# Patient Record
Sex: Female | Born: 1950 | Race: White | Hispanic: No | Smoking: Never smoker
Health system: Southern US, Community
[De-identification: ages and names within clinical notes are randomized; demographics above are authoritative.]

## PROBLEM LIST (undated history)

## (undated) DIAGNOSIS — R011 Cardiac murmur, unspecified: Secondary | ICD-10-CM

## (undated) DIAGNOSIS — E083593 Diabetes mellitus due to underlying condition with proliferative diabetic retinopathy without macular edema, bilateral: Secondary | ICD-10-CM

## (undated) DIAGNOSIS — I340 Nonrheumatic mitral (valve) insufficiency: Secondary | ICD-10-CM

## (undated) DIAGNOSIS — I214 Non-ST elevation (NSTEMI) myocardial infarction: Secondary | ICD-10-CM

## (undated) DIAGNOSIS — F419 Anxiety disorder, unspecified: Secondary | ICD-10-CM

## (undated) DIAGNOSIS — R7989 Other specified abnormal findings of blood chemistry: Secondary | ICD-10-CM

## (undated) DIAGNOSIS — I2510 Atherosclerotic heart disease of native coronary artery without angina pectoris: Secondary | ICD-10-CM

## (undated) DIAGNOSIS — E11319 Type 2 diabetes mellitus with unspecified diabetic retinopathy without macular edema: Secondary | ICD-10-CM

## (undated) DIAGNOSIS — Z9289 Personal history of other medical treatment: Secondary | ICD-10-CM

## (undated) DIAGNOSIS — Z87442 Personal history of urinary calculi: Secondary | ICD-10-CM

## (undated) DIAGNOSIS — E109 Type 1 diabetes mellitus without complications: Secondary | ICD-10-CM

## (undated) DIAGNOSIS — I82451 Acute embolism and thrombosis of right peroneal vein: Secondary | ICD-10-CM

## (undated) DIAGNOSIS — M1990 Unspecified osteoarthritis, unspecified site: Secondary | ICD-10-CM

## (undated) DIAGNOSIS — I447 Left bundle-branch block, unspecified: Secondary | ICD-10-CM

## (undated) DIAGNOSIS — H409 Unspecified glaucoma: Secondary | ICD-10-CM

## (undated) DIAGNOSIS — Z951 Presence of aortocoronary bypass graft: Secondary | ICD-10-CM

## (undated) DIAGNOSIS — M810 Age-related osteoporosis without current pathological fracture: Secondary | ICD-10-CM

## (undated) HISTORY — DX: Type 1 diabetes mellitus without complications: E10.9

## (undated) HISTORY — DX: Atherosclerotic heart disease of native coronary artery without angina pectoris: I25.10

## (undated) HISTORY — DX: Non-ST elevation (NSTEMI) myocardial infarction: I21.4

## (undated) HISTORY — DX: Unspecified glaucoma: H40.9

## (undated) HISTORY — DX: Acute embolism and thrombosis of right peroneal vein: I82.451

## (undated) HISTORY — DX: Type 2 diabetes mellitus with unspecified diabetic retinopathy without macular edema: E11.319

## (undated) HISTORY — DX: Age-related osteoporosis without current pathological fracture: M81.0

## (undated) HISTORY — PX: CORONARY ARTERY BYPASS GRAFT: SHX141

## (undated) HISTORY — DX: Other specified abnormal findings of blood chemistry: R79.89

## (undated) HISTORY — DX: Diabetes mellitus due to underlying condition with proliferative diabetic retinopathy without macular edema, bilateral: E08.3593

## (undated) HISTORY — DX: Presence of aortocoronary bypass graft: Z95.1

## (undated) HISTORY — DX: Nonrheumatic mitral (valve) insufficiency: I34.0

---

## 1974-09-22 DIAGNOSIS — G629 Polyneuropathy, unspecified: Secondary | ICD-10-CM

## 1974-09-22 HISTORY — DX: Polyneuropathy, unspecified: G62.9

## 2010-09-22 DIAGNOSIS — Z951 Presence of aortocoronary bypass graft: Secondary | ICD-10-CM

## 2010-09-22 HISTORY — DX: Presence of aortocoronary bypass graft: Z95.1

## 2012-06-14 DIAGNOSIS — H35359 Cystoid macular degeneration, unspecified eye: Secondary | ICD-10-CM | POA: Insufficient documentation

## 2015-10-18 DIAGNOSIS — Z961 Presence of intraocular lens: Secondary | ICD-10-CM | POA: Insufficient documentation

## 2018-03-01 DIAGNOSIS — H04123 Dry eye syndrome of bilateral lacrimal glands: Secondary | ICD-10-CM | POA: Insufficient documentation

## 2019-09-23 DIAGNOSIS — E1110 Type 2 diabetes mellitus with ketoacidosis without coma: Secondary | ICD-10-CM

## 2019-09-23 HISTORY — DX: Type 2 diabetes mellitus with ketoacidosis without coma: E11.10

## 2019-10-31 DIAGNOSIS — H4010X0 Unspecified open-angle glaucoma, stage unspecified: Secondary | ICD-10-CM | POA: Insufficient documentation

## 2019-10-31 DIAGNOSIS — R0989 Other specified symptoms and signs involving the circulatory and respiratory systems: Secondary | ICD-10-CM | POA: Insufficient documentation

## 2019-10-31 DIAGNOSIS — I2510 Atherosclerotic heart disease of native coronary artery without angina pectoris: Secondary | ICD-10-CM | POA: Insufficient documentation

## 2020-07-18 DIAGNOSIS — Z794 Long term (current) use of insulin: Secondary | ICD-10-CM | POA: Insufficient documentation

## 2020-09-21 DIAGNOSIS — I214 Non-ST elevation (NSTEMI) myocardial infarction: Secondary | ICD-10-CM

## 2020-09-21 HISTORY — DX: Non-ST elevation (NSTEMI) myocardial infarction: I21.4

## 2020-10-05 DIAGNOSIS — I82451 Acute embolism and thrombosis of right peroneal vein: Secondary | ICD-10-CM

## 2020-10-05 HISTORY — DX: Acute embolism and thrombosis of right peroneal vein: I82.451

## 2020-10-17 DIAGNOSIS — I82451 Acute embolism and thrombosis of right peroneal vein: Secondary | ICD-10-CM | POA: Insufficient documentation

## 2021-01-17 DIAGNOSIS — I4729 Other ventricular tachycardia: Secondary | ICD-10-CM | POA: Insufficient documentation

## 2021-03-18 ENCOUNTER — Encounter: Payer: Self-pay | Admitting: Internal Medicine

## 2021-03-18 ENCOUNTER — Non-Acute Institutional Stay: Payer: Medicare Other | Admitting: Internal Medicine

## 2021-03-18 DIAGNOSIS — I214 Non-ST elevation (NSTEMI) myocardial infarction: Secondary | ICD-10-CM | POA: Diagnosis not present

## 2021-03-18 DIAGNOSIS — I255 Ischemic cardiomyopathy: Secondary | ICD-10-CM | POA: Diagnosis not present

## 2021-03-18 DIAGNOSIS — E1021 Type 1 diabetes mellitus with diabetic nephropathy: Secondary | ICD-10-CM

## 2021-03-18 DIAGNOSIS — E1059 Type 1 diabetes mellitus with other circulatory complications: Secondary | ICD-10-CM

## 2021-03-18 DIAGNOSIS — H401132 Primary open-angle glaucoma, bilateral, moderate stage: Secondary | ICD-10-CM

## 2021-03-18 DIAGNOSIS — E785 Hyperlipidemia, unspecified: Secondary | ICD-10-CM

## 2021-03-18 DIAGNOSIS — M810 Age-related osteoporosis without current pathological fracture: Secondary | ICD-10-CM

## 2021-03-18 NOTE — Progress Notes (Signed)
Location:   [facility] of Service:  ALF [phone]) Provider:  Einar Crow MD  No primary care provider on file.  Caitlin Sharp Care Team: [name], MD as Referring Physician (Ophthalmology) Beverely Pace, [name], MD as Referring Physician (Cardiology) Bertram Gala, MD as Referring Physician (Cardiology)  Extended Emergency Contact Information Primary Emergency Contact: [name] Address: [address]          [place], [place] [address] [name] of [place] Home Phone: [phone] Mobile Phone: [phone] Relation: Son Preferred language: English Interpreter needed? No  Code Status:  Full Code Goals of care: Advanced Directive information No flowsheet data found.   Chief Complaint  Caitlin Sharp presents with   Acute Visit    Establish care    HPI:  Caitlin Sharp is a 70 y.o. female seen today for an acute visit for Establish care and Dizziness  Was admitted in the hospital [place] in Jan of this year with DKA, NSTEMI, Enterococcus Bacremia, Acute DVT Cath no Stenosis Medical Management EF 40%  H/o Type 1 Diabetes For past 50 years. Has been on Pump but now Long Acting Insulin and Humalog now CAD S/o CABG in 2012 osteoporosis 2021 -2.7 Glaucoma with Poor Vision  Active issues today Dizziness Says she feels Dizzy when stands up. Feels like she is going to pass out Is Off Metoprolol right now which was new med for her since 01/22 Also her Lisinopril was increased from 2.5 mg to 10 mg High CBG CBG running high specially in the evenings sometimes more then 400 She says it is due to Food and how she cannot anticipate  She keeps her injections and manges her Shots with meals herself depending on what she is going to eat   Was living in home before this move One son in [place] and other is in [place] to avoid Hypoglycemic episodes Does not drive due to her Vision Otherwise independent in her ADLS No Falls. Walks with  noassists    Past Medical History:  Diagnosis Date   Abnormal thyroid blood test    Per Records recevied from [facility], Dr.Pearson   Acute deep vein thrombosis of right peroneal vein (HCC)    Per Records recevied from [facility], Dr.Pearson   Coronary artery disease    Per [facility] New Caitlin Sharp Packet   Diabetes type I [facility])    Per [facility] New Caitlin Sharp Packet   Diabetic retinopathy (HCC)    Per [facility] New Caitlin Sharp Packet   DKA (diabetic ketoacidosis) (HCC) 2021   Dr.William Zackowski   Glaucoma    Per [facility] New Caitlin Sharp Packet   H/O heart bypass surgery 2012   Dr. 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 Aquilla Hacker, Per [facility] New Caitlin Sharp Packet   CESAREAN SECTION  [phone]   Dr.  Aquilla Hacker, Per [facility] New Caitlin Sharp Packet    Allergies  Allergen Reactions   Penicillins     Per [facility] New Caitlin Sharp Packet    Allergies as of 03/18/2021       Reactions   Penicillins    Per [facility] New Caitlin Sharp Packet        Medication List        Accurate as of March 18, 2021 10:47 AM. If you have any questions, ask your nurse or doctor.          Accu-Chek Softclix Lancets lancets USE 1 TO CHECK GLUCOSE THREE TIMES DAILY   aspirin EC 81 MG tablet Take 81 mg by mouth daily. Swallow whole.   atorvastatin 40 MG tablet Commonly known as: LIPITOR Take 40 mg by mouth daily.   brimonidine 0.2 % ophthalmic solution Commonly  known as: ALPHAGAN Place 1 drop into both eyes 3 (three) times daily.   CALCIUM-CARB 600 PO Take 1 tablet by mouth daily.   clopidogrel 75 MG tablet Commonly known as: PLAVIX Take 75 mg by mouth daily.   furosemide 20 MG tablet Commonly known as: LASIX Take 20 mg by mouth daily.   glucosamine-chondroitin 500-400 MG tablet Take 1 tablet by mouth every morning.   glucose blood test strip 1 each by Other route as needed for other. Accu-chek   Gvoke HypoPen 1-Pack 1 MG/0.2ML Soaj Generic drug: Glucagon Inject 0.2 mLs into the skin as needed.   Hair/Skin/Nails Tabs Take 1 tablet by mouth daily at 12 noon.   insulin lispro 100 UNIT/ML KwikPen Commonly known as: HUMALOG Inject into the skin 3 (three) times daily. Up to 15 units   Lantus SoloStar 100 UNIT/ML Solostar Pen Generic drug: insulin glargine Inject 12 Units into the skin at bedtime.   latanoprost 0.005 % ophthalmic solution Commonly known as: XALATAN Place 1 drop into both eyes at bedtime.   lisinopril 10 MG tablet Commonly known as: ZESTRIL Take 10 mg by mouth daily.   metoprolol succinate 50 MG 24 hr tablet Commonly known as: TOPROL-XL Take 50 mg by mouth daily. Take with or immediately following a meal.   MULTIVITAMIN ADULT PO Take 1 tablet by mouth daily.   nitroGLYCERIN 0.4 MG SL tablet Commonly known as: NITROSTAT Place 0.4 mg under the tongue every 5 (five) minutes as needed for chest pain.   Pen Needles 3/16" 31G X 5 MM Misc 1 Device by Does not apply route 5 (five) times daily. With insulin administration   sennosides-docusate sodium 8.6-50 MG tablet Commonly known as: SENOKOT-S Take 1 tablet by mouth every other day.   timolol 0.5 % ophthalmic solution Commonly known as: BETIMOL Place 1 drop into the left eye 2 (two) times daily.   Vitamin D3 50 MCG (2000 UT) Tabs Take 1 tablet by mouth daily.        Review of Systems  Constitutional: Negative.   HENT: Negative.    Respiratory:  Negative.    Cardiovascular: Negative.   Gastrointestinal:  Positive for constipation.  Genitourinary: Negative.   Musculoskeletal: Negative.   Skin: Negative.   Neurological:  Positive for dizziness.  Psychiatric/Behavioral: Negative.     Immunization History  Administered Date(s) Administered   Influenza, High Dose Seasonal PF 09/01/2005   PFIZER(Purple Top)SARS-COV-2 Vaccination 12/02/2019, 12/26/2019   Tdap 07/11/2014   Pertinent  Health Maintenance Due  Topic Date Due   COLONOSCOPY (Pts 45-43yrs Insurance coverage will need to be confirmed)  Never done   MAMMOGRAM  Never  done   DEXA SCAN  Never done   PNA vac Low Risk Adult (1 of 2 - PCV13) Never done   INFLUENZA VACCINE  04/22/2021   No flowsheet data found. Functional Status Survey:    Vitals:   03/18/21 1038  BP: 117/62  Pulse: 69  Resp: 18  Temp: 97.7 F (36.5 C)  SpO2: 98%  Weight: 126 lb 3.2 oz (57.2 kg)   There is no height or weight on file to calculate BMI. Physical Exam Constitutional: Oriented to person, place, and time. Well-developed and well-nourished.  HENT:  Head: Normocephalic.  Mouth/Throat: Oropharynx is clear and moist.  Eyes: Pupils are equal, round, and reactive to light.  Neck: Neck supple.  Cardiovascular: Normal rate and normal heart sounds.  No murmur heard. Pulmonary/Chest: Effort normal and breath sounds normal. No respiratory distress. No wheezes. She has no rales.  Abdominal: Soft. Bowel sounds are normal. No distension. There is no tenderness. There is no rebound.  Musculoskeletal: No edema.  Lymphadenopathy: none Neurological: Alert and oriented to person, place, and time.  No Focal Deficits Gait was stable Skin: Skin is warm and dry.  Psychiatric: Normal mood and affect. Behavior is normal. Thought content normal.   Labs reviewed: No results for input(s): NA, K, CL, CO2, GLUCOSE, BUN, CREATININE, CALCIUM, MG, PHOS in the last 8760 hours. No results for input(s): AST,  ALT, ALKPHOS, BILITOT, PROT, ALBUMIN in the last 8760 hours. No results for input(s): WBC, NEUTROABS, HGB, HCT, MCV, PLT in the last 8760 hours. No results found for: TSH No results found for: HGBA1C No results found for: CHOL, HDL, LDLCALC, LDLDIRECT, TRIG, CHOLHDL  Significant Diagnostic Results in last 30 days:  No results found.  Assessment/Plan  Type 1 diabetes mellitus with other circulatory complication (HCC) On Lantus and Humalog with Meals Fasting sugars 95-135 Some sugars in the evening more then 350 Having issues to control due to unpredictable Diet in facility Managing by herself her Insulin Has Free Liber Has Appointment with her Endocrinologist in [facility]  NSTEMI (non-ST elevated myocardial infarction) (HCC)in Jan in setting of DKA Per Notes from her PCP Is on Dual Therapy Cardiac Cath was negative Needs follow up with her Cardiology  Dizziness With SBP running in 90 Reduce Lisinopril to 2.5 mg Qd Her baseline dose She is on Lisinopril for 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

## 2021-03-19 LAB — CBC AND DIFFERENTIAL
HCT: 32 — AB (ref 36–46)
Hemoglobin: 11.4 — AB (ref 12.0–16.0)
Platelets: 280 (ref 150–399)
WBC: 5.4

## 2021-03-19 LAB — CBC: RBC: 3.58 — AB (ref 3.87–5.11)

## 2021-03-19 LAB — COMPREHENSIVE METABOLIC PANEL
Albumin: 4.4 (ref 3.5–5.0)
Calcium: 9.8 (ref 8.7–10.7)
Globulin: 2.7

## 2021-03-19 LAB — BASIC METABOLIC PANEL
BUN: 35 — AB (ref 4–21)
CO2: 27 — AB (ref 13–22)
Chloride: 101 (ref 99–108)
Creatinine: 1 (ref 0.5–1.1)
Glucose: 164
Potassium: 4.1 (ref 3.4–5.3)
Sodium: 142 (ref 137–147)

## 2021-03-19 LAB — LIPID PANEL
Cholesterol: 179 (ref 0–200)
HDL: 83 — AB (ref 35–70)
LDL Cholesterol: 86
LDl/HDL Ratio: 2.2
Triglycerides: 52 (ref 40–160)

## 2021-03-19 LAB — HEPATIC FUNCTION PANEL
ALT: 24 (ref 7–35)
AST: 29 (ref 13–35)
Alkaline Phosphatase: 99 (ref 25–125)
Bilirubin, Total: 0.2

## 2021-03-19 LAB — HEMOGLOBIN A1C: Hemoglobin A1C: 9.4

## 2021-03-26 ENCOUNTER — Encounter: Payer: Self-pay | Admitting: Orthopedic Surgery

## 2021-03-26 ENCOUNTER — Non-Acute Institutional Stay: Payer: Medicare Other | Admitting: Orthopedic Surgery

## 2021-03-26 DIAGNOSIS — R42 Dizziness and giddiness: Secondary | ICD-10-CM

## 2021-03-26 NOTE — Addendum Note (Signed)
Addended byHazle Nordmann E on: 03/26/2021 08:53 PM   Modules accepted: Level of Service

## 2021-03-26 NOTE — Progress Notes (Addendum)
Location:   [facility] Room Number: 620 Place of Service:  ALF [phone]) Provider:  Hazle Nordmann, NP  No primary care provider on file.  Patient Care Team: [name], MD as Referring Physician (Ophthalmology) Beverely Pace, [name], MD as Referring Physician (Cardiology) Bertram Gala, MD as Referring Physician (Cardiology)  Extended Emergency Contact Information Primary Emergency Contact: [name] Address: [address]          [place], [place] [address] [name] of [place] Home Phone: [phone] Mobile Phone: [phone] Relation: Son Preferred language: English Interpreter needed? No  Code Status:  Full Code Goals of care: Advanced Directive information No flowsheet data found.   Chief Complaint  Patient presents with   Acute Visit    Dizziness    HPI:  Pt is a 70 y.o. female seen today for an acute visit due to dizziness.   She currently resides on the assisted living unit at [facility]. Past medical history includes: type 1 diabetes, proteinuria, NSTEMI, cardiomyopathy, glaucoma, hyperlipidemia, DVT, and osteoporosis.   Since seeing Dr. Chales Abrahams 06/27, she complains of ongoing dizziness. She fell yesterday at the nurses station while waiting to receive her eye drops. Reports feeling dizziness before she fell. She has been advised to stand for a few seconds and then move. No recent injuries have occurred. Metoprolol recently discontinued. Lisinopril was reduced from 10 mg to 2.5 mg due to proteinuria. She continues to take lasix 20 mg daily.   She is followed by cardiology Dr. Bertram Gala,  unknown when her next follow up is.   Recent blood pressures are as follows:  07/04- 84/57 (standing), 105/64 (sitting)  07/03- 109/49  06/26- 117/62, 90/67  Nurse does not report any concerns, vitals stable.   Plan of care discussed with son, Harrold Donath. He shares his mother took a long walk with him yesterday prior to falling.  Wondering if she may be dehydrated. Reports she does not drink enough water daily.      Past Medical History:  Diagnosis Date   Abnormal thyroid blood test    Per Records recevied from [facility], Dr.Pearson   Acute deep vein thrombosis of right peroneal vein (HCC)    Per Records recevied from [facility], Dr.Pearson   Coronary artery disease    Per [facility] New Patient Packet   Diabetes type I [facility])    Per [facility] New Patient Packet   Diabetic retinopathy (HCC)    [facility] New Patient Packet   DKA (diabetic ketoacidosis) (HCC) 2021   Dr.William Zackowski   Glaucoma    Per [facility] New Patient Packet   H/O heart bypass surgery 2012   Dr. 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 Aquilla Hacker, Per [facility] New Patient Packet   CESAREAN SECTION  1973   Dr. Renae Fickle  [place], Per [facility] New Patient Packet    Allergies  Allergen Reactions   Penicillins     Per [facility] New Patient Packet    Allergies as of 03/26/2021       Reactions   Penicillins    Per [facility] New Patient Packet        Medication List        Accurate as of March 26, 2021 11:19 AM. If you have any questions, ask your nurse or doctor.          Accu-Chek Softclix Lancets lancets USE 1 TO CHECK GLUCOSE THREE TIMES DAILY   aspirin EC 81 MG tablet Take 81 mg by mouth daily. Swallow whole.   atorvastatin 40 MG tablet Commonly known as: LIPITOR Take 40  mg by mouth daily.   brimonidine 0.2 % ophthalmic solution Commonly known as: ALPHAGAN Place 1 drop into both eyes 3 (three) times daily.   CALCIUM-CARB 600 PO Take 1 tablet by mouth daily.   clopidogrel 75 MG tablet Commonly known as: PLAVIX Take 75 mg by mouth daily.   furosemide 20 MG tablet Commonly known as: LASIX Take 20 mg by mouth daily.   glucosamine-chondroitin 500-400 MG tablet Take 1 tablet by mouth every morning.   glucose blood test strip 1 each by Other route as needed for other. Accu-chek   Gvoke HypoPen 1-Pack 1 MG/0.2ML Soaj Generic drug: Glucagon Inject 0.2 mLs into the skin as needed.   Hair/Skin/Nails Tabs Take 1 tablet by mouth daily at 12 noon.   insulin lispro 100 UNIT/ML KwikPen Commonly known as: HUMALOG Inject 2-10 Units into the skin 3 (three) times daily.   Lantus SoloStar 100 UNIT/ML Solostar Pen Generic drug: insulin glargine Inject 12 Units into the skin at bedtime.   latanoprost 0.005 % ophthalmic solution Commonly known as: XALATAN Place 1 drop into both eyes at bedtime.   lisinopril 2.5 MG tablet Commonly known as: ZESTRIL Take 2.5 mg by mouth daily.   MULTIVITAMIN ADULT PO Take 1 tablet by mouth daily.   nitroGLYCERIN 0.4 MG SL tablet Commonly known as: NITROSTAT Place 0.4 mg under the tongue every 5 (five) minutes as needed for chest pain.   Pen Needles 3/16" 31G X 5 MM Misc 1 Device by Does not apply route 5 (five) times daily. With insulin administration   sennosides-docusate sodium 8.6-50 MG tablet Commonly known as: SENOKOT-S Take 1 tablet by mouth every other day.   timolol 0.5 % ophthalmic solution Commonly known as: BETIMOL Place 1 drop into the left eye 2 (two) times daily.   Vitamin D3 125 MCG (5000 UT) Tabs Take 1 tablet by mouth daily.        Review of Systems  Constitutional:  Negative for activity change, fatigue and fever.  Respiratory:  Negative for cough, shortness of breath and wheezing.    Cardiovascular:  Negative for chest pain and leg swelling.  Gastrointestinal:  Negative for blood in stool.  Genitourinary:  Negative for hematuria.  Musculoskeletal:  Negative for gait problem.  Neurological:  Positive for dizziness, weakness and light-headedness.  Psychiatric/Behavioral:  Negative for dysphoric mood. The patient is not nervous/anxious.    Immunization History  Administered Date(s) Administered   Influenza, High Dose Seasonal PF 09/01/2005   PFIZER(Purple Top)SARS-COV-2 Vaccination 12/02/2019, 12/26/2019   Tdap 07/11/2014   Pertinent  Health Maintenance Due  Topic Date Due   COLONOSCOPY (Pts 45-65yrs Insurance coverage will need to be confirmed)  Never done   MAMMOGRAM  Never done  DEXA SCAN  Never done   PNA vac Low Risk Adult (1 of 2 - PCV13) Never done   INFLUENZA VACCINE  04/22/2021   No flowsheet data found. Functional Status Survey:    Vitals:   03/26/21 1113  BP: (!) 84/57  Pulse: 76  Temp: (!) 97.5 F (36.4 C)  SpO2: 98%  Weight: 125 lb 12.8 oz (57.1 kg)   There is no height or weight on file to calculate BMI. Physical Exam Vitals reviewed.  Constitutional:      General: She is not in acute distress. Neck:     Vascular: No carotid bruit.  Cardiovascular:     Rate and Rhythm: Normal rate and regular rhythm.     Pulses: Normal pulses.     Heart sounds: Normal heart sounds. No murmur heard. Pulmonary:     Effort: Pulmonary effort is normal. No respiratory distress.     Breath sounds: Normal breath sounds. No wheezing.  Musculoskeletal:     Right lower leg: No edema.     Left lower leg: No edema.  Skin:    General: Skin is warm and dry.     Capillary Refill: Capillary refill takes less than 2 seconds.  Neurological:     General: No focal deficit present.     Mental Status: She is alert and oriented to person, place, and time.  Psychiatric:        Mood and Affect: Mood normal.        Behavior: Behavior normal.    Labs  reviewed: Recent Labs    03/19/21 0000  NA 142  K 4.1  CL 101  CO2 27*  BUN 35*  CREATININE 1.0  CALCIUM 9.8   Recent Labs    03/19/21 0000  AST 29  ALT 24  ALKPHOS 99  ALBUMIN 4.4   Recent Labs    03/19/21 0000  WBC 5.4  HGB 11.4*  HCT 32*  PLT 280   No results found for: TSH Lab Results  Component Value Date   HGBA1C 9.4 03/19/2021   Lab Results  Component Value Date   CHOL 179 03/19/2021   HDL 83 (A) 03/19/2021   LDLCALC 86 03/19/2021   TRIG 52 03/19/2021    Significant Diagnostic Results in last 30 days:  No results found.  Assessment/Plan 1. Dizziness - hgb 11.4 03/19/2021, no signs of bleeding - metoprolol discontinued recently - lisinopril reduced to 2.5 mg daily - suspect she may be dehydrated with diuretic - recommend hydration throughout the day - recommend f/u with Dr. Karren Burly   - cont to take bp bid- please take AM bp 2 hours after receiving medications    Family/ staff Communication: plan discussed with patient, son and nurse  Labs/tests ordered:  none

## 2021-04-13 ENCOUNTER — Emergency Department (HOSPITAL_BASED_OUTPATIENT_CLINIC_OR_DEPARTMENT_OTHER): Payer: Medicare Other

## 2021-04-13 ENCOUNTER — Other Ambulatory Visit: Payer: Self-pay

## 2021-04-13 ENCOUNTER — Encounter (HOSPITAL_BASED_OUTPATIENT_CLINIC_OR_DEPARTMENT_OTHER): Payer: Self-pay | Admitting: Urology

## 2021-04-13 ENCOUNTER — Emergency Department (HOSPITAL_BASED_OUTPATIENT_CLINIC_OR_DEPARTMENT_OTHER)
Admission: EM | Admit: 2021-04-13 | Discharge: 2021-04-13 | Disposition: A | Discharge status: Home / Self Care | Payer: Medicare Other | Attending: Emergency Medicine | Admitting: Emergency Medicine

## 2021-04-13 ENCOUNTER — Telehealth: Payer: Self-pay | Admitting: Adult Health

## 2021-04-13 DIAGNOSIS — Z951 Presence of aortocoronary bypass graft: Secondary | ICD-10-CM | POA: Diagnosis not present

## 2021-04-13 DIAGNOSIS — L03031 Cellulitis of right toe: Secondary | ICD-10-CM | POA: Insufficient documentation

## 2021-04-13 DIAGNOSIS — Z794 Long term (current) use of insulin: Secondary | ICD-10-CM | POA: Insufficient documentation

## 2021-04-13 DIAGNOSIS — Z7982 Long term (current) use of aspirin: Secondary | ICD-10-CM | POA: Insufficient documentation

## 2021-04-13 DIAGNOSIS — E10319 Type 1 diabetes mellitus with unspecified diabetic retinopathy without macular edema: Secondary | ICD-10-CM | POA: Diagnosis not present

## 2021-04-13 DIAGNOSIS — I2510 Atherosclerotic heart disease of native coronary artery without angina pectoris: Secondary | ICD-10-CM | POA: Insufficient documentation

## 2021-04-13 DIAGNOSIS — M86171 Other acute osteomyelitis, right ankle and foot: Secondary | ICD-10-CM | POA: Diagnosis not present

## 2021-04-13 DIAGNOSIS — L97514 Non-pressure chronic ulcer of other part of right foot with necrosis of bone: Secondary | ICD-10-CM | POA: Diagnosis not present

## 2021-04-13 LAB — CBC WITH DIFFERENTIAL/PLATELET
Abs Immature Granulocytes: 0.01 10*3/uL (ref 0.00–0.07)
Basophils Absolute: 0 10*3/uL (ref 0.0–0.1)
Basophils Relative: 1 %
Eosinophils Absolute: 0.3 10*3/uL (ref 0.0–0.5)
Eosinophils Relative: 3 %
HCT: 32 % — ABNORMAL LOW (ref 36.0–46.0)
Hemoglobin: 10.7 g/dL — ABNORMAL LOW (ref 12.0–15.0)
Immature Granulocytes: 0 %
Lymphocytes Relative: 28 %
Lymphs Abs: 2.1 10*3/uL (ref 0.7–4.0)
MCH: 30.4 pg (ref 26.0–34.0)
MCHC: 33.4 g/dL (ref 30.0–36.0)
MCV: 90.9 fL (ref 80.0–100.0)
Monocytes Absolute: 0.8 10*3/uL (ref 0.1–1.0)
Monocytes Relative: 10 %
Neutro Abs: 4.4 10*3/uL (ref 1.7–7.7)
Neutrophils Relative %: 58 %
Platelets: 331 10*3/uL (ref 150–400)
RBC: 3.52 MIL/uL — ABNORMAL LOW (ref 3.87–5.11)
RDW: 12.6 % (ref 11.5–15.5)
WBC: 7.5 10*3/uL (ref 4.0–10.5)
nRBC: 0 % (ref 0.0–0.2)

## 2021-04-13 LAB — COMPREHENSIVE METABOLIC PANEL
ALT: 9 U/L (ref 0–44)
AST: 19 U/L (ref 15–41)
Albumin: 3.8 g/dL (ref 3.5–5.0)
Alkaline Phosphatase: 79 U/L (ref 38–126)
Anion gap: 11 (ref 5–15)
BUN: 31 mg/dL — ABNORMAL HIGH (ref 8–23)
CO2: 26 mmol/L (ref 22–32)
Calcium: 9.5 mg/dL (ref 8.9–10.3)
Chloride: 100 mmol/L (ref 98–111)
Creatinine, Ser: 1.03 mg/dL — ABNORMAL HIGH (ref 0.44–1.00)
GFR, Estimated: 58 mL/min — ABNORMAL LOW (ref >60)
Glucose, Bld: 155 mg/dL — ABNORMAL HIGH (ref 70–99)
Potassium: 4.1 mmol/L (ref 3.5–5.1)
Sodium: 137 mmol/L (ref 135–145)
Total Bilirubin: 0.4 mg/dL (ref 0.3–1.2)
Total Protein: 7.3 g/dL (ref 6.5–8.1)

## 2021-04-13 LAB — LACTIC ACID, PLASMA: Lactic Acid, Venous: 1.1 mmol/L (ref 0.5–1.9)

## 2021-04-13 MED ORDER — DOXYCYCLINE HYCLATE 100 MG PO CAPS: 100.0000 mg | ORAL_CAPSULE | Freq: Two times a day (BID) | ORAL | 0 refills | Status: DC

## 2021-04-13 MED ORDER — SODIUM CHLORIDE 0.9 % IV SOLN
2.0000 g | Freq: Once | INTRAVENOUS | Status: AC
  Administered 2021-04-13: 2 g via INTRAVENOUS
  Filled 2021-04-13: qty 2

## 2021-04-13 MED ORDER — VANCOMYCIN HCL 750 MG/150ML IV SOLN
750.0000 mg | INTRAVENOUS | Status: DC
  Filled 2021-04-13: qty 150

## 2021-04-13 MED ORDER — VANCOMYCIN HCL IN DEXTROSE 1-5 GM/200ML-% IV SOLN
1000.0000 mg | Freq: Once | INTRAVENOUS | Status: AC
  Administered 2021-04-13: 1000 mg via INTRAVENOUS
  Filled 2021-04-13: qty 200

## 2021-04-13 MED ORDER — SODIUM CHLORIDE 0.9 % IV SOLN: 2.0000 g | Freq: Two times a day (BID) | INTRAVENOUS | Status: DC

## 2021-04-13 MED ORDER — DOXYCYCLINE HYCLATE 100 MG PO CAPS
100.0000 mg | ORAL_CAPSULE | Freq: Two times a day (BID) | ORAL | 0 refills | Status: DC
Start: 2021-04-13 — End: 2021-04-23

## 2021-04-13 MED ORDER — SODIUM CHLORIDE 0.9 % IV SOLN
INTRAVENOUS | Status: DC | PRN
  Administered 2021-04-13: 500 mL via INTRAVENOUS

## 2021-04-13 MED ORDER — METRONIDAZOLE 500 MG/100ML IV SOLN
500.0000 mg | Freq: Once | INTRAVENOUS | Status: AC
  Administered 2021-04-13: 500 mg via INTRAVENOUS
  Filled 2021-04-13: qty 100

## 2021-04-13 NOTE — ED Notes (Signed)
Pt denies n/v/d. Pt denies trauma. Pt states wound "popped" up about 2 weeks ago.

## 2021-04-13 NOTE — Telephone Encounter (Signed)
Nurse [name] called from [facility] to report that Ms. B has a wound on her toe that is black with spreading redness and swelling up the foot. She does not have a fever and the area is not purulent. She is a Type 1 diabetic.  After a discussion with Dr. Chales Abrahams we feel it would be best if she goes to the ED to receive an evaluation and possible IV antibiotics along with possible debridement and referral to wound care if necessary depending upon their assessment.

## 2021-04-13 NOTE — ED Triage Notes (Signed)
Right great toe wound, purulent drainage and necrotic tissue noted, h/o DM.

## 2021-04-13 NOTE — Progress Notes (Signed)
Pharmacy Antibiotic Note  Caitlin Sharp is a 70 y.o. female admitted on 04/13/2021 presenting with necrotic toe wound with purulent drainage.  Pharmacy has been consulted for Vancomycin and cefepime dosing.  Plan: Vancomycin 1000 mg IV x 1, then 750 mg IV q 24h (eAUC 437, Goal AUC 400-550, SCr 1.03) Cefepime 2g IV q 12h Monitor renal function, Cx and clinical progression to narrow Vancomycin levels as needed  Height: 5\' 7"  (170.2 cm) Weight: 57.1 kg (125 lb 12.8 oz) IBW/kg (Calculated) : 61.6  Temp (24hrs), Avg:98.1 F (36.7 C), Min:98.1 F (36.7 C), Max:98.1 F (36.7 C)  Recent Labs  Lab 04/13/21 1825  WBC 7.5  CREATININE 1.03*  LATICACIDVEN 1.1    Estimated Creatinine Clearance: 45.2 mL/min (A) (by C-G formula based on SCr of 1.03 mg/dL (H)).    Allergies  Allergen Reactions   Penicillins     Per [facility] New Patient Packet    [facility], PharmD Clinical Pharmacist ED Pharmacist Phone # [phone] 04/13/2021 7:52 PM

## 2021-04-14 NOTE — ED Provider Notes (Signed)
[facility] EMERGENCY DEPT Provider Note   CSN: [id number] Arrival date & time: 04/13/21  1533     History Chief Complaint  Patient presents with   Wound Infection    Caitlin Sharp is a 70 y.o. female.  HPI     70yo female with history of DVT, CAD, DM type 1, diabetic retinopathy, present with concern for right great toe wound.   Reports she developed a blister 2 weeks ago she believes from ill-fitting shoes and walking  Has had some mild pain over the area. Hs not noticed and drainage of purulence. Today, it suddenly appeared worse, black in appearance, more painful.  Denis fevers, nausea, vomiting, fatigue or systemic symptoms. No hx of prior foot infections.   Past Medical History:  Diagnosis Date   Abnormal thyroid blood test    Per Records recevied from [facility], Dr.Pearson   Acute deep vein thrombosis of right peroneal vein (HCC)    Per Records recevied from [facility], Dr.Pearson   Coronary artery disease    Per [facility] New Patient Packet   Diabetes type I [facility])    Per [facility] New Patient Packet   Diabetic retinopathy (HCC)    Per [facility] New Patient Packet   DKA (diabetic ketoacidosis) (HCC) 2021   Dr.William Zackowski   Glaucoma    Per [facility] New Patient Packet   H/O heart bypass surgery 2012   Dr. 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   Past Surgical History:  Procedure Laterality Date    CESAREAN SECTION  1970   Dr. Aquilla Hacker, Per [facility] New Patient Packet   CESAREAN SECTION  1973   Dr. Aquilla Hacker, Per [facility] New Patient Packet     OB History   No obstetric history on file.     Family History  Problem Relation Age of Onset   Congestive Heart Failure Mother        Per [facility] New Patient Packet   Arthritis Mother        Per [facility] New Patient Packet   Osteoporosis Mother        Per [facility] New Patient Packet   Heart attack Father        Per [facility] New Patient Packet   Diabetes Sister        Per [facility] New Patient Packet   Emphysema Brother        Per [facility] New Patient Packet   Heart disease Brother        Per [facility] New Patient Packet    Social History   Tobacco Use   Smoking status: Never   Smokeless tobacco: Never  Vaping Use   Vaping Use: Never used  Substance Use Topics   Alcohol use: Never   Drug use: Never    Home Medications Prior to Admission medications   Medication Sig  Start Date End Date Taking? Authorizing Provider  aspirin EC 81 MG tablet Take 81 mg by mouth daily. Swallow whole.   Yes [provider]  atorvastatin (LIPITOR) 40 MG tablet Take 40 mg by mouth daily.   Yes [provider]  brimonidine (ALPHAGAN) 0.2 % ophthalmic solution Place 1 drop into both eyes 3 (three) times daily.   Yes [provider]  Cholecalciferol (VITAMIN D3) 125 MCG (5000 UT) TABS Take 1 tablet by mouth daily.   Yes [provider]  clopidogrel (PLAVIX) 75 MG tablet Take 75 mg by mouth daily.   Yes [provider]  furosemide (LASIX) 20 MG tablet Take 20 mg by mouth daily.   Yes [provider]  Glucagon (GVOKE HYPOPEN 1-PACK) 1 MG/0.2ML SOAJ Inject 0.2 mLs into the skin as needed.   Yes [provider]  glucosamine-chondroitin 500-400 MG tablet Take 1 tablet by mouth every morning.   Yes [provider]  glucose blood test strip 1 each by Other route as needed for other. Accu-chek   Yes [provider]   insulin glargine (LANTUS SOLOSTAR) 100 UNIT/ML Solostar Pen Inject 12 Units into the skin at bedtime.   Yes [provider]  insulin lispro (HUMALOG) 100 UNIT/ML KwikPen Inject 2-10 Units into the skin 3 (three) times daily.   Yes [provider]  Insulin Pen Needle (PEN NEEDLES 3/16") 31G X 5 MM MISC 1 Device by Does not apply route 5 (five) times daily. With insulin administration   Yes [provider]  latanoprost (XALATAN) 0.005 % ophthalmic solution Place 1 drop into both eyes at bedtime.   Yes [provider]  lisinopril (ZESTRIL) 2.5 MG tablet Take 2.5 mg by mouth daily.   Yes [provider]  Multiple Vitamin (MULTIVITAMIN ADULT PO) Take 1 tablet by mouth daily.   Yes [provider]  Multiple Vitamins-Minerals (HAIR/SKIN/NAILS) TABS Take 1 tablet by mouth daily at 12 noon.   Yes [provider]  sennosides-docusate sodium (SENOKOT-S) 8.6-50 MG tablet Take 1 tablet by mouth every other day.   Yes [provider]  timolol (BETIMOL) 0.5 % ophthalmic solution Place 1 drop into the left eye 2 (two) times daily.   Yes [provider]  Accu-Chek Softclix Lancets lancets USE 1 TO CHECK GLUCOSE THREE TIMES DAILY 10/12/20   [provider]  Calcium Carbonate (CALCIUM-CARB 600 PO) Take 1 tablet by mouth daily.    [provider]  doxycycline (VIBRAMYCIN) 100 MG capsule Take 1 capsule (100 mg total) by mouth 2 (two) times daily. 04/13/21   Alvira Monday, MD  nitroGLYCERIN (NITROSTAT) 0.4 MG SL tablet Place 0.4 mg under the tongue every 5 (five) minutes as needed for chest pain.    [provider]    Allergies    Penicillins  Review of Systems   Review of Systems  Constitutional:  Negative for appetite change, fatigue and fever.  Eyes:  Negative for visual disturbance.  Respiratory:  Negative for cough.   Gastrointestinal:  Positive for diarrhea. Negative for abdominal pain, nausea and  vomiting.  Musculoskeletal:  Positive for arthralgias.  Skin:  Positive for color change, rash and wound.  Neurological:  Negative for light-headedness and headaches.   Physical Exam Updated Vital Signs BP (!) 159/62 (BP Location: Right Arm)   Pulse 76   Temp 98.1 F (36.7 C)   Resp 18   Ht 5\' 7"  (1.702 m)   Wt 57.1 kg   SpO2 100%  BMI 19.70 kg/m   Physical Exam Vitals and nursing note reviewed.  Constitutional:      General: She is not in acute distress.    Appearance: She is well-developed. She is not diaphoretic.  HENT:     Head: Normocephalic and atraumatic.  Eyes:     Conjunctiva/sclera: Conjunctivae normal.  Cardiovascular:     Rate and Rhythm: Normal rate and regular rhythm.     Heart sounds: Normal heart sounds. No murmur heard.   No friction rub. No gallop.  Pulmonary:     Effort: Pulmonary effort is normal. No respiratory distress.     Breath sounds: Normal breath sounds. No wheezing or rales.  Abdominal:     General: There is no distension.     Palpations: Abdomen is soft.     Tenderness: There is no abdominal tenderness. There is no guarding.  Musculoskeletal:        General: No tenderness.     Cervical back: Normal range of motion.     Comments: Doppler signal easily found ,normal cap refill  Skin:    General: Skin is warm and dry.     Findings: Erythema (see photo,balck necrotic distal toe, unable to express purulence, erythema of great toe, mild extension onto dorsum of foot) present. No rash.  Neurological:     Mental Status: She is alert and oriented to person, place, and time.       ED Results / Procedures / Treatments   Labs (all labs ordered are listed, but only abnormal results are displayed) Labs Reviewed  CBC WITH DIFFERENTIAL/PLATELET - Abnormal; Notable for the following components:      Result Value   RBC 3.52 (*)    Hemoglobin 10.7 (*)    HCT 32.0 (*)    All other components within normal limits  COMPREHENSIVE METABOLIC PANEL -  Abnormal; Notable for the following components:   Glucose, Bld 155 (*)    BUN 31 (*)    Creatinine, Ser 1.03 (*)    GFR, Estimated 58 (*)    All other components within normal limits  CULTURE, BLOOD (ROUTINE X 2)  CULTURE, BLOOD (ROUTINE X 2)  LACTIC ACID, PLASMA    EKG None  Radiology DG Foot Complete Right  Result Date: 04/13/2021 CLINICAL DATA:  Right great toe wound with. Early drainage. Diabetic patient. EXAM: RIGHT FOOT COMPLETE - 3+ VIEW COMPARISON:  None. FINDINGS: There is resorption of portions of the distal tuft of the right great toe consistent with osteomyelitis. No fracture.  No bone lesion.  Joints are normally aligned. Soft tissue swelling is noted of the distal great toe. Tiny bubbles of air are seen along the distal aspect of the great toe adjacent to the partly resorbed distal tuft. IMPRESSION: 1. Osteomyelitis of the distal tuft of the right great toe with surrounding soft tissue swelling and a few tiny bubbles of soft tissue air. Electronically Signed   By: Amie Portland M.D.   On: 04/13/2021 18:40    Procedures Procedures   Medications Ordered in ED Medications  metroNIDAZOLE (FLAGYL) IVPB 500 mg (0 mg Intravenous Stopped 04/13/21 2129)  ceFEPIme (MAXIPIME) 2 g in sodium chloride 0.9 % 100 mL IVPB (0 g Intravenous Stopped 04/13/21 1925)  vancomycin (VANCOCIN) IVPB 1000 mg/200 mL premix (0 mg Intravenous Stopped 04/13/21 2129)    ED Course  I have reviewed the triage vital signs and the nursing notes.  Pertinent labs & imaging results that were available during my care of the  patient were reviewed by me and considered in my medical decision making (see chart for details).    MDM Rules/Calculators/A&P                            70yo female with history of DVT, CAD, DM type 1, diabetic retinopathy, present with concern for right great toe wound.   Hemodynamically stable, no systemic symptoms, no leukocytosis, normal lactic acid, no signs of sepsis.    Has  necrotic foot wound with osteomyelitis of distal tuft of right great toe.  Mild cellulitis of toe and erythema of part of dorsum off oot which she reports has been there chronically.  Discussed with Orthopedics on call, Dr. August Saucer.   Gave vancomycin, cefepime and flagyl for diabetic wound infection/osteomyelitis.  Discussed possible admission vs close outpatient follow up the day after tomorrow with Dr. Lajoyce Corners and strict return precautions with patient and Dr. August Saucer. She has not yet been on antibiotics prior to this evaluation. Given her stability, no sepsis, mild redness, feel it is not unreasonable for her to receive IV antibiotics tonight with oral abx tomorrow and seeing Dr. Lajoyce Corners on Monday.  Outlined area of erythema and gave strict return precautions with very low threshold for return and inpatient abx and admission.  Patient discharged in stable condition with understanding of reasons to return.   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

## 2021-04-15 ENCOUNTER — Ambulatory Visit: Payer: Medicare Other | Admitting: Orthopedic Surgery

## 2021-04-15 DIAGNOSIS — M869 Osteomyelitis, unspecified: Secondary | ICD-10-CM

## 2021-04-16 ENCOUNTER — Other Ambulatory Visit: Payer: Self-pay

## 2021-04-16 ENCOUNTER — Encounter (HOSPITAL_COMMUNITY): Payer: Self-pay | Admitting: Orthopedic Surgery

## 2021-04-16 ENCOUNTER — Encounter: Payer: Self-pay | Admitting: Orthopedic Surgery

## 2021-04-16 NOTE — Anesthesia Preprocedure Evaluation (Addendum)
Anesthesia Evaluation  Patient identified by MRN, date of birth, ID band Patient awake    Reviewed: Allergy & Precautions, NPO status , Patient's Chart, lab work & pertinent test results  Airway Mallampati: II  TM Distance: >3 FB Neck ROM: Full    Dental  (+) Teeth Intact   Pulmonary neg pulmonary ROS,    Pulmonary exam normal        Cardiovascular hypertension, Pt. on medications + CAD, + Past MI, + CABG (2012) and + DVT  + Valvular Problems/Murmurs MR  Rhythm:Regular Rate:Normal     Neuro/Psych Anxiety negative neurological ROS     GI/Hepatic negative GI ROS, Neg liver ROS,   Endo/Other  diabetes, Type 1, Insulin Dependent  Renal/GU negative Renal ROS  negative genitourinary   Musculoskeletal  (+) Arthritis , Osteoarthritis,    Abdominal (+)  Abdomen: soft. Bowel sounds: normal.  Peds  Hematology negative hematology ROS (+)   Anesthesia Other Findings   Reproductive/Obstetrics                            Anesthesia Physical Anesthesia Plan  ASA: 3  Anesthesia Plan: MAC   Post-op Pain Management:    Induction: Intravenous  PONV Risk Score and Plan: 2 and Midazolam, Propofol infusion, Ondansetron and Treatment may vary due to age or medical condition  Airway Management Planned: Simple Face Mask, Natural Airway and Nasal Cannula  Additional Equipment: None  Intra-op Plan:   Post-operative Plan:   Informed Consent: I have reviewed the patients History and Physical, chart, labs and discussed the procedure including the risks, benefits and alternatives for the proposed anesthesia with the patient or authorized representative who has indicated his/her understanding and acceptance.     Dental advisory given  Plan Discussed with: CRNA  Anesthesia Plan Comments: (See PAT note written 04/16/2021 by Myra Gianotti, PA-C. Lab Results      Component                Value                Date                      WBC                      7.5                 04/13/2021                HGB                      10.7 (L)            04/13/2021                HCT                      32.0 (L)            04/13/2021                MCV                      90.9                04/13/2021                PLT  331                 04/13/2021           Lab Results      Component                Value               Date                      NA                       137                 04/13/2021                K                        4.1                 04/13/2021                CO2                      26                  04/13/2021                GLUCOSE                  155 (H)             04/13/2021                BUN                      31 (H)              04/13/2021                CREATININE               1.03 (H)            04/13/2021                CALCIUM                  9.5                 04/13/2021                GFRNONAA                 58 (L)              04/13/2021           Cardiac cath 09/24/20 ([facility]): Coronary Findings Diagnostic Dominance: Right  Left Anterior Descending: Mid LAD lesion is 100% stenosed. TIMI flow is 0. The lesion is chronically occluded. First Diagonal Branch: 1st Diag lesion is 40% stenosed. Not the culprit lesion. TIMI flow is 3.  Right Coronary Artery: Prox RCA lesion is 100% stenosed. TIMI flow is 0. The lesion is chronically occluded. Right Posterior Descending Artery: RPDA lesion is 60% stenosed. Not the culprit lesion. TIMI flow is 3. The lesion was not previously treated.  LIMA Graft To Dist LAD: The graft is moderate in size. The graft is angiographicallynormal.  Sequential Vein Graft To 2nd Mrg, 3rd Mrg: The graft was visualized by angiography and is moderate in size. The graft is angiographically normal. The flow is not reversed. Side to side om2, end to side om1 sequential graft  Vein Graft To RPDA: The graft was  visualized by angiography and is moderate in size. The graft is angiographically normal.   IMPRESSION:  Native chronic 3 vessel CAD  Patent sequential SVG to circ branches; Patent SVG to PDA; Patent LIMA  to LAD  Mild LV dysfunction  Note LAD occlusion probed with guidewire until further in lab eview of  images indicated no jeopardized branches between antegrade filling and  LIMA filling.   Plan: medical therapy including DAPT   Limited (unofficial) bedside echo on 12/07/20 per Dr. Minna Merritts, "In clinic we did a limb limited 2D echocardiogram showed EF has improved to normal."   Echo 09/22/20 ([facility]): SUMMARY  Left ventricular systolic function is mild to moderately reduced.  LV ejection fraction = 40%.  There is hypokinesis in the LAD distribution.  The right ventricular systolic function is normal.  The left atrium is moderately dilated.  The right atrium is mildly dilated. Moderate mitral annular calcification. There is no mitral regurgitation noted. No significant mitral valve stenosis.  There is mild to moderate tricuspid regurgitation.  IVC size was moderately dilated.  Compared to the last study dated 11/23/2019, LV function has declined and there are regional wall motion abnormalities consistent with ischemia.  )       Anesthesia Quick Evaluation

## 2021-04-16 NOTE — Pre-Procedure Instructions (Signed)
    Sanii Kukla  04/16/2021    Your procedure is scheduled on Wednesday, July 27..  Report to [facility] Admitting at 11:55  A.M.              Your surgery is scheduled to begin at 2:25 PM  Call this number if you have problems the morning of surgery:  [phone] - this is the pre- operative desk.  >>>>>Please send patient's Medication Record with medications administrated documentation. ( this information is required prior to OR. This includes medications that may have been on hold for surgery)<<<<<    Remember:  Do not eat after midnight.  You may drink clear liquids until 11:20 AM .  Clear liquids allowed are:                    Water, Juice (non-citric and without pulp - diabetics please choose diet or no sugar options), Carbonated beverages - (diabetics please choose diet or no sugar options), Clear Tea, Black Coffee only (no creamer, milk or cream including half and half), Plain Jell-O only (diabetics please choose diet or no sugar options), Gatorade (diabetics please choose diet or no sugar options), and Plain Popsicles only    Take these medicines the morning of surgery with A SIP OF WATER : Eye Drops.  STOP taking Aspirin Products (Goody Powder, Excedrin Migraine), Ibuprofen (Advil), Naproxen (Aleve), Vitamins and Herbal Products (ie Fish Oil).   Shower with antibiotic soap . Pat yourself dry with a CLEAN TOWEL. Do not wear lotions, powders, or perfumes, or deodorant. Please wear clean clothes to the hospital/surgery center.   Remember to brush your teeth WITH YOUR REGULAR TOOTHPASTE.   Do not wear jewelry, make-up or nail polish.  Do not shave 48 hours prior to surgery.   Do not bring valuables to the hospital.  [facility] is not responsible for any belongings or valuables.  Contacts, dentures or bridgework may not be worn into surgery.  Leave your suitcase in the car.  After surgery it may be brought to your

## 2021-04-16 NOTE — Progress Notes (Signed)
Anesthesia Chart Review: SAME DAY WORK-UP  Case: [id number] Date/Time: 04/17/21 1411   Procedure: RIGHT GREAT TOE AMPUTATION (Right)   Anesthesia type: Choice   Pre-op diagnosis: Osteomyelitis Right Great Toe   Location: [facility] OR ROOM 03 / [facility] OR   Surgeons: [name], MD       DISCUSSION: Patient is a 70 year old female scheduled for the above procedure.  She resides at [facility].  She was seen in the ED on 04/13/2021 for right great toe wound infection.  Wound described as necrotic with osteomyelitis of the distal tuft of the right great toe with mild cellulitis and erythema.  She received IV antibiotics in the ED and discharged on oral antibiotics with close follow-up with orthopedics.  She was evaluated by Dr. Lajoyce Corners on 04/15/2021.  Right great toe amputation recommended.    History includes never smoker, DM 1 (with retinopathy, neuropathy), CAD (CABG x4 2012; NSTEMI/demand ischemia 09/2020 in setting of DKA; continue medical therapy following 09/24/20 LHC), NSVT (54 beats during DKA admission 09/2020, only 9 beats on f/u Ziopatch), murmur/mitral regurgitation, DVT (RLE), glaucoma, RA.  Was admitted to [facility] 09/21/20-09/29/20 with DKA (pump out of insulin), Enterococcus faecium bacteremia (ID consulted as unclear source-acute cholecystis ruled out, urine culture negative, no vegetation noted on echo, possible left basilar PNA on CXR), NSTEMI/demand ischemia, new LV dysfunction (EF 40%), NSVT (54 beats), s/p LHC showing patent grafts with continued medical therapy recommended. EKG showed LBBB. Cardiology, endocrinology, PCP follow-up after discharge.  Last cardiology visit with Dr. Karren Burly on 01/17/21. Per notes, EP did an "unofficial TTE" (in March 2022) that showed "improving EF". She remained "completely asymptomatic from a cardiac standpoint.Marland KitchenMarland KitchenShe continues to walk 2-1/2 to 3 miles in 30 to 45 minutes regularly without any symptoms."  Next visit is  scheduled for 07/18/2021.  She uses a Freestyle Libre glucose monitor. A1c 8.6% on 02/05/21 ([facility]).  She is a same-day work-up.  Anesthesia team to evaluate on the day of surgery.  She is on Plavix for CAD, but has not had a recent PCI.  3 month Eliquis following 09/2020 provoked RLE DVT.   VS:  BP Readings from Last 3 Encounters:  04/13/21 (!) 159/62  03/26/21 (!) 84/57  03/18/21 117/62   Pulse Readings from Last 3 Encounters:  04/13/21 76  03/26/21 76  03/18/21 69     PROVIDERS: Carey Bullocks, MD is PCP [facility] [employer]) Bertram Gala, MD is cardiologist ([facility]) Sandy Salaam, MD is EP ([facility]). Last visit 12/07/20.  Doristine Bosworth, MD is endocrinologist ([facility])   LABS: She had a CBC and CMET on 04/13/21.  A1c 8.6% 02/05/21 ([facility]) Lab Results  Component Value Date   WBC 7.5 04/13/2021   HGB 10.7 (L) 04/13/2021   HCT 32.0 (L) 04/13/2021   PLT 331 04/13/2021   GLUCOSE 155 (H) 04/13/2021   CHOL 179 03/19/2021   TRIG 52 03/19/2021   HDL 83 (A) 03/19/2021   LDLCALC 86 03/19/2021   ALT 9 04/13/2021   AST 19 04/13/2021   NA 137 04/13/2021   K 4.1 04/13/2021   CL 100 04/13/2021   CREATININE 1.03 (H) 04/13/2021   BUN 31 (H) 04/13/2021   CO2 26 04/13/2021   HGBA1C 9.4 03/19/2021     IMAGES: CXR 10/30/20 ([facility]): FINDINGS:  Cardiac shadow is within normal limits. Postsurgical changes are  noted. Previously seen basilar effusions and airspace disease have  resolved in the interval. No acute bony abnormality is noted.  IMPRESSION:  No acute abnormality noted. Resolution of previously seen bibasilar  airspace disease.    EKG: EKG 10/30/20: Per Result Narrative [facility] Ventricular Rate                   73        BPM                  Atrial Rate                        73        BPM                  P-R Interval                       172       ms                    QRS Duration                       116       ms                     Q-T Interval                       446       ms                    QTC                                491       ms                    P Axis                             70        degrees              R Axis                             27        degrees              T Axis                             56        degrees               Sinus rhythm  Possible Left atrial enlargement  Left bundle branch block  Confirmed by Ginger Carne [phone]) on 10/30/2020 3:32:03 PM  - LBBB lateral leads noted on 09/24/20 EKG. Had LHC that same day, medical therapy recommended.    CV: Ziopatch Heart Monitor 10/22/20 ([facility]): As outlined in 12/07/20 office note by Dr. Rudolpho Sevin, "We reviewed her Zio patch shows that she has some minimum heart rate of 59 bpm average heart rate of 75 bpm and maximum heart rate 190 bpm. Also she has bundle branch block/IVCD. She had 1 run of  ventricular tachycardia that consistent with 9 beats. And also. She has had some SVT that longest 1 was 4 beats.   RLE Venous US 10/05/20 ([facility]): Per 10/05/20 telephone encounter: "I informed Ms. Greenlaw of R DVT on duplex. Recommend starting Eliquis 10mg  BID x 7 days and then 5mg  BID for at least 3 months for likely provoked DVT (decreased mobility in the setting of recent weeklong hospital stay for DKA, NSTEMI). She confirms no stents were placed during recent cath so I recommend stopping Plavix while on Eliquis and continuing ASA 81mg  to avoid bleeding risk on DAPT + A/C. I will include her cardiologist, Dr. Karren Burly, in this message in case she has different recommendations."   Cardiac cath 09/24/20 ([facility]): Coronary Findings Diagnostic Dominance: Right  Left Anterior Descending: Mid LAD lesion is 100% stenosed. TIMI flow is 0. The lesion is chronically occluded. First Diagonal Branch: 1st Diag lesion is 40% stenosed. Not the culprit lesion. TIMI flow is 3.  Right Coronary Artery: Prox RCA lesion is 100% stenosed. TIMI  flow is 0. The lesion is chronically occluded. Right Posterior Descending Artery: RPDA lesion is 60% stenosed. Not the culprit lesion. TIMI flow is 3. The lesion was not previously treated.  LIMA Graft To Dist LAD: The graft is moderate in size. The graft is angiographically normal.  Sequential Vein Graft To 2nd Mrg, 3rd Mrg: The graft was visualized by angiography and is moderate in size. The graft is angiographically normal. The flow is not reversed. Side to side om2, end to side om1 sequential graft  Vein Graft To RPDA: The graft was visualized by angiography and is moderate in size. The graft is angiographically normal.    IMPRESSION:  Native chronic 3 vessel CAD  Patent sequential SVG to circ branches;  Patent SVG to PDA;  Patent LIMA  to LAD  Mild LV dysfunction  Note LAD occlusion probed with guidewire until further in lab eview of  images indicated no jeopardized branches between antegrade filling and  LIMA filling.   Plan: medical therapy including DAPT    Limited (unofficial) bedside echo on 12/07/20 per Dr. Rudolpho Sevin, "In clinic we did a limb limited 2D echocardiogram showed EF has improved to normal."   Echo 09/22/20 ([facility]): SUMMARY  Left ventricular systolic function is mild to moderately reduced.  LV ejection fraction = 40%.  There is hypokinesis in the LAD distribution.  The right ventricular systolic function is normal.  The left atrium is moderately dilated.  The right atrium is mildly dilated.  Moderate mitral annular calcification. There is no mitral regurgitation noted. No significant mitral valve stenosis.  There is mild to moderate tricuspid regurgitation.  IVC size was moderately dilated.  Compared to the last study dated 11/23/2019, LV function has declined and there are regional wall motion abnormalities consistent with ischemia.    Carotid US 11/23/19 ([facility]): As outlined by Arnoldo Lenis, MD on 11/24/19: "Right: 21-39% diameter reduction of the right  bulb internal carotid artery. No evidence of hemodynamically significant internal carotid artery stenosis. The right vertebral artery flow is antegrade. Impression: Left: 21-39% diameter reduction of the left bulb and internal carotid artery. No evidence of hemodynamically significant internal carotid artery stenosis. The left vertebral artery flow is antegrade."   As outlined in 01/26/20 initial consult note by Dr. Karren Burly, "Dec 2012 CABGx 4 : LIMA to mid LAD than distal LAD, SVG to right PDA and to the left PLV, Intra-Op TEE showed EF 35 to 40% preop  and 50 to 55% postop with moderate concentric LVH and a mildly dilated left atrium"   Past Medical History:  Diagnosis Date   Abnormal thyroid blood test    Per Records recevied [facility], Dr.Pearson   Acute deep vein thrombosis of right peroneal vein (HCC) 08/2020   Per [facility] rece[facility]ied [facility], Dr.Pearson   Anxiety    Arthritis    RA   Coronary artery disease    Per [facility] New Patient Packet   Diabetes type I [facility])    Per [facility] New Patient Packet   Diabetic retinopathy (HCC)    Per [facility] New Patient Packet   DKA (diabetic ketoacidosis) (HCC) 2021   Dr.William Zackowski.  Insulin Pump was out of Insulin   Glaucoma    Per [facility] New Patient Packet   H/O heart bypass surgery 2012   Dr. 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 Aquilla Hacker, Per [facility] New Patient Packet   CESAREAN SECTION  1973   Dr. Aquilla Hacker, Per [facility] New Patient Packet   CORONARY ARTERY BYPASS GRAFT      MEDICATIONS: No current facility-administered medications for this encounter.    aspirin EC 81 MG tablet   atorvastatin (LIPITOR) 40 MG tablet   Biotin w/ Vitamins C & E (HAIR/SKIN/NAILS PO)   brimonidine (ALPHAGAN) 0.2 % ophthalmic solution   Calcium Carbonate (CALCIUM-CARB 600 PO)   clopidogrel (PLAVIX) 75 MG tablet   doxycycline (VIBRAMYCIN) 100 MG capsule   furosemide (LASIX) 20 MG tablet   glucosamine-chondroitin 500-400 MG tablet   insulin glargine (LANTUS SOLOSTAR) 100 UNIT/ML Solostar Pen   insulin lispro (HUMALOG) 100 UNIT/ML KwikPen   latanoprost (XALATAN) 0.005 % ophthalmic solution   lisinopril (ZESTRIL) 2.5 MG tablet   Multiple Vitamins-Minerals (I-VITE PO)   nitroGLYCERIN (NITROSTAT) 0.4 MG SL tablet   Omega-3 Fatty Acids (FISH OIL) 1000 MG CAPS   sennosides-docusate sodium (SENOKOT-S) 8.6-50 MG tablet   timolol (TIMOPTIC) 0.5 % ophthalmic solution   Accu-Chek Softclix Lancets lancets   glucose blood test strip   Insulin Pen Needle (PEN NEEDLES 3/16") 31G X 5 MM MISC    Shonna Chock, PA-C Surgical Short Stay/Anesthesiology [facility] Phone [phone] [facility] Phone [phone] 04/16/2021 4:33 PM

## 2021-04-16 NOTE — Progress Notes (Addendum)
I spoke to Darl Pikes, Charity fundraiser at [employer] living.  [place] reports that Mrs. Odell manages blood sugar. Patient has a Lobbyist,, she calls the nurse and tells her how much Insulin she is taking. The nurses at [facility] administer all pills and eye gtts.   Mrs Pitner has type I diabetes. Patient takes 12 units of Lantus at hs, if CBG is greater than 220, patient will adjust the amount to keep her CBG from dropping less than 90.  I instructed patient to take 9 units of Lantus tonight. Mrs Windsor takes Humulog SS with meals, patient has an amount that she takes based on the amount of carbohydrates she consumes and a correction  dose. Ms Tschantz states that evening CBG have been in the 250's and AM CBG run around 159. Mrs. Kelli Churn will check CBG in am numerous times and will treat if CBG is less than 90, is it is she will drink 4 ounces of cranberry or apple juice and recheck it in 15 minutes.  I received Sliding Scale Insulin orders from [facility], I faxed a copy to Pharmacy, Home Medication Call Center.

## 2021-04-16 NOTE — Progress Notes (Signed)
Office Visit Note   Patient: Caitlin Sharp           Date of Birth: [birth year]           MRN: [id number] Visit Date: 04/15/2021              Requested by: No referring provider defined for this encounter. PCP: Pcp, No  Chief Complaint  Patient presents with   Right Foot - Follow-up    ER 04/13/21      HPI: Patient is a 70 year old woman who was seen for initial evaluation referral from the emergency room for ulceration right great toe.  Patient has been on doxycycline since July 23.  Patient is on Plavix she has diabetes with a hemoglobin A1c of 9.4 coronary artery disease and a history of DVT in the right lower extremity.  Assessment & Plan: Visit Diagnoses:  1. Osteomyelitis of great toe of right foot (HCC)     Plan: We will plan for amputation of the right great toe on Friday.  Risk and benefits were discussed including risk of the wound not healing.  Patient states she understands wished to proceed at this time plan for outpatient surgery.  Follow-Up Instructions: Return in about 2 weeks (around 04/29/2021).   Ortho Exam  Patient is alert, oriented, no adenopathy, well-dressed, normal affect, normal respiratory effort. Examination patient has a strong dorsalis pedis and posterior tibial pulse.  The cellulitis of the great toe is resolving.  There is a necrotic ulcer.  Review of the radiographs shows osteomyelitis of the tuft of the great toe.  Imaging: No results found. No images are attached to the encounter.  Labs: Lab Results  Component Value Date   HGBA1C 9.4 03/19/2021   REPTSTATUS PENDING 04/13/2021   REPTSTATUS PENDING 04/13/2021   CULT  04/13/2021    NO GROWTH 3 DAYS Performed at [facility], [address] [place], [place] [address]    CULT  04/13/2021    NO GROWTH 3 DAYS Performed at [facility], [address] [place], [place] [address]      Lab Results  Component Value Date   ALBUMIN 3.8 04/13/2021   ALBUMIN 4.4 03/19/2021     No results found for: MG No results found for: VD25OH  No results found for: PREALBUMIN CBC EXTENDED Latest Ref Rng & Units 04/13/2021 03/19/2021  WBC 4.0 - 10.5 K/uL 7.5 5.4  RBC 3.87 - 5.11 MIL/uL 3.52(L) 3.58(A)  HGB 12.0 - 15.0 g/dL 10.7(L) 11.4(A)  HCT 36.0 - 46.0 % 32.0(L) 32(A)  PLT 150 - 400 K/uL 331 280  NEUTROABS 1.7 - 7.7 K/uL 4.4 -  LYMPHSABS 0.7 - 4.0 K/uL 2.1 -     There is no height or weight on file to calculate BMI.  Orders:  No orders of the defined types were placed in this encounter.  No orders of the defined types were placed in this encounter.    Procedures: No procedures performed  Clinical Data: No additional findings.  ROS:  All other systems negative, except as noted in the HPI. Review of Systems  Objective: Vital Signs: There were no vitals taken for this visit.  Specialty Comments:  No specialty comments available.  PMFS History: There are no problems to display for this patient.  Past Medical History:  Diagnosis Date   Abnormal thyroid blood test    Per Records recevied from [facility], Dr.Pearson   Acute deep vein thrombosis of right peroneal vein (HCC)  Per Records recevied from [facility], Dr.Pearson   Coronary artery disease    Per [facility] New Patient Packet   Diabetes type I [facility])    Per [facility] New Patient Packet   Diabetic retinopathy (HCC)    Per [facility] New Patient Packet   DKA (diabetic ketoacidosis) (HCC) 2021   Dr.William Zackowski   Glaucoma    Per [facility] New Patient Packet   H/O heart bypass surgery 2012   Dr. Excell Seltzer, Per [facility] New Patient Packet   Hx of CABG    Per records received form [facility], Dr.Pearson    Mild mitral regurgitation    Per records from [facility], Dr.Pearson    Neuropathy 1976   Per [facility] New Patient Packet   NSTEMI (non-ST elevated myocardial infarction) (HCC)    Dr.Cheek, Per [facility] New Patient Packet   Osteoporosis    T Score -2.7 , Per [facility] New Patient Packet   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 due to underlying condition [facility])    Per Records recevied from [facility], Dr.Pearson    Family History  Problem Relation Age of Onset   Congestive Heart Failure Mother        Per [facility] New Patient Packet   Arthritis Mother        Per [facility] New Patient Packet   Osteoporosis Mother        Per [facility] New Patient Packet   Heart attack Father        Per [facility] New Patient Packet   Diabetes Sister        Per [facility] New Patient Packet   Emphysema Brother        Per [facility] New Patient Packet   Heart disease Brother        Per [facility] New Patient Packet    Past Surgical History:  Procedure Laterality Date   CESAREAN SECTION  1970   Dr. Aquilla Hacker, Per [facility] New Patient Packet   CESAREAN SECTION  1973   Dr. Aquilla Hacker, Per [facility] New Patient Packet   Social History   Occupational History   Not on file  Tobacco Use   Smoking status: Never   Smokeless tobacco: Never  Vaping Use   Vaping Use: Never used  Substance and Sexual Activity   Alcohol use: Never   Drug use: Never   Sexual activity: Not on file

## 2021-04-17 ENCOUNTER — Encounter (HOSPITAL_COMMUNITY)
Admission: RE | Disposition: A | Discharge status: Skilled Nursing Facility | Payer: Self-pay | Source: Home / Self Care | Attending: Orthopedic Surgery

## 2021-04-17 ENCOUNTER — Ambulatory Visit (HOSPITAL_COMMUNITY): Payer: Medicare Other | Admitting: Vascular Surgery

## 2021-04-17 ENCOUNTER — Other Ambulatory Visit: Payer: Self-pay

## 2021-04-17 ENCOUNTER — Ambulatory Visit (HOSPITAL_COMMUNITY)
Admission: RE | Admit: 2021-04-17 | Discharge: 2021-04-17 | Disposition: A | Discharge status: Skilled Nursing Facility | Payer: Medicare Other | Attending: Orthopedic Surgery | Admitting: Orthopedic Surgery

## 2021-04-17 ENCOUNTER — Encounter (HOSPITAL_COMMUNITY): Payer: Self-pay | Admitting: Orthopedic Surgery

## 2021-04-17 DIAGNOSIS — Z79899 Other long term (current) drug therapy: Secondary | ICD-10-CM | POA: Insufficient documentation

## 2021-04-17 DIAGNOSIS — Z86718 Personal history of other venous thrombosis and embolism: Secondary | ICD-10-CM | POA: Insufficient documentation

## 2021-04-17 DIAGNOSIS — E1069 Type 1 diabetes mellitus with other specified complication: Secondary | ICD-10-CM | POA: Insufficient documentation

## 2021-04-17 DIAGNOSIS — H409 Unspecified glaucoma: Secondary | ICD-10-CM | POA: Diagnosis not present

## 2021-04-17 DIAGNOSIS — E10621 Type 1 diabetes mellitus with foot ulcer: Secondary | ICD-10-CM | POA: Diagnosis not present

## 2021-04-17 DIAGNOSIS — E1040 Type 1 diabetes mellitus with diabetic neuropathy, unspecified: Secondary | ICD-10-CM | POA: Diagnosis not present

## 2021-04-17 DIAGNOSIS — I2510 Atherosclerotic heart disease of native coronary artery without angina pectoris: Secondary | ICD-10-CM | POA: Diagnosis not present

## 2021-04-17 DIAGNOSIS — L97519 Non-pressure chronic ulcer of other part of right foot with unspecified severity: Secondary | ICD-10-CM | POA: Diagnosis not present

## 2021-04-17 DIAGNOSIS — Z7902 Long term (current) use of antithrombotics/antiplatelets: Secondary | ICD-10-CM | POA: Insufficient documentation

## 2021-04-17 DIAGNOSIS — E103593 Type 1 diabetes mellitus with proliferative diabetic retinopathy without macular edema, bilateral: Secondary | ICD-10-CM | POA: Insufficient documentation

## 2021-04-17 DIAGNOSIS — Z794 Long term (current) use of insulin: Secondary | ICD-10-CM | POA: Insufficient documentation

## 2021-04-17 DIAGNOSIS — Z7982 Long term (current) use of aspirin: Secondary | ICD-10-CM | POA: Insufficient documentation

## 2021-04-17 DIAGNOSIS — M869 Osteomyelitis, unspecified: Secondary | ICD-10-CM | POA: Diagnosis not present

## 2021-04-17 DIAGNOSIS — Z880 Allergy status to penicillin: Secondary | ICD-10-CM | POA: Diagnosis not present

## 2021-04-17 DIAGNOSIS — I252 Old myocardial infarction: Secondary | ICD-10-CM | POA: Diagnosis not present

## 2021-04-17 DIAGNOSIS — Z951 Presence of aortocoronary bypass graft: Secondary | ICD-10-CM | POA: Diagnosis not present

## 2021-04-17 HISTORY — PX: AMPUTATION: SHX166

## 2021-04-17 HISTORY — DX: Unspecified osteoarthritis, unspecified site: M19.90

## 2021-04-17 HISTORY — DX: Left bundle-branch block, unspecified: I44.7

## 2021-04-17 HISTORY — DX: Personal history of urinary calculi: Z87.442

## 2021-04-17 HISTORY — DX: Cardiac murmur, unspecified: R01.1

## 2021-04-17 HISTORY — DX: Anxiety disorder, unspecified: F41.9

## 2021-04-17 LAB — PROTIME-INR
INR: 1.1 (ref 0.8–1.2)
Prothrombin Time: 13.8 seconds (ref 11.4–15.2)

## 2021-04-17 LAB — GLUCOSE, CAPILLARY
Glucose-Capillary: 132 mg/dL — ABNORMAL HIGH (ref 70–99)
Glucose-Capillary: 122 mg/dL — ABNORMAL HIGH (ref 70–99)
Glucose-Capillary: 149 mg/dL — ABNORMAL HIGH (ref 70–99)

## 2021-04-17 LAB — APTT: aPTT: 26 seconds (ref 24–36)

## 2021-04-17 SURGERY — AMPUTATION DIGIT
Anesthesia: Monitor Anesthesia Care | Laterality: Right

## 2021-04-17 MED ORDER — FENTANYL CITRATE (PF) 250 MCG/5ML IJ SOLN
INTRAMUSCULAR | Status: DC | PRN
  Administered 2021-04-17 (×2): 50 ug via INTRAVENOUS

## 2021-04-17 MED ORDER — CHLORHEXIDINE GLUCONATE 0.12 % MT SOLN
OROMUCOSAL | Status: AC
  Administered 2021-04-17: 15 mL via OROMUCOSAL
  Filled 2021-04-17: qty 15

## 2021-04-17 MED ORDER — PROMETHAZINE HCL 25 MG/ML IJ SOLN: 6.2500 mg | INTRAMUSCULAR | Status: DC | PRN

## 2021-04-17 MED ORDER — FENTANYL CITRATE (PF) 250 MCG/5ML IJ SOLN
INTRAMUSCULAR | Status: AC
  Filled 2021-04-17: qty 5

## 2021-04-17 MED ORDER — LACTATED RINGERS IV SOLN: INTRAVENOUS | Status: DC

## 2021-04-17 MED ORDER — ACETAMINOPHEN 10 MG/ML IV SOLN: 1000.0000 mg | Freq: Once | INTRAVENOUS | Status: DC | PRN

## 2021-04-17 MED ORDER — PROPOFOL 10 MG/ML IV BOLUS
INTRAVENOUS | Status: AC
  Filled 2021-04-17: qty 20

## 2021-04-17 MED ORDER — MIDAZOLAM HCL 2 MG/2ML IJ SOLN
INTRAMUSCULAR | Status: DC | PRN
  Administered 2021-04-17: 2 mg via INTRAVENOUS

## 2021-04-17 MED ORDER — MIDAZOLAM HCL 2 MG/2ML IJ SOLN
INTRAMUSCULAR | Status: AC
  Filled 2021-04-17: qty 2

## 2021-04-17 MED ORDER — LIDOCAINE HCL (PF) 1 % IJ SOLN
INTRAMUSCULAR | Status: DC | PRN
  Administered 2021-04-17: 10 mL

## 2021-04-17 MED ORDER — 0.9 % SODIUM CHLORIDE (POUR BTL) OPTIME
TOPICAL | Status: DC | PRN
  Administered 2021-04-17: 1000 mL

## 2021-04-17 MED ORDER — CHLORHEXIDINE GLUCONATE 0.12 % MT SOLN: 15.0000 mL | Freq: Once | OROMUCOSAL | Status: AC

## 2021-04-17 MED ORDER — CLINDAMYCIN PHOSPHATE 900 MG/50ML IV SOLN
900.0000 mg | INTRAVENOUS | Status: AC
  Administered 2021-04-17: 900 mg via INTRAVENOUS

## 2021-04-17 MED ORDER — ORAL CARE MOUTH RINSE: 15.0000 mL | Freq: Once | OROMUCOSAL | Status: AC

## 2021-04-17 MED ORDER — LIDOCAINE HCL (PF) 1 % IJ SOLN
INTRAMUSCULAR | Status: AC
  Filled 2021-04-17: qty 30

## 2021-04-17 MED ORDER — PROPOFOL 10 MG/ML IV BOLUS
INTRAVENOUS | Status: DC | PRN
  Administered 2021-04-17: 20 mg via INTRAVENOUS

## 2021-04-17 MED ORDER — FENTANYL CITRATE (PF) 100 MCG/2ML IJ SOLN: 25.0000 ug | INTRAMUSCULAR | Status: DC | PRN

## 2021-04-17 MED ORDER — LIDOCAINE 2% (20 MG/ML) 5 ML SYRINGE
INTRAMUSCULAR | Status: DC | PRN
  Administered 2021-04-17: 1000 mg via INTRAVENOUS

## 2021-04-17 MED ORDER — CLINDAMYCIN PHOSPHATE 900 MG/50ML IV SOLN
INTRAVENOUS | Status: AC
  Filled 2021-04-17: qty 50

## 2021-04-17 SURGICAL SUPPLY — 30 items
BAG COUNTER SPONGE SURGICOUNT (BAG) ×2 IMPLANT
BAG SPNG CNTER NS LX DISP (BAG) ×1
BLADE SURG 21 STRL SS (BLADE) ×2 IMPLANT
BNDG CMPR 9X4 STRL LF SNTH (GAUZE/BANDAGES/DRESSINGS)
BNDG COHESIVE 4X5 TAN STRL (GAUZE/BANDAGES/DRESSINGS) ×2 IMPLANT
BNDG ESMARK 4X9 LF (GAUZE/BANDAGES/DRESSINGS) IMPLANT
BNDG GAUZE ELAST 4 BULKY (GAUZE/BANDAGES/DRESSINGS) ×2 IMPLANT
COVER SURGICAL LIGHT HANDLE (MISCELLANEOUS) ×4 IMPLANT
DRAPE U-SHAPE 47X51 STRL (DRAPES) ×2 IMPLANT
DRSG ADAPTIC 3X8 NADH LF (GAUZE/BANDAGES/DRESSINGS) ×2 IMPLANT
DRSG PAD ABDOMINAL 8X10 ST (GAUZE/BANDAGES/DRESSINGS) ×2 IMPLANT
DURAPREP 26ML APPLICATOR (WOUND CARE) ×2 IMPLANT
ELECT REM PT RETURN 9FT ADLT (ELECTROSURGICAL) ×2
ELECTRODE REM PT RTRN 9FT ADLT (ELECTROSURGICAL) ×1 IMPLANT
GAUZE SPONGE 4X4 12PLY STRL (GAUZE/BANDAGES/DRESSINGS) ×2 IMPLANT
GLOVE SURG ORTHO LTX SZ9 (GLOVE) ×2 IMPLANT
GLOVE SURG UNDER POLY LF SZ9 (GLOVE) ×2 IMPLANT
GOWN STRL REUS W/ TWL XL LVL3 (GOWN DISPOSABLE) ×2 IMPLANT
GOWN STRL REUS W/TWL XL LVL3 (GOWN DISPOSABLE) ×4
KIT BASIN OR (CUSTOM PROCEDURE TRAY) ×2 IMPLANT
KIT TURNOVER KIT B (KITS) ×2 IMPLANT
MANIFOLD NEPTUNE II (INSTRUMENTS) ×2 IMPLANT
NEEDLE 22X1 1/2 (OR ONLY) (NEEDLE) ×2 IMPLANT
NS IRRIG 1000ML POUR BTL (IV SOLUTION) ×2 IMPLANT
PACK ORTHO EXTREMITY (CUSTOM PROCEDURE TRAY) ×2 IMPLANT
PAD ABD 8X10 STRL (GAUZE/BANDAGES/DRESSINGS) ×2 IMPLANT
PAD ARMBOARD 7.5X6 YLW CONV (MISCELLANEOUS) ×4 IMPLANT
SUT ETHILON 2 0 PSLX (SUTURE) ×2 IMPLANT
SYR CONTROL 10ML LL (SYRINGE) IMPLANT
TOWEL GREEN STERILE (TOWEL DISPOSABLE) ×2 IMPLANT

## 2021-04-17 NOTE — Transfer of Care (Signed)
Immediate Anesthesia Transfer of Care Note  Patient: Carron Mcmurry  Procedure(s) Performed: RIGHT GREAT TOE AMPUTATION (Right)  Patient Location: PACU  Anesthesia Type:MAC  Level of Consciousness: drowsy and patient cooperative  Airway & Oxygen Therapy: Patient Spontanous Breathing  Post-op Assessment: Report given to RN and Post -op Vital signs reviewed and stable  Post vital signs: Reviewed and stable  Last Vitals:  Vitals Value Taken Time  BP 159/50 04/17/21 1517  Temp    Pulse 77 04/17/21 1520  Resp 16 04/17/21 1520  SpO2 100 % 04/17/21 1520  Vitals shown include unvalidated device data.  Last Pain:  Vitals:   04/17/21 1226  TempSrc:   PainSc: 0-No pain         Complications: No notable events documented.

## 2021-04-17 NOTE — Progress Notes (Signed)
Notified Dr. Nance Pew that pt drank approximately four ounces of orange juice this am around 0830. Per Dr. Twana First is ok. No new orders.

## 2021-04-17 NOTE — Interval H&P Note (Signed)
History and Physical Interval Note:  04/17/2021 2:12 PM  Caitlin Sharp  has presented today for surgery, with the diagnosis of Osteomyelitis Right Great Toe.  The various methods of treatment have been discussed with the patient and family. After consideration of risks, benefits and other options for treatment, the patient has consented to  Procedure(s): RIGHT GREAT TOE AMPUTATION (Right) as a surgical intervention.  The patient's history has been reviewed, patient examined, no change in status, stable for surgery.  I have reviewed the patient's chart and labs.  Questions were answered to the patient's satisfaction.     [name]

## 2021-04-17 NOTE — Op Note (Signed)
04/17/2021  3:23 PM  PATIENT:  Caitlin Sharp    PRE-OPERATIVE DIAGNOSIS:  Osteomyelitis Right Great Toe  POST-OPERATIVE DIAGNOSIS:  Same  PROCEDURE:  RIGHT GREAT TOE AMPUTATION  SURGEON:  Newt Minion, MD  PHYSICIAN ASSISTANT:None ANESTHESIA:   General  PREOPERATIVE INDICATIONS:  Caitlin Sharp is a  70 y.o. female with a diagnosis of Osteomyelitis Right Great Toe who failed conservative measures and elected for surgical management.    The risks benefits and alternatives were discussed with the patient preoperatively including but not limited to the risks of infection, bleeding, nerve injury, cardiopulmonary complications, the need for revision surgery, among others, and the patient was willing to proceed.  OPERATIVE IMPLANTS: None  _0 @  OPERATIVE FINDINGS: Good petechial bleeding at the amputation site  OPERATIVE PROCEDURE: Patient was brought the operating room underwent a MAC anesthetic.  The right lower extremity was then prepped using DuraPrep draped into a sterile field a timeout was called.  Patient underwent a digital block with 10 cc of 1% lidocaine plain.  After adequate levels anesthesia obtained a fishmouth incision was made just distal to the MTP joint and the right great toe was amputated through the MTP joint.  Electrocardio was used hemostasis wound was irrigated with normal saline the incision was closed using 2-0 nylon.  Sterile dressing was applied patient was taken the PACU in stable condition   DISCHARGE PLANNING:  Antibiotic duration: Preoperative antibiotics  Weightbearing: Weightbearing as tolerated  Pain medication: Patient was given a prescription for [facility] to provide Vicodin.  Dressing care/ Wound VAC: Change dressing as needed  Ambulatory devices: Walker  Discharge to: [facility] skilled nursing.  Follow-up: In the office 1 week post operative.

## 2021-04-17 NOTE — Progress Notes (Signed)
Orthopedic Tech Progress Note Patient Details:  Caitlin Sharp 09-21-1951 [id number]  PACU RN called requesting a POST OP SHOE for patient   Ortho Devices Type of Ortho Device: Postop shoe/boot Ortho Device/Splint Location: RLE Ortho Device/Splint Interventions: Other (comment)   Post Interventions Patient Tolerated: Well Instructions Provided: Care of device  [name] 04/17/2021, 4:44 PM

## 2021-04-17 NOTE — H&P (Signed)
Peg Fifer is an 70 y.o. female.   Chief Complaint: Ulceration and osteomyelitis right great toe. HPI: Patient is a 70 year old woman who was seen for initial evaluation referral from the emergency room for ulceration right great toe.  Patient has been on doxycycline since July 23.  Patient is on Plavix she has diabetes with a hemoglobin A1c of 9.4 coronary artery disease and a history of DVT in the right lower extremity.  Past Medical History:  Diagnosis Date   Abnormal thyroid blood test    Per Records recevied from [facility], Dr.Pearson   Acute deep vein thrombosis of right peroneal vein (HCC) 10/05/2020   RLE DVT   Anxiety    Arthritis    RA   Coronary artery disease    Diabetes type I [facility])    Per [facility] New Patient Packet   Diabetic retinopathy (HCC)    Per [facility] New Patient Packet   DKA (diabetic ketoacidosis) (HCC) 2021   Dr.William Zackowski.  Insulin Pump was out of Insulin   Glaucoma    Per [facility] New Patient Packet   H/O heart bypass surgery 2012   Dr. Excell Seltzer, Per [facility] New Patient Packet   Heart murmur    when I was a teenager   History of kidney stones    passed   Hx of CABG    Per records received form [facility], Dr.Pearson    Left bundle branch block    Mild mitral regurgitation    Per records from [facility], Dr.Pearson    Neuropathy 1976   Per [facility] New Patient Packet   NSTEMI (non-ST elevated myocardial infarction) (HCC) 09/21/2020   NSTEMI/demand ischemic in setting of DKA, patent grafts by 09/24/20 LHC, continue medical therapy   Osteoporosis    T Score -2.7 , Per [facility] New Patient Packet   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 Aquilla Hacker, Per [facility] New Patient Packet   CESAREAN SECTION  1973   Dr. Aquilla Hacker, Per [facility] New Patient Packet    CORONARY ARTERY BYPASS GRAFT      Family History  Problem Relation Age of Onset   Congestive Heart Failure Mother        Per [facility] New Patient Packet   Arthritis Mother        Per [facility] New Patient Packet   Osteoporosis Mother        Per [facility] New Patient Packet   Heart attack Father        Per [facility] New Patient Packet   Diabetes Sister        Per [facility] New Patient Packet   Emphysema Brother        Per [facility] New Patient Packet   Heart disease Brother        Per [facility] New Patient Packet   Social History:  reports that she has never smoked. She has never used smokeless tobacco. She reports that she does not drink alcohol and does not use drugs.  Allergies:  Allergies  Allergen Reactions   Penicillins Anaphylaxis    No medications prior to admission.    No results found for this or any previous visit (from the past 48 hour(s)). No results found.  Review of Systems  All other systems reviewed and are negative.  There were no vitals taken for this visit. Physical Exam  Patient is alert, oriented, no adenopathy, well-dressed, normal affect, normal respiratory effort. Examination patient has a strong dorsalis pedis and posterior tibial pulse.  The cellulitis of the great toe is resolving.  There is a necrotic ulcer.  Review of the radiographs shows osteomyelitis of the tuft of the great toe. Assessment/Plan 1. Osteomyelitis of great toe of right foot (HCC)       Plan: We will plan for amputation of the right great toe on Friday.  Risk and benefits were discussed including risk of the wound not healing.  Patient states she understands wished to proceed at this time plan for outpatient surgery.  [name], MD 04/17/2021, 6:58 AM

## 2021-04-17 NOTE — Progress Notes (Signed)
Dr. Lajoyce Corners made aware of last plavix dose 04/16/21. Ok to proceed with surgery.

## 2021-04-18 ENCOUNTER — Other Ambulatory Visit: Payer: Self-pay | Admitting: Adult Health

## 2021-04-18 ENCOUNTER — Non-Acute Institutional Stay (SKILLED_NURSING_FACILITY): Payer: Medicare Other | Admitting: Adult Health

## 2021-04-18 ENCOUNTER — Telehealth: Payer: Self-pay

## 2021-04-18 ENCOUNTER — Encounter (HOSPITAL_COMMUNITY): Payer: Self-pay | Admitting: Orthopedic Surgery

## 2021-04-18 DIAGNOSIS — I10 Essential (primary) hypertension: Secondary | ICD-10-CM

## 2021-04-18 DIAGNOSIS — S98111A Complete traumatic amputation of right great toe, initial encounter: Secondary | ICD-10-CM

## 2021-04-18 DIAGNOSIS — E1059 Type 1 diabetes mellitus with other circulatory complications: Secondary | ICD-10-CM | POA: Diagnosis not present

## 2021-04-18 DIAGNOSIS — E785 Hyperlipidemia, unspecified: Secondary | ICD-10-CM

## 2021-04-18 DIAGNOSIS — L98494 Non-pressure chronic ulcer of skin of other sites with necrosis of bone: Secondary | ICD-10-CM

## 2021-04-18 LAB — CULTURE, BLOOD (ROUTINE X 2)
Culture: NO GROWTH
Culture: NO GROWTH
Special Requests: ADEQUATE
Special Requests: ADEQUATE

## 2021-04-18 MED ORDER — HYDROCODONE-ACETAMINOPHEN 5-325 MG PO TABS: 1.0000 | ORAL_TABLET | ORAL | 0 refills | Status: DC | PRN

## 2021-04-18 NOTE — Progress Notes (Signed)
Location:   [facility] Room Number: 155 Place of Service:  SNF (31) Provider:  Fletcher Anon, NP  Patient Care Team: Pcp, No as PCP - General Caitlin Skeens, MD as Referring Physician (Ophthalmology) Beverely Pace, [name], MD as Referring Physician (Cardiology) Bertram Gala, MD as Referring Physician (Cardiology)  Extended Emergency Contact Information Primary Emergency Contact: [name] Address: [address]          [place], [place] [address] [name] of [place] Home Phone: [phone] Mobile Phone: [phone] Relation: Son Preferred language: English Interpreter needed? No  Code Status:  Full Code Goals of care: Advanced Directive information Advanced Directives 04/17/2021  Does Patient Have a Medical Advance Directive? No  Would patient like information on creating a medical advance directive? No - Patient declined     Chief Complaint  Patient presents with   Acute Visit    s/p toe amputation     HPI:  Pt is a 70 y.o. female seen today for an acute visit for s/p toe amputation. [facility] after an amputation of the right great toe. She was seen in the ED on 7/23 for a skin ulcer which had become necrotic. Xrays revealed osteomyelitis. She was given vanc, cefepime, and flagyl and the dischared on doxycycline. She was referred to see Dr. Lajoyce Corners and on 7/27 he amputated the right great toe. She is still on doxycycline with a wrap on her right foot.  She has a hx of Type 1 DM and manages her own insulin. A1C 9.4 LDL 86  She reports she has pain to the foot and uses norco which helps. She has difficulty describing her pain. Denies any foot neuropathy and says she could feel crumbs on the floor when walking prior to the foot ulcer. She had tried to treat it herself with rubbing alcohol. She has declining vision and hx of hypoglycemia and DKA.  She is working with PT and would like to return to [place] next  week.   We ordered ABIs but they could only do the left at this time due to the dressing she has post op. They will come back and do the right once the dressing is removed.   Left ABI 7/28  Left PT 0.93 Left DP 0.85  Past Medical History:  Diagnosis Date   Abnormal thyroid blood test    Per Records recevied from [facility], Dr.Pearson   Acute deep vein thrombosis of right peroneal vein (HCC) 10/05/2020   RLE DVT   Anxiety    Arthritis    RA   Coronary artery disease    Diabetes type I [facility])    Per [facility] New Patient Packet   Diabetic retinopathy (HCC)    Per [facility] New Patient Packet   DKA (diabetic ketoacidosis) (HCC) 2021   Dr.William Zackowski.  Insulin Pump was out of Insulin   Glaucoma    Per [facility] New Patient Packet   H/O heart bypass surgery 2012   Dr. Excell Seltzer, Per [facility] New Patient Packet   Heart murmur    when I was a teenager   History of kidney stones    passed   Hx of CABG    Per records received form [facility], Dr.Pearson    Left bundle branch block    Mild mitral regurgitation    Per records from [facility], Dr.Pearson    Neuropathy 1976   Per [facility] New Patient Packet   NSTEMI (non-ST elevated myocardial  infarction) (HCC) 09/21/2020   NSTEMI/demand ischemic in setting of DKA, patent grafts by 09/24/20 LHC, continue medical therapy   Osteoporosis    T Score -2.7 , Per [facility] New Patient Packet   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 Aquilla Hacker, Per [facility] New Patient Packet   CESAREAN SECTION  1973   Dr. Aquilla Hacker, Per [facility] New Patient Packet   CORONARY ARTERY BYPASS GRAFT      Allergies   Allergen Reactions   Penicillins Anaphylaxis    Allergies as of 04/18/2021       Reactions   Penicillins Anaphylaxis        Medication List        Accurate as of April 18, 2021  2:57 PM. If you have any questions, ask your nurse or doctor.          Accu-Chek Softclix Lancets lancets USE 1 TO CHECK GLUCOSE THREE TIMES DAILY   aspirin EC 81 MG tablet Take 81 mg by mouth in the morning. Swallow whole.   atorvastatin 40 MG tablet Commonly known as: LIPITOR Take 40 mg by mouth at bedtime.   brimonidine 0.2 % ophthalmic solution Commonly known as: ALPHAGAN Place 1 drop into both eyes 3 (three) times daily.   CALCIUM-CARB 600 PO Take 600 mg by mouth in the morning.   clopidogrel 75 MG tablet Commonly known as: PLAVIX Take 75 mg by mouth in the morning.   doxycycline 100 MG capsule Commonly known as: VIBRAMYCIN Take 1 capsule (100 mg total) by mouth 2 (two) times daily.   Fish Oil 1000 MG Caps Take 1,000 mg by mouth in the morning.   furosemide 20 MG tablet Commonly known as: LASIX Take 20 mg by mouth in the morning.   glucosamine-chondroitin 500-400 MG tablet Take 1 tablet by mouth every morning.   glucose blood test strip 1 each by Other route as needed for other. Accu-chek   HAIR/SKIN/NAILS PO Take 1 tablet by mouth in the morning.   HYDROcodone-acetaminophen 5-325 MG tablet Commonly known as: NORCO/VICODIN Take 1 tablet by mouth every 4 (four) hours as needed for moderate pain. Started by: Fletcher Anon, NP   I-VITE PO Take 1 tablet by mouth in the morning.   insulin lispro 100 UNIT/ML KwikPen Commonly known as: HUMALOG Inject 2-10 Units into the skin 3 (three) times daily.   Lantus SoloStar 100 UNIT/ML Solostar Pen Generic drug: insulin glargine Inject 12 Units into the skin at bedtime.   latanoprost 0.005 % ophthalmic solution Commonly known as: XALATAN Place 1 drop into both eyes at bedtime. (2100)   lisinopril 2.5 MG tablet Commonly  known as: ZESTRIL Take 2.5 mg by mouth in the morning.   nitroGLYCERIN 0.4 MG SL tablet Commonly known as: NITROSTAT Place 0.4 mg under the tongue every 5 (five) minutes x 3 doses as needed for chest pain.   Pen Needles 3/16" 31G X 5 MM Misc 1 Device by Does not apply route 5 (five) times daily. With insulin administration   sennosides-docusate sodium 8.6-50 MG tablet Commonly known as: SENOKOT-S Take 1 tablet by mouth every other day. IN  THE MORNING   timolol 0.5 % ophthalmic solution Commonly known as: TIMOPTIC Place 1 drop into the left eye in the morning and at bedtime.        Review of Systems  Constitutional:  Negative for activity change, appetite change, chills, diaphoresis, fatigue, fever and unexpected weight change.  HENT:  Negative for congestion.   Respiratory:  Negative for cough, shortness of breath and wheezing.   Cardiovascular:  Negative for chest pain, palpitations and leg swelling.  Gastrointestinal:  Negative for abdominal distention, abdominal pain, constipation and diarrhea.  Genitourinary:  Negative for difficulty urinating and dysuria.  Musculoskeletal:  Positive for gait problem. Negative for arthralgias, back pain, joint swelling and myalgias.  Skin:  Positive for wound.  Neurological:  Negative for dizziness, tremors, seizures, syncope, facial asymmetry, speech difficulty, weakness, light-headedness, numbness and headaches.  Psychiatric/Behavioral:  Negative for agitation, behavioral problems and confusion.    Immunization History  Administered Date(s) Administered   Influenza, High Dose Seasonal PF 09/01/2005   PFIZER(Purple Top)SARS-COV-2 Vaccination 12/02/2019, 12/26/2019   Tdap 07/11/2014   Pertinent  Health Maintenance Due  Topic Date Due   COLONOSCOPY (Pts 45-34yrs Insurance coverage will need to be confirmed)  Never done   MAMMOGRAM  Never done   DEXA SCAN  Never done   PNA vac Low Risk Adult (1 of 2 - PCV13) Never done   INFLUENZA  VACCINE  04/22/2021   No flowsheet data found. Functional Status Survey:    Vitals:   04/18/21 1418  BP: (!) 145/70  Pulse: 80  Resp: 18  Temp: 97.7 F (36.5 C)  SpO2: 99%  Weight: 125 lb 12.8 oz (57.1 kg)  Height: 5\' 7"  (1.702 m)   Body mass index is 19.7 kg/m. Physical Exam Vitals and nursing note reviewed.  Constitutional:      General: She is not in acute distress.    Appearance: She is not diaphoretic.  HENT:     Head: Normocephalic and atraumatic.  Neck:     Vascular: No JVD.  Cardiovascular:     Rate and Rhythm: Normal rate and regular rhythm.     Heart sounds: No murmur heard. Pulmonary:     Effort: Pulmonary effort is normal. No respiratory distress.     Breath sounds: Normal breath sounds. No wheezing.  Musculoskeletal:     Right lower leg: No edema.     Left lower leg: No edema.  Skin:    General: Skin is warm and dry.     Comments: Right great toe and foot covered with a wrap to be removed by Dr. next week. Left foot examined with normal pedal pulse and no ulceration.   Neurological:     Mental Status: She is alert and oriented to person, place, and time.    Labs reviewed: Recent Labs    03/19/21 0000 04/13/21 1825  NA 142 137  K 4.1 4.1  CL 101 100  CO2 27* 26  GLUCOSE  --  155*  BUN 35* 31*  CREATININE 1.0 1.03*  CALCIUM 9.8 9.5   Recent Labs    03/19/21 0000 04/13/21 1825  AST 29 19  ALT 24 9  ALKPHOS 99 79  BILITOT  --  0.4  PROT  --  7.3  ALBUMIN 4.4 3.8   Recent Labs    03/19/21 0000 04/13/21 1825  WBC 5.4 7.5  NEUTROABS  --  4.4  HGB 11.4* 10.7*  HCT 32* 32.0*  MCV  --  90.9  PLT 280 331   No results found for: TSH Lab Results  Component Value Date   HGBA1C 9.4 03/19/2021   Lab Results  Component Value Date   CHOL 179 03/19/2021   HDL 83 (A) 03/19/2021   LDLCALC 86 03/19/2021   TRIG 52 03/19/2021    Significant Diagnostic Results in last 30 days:  DG Foot Complete Right  Result Date:  04/13/2021 CLINICAL DATA:  Right great toe wound with. Early drainage. Diabetic patient. EXAM: RIGHT FOOT COMPLETE - 3+ VIEW COMPARISON:  None. FINDINGS: There is resorption of portions of the distal tuft of the right great toe consistent with osteomyelitis. No fracture.  No bone lesion.  Joints are normally aligned. Soft tissue swelling is noted of the distal great toe. Tiny bubbles of air are seen along the distal aspect of the great toe adjacent to the partly resorbed distal tuft. IMPRESSION: 1. Osteomyelitis of the distal tuft of the right great toe with surrounding soft tissue swelling and a few tiny bubbles of soft tissue air. Electronically Signed   By: Amie Portland M.D.   On: 04/13/2021 18:40    Assessment/Plan  1. 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 Amputation of right great toe (HCC) WBAT  PT F/U with Dr Lajoyce Corners Monitor foot closely Continue Doxy to complete the course   3. Type 1 diabetes mellitus with other circulatory complication (HCC) Currently on Humalog for meals and lantus. Has issues with low CBG Does carb counting.  A1C above goal at 9.4 Followed by endocrinology   4. Hyperlipidemia, unspecified hyperlipidemia type Continue Lipitor   5. Primary hypertension Controlled  Has proteinuria, on ace inhibitor.  Family/ staff Communication: resident   Labs/tests ordered:   NA

## 2021-04-18 NOTE — Progress Notes (Signed)
Pt was given script for norco from the hospital and it was faxed to the pharmacy but they never received it. Now it can not be located. Will re order. [facility] is at [facility] and lives in [place]. Orders for opiates will be monitored closely.

## 2021-04-18 NOTE — Telephone Encounter (Signed)
Sherri with [facility] would like an order stating that dressing on right LE can be removed for an ABI?  Patient had Right GRT Toe surgery on 04/17/2021.  Fax# [phone].  CB# [phone].  Please advise. Thank you.

## 2021-04-19 ENCOUNTER — Other Ambulatory Visit: Payer: Self-pay

## 2021-04-19 ENCOUNTER — Encounter: Payer: Self-pay | Admitting: Adult Health

## 2021-04-19 NOTE — Telephone Encounter (Signed)
Pt is s/p a right GT amp on 04/17/21 faxed note to advise ok to remove surgical dressing and apply dry dressing daily for ABI study. To call with questions. Faxed note to facility as requested.

## 2021-04-19 NOTE — Anesthesia Postprocedure Evaluation (Signed)
Anesthesia Post Note  Patient: Caitlin Sharp  Procedure(s) Performed: RIGHT GREAT TOE AMPUTATION (Right)     Patient location during evaluation: PACU Anesthesia Type: MAC Level of consciousness: awake and alert Pain management: pain level controlled Vital Signs Assessment: post-procedure vital signs reviewed and stable Respiratory status: spontaneous breathing, nonlabored ventilation, respiratory function stable and patient connected to nasal cannula oxygen Cardiovascular status: stable and blood pressure returned to baseline Postop Assessment: no apparent nausea or vomiting Anesthetic complications: no   No notable events documented.  Last Vitals:  Vitals:   04/17/21 1532 04/17/21 1547  BP: (!) 142/71 (!) 166/68  Pulse: 74 77  Resp: 12 10  Temp:  (!) 36.3 C  SpO2: 99% 98%    Last Pain:  Vitals:   04/17/21 1547  TempSrc:   PainSc: 0-No pain                 Belenda Cruise P Jayleena Stille

## 2021-04-22 ENCOUNTER — Encounter: Payer: Self-pay | Admitting: Internal Medicine

## 2021-04-22 ENCOUNTER — Non-Acute Institutional Stay (SKILLED_NURSING_FACILITY): Payer: Medicare Other | Admitting: Internal Medicine

## 2021-04-22 DIAGNOSIS — S98111A Complete traumatic amputation of right great toe, initial encounter: Secondary | ICD-10-CM | POA: Diagnosis not present

## 2021-04-22 DIAGNOSIS — E785 Hyperlipidemia, unspecified: Secondary | ICD-10-CM | POA: Diagnosis not present

## 2021-04-22 DIAGNOSIS — E1059 Type 1 diabetes mellitus with other circulatory complications: Secondary | ICD-10-CM

## 2021-04-22 DIAGNOSIS — I255 Ischemic cardiomyopathy: Secondary | ICD-10-CM

## 2021-04-22 DIAGNOSIS — I214 Non-ST elevation (NSTEMI) myocardial infarction: Secondary | ICD-10-CM

## 2021-04-22 DIAGNOSIS — R42 Dizziness and giddiness: Secondary | ICD-10-CM

## 2021-04-22 DIAGNOSIS — M810 Age-related osteoporosis without current pathological fracture: Secondary | ICD-10-CM

## 2021-04-22 NOTE — Progress Notes (Signed)
Provider:  Einar Crow MD Location:   [facility] Room Number: 155 Place of Service:  SNF ([id number])  PCP: [name], MD Patient Care Team: [name], MD as PCP - General (Internal Medicine) [name], MD as Referring Physician (Ophthalmology) Beverely Pace, [name], MD as Referring Physician (Cardiology) Bertram Gala, MD as Referring Physician (Cardiology)  Extended Emergency Contact Information Primary Emergency Contact: [name] Address: [address]          [place], [place] [address] [name] of [place] Home Phone: [phone] Mobile Phone: [phone] Relation: Son Preferred language: English Interpreter needed? No  Code Status: Full Code Goals of Care: Advanced Directive information Advanced Directives 04/17/2021  Does Patient Have a Medical Advance Directive? No  Would patient like information on creating a medical advance directive? No - Patient declined      Chief Complaint  Patient presents with   New Admit To SNF    Admission to SNF    HPI: Patient is a 70 y.o. female seen today for admission to SNF for therapy   Patient underwent Right Great Toe Amputation due to Osteomyelitis on 7/27  Also Was admitted in the hospital [place] in Jan of this year with DKA, NSTEMI, Enterococcus Bacremia, Acute DVT Cath no Stenosis Medical Management EF 40%   H/o Type 1 Diabetes For past 50 years. Has been on Pump but now Long Acting Insulin and Humalog now CAD S/o CABG in 2012 osteoporosis 2021 -2.7 Glaucoma with Poor Vision  Patient lives in [place] and was noticed ot have necrosis and ulcer in her Right great Toe. Was send to ED where Xray showed Osteomyelitis Was send to Dr Lajoyce Corners who preformed amputation on 7 27 Patient is in rehab since then and working with therapy Has mild pain but mostly comfortable.  Able to do her transfers.  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  Is walking on her heels to keep the  pressure off the wound She was treated for Doxy for 7 days No fever or chills Past Medical History:  Diagnosis Date   Abnormal thyroid blood test    Per Records recevied from [facility], Dr.Pearson   Acute deep vein thrombosis of right peroneal vein (HCC) 10/05/2020   RLE DVT   Anxiety    Arthritis    RA   Coronary artery disease    Diabetes type I [facility]    Per [facility] New Patient Packet   Diabetic retinopathy (HCC)    Per [facility] New Patient Packet   DKA (diabetic ketoacidosis) (HCC) 2021   Dr.William Zackowski.  Insulin Pump was out of Insulin   Glaucoma    Per [facility] New Patient Packet   H/O heart bypass surgery 2012   Dr. Excell Seltzer, Per [facility] New Patient Packet   Heart murmur    when I was a teenager   History of kidney stones    passed   Hx of CABG    Per records received form [facility], Dr.Pearson    Left bundle branch block    Mild mitral regurgitation    Per records from [facility], Dr.Pearson    Neuropathy 1976   Per [facility] New Patient Packet   NSTEMI (non-ST elevated myocardial infarction) (HCC) 09/21/2020   NSTEMI/demand ischemic in setting of DKA, patent grafts by 09/24/20 [facility], continue medical therapy   Osteoporosis    T Score -2.7 , Per [facility] New Patient Packet   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 Aquilla Hacker, Per [facility] New Patient Packet   CESAREAN SECTION  1973   Dr. Aquilla Hacker, Per [facility] New Patient Packet   CORONARY ARTERY BYPASS GRAFT      reports that she has never smoked. She has never used smokeless tobacco. She reports that she does not drink alcohol and does not use drugs. Social History    Socioeconomic History   Marital status: Widowed    Spouse name: Not on file   Number of children: Not on file   Years of education: Not on file   Highest education level: Not on file  Occupational History   Not on file  Tobacco Use   Smoking status: Never   Smokeless tobacco: Never  Vaping Use   Vaping Use: Never used  Substance and Sexual Activity   Alcohol use: Never   Drug use: Never   Sexual activity: Not on file  Other Topics Concern   Not on file  Social History Narrative   Per [facility] New Patient Packet Abstracted on 02/14/2021      Diet: Left blank       Caffeine: Yes      Married, if yes what year: Widowed, married in 1969      Do you live in a house, apartment, assisted living, condo, trailer, ect: Assisted Living       Is it one or more stories: 2      How many persons live in your home?One      Pets:No      Highest level or education completed:       Current/Past profession: Accountant       Exercise:        Yes          Type and how often: Walking 3-4 time weekly          Living Will: Yes   DNR: No   POA/HPOA: Yes      Social Determinants of Health   Financial Resource Strain: Not on file  Food Insecurity: Not on file  Transportation Needs: Not on file  Physical Activity: Not on file  Stress: Not on file  Social Connections: Not on file  Intimate Partner Violence: Not on file    Functional Status Survey:    Family History  Problem Relation Age of Onset   Congestive Heart Failure Mother        Per [facility] New Patient Packet   Arthritis Mother        Per [facility] New Patient Packet   Osteoporosis Mother        Per [facility] New Patient Packet   Heart attack Father        Per [facility] New Patient Packet   Diabetes Sister        Per [facility] New Patient Packet   Emphysema Brother        Per [facility] New Patient Packet   Heart disease Brother        Per [facility] New Patient Packet    Health Maintenance  Topic Date Due   Hepatitis C Screening  Never done    COLONOSCOPY (  Pts 45-3yrs Insurance coverage will need to be confirmed)  Never done   MAMMOGRAM  Never done   Zoster Vaccines- Shingrix (1 of 2) Never done   DEXA SCAN  Never done   PNA vac Low Risk Adult (1 of 2 - PCV13) Never done   COVID-19 Vaccine (3 - Booster for Pfizer series) 05/27/2020   INFLUENZA VACCINE  04/22/2021   TETANUS/TDAP  07/11/2024   HPV VACCINES  Aged Out    Allergies  Allergen Reactions   Penicillins Anaphylaxis    Allergies as of 04/22/2021       Reactions   Penicillins Anaphylaxis        Medication List        Accurate as of April 22, 2021 11:30 AM. If you have any questions, ask your nurse or doctor.          Accu-Chek Softclix Lancets lancets USE 1 TO CHECK GLUCOSE THREE TIMES DAILY   aspirin EC 81 MG tablet Take 81 mg by mouth in the morning. Swallow whole.   atorvastatin 40 MG tablet Commonly known as: LIPITOR Take 40 mg by mouth at bedtime.   brimonidine 0.2 % ophthalmic solution Commonly known as: ALPHAGAN Place 1 drop into both eyes 3 (three) times daily.   CALCIUM-CARB 600 PO Take 600 mg by mouth in the morning.   clopidogrel 75 MG tablet Commonly known as: PLAVIX Take 75 mg by mouth in the morning.   doxycycline 100 MG capsule Commonly known as: VIBRAMYCIN Take 1 capsule (100 mg total) by mouth 2 (two) times daily.   Fish Oil 1000 MG Caps Take 1,000 mg by mouth in the morning.   furosemide 20 MG tablet Commonly known as: LASIX Take 20 mg by mouth in the morning.   glucosamine-chondroitin 500-400 MG tablet Take 1 tablet by mouth every morning.   glucose blood test strip 1 each by Other route as needed for other. Accu-chek   HAIR/SKIN/NAILS PO Take 1 tablet by mouth in the morning.   HYDROcodone-acetaminophen 5-325 MG tablet Commonly known as: NORCO/VICODIN Take 1 tablet by mouth every 4 (four) hours as needed for moderate pain.   I-VITE PO Take 1 tablet by mouth in the morning.   insulin lispro 100 UNIT/ML  KwikPen Commonly known as: HUMALOG Inject 2-10 Units into the skin 3 (three) times daily.   Lantus SoloStar 100 UNIT/ML Solostar Pen Generic drug: insulin glargine Inject 12 Units into the skin at bedtime.   latanoprost 0.005 % ophthalmic solution Commonly known as: XALATAN Place 1 drop into both eyes at bedtime. (2100)   lisinopril 2.5 MG tablet Commonly known as: ZESTRIL Take 2.5 mg by mouth in the morning.   nitroGLYCERIN 0.4 MG SL tablet Commonly known as: NITROSTAT Place 0.4 mg under the tongue every 5 (five) minutes x 3 doses as needed for chest pain.   Pen Needles 3/16" 31G X 5 MM Misc 1 Device by Does not apply route 5 (five) times daily. With insulin administration   sennosides-docusate sodium 8.6-50 MG tablet Commonly known as: SENOKOT-S Take 1 tablet by mouth every other day. IN THE MORNING   timolol 0.5 % ophthalmic solution Commonly known as: TIMOPTIC Place 1 drop into the left eye in the morning and at bedtime.        Review of Systems Review of Systems  Constitutional: Negative for activity change, appetite change, chills, diaphoresis, fatigue and fever.  HENT: Negative for mouth sores, postnasal drip, rhinorrhea, sinus pain and sore throat.  Respiratory: Negative for apnea, cough, chest tightness, shortness of breath and wheezing.   Cardiovascular: Negative for chest pain, palpitations and leg swelling.  Gastrointestinal: Negative for abdominal distention, abdominal pain, constipation, diarrhea, nausea and vomiting.  Genitourinary: Negative for dysuria and frequency.  Musculoskeletal: Negative for arthralgias, joint swelling and myalgias.  Skin: Negative for rash.  Neurological: Negative for dizziness, syncope, weakness, light-headedness and numbness.  Psychiatric/Behavioral: Negative for behavioral problems, confusion and sleep disturbance.    Vitals:   04/22/21 1112  BP: 132/77  Pulse: 82  Resp: 18  Temp: 98.7 F (37.1 C)  SpO2: 100%   Weight: 125 lb (56.7 kg)  Height:  (1.702 m)   Body mass index is 19.58 kg/m. Physical Exam Constitutional: Well-developed and well-nourished.  HENT:  Head: Normocephalic.  Mouth/Throat: Oropharynx is clear and moist.  Eyes: Pupils are equal, round, and reactive to light.  Neck: Neck supple.  Cardiovascular: Normal rate and normal heart sounds.  No murmur heard. Pulmonary/Chest: Effort normal and breath sounds normal. No respiratory distress. No wheezes. She has no rales.  Abdominal: Soft. Bowel sounds are normal. No distension. There is no tenderness. There is no rebound.  Musculoskeletal: No Edema Right leg in Dressing  Lymphadenopathy: none Neurological: Alert and oriented to person, place, and time.  Skin: Skin is warm and dry.  Psychiatric: Normal mood and affect. Behavior is normal. Thought content normal.   Labs reviewed: Basic Metabolic Panel: Recent Labs    03/19/21 0000 04/13/21 1825  NA 142 137  K 4.1 4.1  CL 101 100  CO2 27* 26  GLUCOSE  --  155*  BUN 35* 31*  CREATININE 1.0 1.03*  CALCIUM 9.8 9.5   Liver Function Tests: Recent Labs    03/19/21 0000 04/13/21 1825  AST 29 19  ALT 24 9  ALKPHOS 99 79  BILITOT  --  0.4  PROT  --  7.3  ALBUMIN 4.4 3.8   No results for input(s): LIPASE, AMYLASE in the last 8760 hours. No results for input(s): AMMONIA in the last 8760 hours. CBC: Recent Labs    03/19/21 0000 04/13/21 1825  WBC 5.4 7.5  NEUTROABS  --  4.4  HGB 11.4* 10.7*  HCT 32* 32.0*  MCV  --  90.9  PLT 280 331   Cardiac Enzymes: No results for input(s): CKTOTAL, CKMB, CKMBINDEX, TROPONINI in the last 8760 hours. BNP: Invalid input(s): POCBNP Lab Results  Component Value Date   HGBA1C 9.4 03/19/2021   No results found for: TSH No results found for: VITAMINB12 No results found for: FOLATE No results found for: IRON, TIBC, FERRITIN  Imaging and Procedures obtained prior to SNF admission: No results  found.  Assessment/Plan Amputation of right great toe [facility]) Working with therapy for her balance Pain Seems Controlled On Doxycyline for 7 days ABI ordered pending for her Right  Leg Left Leg it was normal ABI Follow up with Dr Lajoyce Corners Type 1 diabetes mellitus with other circulatory complication (HCC) On  Lantus and Humalog Manages herself Some CBG more then 400 Has Appointment with her Endocrinologist in [facility]  Hyperlipidemia, unspecified hyperlipidemia type On statin Dizziness Resolved and BP better since off Beta blocker and dose of lisinopril reduced   NSTEMI (non-ST elevated myocardial infarction) (HCC)in Jan in setting of DKA Per Notes from her PCP Is on Dual Therapy  Cardiac Cath was negative Needs follow up with her Cardiology   Ischemic cardiomyopathy On Lasix and Lisinopril Could not tolerate Higher doses of  Lisinopril or Beta blockers due to Low BP and Dizziness Age-related osteoporosis without current pathological fracture Will Need repeat DEXA in 2023.  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

## 2021-04-23 ENCOUNTER — Non-Acute Institutional Stay (SKILLED_NURSING_FACILITY): Payer: Medicare Other | Admitting: Orthopedic Surgery

## 2021-04-23 ENCOUNTER — Encounter: Payer: Self-pay | Admitting: Orthopedic Surgery

## 2021-04-23 DIAGNOSIS — E1059 Type 1 diabetes mellitus with other circulatory complications: Secondary | ICD-10-CM | POA: Diagnosis not present

## 2021-04-23 DIAGNOSIS — E785 Hyperlipidemia, unspecified: Secondary | ICD-10-CM | POA: Diagnosis not present

## 2021-04-23 DIAGNOSIS — M810 Age-related osteoporosis without current pathological fracture: Secondary | ICD-10-CM

## 2021-04-23 DIAGNOSIS — S98111A Complete traumatic amputation of right great toe, initial encounter: Secondary | ICD-10-CM | POA: Diagnosis not present

## 2021-04-23 DIAGNOSIS — I255 Ischemic cardiomyopathy: Secondary | ICD-10-CM

## 2021-04-23 DIAGNOSIS — I214 Non-ST elevation (NSTEMI) myocardial infarction: Secondary | ICD-10-CM

## 2021-04-23 NOTE — Progress Notes (Signed)
Location:  [facility] Room Number: 155-A Place of Service:  SNF ([phone])  Provider: Octavia Heir, NP   PCP: [name], MD Patient Care Team: [name], MD as PCP - General (Internal Medicine) [name], MD as Referring Physician (Ophthalmology) Beverely Pace, [name], MD as Referring Physician (Cardiology) Bertram Gala, MD as Referring Physician (Cardiology)  Extended Emergency Contact Information Primary Emergency Contact: [name] Address: [address]          [place], [place] [address] [name] of [place] Home Phone: [phone] Mobile Phone: [phone] Relation: Son Preferred language: English Interpreter needed? No  Code Status: Full Code  Goals of care:  Advanced Directive information Advanced Directives 04/23/2021  Does Patient Have a Medical Advance Directive? Yes  Type of Advance Directive Healthcare Power of Attorney  Does patient want to make changes to medical advance directive? No - Patient declined  Copy of Healthcare Power of Attorney in Chart? Yes - validated most recent copy scanned in chart (See row information)  Would patient like information on creating a medical advance directive? -     Allergies  Allergen Reactions   Penicillins Anaphylaxis    Chief Complaint  Patient presents with   Discharge Note    Discharge from [facility]     HPI:  70 y.o. Sharp seen today for discharge evaluation.   She currently resides on the rehabilitation unit at [facility]. PAst medical history includes: type 1 diabetes, osteomyelitis.   She reports self managing a ulcer to right toe for the last few weeks. She had been applying alcohol to ulcer and wearing close toe shoes in an effort to promote healing. 07/27 she underwent amputation of right great toe by Dr. Aldean Baker. She was discharged to [facility] rehabilitation for additional nursing services 07/28. During her stay her pain has been  minimal. She has not received hydrocodone within the last 24 hours. Ambulating with a cane or walker, she refused PT/OT evaluation. Eating three meals daily. Last bowel movement 04/22/2021. Blood sugars elevated > 300 from 07/28-07/31. She has been checking her own blood sugars and administering insulin. Aspiring and Plavix restarted while in rehab. Remains on doxycycline 100 mg bid x10 days, last day 08/06. She will follow up with Dr. Lajoyce Corners 08/04.   No recent falls, injuries or behavioral outbursts.   She plans to return to her apartment in [place] this afternoon. She is requesting front wheeled walker at discharge.    Past Medical History:  Diagnosis Date   Abnormal thyroid blood test    Per Records recevied from [facility], Dr.Pearson   Acute deep vein thrombosis of right peroneal vein (HCC) 10/05/2020   RLE DVT   Anxiety    Arthritis    RA   Coronary artery disease    Diabetes type I [facility])    Per [facility] New Patient Packet   Diabetic retinopathy (HCC)    Per [facility] New Patient Packet   DKA (diabetic ketoacidosis) (HCC) 2021   Dr.William Zackowski.  Insulin Pump was out of Insulin   Glaucoma    Per [facility] New Patient Packet   H/O heart bypass surgery 2012   Dr. Excell Seltzer, Per [facility] New Patient Packet   Heart murmur    when I was a teenager   History of kidney stones    passed   Hx of CABG    Per records received form [facility], Dr.Pearson    Left bundle branch block  Mild mitral regurgitation    Per [facility] from [facility], Dr.Pearson    Neuropathy 1976   Per [facility] New Patient Packet   NSTEMI (non-ST elevated myocardial infarction) (HCC) 09/21/2020   NSTEMI/demand ischemic in setting of DKA, patent grafts by 09/24/20 [facility], continue medical therapy   Osteoporosis    T Score -2.7 , Per [facility] New Patient Packet   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 Aquilla Hacker, Per [facility] New Patient Packet   CESAREAN SECTION  1973   Dr. Aquilla Hacker, Per [facility] New Patient Packet   CORONARY ARTERY BYPASS GRAFT        reports that she has never smoked. She has never used smokeless tobacco. She reports that she does not drink alcohol and does not use drugs. Social History   Socioeconomic History   Marital status: Widowed    Spouse name: Not on file   Number of children: Not on file   Years of education: Not on file   Highest education level: Not on file  Occupational History   Not on file  Tobacco Use   Smoking status: Never   Smokeless tobacco: Never  Vaping Use   Vaping Use: Never used  Substance and Sexual Activity   Alcohol use: Never   Drug use: Never   Sexual activity: Not on file  Other Topics Concern   Not on file  Social History Narrative   Per [facility] New Patient Packet Abstracted on 02/14/2021      Diet: Left blank       Caffeine: Yes      Married, if yes what year: Widowed, married in 1969      Do you live in a house, apartment, assisted living, condo, trailer, ect: Assisted Living       Is it one or more stories: 2      How many persons live in your home?One      Pets:No      Highest level or education completed:       Current/Past profession: Accountant       Exercise:        Yes          Type and how often: Walking 3-4 time weekly          Living Will: Yes   DNR: No   POA/HPOA: Yes      Social Determinants of Corporate investment banker Strain: Not on file  Food Insecurity: Not on file  Transportation Needs: Not on file  Physical Activity: Not on file  Stress: Not on file  Social Connections: Not on file  Intimate Partner Violence: Not on file   Functional Status Survey:     Allergies  Allergen Reactions   Penicillins Anaphylaxis    Pertinent  Health Maintenance Due  Topic Date Due   COLONOSCOPY (Pts 45-72yrs Insurance coverage will need to be confirmed)  Never done   MAMMOGRAM  Never done   DEXA SCAN  Never done   PNA vac Low Risk Adult (1 of 2 - PCV13) Never done   INFLUENZA VACCINE  04/22/2021  Medications: Outpatient Encounter Medications as of 04/23/2021  Medication Sig   aspirin EC 81 MG tablet Take 81 mg by mouth in the morning. Swallow whole.   atorvastatin (LIPITOR) 40 MG tablet Take 40 mg by mouth at bedtime.   Biotin w/ Vitamins C & E (HAIR/SKIN/NAILS PO) Take 1 tablet by mouth in the morning.   brimonidine (ALPHAGAN) 0.2 % ophthalmic solution Place 1 drop into both eyes 3 (three) times daily.   Calcium Carbonate (CALCIUM-CARB 600 PO) Take 600 mg by mouth in the morning.   clopidogrel (PLAVIX) 75 MG tablet Take 75 mg by mouth in the morning.   furosemide (LASIX) 20 MG tablet Take 20 mg by mouth in the morning.   glucosamine-chondroitin 500-400 MG tablet Take 1 tablet by mouth every morning.   HYDROcodone-acetaminophen (NORCO/VICODIN) 5-325 MG tablet Take 1 tablet by mouth every 4 (four) hours as needed for moderate pain.   insulin glargine (LANTUS SOLOSTAR) 100 UNIT/ML Solostar Pen Inject 12 Units into the skin at bedtime.   insulin lispro (HUMALOG) 100 UNIT/ML KwikPen Inject 2-10 Units into the skin 3 (three) times daily.   Insulin Pen Needle (PEN NEEDLES 3/16") 31G X 5 MM MISC 1 Device by Does not apply route 5 (five) times daily. With insulin administration   latanoprost (XALATAN) 0.005 % ophthalmic solution Place 1 drop into both eyes at bedtime. (2100)   lisinopril (ZESTRIL) 2.5 MG tablet Take 2.5 mg by mouth in the morning.   Multiple Vitamins-Minerals (I-VITE PO) Take 1 tablet by mouth in the morning.   nitroGLYCERIN (NITROSTAT) 0.4 MG SL tablet Place 0.4 mg under the tongue every 5 (five) minutes x 3 doses as needed for chest pain.    Omega-3 Fatty Acids (FISH OIL) 1000 MG CAPS Take 1,000 mg by mouth in the morning.   sennosides-docusate sodium (SENOKOT-S) 8.6-50 MG tablet Take 1 tablet by mouth every other day. IN THE MORNING   timolol (TIMOPTIC) 0.5 % ophthalmic solution Place 1 drop into the left eye in the morning and at bedtime.   Accu-Chek Softclix Lancets lancets USE 1 TO CHECK GLUCOSE THREE TIMES DAILY   glucose blood test strip 1 each by Other route as needed for other. Accu-chek   [DISCONTINUED] doxycycline (VIBRAMYCIN) 100 MG capsule Take 1 capsule (100 mg total) by mouth 2 (two) times daily.   No facility-administered encounter medications on file as of 04/23/2021.    Review of Systems  Constitutional:  Negative for activity change, appetite change, fatigue and fever.  Respiratory:  Negative for cough, shortness of breath and wheezing.   Cardiovascular:  Negative for chest pain and leg swelling.  Gastrointestinal:  Negative for abdominal distention, abdominal pain, constipation, diarrhea and nausea.  Endocrine: Negative for polydipsia, polyphagia and polyuria.  Genitourinary:  Negative for dysuria, frequency and hematuria.  Musculoskeletal:  Positive for gait problem.       Right toe discomfort  Skin:        Surgical incision  Neurological:  Negative for dizziness, weakness and headaches.  Hematological:  Bruises/bleeds easily.  Psychiatric/Behavioral:  Negative for dysphoric mood. The patient is not nervous/anxious.    Vitals:   04/23/21 1021  BP: (!) 153/83  Pulse: 76  Resp: 18  Temp: 99 F (37.2 C)  SpO2: 99%  Weight: 125 lb (56.7 kg)  Height: 5\' 7"  (1.702 m)   Body mass index is 19.58 kg/m. Physical Exam Vitals reviewed.  Constitutional:      General: She is not in acute distress. HENT:  Head: Normocephalic.  Cardiovascular:     Rate and Rhythm: Normal rate and regular rhythm.     Pulses: Normal pulses.     Heart sounds: Normal heart sounds. No murmur heard. Pulmonary:      Effort: Pulmonary effort is normal. No respiratory distress.     Breath sounds: Normal breath sounds. No wheezing.  Abdominal:     General: Abdomen is flat. Bowel sounds are normal. There is no distension.     Palpations: Abdomen is soft.     Tenderness: There is no abdominal tenderness.  Musculoskeletal:     Right lower leg: No edema.     Left lower leg: No edema.     Comments: Right foot dressing, CDI. Toes on right foot normal color, sensation and movement, cap refill < 3 sec. Post op recovery shoe in place.   Skin:    General: Skin is warm and dry.     Capillary Refill: Capillary refill takes less than 2 seconds.  Neurological:     General: No focal deficit present.     Mental Status: She is alert and oriented to person, place, and time.     Motor: Weakness present.     Gait: Gait abnormal.     Comments: cane  Psychiatric:        Mood and Affect: Mood normal.        Behavior: Behavior normal.    Labs reviewed: Basic Metabolic Panel: Recent Labs    03/19/21 0000 04/13/21 1825  NA 142 137  K 4.1 4.1  CL 101 100  CO2 27* 26  GLUCOSE  --  155*  BUN 35* 31*  CREATININE 1.0 1.03*  CALCIUM 9.8 9.5   Liver Function Tests: Recent Labs    03/19/21 0000 04/13/21 1825  AST 29 19  ALT 24 9  ALKPHOS 99 79  BILITOT  --  0.4  PROT  --  7.3  ALBUMIN 4.4 3.8   No results for input(s): LIPASE, AMYLASE in the last 8760 hours. No results for input(s): AMMONIA in the last 8760 hours. CBC: Recent Labs    03/19/21 0000 04/13/21 1825  WBC 5.4 7.5  NEUTROABS  --  4.4  HGB 11.4* 10.7*  HCT 32* 32.0*  MCV  --  90.9  PLT 280 331   Cardiac Enzymes: No results for input(s): CKTOTAL, CKMB, CKMBINDEX, TROPONINI in the last 8760 hours. BNP: Invalid input(s): POCBNP CBG: Recent Labs    04/17/21 1203 04/17/21 1407 04/17/21 1519  GLUCAP 132* 122* 149*    Procedures and Imaging Studies During Stay: DG Foot Complete Right  Result Date: 04/13/2021 CLINICAL DATA:  Right  great toe wound with. Early drainage. Diabetic patient. EXAM: RIGHT FOOT COMPLETE - 3+ VIEW COMPARISON:  None. FINDINGS: There is resorption of portions of the distal tuft of the right great toe consistent with osteomyelitis. No fracture.  No bone lesion.  Joints are normally aligned. Soft tissue swelling is noted of the distal great toe. Tiny bubbles of air are seen along the distal aspect of the great toe adjacent to the partly resorbed distal tuft. IMPRESSION: 1. Osteomyelitis of the distal tuft of the right great toe with surrounding soft tissue swelling and a few tiny bubbles of soft tissue air. Electronically Signed   By: Amie Portland M.D.   On: 04/13/2021 18:40    Assessment/Plan:   1. Amputation of right great toe [facility]) - followed by Dr. Lajoyce Corners- next f/u 08/04 - pain minimal, ambulating with cane -  surgical dressing CDI  - cont doxycycline 100 mg po bid - left leg ABI- normal  - right leg ABI pending  2. Type 1 diabetes mellitus with other circulatory complication (HCC) - A1c 9.4 03/19/2021 - blood sugars elevated 07/28-07-31 have improved - manages taking her sugars and administering insulin - remains on lantus and humalog - appointment scheduled with endocrinology/ [facility] soon  3. Hyperlipidemia, unspecified hyperlipidemia type - LDL 86 03/19/2021 - cont statin  4. Age-related osteoporosis without current pathological fracture - plan for DEXA in 2023 - remains on calcium and vitamin D  5. NSTEMI (non-ST elevated myocardial infarction) (HCC) - occurred 09/2020 with DKA - remains on asa and plavix - normal cardiac cath - schedule cardiac f/u   6. Ischemic cardiomyopathy - remains on lisinopril and lasix  Patient is being discharged with the following home health services:  Nursing  Patient is being discharged with the following durable medical equipment:  walker  Patient has been advised to f/u with their PCP in 1-2 weeks to for a transitions of care visit.   Social services at their facility was responsible for arranging this appointment.  Pt was provided with adequate prescriptions of noncontrolled medications to reach the scheduled appointment .  For controlled substances, a limited supply was provided as appropriate for the individual patient.  If the pt normally receives these medications from a pain clinic or has a contract with another physician, these medications should be received from that clinic or physician only).    Future labs/tests needed:  TSH scheduled 06/10/2021

## 2021-04-25 ENCOUNTER — Encounter: Payer: Self-pay | Admitting: Orthopedic Surgery

## 2021-04-25 ENCOUNTER — Other Ambulatory Visit: Payer: Self-pay

## 2021-04-25 ENCOUNTER — Telehealth: Payer: Self-pay | Admitting: Orthopedic Surgery

## 2021-04-25 ENCOUNTER — Ambulatory Visit (INDEPENDENT_AMBULATORY_CARE_PROVIDER_SITE_OTHER): Payer: Medicare Other | Admitting: Physician Assistant

## 2021-04-25 DIAGNOSIS — M869 Osteomyelitis, unspecified: Secondary | ICD-10-CM

## 2021-04-25 NOTE — Telephone Encounter (Signed)
Received call from Darl Pikes --Facilities manager with [facility] needing clarification on how much patient can be up and walking. Salimata said per the understanding from patient's son patient can be up and walking 1 Hr. Per day. Magdala said that is the understanding patient's son got. The number to contact Darl Pikes or Clydie Braun - both nurse managers is [phone]

## 2021-04-25 NOTE — Progress Notes (Signed)
Office Visit Note   Patient: Caitlin Sharp           Date of Birth: [birth year]           MRN: [id number] Visit Date: 04/25/2021              Requested by: No referring provider defined for this encounter. PCP: [name], MD  Chief Complaint  Patient presents with   Right Foot - Follow-up    Right  GTA      HPI: Patient presents today 1 week status post great toe amputation.  She is back at her assisted living apartment.  She is accompanied today by her son.  She does not have any complaints.  Assessment & Plan: Visit Diagnoses: No diagnosis found.  Plan: Patient may begin doing daily dressing changes and cleansing.  She does have a nurse available to her to help her with this.  Follow-up in 1 week.  Follow-Up Instructions: No follow-ups on file.   Ortho Exam  Patient is alert, oriented, no adenopathy, well-dressed, normal affect, normal respiratory effort. Examination demonstrates well apposed wound edges surgical sutures are in place.  She does have some bleeding from the wound but is not brisk.  Swelling is well controlled no ascending cellulitis she does have a palpable dorsalis pedis pulse  Imaging: No results found. No images are attached to the encounter.  Labs: Lab Results  Component Value Date   HGBA1C 9.4 03/19/2021   REPTSTATUS 04/18/2021 FINAL 04/13/2021   REPTSTATUS 04/18/2021 FINAL 04/13/2021   CULT  04/13/2021    NO GROWTH 5 DAYS Performed at [facility], [address], [place], [place] [address]    CULT  04/13/2021    NO GROWTH 5 DAYS Performed at [facility], [address], [place], [place] [address]      Lab Results  Component Value Date   ALBUMIN 3.8 04/13/2021   ALBUMIN 4.4 03/19/2021    No results found for: MG No results found for: VD25OH  No results found for: PREALBUMIN CBC EXTENDED Latest Ref Rng & Units 04/13/2021 03/19/2021  WBC 4.0 - 10.5 K/uL 7.5 5.4  RBC 3.87 - 5.11 MIL/uL 3.52(L) 3.58(A)  HGB  12.0 - 15.0 g/dL 10.7(L) 11.4(A)  HCT 36.0 - 46.0 % 32.0(L) 32(A)  PLT 150 - 400 K/uL 331 280  NEUTROABS 1.7 - 7.7 K/uL 4.4 -  LYMPHSABS 0.7 - 4.0 K/uL 2.1 -     There is no height or weight on file to calculate BMI.  Orders:  No orders of the defined types were placed in this encounter.  No orders of the defined types were placed in this encounter.    Procedures: No procedures performed  Clinical Data: No additional findings.  ROS:  All other systems negative, except as noted in the HPI. Review of Systems  Objective: Vital Signs: There were no vitals taken for this visit.  Specialty Comments:  No specialty comments available.  PMFS History: Patient Active Problem List   Diagnosis Date Noted   Osteomyelitis of great toe of right foot [facility])    Past Medical History:  Diagnosis Date   Abnormal thyroid blood test    Per Records recevied from [facility], Dr.Pearson   Acute deep vein thrombosis of right peroneal vein (HCC) 10/05/2020   RLE DVT   Anxiety    Arthritis    RA   Coronary artery disease    Diabetes type I (HCC)  Per [facility] New Patient Packet   Diabetic retinopathy [facility])    Per [facility] New Patient Packet   DKA (diabetic ketoacidosis) (HCC) 2021   Dr.William Zackowski.  Insulin Pump was out of Insulin   Glaucoma    Per [facility] New Patient Packet   H/O heart bypass surgery 2012   Dr. Excell Seltzer, Per [facility] New Patient Packet   Heart murmur    when I was a teenager   History of kidney stones    passed   Hx of CABG    Per records received form [facility], Dr.Pearson    Left bundle branch block    Mild mitral regurgitation    Per records from [facility], Dr.Pearson    Neuropathy 1976   Per [facility] New Patient Packet   NSTEMI (non-ST elevated myocardial infarction) (HCC) 09/21/2020   NSTEMI/demand ischemic in setting of DKA, patent grafts by 09/24/20 [facility], continue medical therapy   Osteoporosis    T Score -2.7 , Per [facility] New Patient Packet   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 due to underlying condition (HCC)    Per Records recevied from [facility], Dr.Pearson    Family History  Problem Relation Age of Onset   Congestive Heart Failure Mother        Per [facility] New Patient Packet   Arthritis Mother        Per [facility] New Patient Packet   Osteoporosis Mother        Per [facility] New Patient Packet   Heart attack Father        Per [facility] New Patient Packet   Diabetes Sister        Per [facility] New Patient Packet   Emphysema Brother        Per [facility] New Patient Packet   Heart disease Brother        Per [facility] New Patient Packet    Past Surgical History:  Procedure Laterality Date   AMPUTATION Right 04/17/2021   Procedure: RIGHT GREAT TOE AMPUTATION;  Surgeon: [name], MD;  Location: [facility] OR;  Service: Orthopedics;  Laterality: Right;   CESAREAN SECTION  1970   Dr. Aquilla Hacker, Per [facility] New Patient Packet   CESAREAN SECTION  1973   Dr. Aquilla Hacker, Per [facility] New Patient Packet   CORONARY ARTERY BYPASS GRAFT     Social History   Occupational History   Not on file  Tobacco Use   Smoking status: Never   Smokeless tobacco: Never  Vaping Use   Vaping Use: Never used  Substance and Sexual Activity   Alcohol use: Never   Drug use: Never   Sexual activity: Not on file

## 2021-04-26 NOTE — Telephone Encounter (Signed)
You saw pt in office yesterday is this ok?

## 2021-04-26 NOTE — Telephone Encounter (Signed)
Called and sw Caitlin Sharp at the facility and advised the instructions are correct. To call with any other questions.

## 2021-04-26 NOTE — Telephone Encounter (Signed)
Yes

## 2021-04-29 ENCOUNTER — Encounter: Payer: Self-pay | Admitting: Internal Medicine

## 2021-04-29 ENCOUNTER — Non-Acute Institutional Stay: Payer: Medicare Other | Admitting: Internal Medicine

## 2021-04-29 DIAGNOSIS — I214 Non-ST elevation (NSTEMI) myocardial infarction: Secondary | ICD-10-CM

## 2021-04-29 DIAGNOSIS — E1059 Type 1 diabetes mellitus with other circulatory complications: Secondary | ICD-10-CM

## 2021-04-29 DIAGNOSIS — T8141XA Infection following a procedure, superficial incisional surgical site, initial encounter: Secondary | ICD-10-CM

## 2021-04-29 DIAGNOSIS — S98111A Complete traumatic amputation of right great toe, initial encounter: Secondary | ICD-10-CM

## 2021-04-29 DIAGNOSIS — I255 Ischemic cardiomyopathy: Secondary | ICD-10-CM

## 2021-04-29 DIAGNOSIS — E785 Hyperlipidemia, unspecified: Secondary | ICD-10-CM | POA: Diagnosis not present

## 2021-04-29 DIAGNOSIS — M810 Age-related osteoporosis without current pathological fracture: Secondary | ICD-10-CM

## 2021-04-29 NOTE — Progress Notes (Signed)
Location: [facility] Room Number: 620 Place of Service:  ALF [phone])  Provider: Einar Crow MD  Code Status: DNR Goals of Care:  Advanced Directives 04/29/2021  Does Patient Have a Medical Advance Directive? Yes  Type of Advance Directive Healthcare Power of Attorney  Does patient want to make changes to medical advance directive? No - Patient declined  Copy of Healthcare Power of Attorney in Chart? Yes - validated most recent copy scanned in chart (See row information)  Would patient like information on creating a medical advance directive? -     Chief Complaint  Patient presents with   Acute Visit    Redness in foot     HPI: Patient is a 70 y.o. female seen today for an acute visit for Redness and Swelling in  her Amputation incision site  Patient underwent Right Great Toe Amputation due to Osteomyelitis on 7/27   Also Was admitted in the hospital [place] in Jan of this year with DKA, NSTEMI, Enterococcus Bacremia, Acute DVT Cath no Stenosis Medical Management EF 40%   H/o Type 1 Diabetes For past 50 years. Has been on Pump but now Long Acting Insulin and Humalog now CAD S/o CABG in 2012 osteoporosis 2021 -2.7 Glaucoma with Poor Vision    Seen to have Redness and tender in her incision site Patient still has sutures and Dr Lajoyce Corners office is planning to remove then in 2 days She has no fever or Chills CBG are averaging around 350 She manages them her self No worsening in swelling in her legs  Past Medical History:  Diagnosis Date   Abnormal thyroid blood test    Per Records recevied [facility], Dr.Pearson   Acute deep vein thrombosis of right peroneal vein (HCC) 10/05/2020   RLE DVT   Anxiety    Arthritis    RA   Coronary artery disease    Diabetes type I [facility])    Per [facility] New Patient Packet   Diabetic retinopathy (HCC)    Per [facility] New Patient Packet   DKA (diabetic ketoacidosis) (HCC) 2021   Dr.William Zackowski.   Insulin Pump was out of Insulin   Glaucoma    Per [facility] New Patient Packet   H/O heart bypass surgery 2012   Dr. Excell Seltzer, Per [facility] New Patient Packet   Heart murmur    when I was a teenager   History of kidney stones    passed   Hx of CABG    Per records received form [facility], Dr.Pearson    Left bundle branch block    Mild mitral regurgitation    Per records from [facility], Dr.Pearson    Neuropathy 1976   Per [facility] New Patient Packet   NSTEMI (non-ST elevated myocardial infarction) (HCC) 09/21/2020   NSTEMI/demand ischemic in setting of DKA, patent grafts by 09/24/20 LHC, continue medical therapy   Osteoporosis    T Score -2.7 , Per [facility] New Patient Packet   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 Aquilla Hacker, Per [facility] New Patient Packet   CESAREAN SECTION  [phone]  Dr. Aquilla Hacker, Per [facility] New Patient Packet   CORONARY ARTERY BYPASS GRAFT      Allergies  Allergen Reactions   Penicillins Anaphylaxis    Outpatient Encounter Medications as of 04/29/2021  Medication Sig   Accu-Chek Softclix Lancets lancets USE 1 TO CHECK GLUCOSE THREE TIMES DAILY   aspirin EC 81 MG tablet Take 81 mg by mouth in the morning. Swallow whole.   atorvastatin (LIPITOR) 40 MG tablet Take 40 mg by mouth at bedtime.   Biotin w/ Vitamins C & E (HAIR/SKIN/NAILS PO) Take 1 tablet by mouth in the morning.   brimonidine (ALPHAGAN) 0.2 % ophthalmic solution Place 1 drop into both eyes 3 (three) times daily.   Calcium Carbonate (CALCIUM-CARB 600 PO) Take 600 mg by mouth in the morning.   clopidogrel (PLAVIX) 75 MG tablet Take 75 mg by mouth in the  morning.   doxycycline (VIBRAMYCIN) 100 MG capsule Take 100 mg by mouth 2 (two) times daily.   furosemide (LASIX) 20 MG tablet Take 20 mg by mouth in the morning.   glucosamine-chondroitin 500-400 MG tablet Take 1 tablet by mouth every morning.   glucose blood test strip 1 each by Other route as needed for other. Accu-chek   HYDROcodone-acetaminophen (NORCO/VICODIN) 5-325 MG tablet Take 1 tablet by mouth every 4 (four) hours as needed for moderate pain.   insulin glargine (LANTUS SOLOSTAR) 100 UNIT/ML Solostar Pen Inject 12 Units into the skin at bedtime.   insulin lispro (HUMALOG) 100 UNIT/ML KwikPen Inject 2-10 Units into the skin 3 (three) times daily.   Insulin Pen Needle (PEN NEEDLES 3/16") 31G X 5 MM MISC 1 Device by Does not apply route 5 (five) times daily. With insulin administration   latanoprost (XALATAN) 0.005 % ophthalmic solution Place 1 drop into both eyes at bedtime. (2100)   lisinopril (ZESTRIL) 2.5 MG tablet Take 2.5 mg by mouth in the morning.   Multiple Vitamins-Minerals (I-VITE PO) Take 1 tablet by mouth in the morning.   nitroGLYCERIN (NITROSTAT) 0.4 MG SL tablet Place 0.4 mg under the tongue every 5 (five) minutes x 3 doses as needed for chest pain.   Omega-3 Fatty Acids (FISH OIL) 1000 MG CAPS Take 1,000 mg by mouth in the morning.   sennosides-docusate sodium (SENOKOT-S) 8.6-50 MG tablet Take 1 tablet by mouth every other day. IN THE MORNING   timolol (TIMOPTIC) 0.5 % ophthalmic solution Place 1 drop into the left eye in the morning and at bedtime.   No facility-administered encounter medications on file as of 04/29/2021.    Review of Systems:  Review of Systems  Constitutional:  Positive for activity change.  HENT: Negative.    Respiratory: Negative.    Cardiovascular: Negative.   Gastrointestinal: Negative.   Genitourinary: Negative.   Musculoskeletal:  Positive for gait problem.  Skin:  Positive for wound.  Neurological:  Negative for dizziness.   Psychiatric/Behavioral: Negative.     Health Maintenance  Topic Date Due   Hepatitis C Screening  Never done   COLONOSCOPY (Pts 45-29yrs Insurance coverage will need to be confirmed)  Never done   MAMMOGRAM  Never done   Zoster Vaccines- Shingrix (1 of 2) Never done   DEXA SCAN  Never done   PNA vac Low Risk Adult (1 of 2 - PCV13) Never done   COVID-19 Vaccine (3 - Booster for Pfizer series) 05/27/2020   INFLUENZA VACCINE  04/22/2021   TETANUS/TDAP  07/11/2024   HPV VACCINES  Aged Out  Physical Exam: Vitals:   04/29/21 1607  BP: 123/82  Pulse: 76  Resp: 18  Temp: 97.9 F (36.6 C)  SpO2: 100%  Weight: 125 lb (56.7 kg)  Height: 5\' 7"  (1.702 m)   Body mass index is 19.58 kg/m. Physical Exam Constitutional: Oriented to person, place, and time. Well-developed and well-nourished.  HENT:  Head: Normocephalic.  Mouth/Throat: Oropharynx is clear and moist.  Eyes: Pupils are equal, round, and reactive to light.  Neck: Neck supple.  Cardiovascular: Normal rate and normal heart sounds.  No murmur heard. Pulmonary/Chest: Effort normal and breath sounds normal. No respiratory distress. No wheezes. She has no rales.  Abdominal: Soft. Bowel sounds are normal. No distension. There is no tenderness. There is no rebound.  Musculoskeletal: Mild Edema Bilateral Lymphadenopathy: none Neurological: Alert and oriented to person, place, and time.  Skin: Skin is warm and dry. Incision site in her Right Toe in the left corner is Red swollen and tender  Psychiatric: Normal mood and affect. Behavior is normal. Thought content normal.   Labs reviewed: Basic Metabolic Panel: Recent Labs    03/19/21 0000 04/13/21 1825  NA 142 137  K 4.1 4.1  CL 101 100  CO2 27* 26  GLUCOSE  --  155*  BUN 35* 31*  CREATININE 1.0 1.03*  CALCIUM 9.8 9.5   Liver Function Tests: Recent Labs    03/19/21 0000 04/13/21 1825  AST 29 19  ALT 24 9  ALKPHOS 99 79  BILITOT  --  0.4  PROT  --  7.3   ALBUMIN 4.4 3.8   No results for input(s): LIPASE, AMYLASE in the last 8760 hours. No results for input(s): AMMONIA in the last 8760 hours. CBC: Recent Labs    03/19/21 0000 04/13/21 1825  WBC 5.4 7.5  NEUTROABS  --  4.4  HGB 11.4* 10.7*  HCT 32* 32.0*  MCV  --  90.9  PLT 280 331   Lipid Panel: Recent Labs    03/19/21 0000  CHOL 179  HDL 83*  LDLCALC 86  TRIG 52   Lab Results  Component Value Date   HGBA1C 9.4 03/19/2021    Procedures since last visit: DG Foot Complete Right  Result Date: 04/13/2021 CLINICAL DATA:  Right great toe wound with. Early drainage. Diabetic patient. EXAM: RIGHT FOOT COMPLETE - 3+ VIEW COMPARISON:  None. FINDINGS: There is resorption of portions of the distal tuft of the right great toe consistent with osteomyelitis. No fracture.  No bone lesion.  Joints are normally aligned. Soft tissue swelling is noted of the distal great toe. Tiny bubbles of air are seen along the distal aspect of the great toe adjacent to the partly resorbed distal tuft. IMPRESSION: 1. Osteomyelitis of the distal tuft of the right great toe with surrounding soft tissue swelling and a few tiny bubbles of soft tissue air. Electronically Signed   By: 04/15/2021 M.D.   On: 04/13/2021 18:40    Assessment/Plan Superficial incisional infection of surgical site Doxycyline 100 mg BID 10 days Urgent Appointment with Dr 04/15/2021 Amputation of right great toe (HCC)Due to Osteomyelitis Incisions seemed healed will need sutures removeal Type 1 diabetes mellitus with other circulatory complication [facility]) Manages her own BS with her endocrinologist They have been Averaging 350 per patient She is compensating by taking extra insulin  Hyperlipidemia, unspecified hyperlipidemia type On Statin LDL 83 in 6/22 NSTEMI (non-ST elevated myocardial infarction) (HCC) In setting of her DKA Cath was negative for any stenosis Just  saw her Cardiologist On Dual Therpay till Jan 2023 per  cardiology She saw her today Ischemic cardiomyopathy On Lasix and Lisinopril Could not tolerate beta blockers due to low BP and dizziness  Other issues Age-related osteoporosis without current pathological fracture Will Need repeat DEXA in 2023.  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

## 2021-04-30 ENCOUNTER — Ambulatory Visit (INDEPENDENT_AMBULATORY_CARE_PROVIDER_SITE_OTHER): Payer: Medicare Other | Admitting: Physician Assistant

## 2021-04-30 ENCOUNTER — Encounter: Payer: Self-pay | Admitting: Physician Assistant

## 2021-04-30 DIAGNOSIS — M869 Osteomyelitis, unspecified: Secondary | ICD-10-CM

## 2021-04-30 NOTE — Progress Notes (Signed)
Office Visit Note   Patient: Caitlin Sharp           Date of Birth: 01-Jul-1951           MRN: [id number] Visit Date: 04/30/2021              Requested by: [name], MD [address] [place],  [place] [address] PCP: [name], MD  Chief Complaint  Patient presents with   Right Foot - Routine Post Op    Right GTA      HPI: This is a pleasant 70 year old woman who is 13 days status post right great toe amputation.  She was to be seen tomorrow but her assisted living physician had concerns for increased swelling and redness around the wound.  She otherwise feels well denies any fever chills they empirically started her on a course of doxycycline  Assessment & Plan: Visit Diagnoses: No diagnosis found.  Plan: We will follow-up in 1 week for suture removal  Follow-Up Instructions: No follow-ups on file.   Ortho Exam  Patient is alert, oriented, no adenopathy, well-dressed, normal affect, normal respiratory effort. Examination mild soft tissue swelling at the amputation stump surgical sutures in place on the lateral side of the wound still some healing taking place.  Mild erythema around the wound but no ascending cellulitis.  No purulent drainage or foul odor  Imaging: No results found. No images are attached to the encounter.  Labs: Lab Results  Component Value Date   HGBA1C 9.4 03/19/2021   REPTSTATUS 04/18/2021 FINAL 04/13/2021   REPTSTATUS 04/18/2021 FINAL 04/13/2021   CULT  04/13/2021    NO GROWTH 5 DAYS Performed at [facility], [address] [place], [place] [address]    CULT  04/13/2021    NO GROWTH 5 DAYS Performed at [facility], [address] [place], [place] [address]      Lab Results  Component Value Date   ALBUMIN 3.8 04/13/2021   ALBUMIN 4.4 03/19/2021    No results found for: MG No results found for: VD25OH  No results found for: PREALBUMIN CBC EXTENDED Latest Ref Rng & Units 04/13/2021 03/19/2021  WBC 4.0 -  10.5 K/uL 7.5 5.4  RBC 3.87 - 5.11 MIL/uL 3.52(L) 3.58(A)  HGB 12.0 - 15.0 g/dL 10.7(L) 11.4(A)  HCT 36.0 - 46.0 % 32.0(L) 32(A)  PLT 150 - 400 K/uL 331 280  NEUTROABS 1.7 - 7.7 K/uL 4.4 -  LYMPHSABS 0.7 - 4.0 K/uL 2.1 -     There is no height or weight on file to calculate BMI.  Orders:  No orders of the defined types were placed in this encounter.  No orders of the defined types were placed in this encounter.    Procedures: No procedures performed  Clinical Data: No additional findings.  ROS:  All other systems negative, except as noted in the HPI. Review of Systems  Objective: Vital Signs: There were no vitals taken for this visit.  Specialty Comments:  No specialty comments available.  PMFS History: Patient Active Problem List   Diagnosis Date Noted   Osteomyelitis of great toe of right foot [facility])    Past Medical History:  Diagnosis Date   Abnormal thyroid blood test    Per Records recevied from [facility], Dr.Pearson   Acute deep vein thrombosis of right peroneal vein (HCC) 10/05/2020   RLE DVT   Anxiety    Arthritis    RA   Coronary artery  disease    Diabetes type I [facility])    Per [facility] New Patient Packet   Diabetic retinopathy [facility])    Per [facility] New Patient Packet   DKA (diabetic ketoacidosis) (HCC) 2021   Dr.William Zackowski.  Insulin Pump was out of Insulin   Glaucoma    Per [facility] New Patient Packet   H/O heart bypass surgery 2012   Dr. Excell Seltzer, Per [facility] New Patient Packet   Heart murmur    when I was a teenager   History of kidney stones    passed   Hx of CABG    Per records received form [facility], Dr.Pearson    Left bundle branch block    Mild mitral regurgitation    Per records from [facility], Dr.Pearson    Neuropathy 1976   Per [facility] New Patient Packet   NSTEMI (non-ST elevated myocardial infarction) (HCC) 09/21/2020   NSTEMI/demand ischemic in setting of DKA, patent grafts by 09/24/20 LHC, continue medical therapy   Osteoporosis    T  Score -2.7 , Per [facility] New Patient Packet   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 due to underlying condition (HCC)    Per Records recevied from [facility], Dr.Pearson    Family History  Problem Relation Age of Onset   Congestive Heart Failure Mother        Per [facility] New Patient Packet   Arthritis Mother        Per [facility] New Patient Packet   Osteoporosis Mother        Per [facility] New Patient Packet   Heart attack Father        Per [facility] New Patient Packet   Diabetes Sister        Per [facility] New Patient Packet   Emphysema Brother        Per [facility] New Patient Packet   Heart disease Brother        Per [facility] New Patient Packet    Past Surgical History:  Procedure Laterality Date   AMPUTATION Right 04/17/2021   Procedure: RIGHT GREAT TOE AMPUTATION;  Surgeon: [name], MD;  Location: [facility] OR;  Service: Orthopedics;  Laterality: Right;   CESAREAN SECTION  1970   Dr. Aquilla Hacker, Per [facility] New Patient Packet   CESAREAN SECTION  1973   Dr. Aquilla Hacker, Per [facility] New Patient Packet   CORONARY ARTERY BYPASS GRAFT     Social History   Occupational History   Not on file  Tobacco Use   Smoking status: Never   Smokeless tobacco: Never  Vaping Use   Vaping Use: Never used  Substance and Sexual Activity   Alcohol use: Never   Drug use: Never   Sexual activity: Not on file

## 2021-05-01 ENCOUNTER — Telehealth: Payer: Self-pay | Admitting: Orthopedic Surgery

## 2021-05-01 NOTE — Telephone Encounter (Signed)
Called and sw Chantea to advise per [facility] WTB instructions are the same as last week. Will eval at next appt.

## 2021-05-01 NOTE — Telephone Encounter (Signed)
Chantea with [facility] called wanting to know if the pts weight bearing status had changed? She would like a CB with this info please.   Chantea CB# [phone]

## 2021-05-02 ENCOUNTER — Encounter: Payer: Medicare Other | Admitting: Orthopedic Surgery

## 2021-05-07 ENCOUNTER — Encounter: Payer: Self-pay | Admitting: Physician Assistant

## 2021-05-07 ENCOUNTER — Ambulatory Visit: Payer: Medicare Other | Admitting: Physician Assistant

## 2021-05-07 ENCOUNTER — Other Ambulatory Visit: Payer: Self-pay

## 2021-05-07 DIAGNOSIS — M869 Osteomyelitis, unspecified: Secondary | ICD-10-CM

## 2021-05-07 NOTE — Progress Notes (Signed)
Office Visit Note   Patient: Caitlin Sharp           Date of Birth: 22-Jun-1951           MRN: [id number] Visit Date: 05/07/2021              Requested by: [name], MD [address] [place],  [place] [address] PCP: [name], MD  Chief Complaint  Patient presents with   Right Foot - Routine Post Op      HPI: Patient presents today 3 weeks status post right great toe amputation she is at assisted living overall doing well without complaints  Assessment & Plan: Visit Diagnoses: No diagnosis found.  Plan: Surgical sutures were removed today she will follow-up in 2 weeks at which time we will provide her for a prescription for an orthotic.  Continue covering wound and daily cleansing  Follow-Up Instructions: No follow-ups on file.   Ortho Exam  Patient is alert, oriented, no adenopathy, well-dressed, normal affect, normal respiratory effort. Great toe sutures removed today overall well apposed wound edges no erythema no signs of cellulitis swelling well controlled no wound dehiscence  Imaging: No results found. No images are attached to the encounter.  Labs: Lab Results  Component Value Date   HGBA1C 9.4 03/19/2021   REPTSTATUS 04/18/2021 FINAL 04/13/2021   REPTSTATUS 04/18/2021 FINAL 04/13/2021   CULT  04/13/2021    NO GROWTH 5 DAYS Performed at [facility], [address] [place], [place] [address]    CULT  04/13/2021    NO GROWTH 5 DAYS Performed at [facility], [address] [place], [place] [address]      Lab Results  Component Value Date   ALBUMIN 3.8 04/13/2021   ALBUMIN 4.4 03/19/2021    No results found for: MG No results found for: VD25OH  No results found for: PREALBUMIN CBC EXTENDED Latest Ref Rng & Units 04/13/2021 03/19/2021  WBC 4.0 - 10.5 K/uL 7.5 5.4  RBC 3.87 - 5.11 MIL/uL 3.52(L) 3.58(A)  HGB 12.0 - 15.0 g/dL 10.7(L) 11.4(A)  HCT 36.0 - 46.0 % 32.0(L) 32(A)  PLT 150 - 400 K/uL 331 280  NEUTROABS 1.7 -  7.7 K/uL 4.4 -  LYMPHSABS 0.7 - 4.0 K/uL 2.1 -     There is no height or weight on file to calculate BMI.  Orders:  No orders of the defined types were placed in this encounter.  No orders of the defined types were placed in this encounter.    Procedures: No procedures performed  Clinical Data: No additional findings.  ROS:  All other systems negative, except as noted in the HPI. Review of Systems  Objective: Vital Signs: There were no vitals taken for this visit.  Specialty Comments:  No specialty comments available.  PMFS History: Patient Active Problem List   Diagnosis Date Noted   Osteomyelitis of great toe of right foot (HCC)    Past Medical History:  Diagnosis Date   Abnormal thyroid blood test    Per Records recevied from [facility], Dr.Pearson   Acute deep vein thrombosis of right peroneal vein (HCC) 10/05/2020   RLE DVT   Anxiety    Arthritis    RA   Coronary artery disease    Diabetes type I [facility])    Per [facility] New Patient Packet   Diabetic retinopathy (HCC)    Per [facility] New Patient Packet   DKA (diabetic ketoacidosis) (HCC) 2021  Dr.William Zackowski.  Insulin Pump was out of Insulin   Glaucoma    Per [facility] New Patient Packet   H/O heart bypass surgery 2012   Dr. Excell Seltzer, Per [facility] New Patient Packet   Heart murmur    when I was a teenager   History of kidney stones    passed   Hx of CABG    Per records received [facility], Dr.Pearson    Left bundle branch block    Mild mitral regurgitation    Per records from [facility], Dr.Pearson    Neuropathy 1976   Per [facility] New Patient Packet   NSTEMI (non-ST elevated myocardial infarction) (HCC) 09/21/2020   NSTEMI/demand ischemic in setting of DKA, patent grafts by 09/24/20 LHC, continue medical therapy   Osteoporosis    T Score -2.7 , Per [facility] New Patient Packet   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 due to underlying condition (HCC)    Per Records  recevied from [facility], Dr.Pearson    Family History  Problem Relation Age of Onset   Congestive Heart Failure Mother        Per [facility] New Patient Packet   Arthritis Mother        Per [facility] New Patient Packet   Osteoporosis Mother        Per [facility] New Patient Packet   Heart attack Father        Per [facility] New Patient Packet   Diabetes Sister        Per [facility] New Patient Packet   Emphysema Brother        Per [facility] New Patient Packet   Heart disease Brother        Per [facility] New Patient Packet    Past Surgical History:  Procedure Laterality Date   AMPUTATION Right 04/17/2021   Procedure: RIGHT GREAT TOE AMPUTATION;  Surgeon: [name], MD;  Location: [facility] OR;  Service: Orthopedics;  Laterality: Right;   CESAREAN SECTION  1970   Dr. Aquilla Hacker, Per [facility] New Patient Packet   CESAREAN SECTION  1973   Dr. Aquilla Hacker, Per [facility] New Patient Packet   CORONARY ARTERY BYPASS GRAFT     Social History   Occupational History   Not on file  Tobacco Use   Smoking status: Never   Smokeless tobacco: Never  Vaping Use   Vaping Use: Never used  Substance and Sexual Activity   Alcohol use: Never   Drug use: Never   Sexual activity: Not on file

## 2021-05-14 ENCOUNTER — Other Ambulatory Visit: Payer: Self-pay

## 2021-05-14 ENCOUNTER — Ambulatory Visit (INDEPENDENT_AMBULATORY_CARE_PROVIDER_SITE_OTHER): Payer: Medicare Other | Admitting: Orthopedic Surgery

## 2021-05-14 ENCOUNTER — Encounter: Payer: Self-pay | Admitting: Orthopedic Surgery

## 2021-05-14 ENCOUNTER — Encounter: Payer: Medicare Other | Admitting: Orthopedic Surgery

## 2021-05-14 DIAGNOSIS — Z89411 Acquired absence of right great toe: Secondary | ICD-10-CM

## 2021-05-14 DIAGNOSIS — S98111A Complete traumatic amputation of right great toe, initial encounter: Secondary | ICD-10-CM

## 2021-05-14 NOTE — Progress Notes (Signed)
Office Visit Note   Patient: Caitlin Sharp           Date of Birth: Jan 02, 1951           MRN: [id number] Visit Date: 05/14/2021              Requested by: [name], MD [address] [place],  [place] [address] PCP: [name], MD  Chief Complaint  Patient presents with   Right Foot - Routine Post Op    04/17/21 right great toe amputation       HPI: Patient is a 70 year old woman who presents in follow-up 4 weeks status post amputation of right great toe.  The sutures were harvested last week.  Patient is currently ambulating in regular shoewear.  Assessment & Plan: Visit Diagnoses:  1. Amputation of right great toe (HCC)     Plan: Recommended minimizing weightbearing continue antibiotics dry dressing change daily.  Discussed that if there is further dehiscence we would have to amputate the metatarsal head.  Follow-Up Instructions: Return in about 2 weeks (around 05/28/2021).   Ortho Exam  Patient is alert, oriented, no adenopathy, well-dressed, normal affect, normal respiratory effort. Examination patient has good pulses there is no cellulitis there is minimal swelling patient has approximately 3 mm of wound dehiscence there is no exposed bone or tendon no cellulitis no drainage.  Imaging: No results found. No images are attached to the encounter.  Labs: Lab Results  Component Value Date   HGBA1C 9.4 03/19/2021   REPTSTATUS 04/18/2021 FINAL 04/13/2021   REPTSTATUS 04/18/2021 FINAL 04/13/2021   CULT  04/13/2021    NO GROWTH 5 DAYS Performed at [facility], [address], [place], [place] [address]    CULT  04/13/2021    NO GROWTH 5 DAYS Performed at [facility], [address], [place], [place] [address]      Lab Results  Component Value Date   ALBUMIN 3.8 04/13/2021   ALBUMIN 4.4 03/19/2021    No results found for: MG No results found for: VD25OH  No results found for: PREALBUMIN CBC EXTENDED Latest Ref Rng & Units  04/13/2021 03/19/2021  WBC 4.0 - 10.5 K/uL 7.5 5.4  RBC 3.87 - 5.11 MIL/uL 3.52(L) 3.58(A)  HGB 12.0 - 15.0 g/dL 10.7(L) 11.4(A)  HCT 36.0 - 46.0 % 32.0(L) 32(A)  PLT 150 - 400 K/uL 331 280  NEUTROABS 1.7 - 7.7 K/uL 4.4 -  LYMPHSABS 0.7 - 4.0 K/uL 2.1 -     There is no height or weight on file to calculate BMI.  Orders:  No orders of the defined types were placed in this encounter.  No orders of the defined types were placed in this encounter.    Procedures: No procedures performed  Clinical Data: No additional findings.  ROS:  All other systems negative, except as noted in the HPI. Review of Systems  Objective: Vital Signs: There were no vitals taken for this visit.  Specialty Comments:  No specialty comments available.  PMFS History: Patient Active Problem List   Diagnosis Date Noted   Osteomyelitis of great toe of right foot [facility])    Past Medical History:  Diagnosis Date   Abnormal thyroid blood test    Per Records recevied [facility], Dr.Pearson   Acute deep vein thrombosis of right peroneal vein (HCC) 10/05/2020   RLE DVT   Anxiety    Arthritis    RA   Coronary artery disease  Diabetes type I [facility])    Per [facility] New Patient Packet   Diabetic retinopathy [facility])    Per [facility] New Patient Packet   DKA (diabetic ketoacidosis) (HCC) 2021   Dr.William Zackowski.  Insulin Pump was out of Insulin   Glaucoma    Per [facility] New Patient Packet   H/O heart bypass surgery 2012   Dr. Excell Seltzer, Per [facility] New Patient Packet   Heart murmur    when I was a teenager   History of kidney stones    passed   Hx of CABG    Per records received [facility], Dr.Pearson    Left bundle branch block    Mild mitral regurgitation    Per records from [facility], Dr.Pearson    Neuropathy 1976   Per [facility] New Patient Packet   NSTEMI (non-ST elevated myocardial infarction) (HCC) 09/21/2020   NSTEMI/demand ischemic in setting of DKA, patent grafts by 09/24/20 [facility], continue medical  therapy   Osteoporosis    T Score -2.7 , Per [facility] New Patient Packet   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 New Patient Packet   Emphysema Brother        Per [facility] New Patient Packet   Heart disease Brother        Per [facility] New Patient Packet    Past Surgical History:  Procedure Laterality Date   AMPUTATION Right 04/17/2021   Procedure: RIGHT GREAT TOE AMPUTATION;  Surgeon: [name], MD;  Location: [facility] OR;  Service: Orthopedics;  Laterality: Right;   CESAREAN SECTION  1970   Dr. Aquilla Hacker, Per [facility] New Patient Packet   CESAREAN SECTION  1973   Dr. Aquilla Hacker, Per [facility] New Patient Packet   CORONARY ARTERY BYPASS GRAFT     Social History   Occupational History   Not on file  Tobacco Use   Smoking status: Never   Smokeless tobacco: Never  Vaping Use   Vaping Use: Never used  Substance and Sexual Activity   Alcohol use: Never   Drug use: Never   Sexual activity: Not on file

## 2021-05-16 ENCOUNTER — Encounter: Payer: Medicare Other | Admitting: Orthopedic Surgery

## 2021-05-21 ENCOUNTER — Ambulatory Visit: Payer: Medicare Other | Admitting: Physician Assistant

## 2021-05-28 ENCOUNTER — Other Ambulatory Visit: Payer: Self-pay | Admitting: Orthopedic Surgery

## 2021-05-28 ENCOUNTER — Encounter: Payer: Self-pay | Admitting: Family

## 2021-05-28 ENCOUNTER — Ambulatory Visit (INDEPENDENT_AMBULATORY_CARE_PROVIDER_SITE_OTHER): Payer: Medicare Other | Admitting: Family

## 2021-05-28 DIAGNOSIS — M869 Osteomyelitis, unspecified: Secondary | ICD-10-CM

## 2021-05-28 NOTE — Progress Notes (Signed)
Post-Op Visit Note   Patient: Caitlin Sharp           Date of Birth: 1951-05-05           MRN: [id number] Visit Date: 05/28/2021 PCP: [name], MD  Chief Complaint: No chief complaint on file.   HPI:  HPI The patient is a 70 year old woman seen status post great toe amputation on the right.  This has dehisced some.  The patient is pleased with her progress.  She lives at [facility] and has been having nursing assistance with her dressing changes Ortho Exam On examination of the right foot the great toe surgically absent the incision has dehisced about 3 mm wide this is 3 mm deep filled in with 100% fibrinous exudative tissue this does not probe.  There is very mild surrounding erythema no cellulitis no odor  Visit Diagnoses: No diagnosis found.  Plan: Silver cell dressing applied.  They will continue daily Dial soap cleansing.  Packing the wound open with silver cell dry dressing.  Minimize weightbearing  Follow-Up Instructions: Return in about 2 weeks (around 06/11/2021).   Imaging: No results found.  Orders:  No orders of the defined types were placed in this encounter.  No orders of the defined types were placed in this encounter.    PMFS History: Patient Active Problem List   Diagnosis Date Noted   Osteomyelitis of great toe of right foot (HCC)    Past Medical History:  Diagnosis Date   Abnormal thyroid blood test    Per Records recevied from [facility], Dr.Pearson   Acute deep vein thrombosis of right peroneal vein (HCC) 10/05/2020   RLE DVT   Anxiety    Arthritis    RA   Coronary artery disease    Diabetes type I [facility])    Per [facility] New Patient Packet   Diabetic retinopathy (HCC)    Per [facility] New Patient Packet   DKA (diabetic ketoacidosis) (HCC) 2021   Dr.William Zackowski.  Insulin Pump was out of Insulin   Glaucoma    Per [facility] New Patient Packet   H/O heart bypass surgery 2012   Dr. Excell Seltzer, Per [facility] New Patient Packet   Heart murmur    when I was  a teenager   History of kidney stones    passed   Hx of CABG    Per records received [facility], Dr.Pearson    Left bundle branch block    Mild mitral regurgitation    Per records [facility], Dr.Pearson    Neuropathy 1976   Per [facility] New Patient Packet   NSTEMI (non-ST elevated myocardial infarction) (HCC) 09/21/2020   NSTEMI/demand ischemic in setting of DKA, patent grafts by 09/24/20 [facility], continue medical therapy   Osteoporosis    T Score -2.7 , Per [facility] New Patient Packet   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 due to underlying condition [facility])    Per Records recevied from [facility], Dr.Pearson    Family History  Problem Relation Age of Onset   Congestive Heart Failure Mother        Per [facility] New Patient Packet   Arthritis Mother        Per [facility] New Patient Packet   Osteoporosis Mother        Per [facility] New Patient Packet   Heart attack Father        Per [facility] New Patient Packet   Diabetes Sister  Per [facility] New Patient Packet   Emphysema Brother        Per [facility] New Patient Packet   Heart disease Brother        Per [facility] New Patient Packet    Past Surgical History:  Procedure Laterality Date   AMPUTATION Right 04/17/2021   Procedure: RIGHT GREAT TOE AMPUTATION;  Surgeon: [name], MD;  [facility];  Service: Orthopedics;  Laterality: Right;   CESAREAN SECTION  1970   Dr. Aquilla Hacker, Per [facility] New Patient Packet   CESAREAN SECTION  1973   Dr. Aquilla Hacker, Per [facility] New Patient Packet   CORONARY ARTERY BYPASS GRAFT     Social History   Occupational History   Not on file  Tobacco Use   Smoking status: Never   Smokeless tobacco: Never  Vaping Use   Vaping Use: Never used  Substance and Sexual Activity   Alcohol use: Never   Drug use: Never   Sexual activity: Not on file

## 2021-06-10 LAB — TSH: TSH: 2.9 (ref 0.41–5.90)

## 2021-07-12 ENCOUNTER — Telehealth: Payer: Self-pay

## 2021-07-12 NOTE — Telephone Encounter (Signed)
Called appt sch for monday

## 2021-07-12 NOTE — Telephone Encounter (Signed)
[name] from [employer] called. She said that patient had a right great toe amputation on 04/17/2021. This area still areas infected. She is going to send an email with picture that I will forward to [facility]'s clinic.  Please advise [name] on what she should do. Call back # [phone]

## 2021-07-12 NOTE — Telephone Encounter (Signed)
Caitlin Sharp called back stating when she tried to send the email it denied; she would like a CB please.   [phone]

## 2021-07-15 ENCOUNTER — Encounter: Payer: Self-pay | Admitting: Orthopedic Surgery

## 2021-07-15 ENCOUNTER — Ambulatory Visit (INDEPENDENT_AMBULATORY_CARE_PROVIDER_SITE_OTHER): Payer: Medicare Other | Admitting: Orthopedic Surgery

## 2021-07-15 DIAGNOSIS — M869 Osteomyelitis, unspecified: Secondary | ICD-10-CM

## 2021-07-15 NOTE — Progress Notes (Signed)
Office Visit Note   Patient: Caitlin Sharp           Date of Birth: November 15, 1950           MRN: [id number] Visit Date: 07/15/2021              Requested by: [name], MD [address] [place],  [place] [address] PCP: [name], MD  Chief Complaint  Patient presents with   Right Foot - Routine Post Op    04/17/21 right GT amputation       HPI: Patient is a 70 year old woman who is 3 months status post amputation right great toe at the MTP joint.  Patient states she was being fit for inserts for her shoes when the skin broke down and she developed an ulcer with drainage.  Assessment & Plan: Visit Diagnoses:  1. Osteomyelitis of great toe of right foot (HCC)     Plan: Discussed patient she has a dehiscence of the wound with exposed bone and cellulitis.  Recommended proceeding with a partial first ray amputation right great toe we will proceed with surgery as outpatient Account on Friday.  Patient states she would like to proceed with surgery as soon as possible.  Follow-Up Instructions: Return in about 1 week (around 07/22/2021).   Ortho Exam  Patient is alert, oriented, no adenopathy, well-dressed, normal affect, normal respiratory effort. Examination the Doppler was used and patient has a strong biphasic dorsalis pedis and posterior tibial pulse no macro abnormalities with her circulation.  Patient has cellulitis around the first metatarsal head with an ulcer that probes down to bone.  There is no purulent drainage at this time no ascending cellulitis.  Imaging: No results found. No images are attached to the encounter.  Labs: Lab Results  Component Value Date   HGBA1C 9.4 03/19/2021   REPTSTATUS 04/18/2021 FINAL 04/13/2021   REPTSTATUS 04/18/2021 FINAL 04/13/2021   CULT  04/13/2021    NO GROWTH 5 DAYS Performed at [facility], [address], [place], [place] [address]    CULT  04/13/2021    NO GROWTH 5 DAYS Performed at [facility],  [address] [place], [place] [address]      Lab Results  Component Value Date   ALBUMIN 3.8 04/13/2021   ALBUMIN 4.4 03/19/2021    No results found for: MG No results found for: VD25OH  No results found for: PREALBUMIN CBC EXTENDED Latest Ref Rng & Units 04/13/2021 03/19/2021  WBC 4.0 - 10.5 K/uL 7.5 5.4  RBC 3.87 - 5.11 MIL/uL 3.52(L) 3.58(A)  HGB 12.0 - 15.0 g/dL 10.7(L) 11.4(A)  HCT 36.0 - 46.0 % 32.0(L) 32(A)  PLT 150 - 400 K/uL 331 280  NEUTROABS 1.7 - 7.7 K/uL 4.4 -  LYMPHSABS 0.7 - 4.0 K/uL 2.1 -     There is no height or weight on file to calculate BMI.  Orders:  No orders of the defined types were placed in this encounter.  No orders of the defined types were placed in this encounter.    Procedures: No procedures performed  Clinical Data: No additional findings.  ROS:  All other systems negative, except as noted in the HPI. Review of Systems  Objective: Vital Signs: There were no vitals taken for this visit.  Specialty Comments:  No specialty comments available.  PMFS History: Patient Active Problem List   Diagnosis Date Noted   Osteomyelitis of great toe of right foot (HCC)  Past Medical History:  Diagnosis Date   Abnormal thyroid blood test    Per Records recevied from [facility], Dr.Pearson   Acute deep vein thrombosis of right peroneal vein (HCC) 10/05/2020   RLE DVT   Anxiety    Arthritis    RA   Coronary artery disease    Diabetes type I [facility])    Per [facility] New Patient Packet   Diabetic retinopathy (HCC)    Per [facility] New Patient Packet   DKA (diabetic ketoacidosis) (HCC) 2021   Dr.William Zackowski.  Insulin Pump was out of Insulin   Glaucoma    Per [facility] New Patient Packet   H/O heart bypass surgery 2012   Dr. Excell Seltzer, Per [facility] New Patient Packet   Heart murmur    when I was a teenager   History of kidney stones    passed   Hx of CABG    Per records received [facility], Dr.Pearson    Left bundle branch block    Mild mitral  regurgitation    Per records from [facility], Dr.Pearson    Neuropathy 1976   Per [facility] New Patient Packet   NSTEMI (non-ST elevated myocardial infarction) (HCC) 09/21/2020   NSTEMI/demand ischemic in setting of DKA, patent grafts by 09/24/20 [facility], continue medical therapy   Osteoporosis    T Score -2.7 , Per [facility] New Patient Packet   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 due to underlying condition (HCC)    Per Records recevied [facility], Dr.Pearson    Family History  Problem Relation Age of Onset   Congestive Heart Failure Mother        Per [facility] New Patient Packet   Arthritis Mother        Per [facility] New Patient Packet   Osteoporosis Mother        Per [facility] New Patient Packet   Heart attack Father        Per [facility] New Patient Packet   Diabetes Sister        Per [facility] New Patient Packet   Emphysema Brother        Per [facility] New Patient Packet   Heart disease Brother        Per [facility] New Patient Packet    Past Surgical History:  Procedure Laterality Date   AMPUTATION Right 04/17/2021   Procedure: RIGHT GREAT TOE AMPUTATION;  Surgeon: [name], MD;  Location[facility] OR;  Service: Orthopedics;  Laterality: Right;   CESAREAN SECTION  1970   Dr. Aquilla Hacker, Per [facility] New Patient Packet   CESAREAN SECTION  1973   Dr. Aquilla Hacker, Per [facility] New Patient Packet   CORONARY ARTERY BYPASS GRAFT     Social History   Occupational History   Not on file  Tobacco Use   Smoking status: Never   Smokeless tobacco: Never  Vaping Use   Vaping Use: Never used  Substance and Sexual Activity   Alcohol use: Never   Drug use: Never   Sexual activity: Not on file

## 2021-07-18 ENCOUNTER — Other Ambulatory Visit: Payer: Self-pay

## 2021-07-18 ENCOUNTER — Encounter (HOSPITAL_COMMUNITY): Payer: Self-pay | Admitting: Orthopedic Surgery

## 2021-07-18 NOTE — Pre-Procedure Instructions (Signed)
    Caitlin Sharp  07/18/2021     Surgery is scheduled on Friday, October 28.  Report to [facility], Main Entrance or Entrance "A" at [facility] [employer] [employer] this number if you have problems the morning of surgery: [phone]  This is the number for the Pre- Surgical Desk.                For any other questions, please call [phone], Monday - Friday 8 AM - 4 PM.   >>>>>Please send patient's Medication Record with medications administrated documentation. ( this information is required prior to OR. This includes medications that may have been on hold for surgery)<<<<<    Remember:  Do not eat  after midnight, October 27.  You may drink clear liquids until 0900 .  Clear liquids allowed are:   Water, Juice (non-citric and without pulp - diabetics please choose diet or no sugar options), Carbonated beverages - (diabetics please choose diet or no sugar options), Clear Tea, Black Coffee only (no creamer, milk or cream including half and half), Plain Jell-O only (diabetics please choose diet or no sugar options), Gatorade (diabetics please choose diet or no sugar options), and Plain Popsicles only    Thursday, 10//22 bedtime - give 11 units of Lantus Insulin  Take these medicines the morning of surgery with A SIP OF WATER: Take if needed: NTG HYDROcodone-acetaminophen (NORCO/VICODIN)   Follow the surgeon's instructions regarding Aspirin and Plavix   STOP taking Aspirin Products (Goody Powder, Excedrin Migraine), Ibuprofen (Advil), Naproxen (Aleve), Vitamins and Herbal Products (ie Fish Oil).   WHAT DO I DO ABOUT MY DIABETES MEDICATION? Thursday at bedtime, patient should take 11 units of Lantus Insulin. (80%)  Friday AM if CBG is > 220, patient should take 1/2 of sliding scale insulin.  Check blood sugar the morning of your surgery when you wake up and every 2 hours until you get to the Short Stay unit. If blood sugar is less than 70 mg/dL, you will need to treat for  low blood sugar: Do not take insulin. Treat a low blood sugar (less than 70 mg/dL) with  cup of clear juice (cranberry or apple), 4 glucose tablets, OR glucose gel. Recheck blood sugar in 15 minutes after treatment (to make sure it is greater than 70 mg/dL). If your blood sugar is not greater than 70 mg/dL on recheck, call [phone]  for further instructions. Report your blood sugar to the short stay nurse when you get to Short Stay.  [name] should have a shower, with antibacteria soap, the morning of surgery . Dry off with a clean towel.  Patient should not have lotions, powders, colognes, deodorant, jewelry, or piercing's. Wear clean comfortable clothes.  Brush teeth. Contacts, dentures or bridgework may not be worn into surgery, if patient wears glasses, send a glasses case with the patient.  Patient should not shave within 48 hours prior to surgery. Do not bring valuables to the hospital.  [facility] is not responsible for any belongings or valuables.

## 2021-07-18 NOTE — Progress Notes (Addendum)
I spoke with to [facility] , Mrs Detlefsen and to patient also. Patient denies chest pain or shortness of breath. Collier Bullock reports that Mrs. Tse does not haves/s of COvid and no one in the facility has any s/s of Covid.  Mrs. Paschen has type I diabetes, blood sugars ar running above 200 - 400 ( I saw in Dr. Notes), patient would not give me the top numbers she has been . Last A1C was 9.2 on 05/23/21. The instructions I gave facility and patient recieved a copy  have patient taking 11 units of Lantus at hs, if CBG is greater than 220 in am, patient should take 1/2 of s/s Insulin.  Mrs Westergren has a Libre glucose reader on left arm.

## 2021-07-19 ENCOUNTER — Encounter (HOSPITAL_COMMUNITY)
Admission: RE | Disposition: A | Discharge status: Home / Self Care | Payer: Self-pay | Source: Ambulatory Visit | Attending: Orthopedic Surgery

## 2021-07-19 ENCOUNTER — Ambulatory Visit (HOSPITAL_COMMUNITY): Payer: Medicare Other | Admitting: Anesthesiology

## 2021-07-19 ENCOUNTER — Ambulatory Visit (HOSPITAL_COMMUNITY)
Admission: RE | Admit: 2021-07-19 | Discharge: 2021-07-19 | Disposition: A | Discharge status: Home / Self Care | Payer: Medicare Other | Source: Ambulatory Visit | Attending: Orthopedic Surgery | Admitting: Orthopedic Surgery

## 2021-07-19 ENCOUNTER — Encounter (HOSPITAL_COMMUNITY): Payer: Self-pay | Admitting: Orthopedic Surgery

## 2021-07-19 DIAGNOSIS — Z7902 Long term (current) use of antithrombotics/antiplatelets: Secondary | ICD-10-CM | POA: Diagnosis not present

## 2021-07-19 DIAGNOSIS — Z951 Presence of aortocoronary bypass graft: Secondary | ICD-10-CM | POA: Insufficient documentation

## 2021-07-19 DIAGNOSIS — Z880 Allergy status to penicillin: Secondary | ICD-10-CM | POA: Insufficient documentation

## 2021-07-19 DIAGNOSIS — L97519 Non-pressure chronic ulcer of other part of right foot with unspecified severity: Secondary | ICD-10-CM | POA: Insufficient documentation

## 2021-07-19 DIAGNOSIS — Z794 Long term (current) use of insulin: Secondary | ICD-10-CM | POA: Diagnosis not present

## 2021-07-19 DIAGNOSIS — E1069 Type 1 diabetes mellitus with other specified complication: Secondary | ICD-10-CM | POA: Insufficient documentation

## 2021-07-19 DIAGNOSIS — M86671 Other chronic osteomyelitis, right ankle and foot: Secondary | ICD-10-CM

## 2021-07-19 DIAGNOSIS — E10621 Type 1 diabetes mellitus with foot ulcer: Secondary | ICD-10-CM | POA: Diagnosis not present

## 2021-07-19 DIAGNOSIS — Z7982 Long term (current) use of aspirin: Secondary | ICD-10-CM | POA: Insufficient documentation

## 2021-07-19 DIAGNOSIS — M869 Osteomyelitis, unspecified: Secondary | ICD-10-CM | POA: Diagnosis not present

## 2021-07-19 DIAGNOSIS — Z79899 Other long term (current) drug therapy: Secondary | ICD-10-CM | POA: Diagnosis not present

## 2021-07-19 HISTORY — DX: Personal history of other medical treatment: Z92.89

## 2021-07-19 HISTORY — PX: AMPUTATION: SHX166

## 2021-07-19 LAB — CBC
HCT: 35 % — ABNORMAL LOW (ref 36.0–46.0)
Hemoglobin: 11.5 g/dL — ABNORMAL LOW (ref 12.0–15.0)
MCH: 30 pg (ref 26.0–34.0)
MCHC: 32.9 g/dL (ref 30.0–36.0)
MCV: 91.4 fL (ref 80.0–100.0)
Platelets: 276 10*3/uL (ref 150–400)
RBC: 3.83 MIL/uL — ABNORMAL LOW (ref 3.87–5.11)
RDW: 13 % (ref 11.5–15.5)
WBC: 8 10*3/uL (ref 4.0–10.5)
nRBC: 0 % (ref 0.0–0.2)

## 2021-07-19 LAB — BASIC METABOLIC PANEL
Anion gap: 9 (ref 5–15)
BUN: 28 mg/dL — ABNORMAL HIGH (ref 8–23)
CO2: 27 mmol/L (ref 22–32)
Calcium: 9.1 mg/dL (ref 8.9–10.3)
Chloride: 99 mmol/L (ref 98–111)
Creatinine, Ser: 1.01 mg/dL — ABNORMAL HIGH (ref 0.44–1.00)
GFR, Estimated: 60 mL/min — ABNORMAL LOW (ref >60)
Glucose, Bld: 235 mg/dL — ABNORMAL HIGH (ref 70–99)
Potassium: 4.2 mmol/L (ref 3.5–5.1)
Sodium: 135 mmol/L (ref 135–145)

## 2021-07-19 LAB — GLUCOSE, CAPILLARY
Glucose-Capillary: 233 mg/dL — ABNORMAL HIGH (ref 70–99)
Glucose-Capillary: 224 mg/dL — ABNORMAL HIGH (ref 70–99)
Glucose-Capillary: 188 mg/dL — ABNORMAL HIGH (ref 70–99)

## 2021-07-19 SURGERY — AMPUTATION, FOOT, RAY
Anesthesia: Monitor Anesthesia Care | Laterality: Right

## 2021-07-19 MED ORDER — HYDROCODONE-ACETAMINOPHEN 5-325 MG PO TABS: 1.0000 | ORAL_TABLET | ORAL | 0 refills | Status: DC | PRN

## 2021-07-19 MED ORDER — PHENYLEPHRINE 40 MCG/ML (10ML) SYRINGE FOR IV PUSH (FOR BLOOD PRESSURE SUPPORT)
PREFILLED_SYRINGE | INTRAVENOUS | Status: DC | PRN
  Administered 2021-07-19: 80 ug via INTRAVENOUS

## 2021-07-19 MED ORDER — ONDANSETRON HCL 4 MG/2ML IJ SOLN
INTRAMUSCULAR | Status: AC
  Filled 2021-07-19: qty 2

## 2021-07-19 MED ORDER — 0.9 % SODIUM CHLORIDE (POUR BTL) OPTIME
TOPICAL | Status: DC | PRN
  Administered 2021-07-19: 1000 mL

## 2021-07-19 MED ORDER — SODIUM CHLORIDE 0.45 % IV SOLN: INTRAVENOUS | Status: DC

## 2021-07-19 MED ORDER — FENTANYL CITRATE (PF) 100 MCG/2ML IJ SOLN: 100.0000 ug | Freq: Once | INTRAMUSCULAR | Status: AC

## 2021-07-19 MED ORDER — ONDANSETRON HCL 4 MG/2ML IJ SOLN
INTRAMUSCULAR | Status: DC | PRN
  Administered 2021-07-19: 4 mg via INTRAVENOUS

## 2021-07-19 MED ORDER — PROPOFOL 10 MG/ML IV BOLUS
INTRAVENOUS | Status: DC | PRN
  Administered 2021-07-19: 30 mg via INTRAVENOUS
  Administered 2021-07-19: 100 ug/kg/min via INTRAVENOUS

## 2021-07-19 MED ORDER — TRANEXAMIC ACID-NACL 1000-0.7 MG/100ML-% IV SOLN
1000.0000 mg | INTRAVENOUS | Status: AC
  Administered 2021-07-19: 1000 mg via INTRAVENOUS
  Filled 2021-07-19: qty 100

## 2021-07-19 MED ORDER — FENTANYL CITRATE (PF) 100 MCG/2ML IJ SOLN: 25.0000 ug | INTRAMUSCULAR | Status: DC | PRN

## 2021-07-19 MED ORDER — LACTATED RINGERS IV SOLN: INTRAVENOUS | Status: DC

## 2021-07-19 MED ORDER — CLINDAMYCIN PHOSPHATE 900 MG/50ML IV SOLN
900.0000 mg | INTRAVENOUS | Status: AC
  Administered 2021-07-19: 900 mg via INTRAVENOUS
  Filled 2021-07-19: qty 50

## 2021-07-19 MED ORDER — MIDAZOLAM HCL 2 MG/2ML IJ SOLN
INTRAMUSCULAR | Status: AC
  Filled 2021-07-19: qty 2

## 2021-07-19 MED ORDER — FENTANYL CITRATE (PF) 100 MCG/2ML IJ SOLN
INTRAMUSCULAR | Status: AC
  Administered 2021-07-19: 100 ug via INTRAVENOUS
  Filled 2021-07-19: qty 2

## 2021-07-19 MED ORDER — ORAL CARE MOUTH RINSE: 15.0000 mL | Freq: Once | OROMUCOSAL | Status: AC

## 2021-07-19 MED ORDER — ACETAMINOPHEN 500 MG PO TABS
1000.0000 mg | ORAL_TABLET | Freq: Once | ORAL | Status: AC
  Administered 2021-07-19: 1000 mg via ORAL
  Filled 2021-07-19: qty 2

## 2021-07-19 MED ORDER — CHLORHEXIDINE GLUCONATE 0.12 % MT SOLN
15.0000 mL | Freq: Once | OROMUCOSAL | Status: AC
  Administered 2021-07-19: 15 mL via OROMUCOSAL
  Filled 2021-07-19: qty 15

## 2021-07-19 SURGICAL SUPPLY — 35 items
BAG COUNTER SPONGE SURGICOUNT (BAG) IMPLANT
BAG SPNG CNTER NS LX DISP (BAG)
BAG SURGICOUNT SPONGE COUNTING (BAG)
BLADE SAW SGTL MED 73X18.5 STR (BLADE) IMPLANT
BLADE SURG 21 STRL SS (BLADE) ×3 IMPLANT
BNDG COHESIVE 4X5 TAN STRL (GAUZE/BANDAGES/DRESSINGS) ×3 IMPLANT
BNDG COHESIVE 6X5 TAN NS LF (GAUZE/BANDAGES/DRESSINGS) ×3 IMPLANT
BNDG GAUZE ELAST 4 BULKY (GAUZE/BANDAGES/DRESSINGS) ×3 IMPLANT
COVER SURGICAL LIGHT HANDLE (MISCELLANEOUS) ×6 IMPLANT
DRAPE DERMATAC (DRAPES) ×3 IMPLANT
DRAPE U-SHAPE 47X51 STRL (DRAPES) ×3 IMPLANT
DRESSING PEEL AND PLC PRVNA 13 (GAUZE/BANDAGES/DRESSINGS) ×1 IMPLANT
DRSG ADAPTIC 3X8 NADH LF (GAUZE/BANDAGES/DRESSINGS) ×3 IMPLANT
DRSG PAD ABDOMINAL 8X10 ST (GAUZE/BANDAGES/DRESSINGS) ×3 IMPLANT
DRSG PEEL AND PLACE PREVENA 13 (GAUZE/BANDAGES/DRESSINGS) ×3
DURAPREP 26ML APPLICATOR (WOUND CARE) ×3 IMPLANT
ELECT REM PT RETURN 9FT ADLT (ELECTROSURGICAL) ×3
ELECTRODE REM PT RTRN 9FT ADLT (ELECTROSURGICAL) ×1 IMPLANT
GAUZE SPONGE 4X4 12PLY STRL (GAUZE/BANDAGES/DRESSINGS) ×3 IMPLANT
GLOVE SURG ORTHO LTX SZ9 (GLOVE) ×3 IMPLANT
GLOVE SURG UNDER POLY LF SZ9 (GLOVE) ×3 IMPLANT
GOWN STRL REUS W/ TWL XL LVL3 (GOWN DISPOSABLE) ×2 IMPLANT
GOWN STRL REUS W/TWL XL LVL3 (GOWN DISPOSABLE) ×6
KIT BASIN OR (CUSTOM PROCEDURE TRAY) ×3 IMPLANT
KIT DRSG PREVENA PLUS 7DAY 125 (MISCELLANEOUS) ×3 IMPLANT
KIT TURNOVER KIT B (KITS) ×3 IMPLANT
NS IRRIG 1000ML POUR BTL (IV SOLUTION) ×3 IMPLANT
PACK ORTHO EXTREMITY (CUSTOM PROCEDURE TRAY) ×3 IMPLANT
PAD ARMBOARD 7.5X6 YLW CONV (MISCELLANEOUS) ×6 IMPLANT
STOCKINETTE IMPERVIOUS LG (DRAPES) IMPLANT
SUT ETHILON 2 0 PSLX (SUTURE) ×6 IMPLANT
TOWEL GREEN STERILE (TOWEL DISPOSABLE) ×3 IMPLANT
TUBE CONNECTING 12'X1/4 (SUCTIONS) ×1
TUBE CONNECTING 12X1/4 (SUCTIONS) ×2 IMPLANT
YANKAUER SUCT BULB TIP NO VENT (SUCTIONS) ×3 IMPLANT

## 2021-07-19 NOTE — Op Note (Addendum)
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 Application of Prevena wound VAC 13 cm.  SURGEON:  [name], MD  PHYSICIAN ASSISTANT:None ANESTHESIA:   General  PREOPERATIVE INDICATIONS:  [name] is a  70 y.o. 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    The risks benefits and alternatives were discussed with the patient preoperatively including but not limited to the risks of infection, bleeding, nerve injury, cardiopulmonary complications, the need for revision surgery, among others, and the patient was willing to proceed.  OPERATIVE IMPLANTS: Praveena 13 cm wound VAC  @ENCIMAGES @  OPERATIVE FINDINGS: Good petechial bleeding wound margins clear  OPERATIVE PROCEDURE: Patient was brought the operating room and underwent a regional anesthetic.  After adequate levels anesthesia obtained patient's right lower extremity was prepped using DuraPrep draped into a sterile field a timeout was called.  A racquet incision was made around the ulcerative tissue in the first ray.  This left a wound that was 11 x 4 cm.  Electrocardio was used hemostasis the first ray was resected through the base of the first metatarsal.  Wound was irrigated with normal saline.  Local tissue rearrangement was used to close the wound 11 x 4 cm.  A Prevena wound VAC was applied this had a good suction fit this was overwrapped with Coban patient was taken the PACU in stable condition   DISCHARGE PLANNING:  Antibiotic duration: Preoperative antibiotics  Weightbearing: Touchdown weightbearing on the right  Pain medication: Prescription for Vicodin  Dressing care/ Wound VAC: Wound VAC for 1 week  Ambulatory  devices: Walker  Discharge to: Home.  Follow-up: In the office 1 week post operative.

## 2021-07-19 NOTE — Progress Notes (Signed)
Pt took 2 units of personal humalog pen per Dr. Ernestina Penna for blood sugar 233.

## 2021-07-19 NOTE — Transfer of Care (Signed)
Immediate Anesthesia Transfer of Care Note  Patient: Caitlin Sharp  Procedure(s) Performed: RIGHT FOOT 1ST RAY AMPUTATION (Right)  Patient Location: PACU  Anesthesia Type:MAC and Regional  Level of Consciousness: awake, alert  and oriented  Airway & Oxygen Therapy: Patient Spontanous Breathing  Post-op Assessment: Report given to RN and Post -op Vital signs reviewed and stable  Post vital signs: Reviewed and stable  Last Vitals:  Vitals Value Taken Time  BP 118/59 07/19/21 1245  Temp    Pulse 66 07/19/21 1246  Resp 21 07/19/21 1246  SpO2 100 % 07/19/21 1246  Vitals shown include unvalidated device data.  Last Pain:  Vitals:   07/19/21 1048  TempSrc:   PainSc: 0-No pain      Patients Stated Pain Goal: 3 (20/35/59 7416)  Complications: No notable events documented.

## 2021-07-19 NOTE — H&P (Signed)
Caitlin Sharp is an 70 y.o. female.   Chief Complaint: Gangrene osteomyelitis right great toe HPI: Patient is a 70 year old woman who has had osteomyelitis ulceration right great toe she has failed conservative therapy and presents at this time for amputation of the right great toe.  Past Medical History:  Diagnosis Date   Abnormal thyroid blood test    Per Records recevied from [facility], Dr.Pearson   Acute deep vein thrombosis of right peroneal vein (HCC) 10/05/2020   RLE DVT   Anxiety    Arthritis    RA   Coronary artery disease    Diabetes type I [facility])    Per [facility] New Patient Packet   Diabetic retinopathy (HCC)    Per [facility] New Patient Packet   DKA (diabetic ketoacidosis) (HCC) 2021   Dr.William Zackowski.  Insulin Pump was out of Insulin   Glaucoma    Per [facility] New Patient Packet   H/O heart bypass surgery 2012   Dr. Excell Seltzer, Per [facility] New Patient Packet   Heart murmur    when I was a teenager   History of blood transfusion    History of kidney stones    passed   Hx of CABG    Per records received form [facility], Dr.Pearson    Left bundle branch block    Mild mitral regurgitation    Per records from [facility], Dr.Pearson    Neuropathy 1976   Per [facility] New Patient Packet   NSTEMI (non-ST elevated myocardial infarction) (HCC) 09/21/2020   NSTEMI/demand ischemic in setting of DKA, patent grafts by 09/24/20 LHC, continue medical therapy   Osteoporosis    T Score -2.7 , Per [facility] New Patient Packet   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 Aquilla Hacker, Per [facility] New Patient Packet   CESAREAN SECTION  1973    Dr. Aquilla Hacker, Per [facility] New Patient Packet   CORONARY ARTERY BYPASS GRAFT      Family History  Problem Relation Age of Onset   Congestive Heart Failure Mother        Per [facility] New Patient Packet   Arthritis Mother        Per [facility] New Patient Packet   Osteoporosis Mother        Per [facility] New Patient Packet   Heart attack Father        Per [facility] New Patient Packet   Diabetes Sister        Per [facility] New Patient Packet   Emphysema Brother        Per [facility] New Patient Packet   Heart disease Brother        Per [facility] New Patient Packet   Social History:  reports that she has never smoked. She has never used smokeless tobacco. She reports that she does not drink alcohol and does not use drugs.  Allergies:  Allergies  Allergen Reactions   Penicillins Anaphylaxis    Medications Prior to Admission  Medication Sig Dispense Refill   aspirin EC 81 MG tablet Take 81 mg by mouth in the morning. Swallow whole.  atorvastatin (LIPITOR) 40 MG tablet Take 40 mg by mouth at bedtime.     Biotin w/ Vitamins C & E (HAIR/SKIN/NAILS PO) Take 1 tablet by mouth in the morning.     brimonidine (ALPHAGAN) 0.2 % ophthalmic solution Place 1 drop into the left eye 3 (three) times daily.     Calcium Carbonate (CALCIUM-CARB 600 PO) Take 600 mg by mouth in the morning.     clopidogrel (PLAVIX) 75 MG tablet Take 75 mg by mouth in the morning.     furosemide (LASIX) 20 MG tablet Take 20 mg by mouth in the morning.     glucosamine-chondroitin 500-400 MG tablet Take 1 tablet by mouth every morning.     insulin glargine (LANTUS SOLOSTAR) 100 UNIT/ML Solostar Pen Inject 14 Units into the skin at bedtime. PATIENT SELF ADMINISTERS     insulin lispro (HUMALOG) 100 UNIT/ML KwikPen Inject 0-10 Units into the skin 3 (three) times daily. PATIENT SELF ADMINISTERS     latanoprost (XALATAN) 0.005 % ophthalmic solution Place 1 drop into both eyes every evening.     lisinopril (ZESTRIL) 2.5 MG tablet Take 2.5 mg by mouth in the morning.      Multiple Vitamins-Minerals (I-VITE PO) Take 1 tablet by mouth in the morning. True Vision Eye Health Capsule     nitroGLYCERIN (NITROSTAT) 0.4 MG SL tablet Place 0.4 mg under the tongue every 5 (five) minutes x 3 doses as needed for chest pain.     Omega-3 Fatty Acids (FISH OIL) 1000 MG CAPS Take 1,000 mg by mouth in the morning.     sennosides-docusate sodium (SENOKOT-S) 8.6-50 MG tablet Take 1 tablet by mouth every other day. IN THE MORNING     timolol (TIMOPTIC) 0.5 % ophthalmic solution Place 1 drop into the left eye in the morning and at bedtime.     Accu-Chek Softclix Lancets lancets USE 1 TO CHECK GLUCOSE THREE TIMES DAILY     glucose blood test strip 1 each by Other route as needed for other. Accu-chek     HYDROcodone-acetaminophen (NORCO/VICODIN) 5-325 MG tablet Take 1 tablet by mouth every 4 (four) hours as needed for moderate pain. 20 tablet 0   Insulin Pen Needle (PEN NEEDLES 3/16") 31G X 5 MM MISC 1 Device by Does not apply route 5 (five) times daily. With insulin administration      Results for orders placed or performed during the hospital encounter of 07/19/21 (from the past 48 hour(s))  Glucose, capillary     Status: Abnormal   Collection Time: 07/19/21  9:51 AM  Result Value Ref Range   Glucose-Capillary 233 (H) 70 - 99 mg/dL    Comment: Glucose reference range applies only to samples taken after fasting for at least 8 hours.   Comment 1 Notify RN   CBC per protocol     Status: Abnormal   Collection Time: 07/19/21 11:02 AM  Result Value Ref Range   WBC 8.0 4.0 - 10.5 K/uL   RBC 3.83 (L) 3.87 - 5.11 MIL/uL   Hemoglobin 11.5 (L) 12.0 - 15.0 g/dL   HCT 80.9 (L) 98.3 - 38.2 %   MCV 91.4 80.0 - 100.0 fL   MCH 30.0 26.0 - 34.0 pg   MCHC 32.9 30.0 - 36.0 g/dL   RDW 50.5 39.7 - 67.3 %   Platelets 276 150 - 400 K/uL   nRBC 0.0 0.0 - 0.2 %    Comment: Performed at [facility], [address] [place],  [place] [address]  Glucose, capillary     Status: Abnormal    Collection Time: 07/19/21 11:43 AM  Result Value Ref Range   Glucose-Capillary 224 (H) 70 - 99 mg/dL    Comment: Glucose reference range applies only to samples taken after fasting for at least 8 hours.   No results found.  Review of Systems  Blood pressure (!) 182/67, pulse 70, temperature 98.4 F (36.9 C), temperature source Oral, resp. rate 15, height 5\' 7"  (1.702 m), weight 58.1 kg, SpO2 100 %. Physical Exam  On examination patient is alert and oriented no adenopathy well-dressed normal affect normal respiratory effort she has palpable pulses.  She has a necrotic ulcer over the great toe which extends down to bone radiographs show destructive bony changes. Assessment/Plan Assessment: Osteomyelitis abscess ulceration right great toe.  Plan: We will plan for amputation of the first ray.  Risk and benefits were discussed including risk of the incision not healing need for additional surgery.  Patient states she understands wished to proceed at this time.  , MD 07/19/2021, 11:51 AM

## 2021-07-19 NOTE — Anesthesia Preprocedure Evaluation (Addendum)
Anesthesia Evaluation  Patient identified by MRN, date of birth, ID band Patient awake    Reviewed: Allergy & Precautions, NPO status , Patient's Chart, lab work & pertinent test results  Airway Mallampati: III  TM Distance: >3 FB Neck ROM: Full    Dental no notable dental hx. (+) Teeth Intact, Dental Advisory Given   Pulmonary neg pulmonary ROS,    Pulmonary exam normal breath sounds clear to auscultation       Cardiovascular hypertension, Pt. on medications + CAD, + Past MI, + CABG and + DVT  Normal cardiovascular exam+ Valvular Problems/Murmurs MR  Rhythm:Regular Rate:Normal  Cath 2022 Native chronic 3 vessel CAD  Patent sequential SVG to circ branches; Patent SVG to PDA; Patent LIMA  to LAD  Mild LV dysfunction  Note LAD occlusion probed with guidewire until further in lab eview of  images indicated no jeopardized branches between antegrade filling and  LIMA filling.   TTE 2022 Left ventricular systolic function is mild to moderately reduced.  LV ejection fraction = 40%.  There is hypokinesis in the LAD distribution.  The right ventricular systolic function is normal.  The left atrium is moderately dilated.  The right atrium is mildly dilated.  There is mild to moderate tricuspid regurgitation.  IVC size was moderately dilated.  Compared to the last study dated 11/23/2019, LV function has declined  and there are regional wall motion abnormalities consistent with  ischemia.     Neuro/Psych PSYCHIATRIC DISORDERS Anxiety negative neurological ROS     GI/Hepatic negative GI ROS, Neg liver ROS,   Endo/Other  negative endocrine ROSdiabetes, Insulin Dependent  Renal/GU negative Renal ROS  negative genitourinary   Musculoskeletal  (+) Arthritis ,   Abdominal   Peds  Hematology  (+) Blood dyscrasia (on plavix), ,   Anesthesia Other Findings   Reproductive/Obstetrics                            Anesthesia Physical Anesthesia Plan  ASA: 3  Anesthesia Plan: Regional and MAC   Post-op Pain Management:    Induction: Intravenous  PONV Risk Score and Plan: 2 and Ondansetron, Dexamethasone, Midazolam and Propofol infusion  Airway Management Planned: Natural Airway  Additional Equipment:   Intra-op Plan:   Post-operative Plan:   Informed Consent: I have reviewed the patients History and Physical, chart, labs and discussed the procedure including the risks, benefits and alternatives for the proposed anesthesia with the patient or authorized representative who has indicated his/her understanding and acceptance.     Dental advisory given  Plan Discussed with: CRNA  Anesthesia Plan Comments:        Anesthesia Quick Evaluation

## 2021-07-19 NOTE — Anesthesia Procedure Notes (Signed)
Procedure Name: MAC Date/Time: 07/19/2021 12:12 PM Performed by: Katherine Basset, CRNA Pre-anesthesia Checklist: Patient identified, Emergency Drugs available, Suction available, Patient being monitored and Timeout performed Patient Re-evaluated:Patient Re-evaluated prior to induction

## 2021-07-20 MED ORDER — ROPIVACAINE HCL 5 MG/ML IJ SOLN
INTRAMUSCULAR | Status: DC | PRN
  Administered 2021-07-19: 30 mL via PERINEURAL

## 2021-07-20 MED ORDER — DEXAMETHASONE SODIUM PHOSPHATE 10 MG/ML IJ SOLN
INTRAMUSCULAR | Status: DC | PRN
  Administered 2021-07-19: 5 mg

## 2021-07-20 NOTE — Anesthesia Procedure Notes (Signed)
Anesthesia Regional Block: Popliteal block   Pre-Anesthetic Checklist: , timeout performed,  Correct Patient, Correct Site, Correct Laterality,  Correct Procedure, Correct Position, site marked,  Risks and benefits discussed,  Surgical consent,  Pre-op evaluation,  At surgeon's request and post-op pain management  Laterality: Right  Prep: Maximum Sterile Barrier Precautions used, chloraprep       Needles:  Injection technique: Single-shot  Needle Type: Echogenic Stimulator Needle     Needle Length: 9cm  Needle Gauge: 22     Additional Needles:   Procedures:,,,, ultrasound used (permanent image in chart),,    Narrative:  Start time: 07/19/2021 11:10 AM End time: 07/19/2021 11:17 AM Injection made incrementally with aspirations every 5 mL.  Performed by: Personally  Anesthesiologist: Elmer Picker, MD  Additional Notes: Monitors applied. No increased pain on injection. No increased resistance to injection. Injection made in 5cc increments. Good needle visualization. Patient tolerated procedure well.

## 2021-07-20 NOTE — Anesthesia Postprocedure Evaluation (Signed)
Anesthesia Post Note  Patient: Caitlin Sharp  Procedure(s) Performed: RIGHT FOOT 1ST RAY AMPUTATION (Right)     Patient location during evaluation: PACU Anesthesia Type: Regional and MAC Level of consciousness: awake and alert Pain management: pain level controlled Vital Signs Assessment: post-procedure vital signs reviewed and stable Respiratory status: spontaneous breathing, nonlabored ventilation, respiratory function stable and patient connected to nasal cannula oxygen Cardiovascular status: stable and blood pressure returned to baseline Postop Assessment: no apparent nausea or vomiting Anesthetic complications: no   No notable events documented.  Last Vitals:  Vitals:   07/19/21 1300 07/19/21 1315  BP: 129/69 (!) 124/57  Pulse: 68 65  Resp: 19 13  Temp:  36.9 C  SpO2: 100% 97%    Last Pain:  Vitals:   07/19/21 1315  TempSrc:   PainSc: 0-No pain                 Arnika Larzelere L Rafiq Bucklin

## 2021-07-21 ENCOUNTER — Encounter (HOSPITAL_COMMUNITY): Payer: Self-pay | Admitting: Orthopedic Surgery

## 2021-07-22 ENCOUNTER — Non-Acute Institutional Stay (SKILLED_NURSING_FACILITY): Payer: Medicare Other | Admitting: Internal Medicine

## 2021-07-22 ENCOUNTER — Encounter: Payer: Self-pay | Admitting: Internal Medicine

## 2021-07-22 DIAGNOSIS — I255 Ischemic cardiomyopathy: Secondary | ICD-10-CM | POA: Diagnosis not present

## 2021-07-22 DIAGNOSIS — E1059 Type 1 diabetes mellitus with other circulatory complications: Secondary | ICD-10-CM | POA: Diagnosis not present

## 2021-07-22 DIAGNOSIS — E785 Hyperlipidemia, unspecified: Secondary | ICD-10-CM | POA: Diagnosis not present

## 2021-07-22 DIAGNOSIS — S98111A Complete traumatic amputation of right great toe, initial encounter: Secondary | ICD-10-CM | POA: Diagnosis not present

## 2021-07-22 LAB — T4, FREE: Free T4: 6

## 2021-07-22 NOTE — Progress Notes (Signed)
Location:   [facility] Room Number: 156 Place of Service:  SNF [phone]) Provider:  Veleta Miners MD  [name], MD  Patient Care Team: [name], MD as PCP - General (Internal Medicine) San Morelle, MD as Referring Physician (Ophthalmology) Atilano Median, Gayleen Orem, MD as Referring Physician (Cardiology) Alyssa Grove, MD as Referring Physician (Cardiology)  Extended Emergency Contact Information Primary Emergency Contact: Caitlin Sharp Address: [address]          [place], [place] [address] Caitlin Sharp of Caitlin Sharp Phone: [phone] Mobile Phone: [phone] Relation: Son Preferred language: English Interpreter needed? No Secondary Emergency Contact: [name],[name] Mobile Phone: [phone] Relation: Other  Code Status:  DNR Goals of care: Advanced Directive information Advanced Directives 07/22/2021  Does Patient Have a Medical Advance Directive? Yes  Type of Advance Directive [place]  Does patient want to make changes to medical advance directive? -  Copy of [place] in Chart? -  Would patient like information on creating a medical advance directive? -     Chief Complaint  Patient presents with   Acute Visit    HPI:  Pt is a 70 y.o. female seen today for an acute visit for Admission to SNF  Admitted for Elective Amputation of Right Foot 1St ray by Dr Sharol Given on 10/28  Patient was diagnosed with Osteomyelitis on 7/27 Underwent Right Great toe  amputation on 7/27  H/o Type 1 Diabetes For past 50 years. Has been on Pump but now Long Acting Insulin and Humalog now CAD S/o CABG in 2012 osteoporosis 2021 -2.7 Glaucoma with Poor Vision Also Was admitted in the hospital [place] in Jan of this year with DKA, NSTEMI, Enterococcus Bacteremia, Acute DVT Cath no Stenosis Medical Management EF 40%  Went to see Dr Sharol Given who noticed wound Dehiscence from previous surgery in  7/27 Underwent further Amputation She says after that Wound vac was placed which came off by itself Patient also had 2 episodes of bleeding from the site Also c/o Pain in that area.   Her CBGs are running between 100- 300 She follows very closely with [place] Endocrin[place] They get her CBG data from her reader Appetite is good. No Fever   Past Medical History:  Diagnosis Date   Abnormal thyroid blood test    Per Records recevied from [facility], Dr.Pearson   Acute deep vein thrombosis of right peroneal vein ([place]) 10/05/2020   RLE DVT   Anxiety    Arthritis    RA   Coronary artery disease    Diabetes type I [facility])    Per [facility] New Patient Packet   Diabetic retinopathy ([place])    Per [facility] New Patient Packet   DKA (diabetic ketoacidosis) ([place]) 2021   Dr.William Zackowski.  Insulin Pump was out of Insulin   Glaucoma    Per [place] Patient Packet   H/O heart bypass surgery 2012   Dr. Luana Shu, Per [facility] New Patient Packet   Heart murmur    when I was a teenager   History of blood transfusion    History of kidney stones    passed   Hx of CABG    Per records received form [facility], Dr.Pearson    Left bundle branch block    Mild mitral regurgitation    Per records from [facility] [place]    Neuropathy 1976   Per [place] Patient Packet   NSTEMI (non-ST elevated myocardial  infarction) (HCC) 09/21/2020   NSTEMI/demand ischemic in setting of DKA, patent grafts by 09/24/20 LHC, continue medical therapy   Osteoporosis    T Score -2.7 , Per [facility] New Patient Packet   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)    Per Records recevied from [facility], Dr.Pearson   Past Surgical History:  Procedure Laterality Date   AMPUTATION Right 04/17/2021   Procedure: RIGHT GREAT TOE AMPUTATION;  Surgeon: [name], MD;  Location: [facility] OR;  Service: Orthopedics;  Laterality: Right;   AMPUTATION Right 07/19/2021    Procedure: RIGHT FOOT 1ST RAY AMPUTATION;  Surgeon: [name], MD;  Location: [facility] OR;  Service: Orthopedics;  Laterality: Right;   CESAREAN SECTION  1970   Dr. Aquilla Hacker, Per [facility] New Patient Packet   CESAREAN SECTION  1973   Dr. Aquilla Hacker, Per [facility] New Patient Packet   CORONARY ARTERY BYPASS GRAFT      Allergies  Allergen Reactions   Penicillins Anaphylaxis    Allergies as of 07/22/2021       Reactions   Penicillins Anaphylaxis        Medication List        Accurate as of July 22, 2021  9:12 AM. If you have any questions, ask your nurse or doctor.          Accu-Chek Softclix Lancets lancets USE 1 TO CHECK GLUCOSE THREE TIMES DAILY   acetaminophen 325 MG tablet Commonly known as: TYLENOL Take 650 mg by mouth every 4 (four) hours as needed.   aspirin EC 81 MG tablet Take 81 mg by mouth in the morning. Swallow whole.   atorvastatin 40 MG tablet Commonly known as: LIPITOR Take 40 mg by mouth at bedtime.   brimonidine 0.2 % ophthalmic solution Commonly known as: ALPHAGAN Place 1 drop into the left eye 3 (three) times daily.   CALCIUM-CARB 600 PO Take 600 mg by mouth in the morning.   clopidogrel 75 MG tablet Commonly known as: PLAVIX Take 75 mg by mouth in the morning.   Fish Oil 1000 MG Caps Take 1,000 mg by mouth in the morning.   furosemide 20 MG tablet Commonly known as: LASIX Take 20 mg by mouth in the morning.   glucosamine-chondroitin 500-400 MG tablet Take 1 tablet by mouth every morning.   glucose blood test strip 1 each by Other route as needed for other. Accu-chek   HAIR/SKIN/NAILS PO Take 1 tablet by mouth in the morning.   HYDROcodone-acetaminophen 5-325 MG tablet Commonly known as: NORCO/VICODIN Take 1 tablet by mouth every 4 (four) hours as needed.   I-VITE PO Take 1 tablet by mouth in the morning. True Vision Eye Health Capsule   insulin lispro 100 UNIT/ML KwikPen Commonly known as: HUMALOG Inject 0-10 Units into the  skin 3 (three) times daily. PATIENT SELF ADMINISTERS   Lantus SoloStar 100 UNIT/ML Solostar Pen Generic drug: insulin glargine Inject 14 Units into the skin at bedtime. PATIENT SELF ADMINISTERS   latanoprost 0.005 % ophthalmic solution Commonly known as: XALATAN Place 1 drop into both eyes every evening.   lisinopril 2.5 MG tablet Commonly known as: ZESTRIL Take 2.5 mg by mouth in the morning.   nitroGLYCERIN 0.4 MG SL tablet Commonly known as: NITROSTAT Place 0.4 mg under the tongue every 5 (five) minutes x 3 doses as needed for chest pain.   Pen Needles 3/16" 31G X 5 MM Misc 1 Device by  Does not apply route 5 (five) times daily. With insulin administration   sennosides-docusate sodium 8.6-50 MG tablet Commonly known as: SENOKOT-S Take 1 tablet by mouth every other day. IN THE MORNING   timolol 0.5 % ophthalmic solution Commonly known as: TIMOPTIC Place 1 drop into the left eye in the morning and at bedtime.        Review of Systems  Constitutional:  Positive for activity change.  HENT: Negative.    Respiratory: Negative.    Cardiovascular: Negative.   Gastrointestinal:  Positive for constipation.  Genitourinary: Negative.   Musculoskeletal:  Positive for gait problem.  Skin:  Positive for wound.  Neurological:  Negative for dizziness.  Psychiatric/Behavioral: Negative.     Immunization History  Administered Date(s) Administered   Influenza, High Dose Seasonal PF 09/01/2005   PFIZER(Purple Top)SARS-COV-2 Vaccination 12/02/2019, 12/26/2019   Tdap 07/11/2014   Pertinent  Health Maintenance Due  Topic Date Due   COLONOSCOPY (Pts 45-4yrs Insurance coverage will need to be confirmed)  Never done   MAMMOGRAM  Never done   DEXA SCAN  Never done   INFLUENZA VACCINE  04/22/2021   Fall Risk 04/13/2021 04/17/2021 07/19/2021  Patient Fall Risk Level Low fall risk High fall risk Moderate fall risk   Functional Status Survey:    Vitals:   07/22/21 0846  BP: (!)  151/79  Pulse: 84  Resp: 18  Temp: 98.6 F (37 C)  SpO2: 100%  Weight: 125 lb 6.4 oz (56.9 kg)  Height: 5\' 7"  (1.702 m)   Body mass index is 19.64 kg/m. Physical Exam Constitutional: Oriented to person, place, and time. Well-developed and well-nourished.  HENT:  Head: Normocephalic.  Mouth/Throat: Oropharynx is clear and moist.  Eyes: Pupils are equal, round, and reactive to light.  Neck: Neck supple.  Cardiovascular: Normal rate and normal heart sounds.  No murmur heard. Pulmonary/Chest: Effort normal and breath sounds normal. No respiratory distress. No wheezes. She has no rales.  Abdominal: Soft. Bowel sounds are normal. No distension. There is no tenderness. There is no rebound.  Musculoskeletal: No edema. Right wound Exam Swelling around first few Sutures  No Bleeding Tender. Some redness around the Incision Lymphadenopathy: none Neurological: Alert and oriented to person, place, and time.  Skin: Skin is warm and dry.  Psychiatric: Normal mood and affect. Behavior is normal. Thought content normal.   Labs reviewed: Recent Labs    03/19/21 0000 04/13/21 1825 07/19/21 1102  NA 142 137 135  K 4.1 4.1 4.2  CL 101 100 99  CO2 27* 26 27  GLUCOSE  --  155* 235*  BUN 35* 31* 28*  CREATININE 1.0 1.03* 1.01*  CALCIUM 9.8 9.5 9.1   Recent Labs    03/19/21 0000 04/13/21 1825  AST 29 19  ALT 24 9  ALKPHOS 99 79  BILITOT  --  0.4  PROT  --  7.3  ALBUMIN 4.4 3.8   Recent Labs    03/19/21 0000 04/13/21 1825 07/19/21 1102  WBC 5.4 7.5 8.0  NEUTROABS  --  4.4  --   HGB 11.4* 10.7* 11.5*  HCT 32* 32.0* 35.0*  MCV  --  90.9 91.4  PLT 280 331 276   Lab Results  Component Value Date   TSH 2.90 06/10/2021   Lab Results  Component Value Date   HGBA1C 9.4 03/19/2021   Lab Results  Component Value Date   CHOL 179 03/19/2021   HDL 83 (A) 03/19/2021   LDLCALC 86 03/19/2021  TRIG 52 03/19/2021    Significant Diagnostic Results in last 30 days:  No  results found.  Assessment/Plan Type 1 diabetes mellitus with other circulatory complication (HCC) Lantus incrased to 14 units per Endocrinology Last A1C 9.2 in 9/22 Continues to follow closely with them Takes Short acting Insulin per her CBG readings herself  Amputation of right Foot First Ray Continues to have some bleeding and Swelling She will see Dr Sharol Given tomorrow On Norco for  pain Also Talked to therapy to see if she will agrees to work with them on her Balance as she is suppose to keep Pressure off the Incision  Hyperlipidemia, unspecified hyperlipidemia type On Statin LDL 86 in 6/22 Ischemic cardiomyopathy EF 50 % 9/22 On Lisinopril and Lasix Could not tolerate beta blockers due to low BP and dizziness NSTEMI (non-ST elevated myocardial infarction) ([place]) In setting of her DKA Cath was negative for any stenosis Just saw her Cardiologist On Dual Therpay till Jan 2023 per cardiology Age-related osteoporosis without current pathological fracture Will Need repeat DEXA in 2023.  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

## 2021-07-23 ENCOUNTER — Encounter: Payer: Self-pay | Admitting: Orthopedic Surgery

## 2021-07-23 ENCOUNTER — Ambulatory Visit (INDEPENDENT_AMBULATORY_CARE_PROVIDER_SITE_OTHER): Payer: Medicare Other | Admitting: Orthopedic Surgery

## 2021-07-23 DIAGNOSIS — S98111A Complete traumatic amputation of right great toe, initial encounter: Secondary | ICD-10-CM

## 2021-07-23 NOTE — Progress Notes (Signed)
Office Visit Note   Patient: Caitlin Sharp           Date of Birth: [birth year]           MRN: [id number] Visit Date: 07/23/2021              Requested by: [name], MD [address] [place],  [place] [address] PCP: [name], MD  Chief Complaint  Patient presents with   Right Foot - Routine Post Op    1st ray amputation 07/19/21      HPI: Patient is a 70 year old woman who presents for initial evaluation status post right foot first ray amputation.  Patient did have increased bleeding that required the removal of the wound VAC.  Assessment & Plan: Visit Diagnoses:  1. Amputation of right great toe [facility])     Plan: Patient is given a prescription for doxycycline 100 mg twice a day.  Recommend Dial soap cleansing daily wearing the compression sock around-the-clock and changing this daily and continue with nonweightbearing if possible on the right.  Follow-Up Instructions: Return in about 2 weeks (around 08/06/2021).   Ortho Exam  Patient is alert, oriented, no adenopathy, well-dressed, normal affect, normal respiratory effort. Examination there is some mild cellulitis dorsally.  There is good approximation of the wound edges there is no wound dehiscence no ischemic changes.  Imaging: No results found. No images are attached to the encounter.  Labs: Lab Results  Component Value Date   HGBA1C 9.4 03/19/2021   REPTSTATUS 04/18/2021 FINAL 04/13/2021   REPTSTATUS 04/18/2021 FINAL 04/13/2021   CULT  04/13/2021    NO GROWTH 5 DAYS Performed at [facility], [address], [place], [place] [address]    CULT  04/13/2021    NO GROWTH 5 DAYS Performed at [facility], [address], [place], [place] [address]      Lab Results  Component Value Date   ALBUMIN 3.8 04/13/2021   ALBUMIN 4.4 03/19/2021    No results found for: MG No results found for: VD25OH  No results found for: PREALBUMIN CBC EXTENDED Latest Ref Rng & Units 07/19/2021  04/13/2021 03/19/2021  WBC 4.0 - 10.5 K/uL 8.0 7.5 5.4  RBC 3.87 - 5.11 MIL/uL 3.83(L) 3.52(L) 3.58(A)  HGB 12.0 - 15.0 g/dL 11.5(L) 10.7(L) 11.4(A)  HCT 36.0 - 46.0 % 35.0(L) 32.0(L) 32(A)  PLT 150 - 400 K/uL 276 331 280  NEUTROABS 1.7 - 7.7 K/uL - 4.4 -  LYMPHSABS 0.7 - 4.0 K/uL - 2.1 -     There is no height or weight on file to calculate BMI.  Orders:  No orders of the defined types were placed in this encounter.  No orders of the defined types were placed in this encounter.    Procedures: No procedures performed  Clinical Data: No additional findings.  ROS:  All other systems negative, except as noted in the HPI. Review of Systems  Objective: Vital Signs: There were no vitals taken for this visit.  Specialty Comments:  No specialty comments available.  PMFS History: Patient Active Problem List   Diagnosis Date Noted   Osteomyelitis of great toe of right foot [facility])    Past Medical History:  Diagnosis Date   Abnormal thyroid blood test    Per [facility] recevied from [facility], Dr.Pearson   Acute deep vein thrombosis of right peroneal vein (HCC) 10/05/2020   RLE DVT   Anxiety    Arthritis  RA   Coronary artery disease    Diabetes type I [facility])    Per [facility] New Patient Packet   Diabetic retinopathy [facility])    Per [facility] New Patient Packet   DKA (diabetic ketoacidosis) (HCC) 2021   Dr.William Zackowski.  Insulin Pump was out of Insulin   Glaucoma    Per [facility] New Patient Packet   H/O heart bypass surgery 2012   Dr. Excell Seltzer, Per [facility] New Patient Packet   Heart murmur    when I was a teenager   History of blood transfusion    History of kidney stones    passed   Hx of CABG    Per [facility] received [facility], Dr.Pearson    Left bundle branch block    Mild mitral regurgitation    Per [facility], Dr.Pearson    Neuropathy 1976   Per [facility] New Patient Packet   NSTEMI (non-ST elevated myocardial infarction) (HCC) 09/21/2020   NSTEMI/demand  ischemic in setting of DKA, patent grafts by 09/24/20 LHC, continue medical therapy   Osteoporosis    T Score -2.7 , Per [facility] New Patient Packet   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 due to underlying condition (HCC)    Per [facility] rece[facility]ied from [facility], Dr.Pearson    Family History  Problem Relation Age of Onset   Congestive Heart Failure Mother        Per [facility] New Patient Packet   Arthritis Mother        Per [facility] New Patient Packet   Osteoporosis Mother        Per [facility] New Patient Packet   Heart attack Father        Per [facility] New Patient Packet   Diabetes Sister        Per [facility] New Patient Packet   Emphysema Brother        Per [facility] New Patient Packet   Heart disease Brother        Per [facility] New Patient Packet    Past Surgical History:  Procedure Laterality Date   AMPUTATION Right 04/17/2021   Procedure: RIGHT GREAT TOE AMPUTATION;  Surgeon: [name], MD;  Location: [facility] OR;  Service: Orthopedics;  Laterality: Right;   AMPUTATION Right 07/19/2021   Procedure: RIGHT FOOT 1ST RAY AMPUTATION;  Surgeon: [name], MD;  Location: [facility] OR;  Service: Orthopedics;  Laterality: Right;   CESAREAN SECTION  1970   Dr. Aquilla Hacker, Per [facility] New Patient Packet   CESAREAN SECTION  1973   Dr. Aquilla Hacker, Per [facility] New Patient Packet   CORONARY ARTERY BYPASS GRAFT     Social History   Occupational History   Not on file  Tobacco Use   Smoking status: Never   Smokeless tobacco: Never  Vaping Use   Vaping Use: Never used  Substance and Sexual Activity   Alcohol use: Never   Drug use: Never   Sexual activity: Not on file

## 2021-07-25 ENCOUNTER — Telehealth: Payer: Self-pay | Admitting: Orthopedic Surgery

## 2021-07-25 NOTE — Telephone Encounter (Signed)
I spoke with Caitlin Sharp and she says that the gauze is sticking to the sutures. Asking to use nonadherent  dressing and cont with dial soap cleaning along with compression sock. I gave her verbal okay.

## 2021-07-25 NOTE — Telephone Encounter (Signed)
Danielle with nursing called stating when she was taking the pts compression sock off the telpa snagged the sutures causing it to bleed in a few spots. Duwayne Heck would like to have an order stating they can use nonadherent dressings and change them daily.   Danielle CB# [phone]

## 2021-07-29 ENCOUNTER — Encounter: Payer: Medicare Other | Admitting: Orthopedic Surgery

## 2021-08-05 ENCOUNTER — Telehealth: Payer: Self-pay | Admitting: Radiology

## 2021-08-05 NOTE — Telephone Encounter (Signed)
Will hold this message and call if there are any changes to treatment following the pts appt tomorrow.

## 2021-08-05 NOTE — Telephone Encounter (Signed)
Received call from Chante with [facility]. Patient is status post amputation of right great toe on 07/19/2021.  She has an opening around one of her sutures and has a red line that is going up her foot. She is also having increased pain, tenderness, and redness up her leg. There is an area on top of her foot that is triangle shaped that is tender to touch. Chante is unsure if this is continued cellulitis and states that patient completed her antibiotics. She has not put her in the support stockings today as they cause the patient increased pain when putting them on or taking them off. She has put a foam dressing over the wound and wrapped it in Kerlix. Patient does not have a fever.   I offered work in appt for this afternoon, however, patient wants to keep her regularly scheduled appt for tomorrow afternoon. Please call Chante if there is anything that you would like for her to do differently prior to patient's appointment.  CB [phone]

## 2021-08-06 ENCOUNTER — Ambulatory Visit (INDEPENDENT_AMBULATORY_CARE_PROVIDER_SITE_OTHER): Payer: Medicare Other | Admitting: Orthopedic Surgery

## 2021-08-06 DIAGNOSIS — Z89412 Acquired absence of left great toe: Secondary | ICD-10-CM

## 2021-08-06 DIAGNOSIS — S98111A Complete traumatic amputation of right great toe, initial encounter: Secondary | ICD-10-CM

## 2021-08-08 NOTE — Telephone Encounter (Signed)
Nitro patch , trental tid and doxy bid rx given at appt. Call to advise.

## 2021-08-09 NOTE — Telephone Encounter (Signed)
Pt has follow up 08/12/21 order had been written

## 2021-08-12 ENCOUNTER — Other Ambulatory Visit: Payer: Self-pay

## 2021-08-12 ENCOUNTER — Ambulatory Visit (INDEPENDENT_AMBULATORY_CARE_PROVIDER_SITE_OTHER): Payer: Medicare Other | Admitting: Orthopedic Surgery

## 2021-08-12 DIAGNOSIS — S98111A Complete traumatic amputation of right great toe, initial encounter: Secondary | ICD-10-CM

## 2021-08-13 ENCOUNTER — Encounter: Payer: Self-pay | Admitting: Orthopedic Surgery

## 2021-08-13 NOTE — Progress Notes (Signed)
Office Visit Note   Patient: Caitlin Sharp           Date of Birth: Jun 02, 1951           MRN: [id number] Visit Date: 08/12/2021              Requested by: [name], MD [address] [place],  [place] [address] PCP: [name], MD  Chief Complaint  Patient presents with   Right Foot - Routine Post Op    10/258/22 right foot 1st ray amputation       HPI: Patient is a 70 year old woman who presents approximately 3 weeks status post right foot first ray amputation.  Patient feels like she is weightbearing through her heel.  She is using nitroglycerin and Trental.  Assessment & Plan: Visit Diagnoses:  1. Amputation of right great toe (HCC)     Plan: Continue with the nitroglycerin patch and Trental I will continue with Dial soap cleansing and dry dressing changes discussed the importance of minimizing weightbearing.  Follow-Up Instructions: Return in about 2 weeks (around 08/26/2021).   Ortho Exam  Patient is alert, oriented, no adenopathy, well-dressed, normal affect, normal respiratory effort. Examination the proximal aspect of the wound has shown some interval healing however the distal aspect has shown some dehiscence with superficial fibrinous exudative tissue there is no depth to the wound distally but it has slight dehiscence.  Imaging: No results found. No images are attached to the encounter.  Labs: Lab Results  Component Value Date   HGBA1C 9.4 03/19/2021   REPTSTATUS 04/18/2021 FINAL 04/13/2021   REPTSTATUS 04/18/2021 FINAL 04/13/2021   CULT  04/13/2021    NO GROWTH 5 DAYS Performed at [facility], [address], [place], [place] [address]    CULT  04/13/2021    NO GROWTH 5 DAYS Performed at [facility], [address], [place], [place] [address]      Lab Results  Component Value Date   ALBUMIN 3.8 04/13/2021   ALBUMIN 4.4 03/19/2021    No results found for: MG No results found for: VD25OH  No results found for:  PREALBUMIN CBC EXTENDED Latest Ref Rng & Units 07/19/2021 04/13/2021 03/19/2021  WBC 4.0 - 10.5 K/uL 8.0 7.5 5.4  RBC 3.87 - 5.11 MIL/uL 3.83(L) 3.52(L) 3.58(A)  HGB 12.0 - 15.0 g/dL 11.5(L) 10.7(L) 11.4(A)  HCT 36.0 - 46.0 % 35.0(L) 32.0(L) 32(A)  PLT 150 - 400 K/uL 276 331 280  NEUTROABS 1.7 - 7.7 K/uL - 4.4 -  LYMPHSABS 0.7 - 4.0 K/uL - 2.1 -     There is no height or weight on file to calculate BMI.  Orders:  No orders of the defined types were placed in this encounter.  No orders of the defined types were placed in this encounter.    Procedures: No procedures performed  Clinical Data: No additional findings.  ROS:  All other systems negative, except as noted in the HPI. Review of Systems  Objective: Vital Signs: There were no vitals taken for this visit.  Specialty Comments:  No specialty comments available.  PMFS History: Patient Active Problem List   Diagnosis Date Noted   Osteomyelitis of great toe of right foot [facility])    Past Medical History:  Diagnosis Date   Abnormal thyroid blood test    Per [facility] recevied from [facility], Dr.Pearson   Acute deep vein thrombosis of right peroneal vein (HCC) 10/05/2020   RLE DVT  Anxiety    Arthritis    RA   Coronary artery disease    Diabetes type I [facility])    Per [facility] New Patient Packet   Diabetic retinopathy [facility])    Per [facility] New Patient Packet   DKA (diabetic ketoacidosis) (HCC) 2021   Dr.William Zackowski.  Insulin Pump was out of Insulin   Glaucoma    Per [facility] New Patient Packet   H/O heart bypass surgery 2012   Dr. Excell Seltzer, Per [facility] New Patient Packet   Heart murmur    when I was a teenager   History of blood transfusion    History of kidney stones    passed   Hx of CABG    Per records received form [facility], Dr.Pearson    Left bundle branch block    Mild mitral regurgitation    Per records from [facility], Dr.Pearson    Neuropathy 1976   Per [facility] New Patient Packet   NSTEMI (non-ST elevated  myocardial infarction) (HCC) 09/21/2020   NSTEMI/demand ischemic in setting of DKA, patent grafts by 09/24/20 [facility], continue medical therapy   Osteoporosis    T Score -2.7 , Per [facility] New Patient Packet   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 New Patient Packet   Osteoporosis Mother        Per [facility] New Patient Packet   Heart attack Father        Per [facility] New Patient Packet   Diabetes Sister        Per [facility] New Patient Packet   Emphysema Brother        Per [facility] New Patient Packet   Heart disease Brother        Per [facility] New Patient Packet    Past Surgical History:  Procedure Laterality Date   AMPUTATION Right 04/17/2021   Procedure: RIGHT GREAT TOE AMPUTATION;  Surgeon: [name], MD;  Location: [facility] OR;  Service: Orthopedics;  Laterality: Right;   AMPUTATION Right 07/19/2021   Procedure: RIGHT FOOT 1ST RAY AMPUTATION;  Surgeon: [name], MD;  Location[facility] OR;  Service: Orthopedics;  Laterality: Right;   CESAREAN SECTION  1970   Dr. Aquilla Hacker, Per [facility] New Patient Packet   CESAREAN SECTION  1973   Dr. Aquilla Hacker, Per [facility] New Patient Packet   CORONARY ARTERY BYPASS GRAFT     Social History   Occupational History   Not on file  Tobacco Use   Smoking status: Never   Smokeless tobacco: Never  Vaping Use   Vaping Use: Never used  Substance and Sexual Activity   Alcohol use: Never   Drug use: Never   Sexual activity: Not on file

## 2021-08-19 LAB — HEPATIC FUNCTION PANEL
ALT: 11 (ref 7–35)
AST: 16 (ref 13–35)
Alkaline Phosphatase: 144 — AB (ref 25–125)
Bilirubin, Total: 0.2

## 2021-08-19 LAB — BASIC METABOLIC PANEL
BUN: 27 — AB (ref 4–21)
CO2: 28 — AB (ref 13–22)
Chloride: 97 — AB (ref 99–108)
Creatinine: 0.9 (ref 0.5–1.1)
Glucose: 262
Potassium: 4.7 (ref 3.4–5.3)
Sodium: 136 — AB (ref 137–147)

## 2021-08-19 LAB — C-REACTIVE PROTEIN: CRP: 5.3

## 2021-08-19 LAB — CBC AND DIFFERENTIAL
HCT: 28 — AB (ref 36–46)
Hemoglobin: 9 — AB (ref 12.0–16.0)
Platelets: 447 — AB (ref 150–399)
WBC: 7.6

## 2021-08-19 LAB — COMPREHENSIVE METABOLIC PANEL
Albumin: 3.6 (ref 3.5–5.0)
Calcium: 9.4 (ref 8.7–10.7)
Globulin: 3.1

## 2021-08-19 LAB — POCT ERYTHROCYTE SEDIMENTATION RATE, NON-AUTOMATED: Sed Rate: 122

## 2021-08-19 LAB — CBC: RBC: 3.19 — AB (ref 3.87–5.11)

## 2021-08-20 ENCOUNTER — Other Ambulatory Visit: Payer: Self-pay

## 2021-08-20 ENCOUNTER — Ambulatory Visit (INDEPENDENT_AMBULATORY_CARE_PROVIDER_SITE_OTHER): Payer: Medicare Other | Admitting: Internal Medicine

## 2021-08-20 ENCOUNTER — Encounter: Payer: Self-pay | Admitting: Internal Medicine

## 2021-08-20 VITALS — BP 132/67 | HR 88 | Temp 98.7°F

## 2021-08-20 DIAGNOSIS — T148XXA Other injury of unspecified body region, initial encounter: Secondary | ICD-10-CM | POA: Diagnosis not present

## 2021-08-20 DIAGNOSIS — I739 Peripheral vascular disease, unspecified: Secondary | ICD-10-CM | POA: Diagnosis not present

## 2021-08-20 DIAGNOSIS — E109 Type 1 diabetes mellitus without complications: Secondary | ICD-10-CM | POA: Diagnosis not present

## 2021-08-20 DIAGNOSIS — E11628 Type 2 diabetes mellitus with other skin complications: Secondary | ICD-10-CM

## 2021-08-20 DIAGNOSIS — L089 Local infection of the skin and subcutaneous tissue, unspecified: Secondary | ICD-10-CM

## 2021-08-20 NOTE — Patient Instructions (Signed)
Will refer you to vascular surgery for further evaluation for vascular supply contributing to poor wound healing  Will prescribe 10 days of doxycycline and cefdinir to cover for possible soft tissue infection as well  This is a recurrent process and also at times can be slow to heal if it does  See me in 2-3 weeks

## 2021-08-20 NOTE — Progress Notes (Signed)
[facility] for Infectious Disease  Reason for Consult:diabetic foot wound Referring Provider: Veleta Miners, MD    Patient Active Problem List   Diagnosis Date Noted   Osteomyelitis of great toe of right foot [facility])       HPI: Caitlin Sharp is a 70 y.o. female referred by her nursing home physician for a non-healing right foot wound in setting of diabetes and recent surgery  Reviewed outside record   Patient initially was dx'ed with right hallux Om and had amputation (?disarticulation mtp joint -- I don't have record of that) on 7/27. The incision didn't heal completely and had dehiscence. She saw dr Sharol Given on 10/28 and had more proximal first ray amputation 10/28. Since then the wound still haven't healed and had dehisced  She reports clear discharge   She has been given doxy by dr duda without much change.   She reported having an ABI done by dr Sharol Given, who is putting a nitroglycerine patch on her dorsalis pedis artery to help promote flow  No f/c  She does have some pain dorsum proximal mid foot. No swelling. No purulence  Dr Meredith Staggers had recently checked crp 5.29 and esr 120s a few days prior to this visit. Also had xray of the foot which didn't suggest osseous involvement  This is her first process of ulcer since 03/2021  Nonsmoker On insulin pump for her diabetes type 1.   Cad with cabg 2012 Ef 40% recently; previous cath no significant new coronary atherosclerosis  There is no tissue culture  Review of Systems: ROS All other ros negative      Past Medical History:  Diagnosis Date   Abnormal thyroid blood test    Per [facility] recevied from [facility], Dr.Pearson   Acute deep vein thrombosis of right peroneal vein ([place]) 10/05/2020   RLE DVT   Anxiety    Arthritis    RA   Coronary artery disease    Diabetes type I [facility])    Per [facility] New Patient Packet   Diabetic retinopathy ([place])    Per [facility] New Patient Packet   DKA (diabetic ketoacidosis)  ([place]) 2021   Dr.William Zackowski.  Insulin Pump was out of Insulin   Glaucoma    Per [place] Patient Packet   H/O heart bypass surgery 2012   Dr. Luana Shu, Per [facility] New Patient Packet   Heart murmur    when I was a teenager   History of blood transfusion    History of kidney stones    passed   Hx of CABG    Per records received form [facility], Dr.Pearson    Left bundle branch block    Mild mitral regurgitation    Per records from [facility] [place]    Neuropathy 1976   Per [place] Patient Packet   NSTEMI (non-ST elevated myocardial infarction) ([place]) 09/21/2020   NSTEMI/demand ischemic in setting of DKA, patent grafts by 09/24/20 [facility], continue medical therapy   Osteoporosis    T Score -2.7 , Per [facility] New Patient Packet   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 due to underlying condition [facility])    Per Records recevied from [facility] [place]    Social History   Tobacco Use   Smoking status: Never   Smokeless tobacco: Never  Vaping Use   Vaping Use: Never used  Substance Use Topics   Alcohol use: Never   Drug use: Never  Family History  Problem Relation Age of Onset   Congestive Heart Failure Mother        Per [facility] New Patient Packet   Arthritis Mother        Per [facility] New Patient Packet   Osteoporosis Mother        Per [facility] New Patient Packet   Heart attack Father        Per [facility] New Patient Packet   Diabetes Sister        Per [facility] New Patient Packet   Emphysema Brother        Per [facility] New Patient Packet   Heart disease Brother        Per [facility] New Patient Packet    Allergies  Allergen Reactions   Penicillins Anaphylaxis    OBJECTIVE: Vitals:   08/20/21 1024  BP: 132/67  Pulse: 88  Temp: 98.7 F (37.1 C)  TempSrc: Temporal  SpO2: 98%   There is no height or weight on file to calculate BMI.   Physical Exam  General/constitutional: no distress, pleasant HEENT: Normocephalic, PER, Conj Clear, EOMI,  Oropharynx clear Neck supple CV: rrr no mrg Lungs: clear to auscultation, normal respiratory effort Abd: Soft, Nontender Ext: no edema Skin: see pic; slightly tender around dp area; nitroglycerine patch present on dp area Neuro: nonfocal MSK: no peripheral joint swelling/tenderness/warmth; back spines nontender  08/20/21     Lab: See hpi of 08/20/21  Microbiology:  Serology:  Imaging: See hpi of 08/20/21  Assessment/plan: Problem List Items Addressed This Visit   None Visit Diagnoses     Open wound    -  Primary   Relevant Orders   Ambulatory referral to Vascular Surgery   Diabetic foot infection (HCC)       Relevant Orders   Ambulatory referral to Vascular Surgery   Peripheral vascular disease (HCC)       Type 1 diabetes mellitus without complication (HCC)             Suspect a vascular ischemic process with tissue inflammation/poor wound healing Recent crp/esr high due to open wound. Perhaps some superimposed cellulitis present on today's 08/20/21 visit as well.   Discussed with her potential side effect/blood pressure drop with nitroglycerine patch. Discussed with her need for vascular surgery evalutation  Also discussed with her difficult nature of diabetic wound healing/recurrent infection. High risk for more surgery/bka   -today try cefdinir/doxy for 10 days for possible cellulitis -referal to vascular surgery -f/u 2-3 weeks -f/u dr Duda as needed; potentially might need wound clinic referal although dr Duda likely does manage that too -sent an epic text to dr Anjali with my plan        Follow-up: Return in about 3 weeks (around 09/10/2021).  Trung T Vu, MD [facility] [phone] pager   [phone] cell 08/20/2021, 10:27 AM  

## 2021-08-23 ENCOUNTER — Telehealth: Payer: Self-pay

## 2021-08-23 ENCOUNTER — Encounter: Payer: Self-pay | Admitting: Orthopedic Surgery

## 2021-08-23 NOTE — Telephone Encounter (Signed)
Patient is currently residing at [facility]. Community called to follow up on Vascular referral. Community left their contact information for Vascular referral to call the community to schedule appointment at [phone]. Routing to referral coordinator for follow up.  Valarie Cones

## 2021-08-23 NOTE — Progress Notes (Signed)
Office Visit Note   Patient: Caitlin Sharp           Date of Birth: 1951-02-24           MRN: [id number] Visit Date: 08/06/2021              Requested by: [name], MD [address] [place],  [place] [address] PCP: [name], MD  Chief Complaint  Patient presents with   Right Foot - Routine Post Op    1st ray amputation 07/19/21      HPI: Patient is a 70 year old woman who presents 2-1/2 weeks status post right foot first ray amputation.  She has been on doxycycline Dial soap cleansing compression socks.  Assessment & Plan: Visit Diagnoses:  1. Amputation of right great toe (HCC)     Plan: Continue with current wound care a prescription was provided for nitroglycerin and Trental and doxycycline.  Follow-Up Instructions: Return in about 1 week (around 08/13/2021).   Ortho Exam  Patient is alert, oriented, no adenopathy, well-dressed, normal affect, normal respiratory effort. Examination of Doppler was used she has a strong dorsalis pedis and posterior tibial biphasic pulse.  The proximal aspect of the incision has dehisced 1 cm.  Imaging: No results found. No images are attached to the encounter.  Labs: Lab Results  Component Value Date   HGBA1C 9.4 03/19/2021   ESRSEDRATE 122 08/19/2021   CRP 5.3 08/13/2021   REPTSTATUS 04/18/2021 FINAL 04/13/2021   REPTSTATUS 04/18/2021 FINAL 04/13/2021   CULT  04/13/2021    NO GROWTH 5 DAYS Performed at [facility], [address], [place], [place] [address]    CULT  04/13/2021    NO GROWTH 5 DAYS Performed at [facility], [address], [place], [place] [address]      Lab Results  Component Value Date   ALBUMIN 3.6 08/19/2021   ALBUMIN 3.8 04/13/2021   ALBUMIN 4.4 03/19/2021    No results found for: MG No results found for: VD25OH  No results found for: PREALBUMIN CBC EXTENDED Latest Ref Rng & Units 08/19/2021 07/19/2021 04/13/2021  WBC - 7.6 8.0 7.5  RBC 3.87 - 5.11 3.19(A)  3.83(L) 3.52(L)  HGB 12.0 - 16.0 9.0(A) 11.5(L) 10.7(L)  HCT 36 - 46 28(A) 35.0(L) 32.0(L)  PLT 150 - 399 447(A) 276 331  NEUTROABS 1.7 - 7.7 K/uL - - 4.4  LYMPHSABS 0.7 - 4.0 K/uL - - 2.1     There is no height or weight on file to calculate BMI.  Orders:  No orders of the defined types were placed in this encounter.  No orders of the defined types were placed in this encounter.    Procedures: No procedures performed  Clinical Data: No additional findings.  ROS:  All other systems negative, except as noted in the HPI. Review of Systems  Objective: Vital Signs: There were no vitals taken for this visit.  Specialty Comments:  No specialty comments available.  PMFS History: Patient Active Problem List   Diagnosis Date Noted   Osteomyelitis of great toe of right foot [facility])    Past Medical History:  Diagnosis Date   Abnormal thyroid blood test    Per Records recevied from [facility], Dr.Pearson   Acute deep vein thrombosis of right peroneal vein (HCC) 10/05/2020   RLE DVT   Anxiety    Arthritis    RA   Coronary artery disease    Diabetes type I (HCC)  Per [facility] New Patient Packet   Diabetic retinopathy [facility])    Per [facility] New Patient Packet   DKA (diabetic ketoacidosis) (HCC) 2021   Dr.William Zackowski.  Insulin Pump was out of Insulin   Glaucoma    Per [facility] New Patient Packet   H/O heart bypass surgery 2012   Dr. Excell Seltzer, Per [facility] New Patient Packet   Heart murmur    when I was a teenager   History of blood transfusion    History of kidney stones    passed   Hx of CABG    Per records received form [facility], Dr.Pearson    Left bundle branch block    Mild mitral regurgitation    Per records from [facility], Dr.Pearson    Neuropathy 1976   Per [facility] New Patient Packet   NSTEMI (non-ST elevated myocardial infarction) (HCC) 09/21/2020   NSTEMI/demand ischemic in setting of DKA, patent grafts by 09/24/20 [facility], continue medical therapy   Osteoporosis    T  Score -2.7 , Per [facility] New Patient Packet   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 due to underlying condition (HCC)    Per Records recevied from [facility], Dr.Pearson    Family History  Problem Relation Age of Onset   Congestive Heart Failure Mother        Per [facility] New Patient Packet   Arthritis Mother        Per [facility] New Patient Packet   Osteoporosis Mother        Per [facility] New Patient Packet   Heart attack Father        Per [facility] New Patient Packet   Diabetes Sister        Per [facility] New Patient Packet   Emphysema Brother        Per [facility] New Patient Packet   Heart disease Brother        Per [facility] New Patient Packet    Past Surgical History:  Procedure Laterality Date   AMPUTATION Right 04/17/2021   Procedure: RIGHT GREAT TOE AMPUTATION;  Surgeon: [name], MD;  Location: [facility] OR;  Service: Orthopedics;  Laterality: Right;   AMPUTATION Right 07/19/2021   Procedure: RIGHT FOOT 1ST RAY AMPUTATION;  Surgeon: [name], MD;  Location: [facility] OR;  Service: Orthopedics;  Laterality: Right;   CESAREAN SECTION  1970   Dr. Aquilla Hacker, Per [facility] New Patient Packet   CESAREAN SECTION  1973   Dr. Aquilla Hacker, Per [facility] New Patient Packet   CORONARY ARTERY BYPASS GRAFT     Social History   Occupational History   Not on file  Tobacco Use   Smoking status: Never   Smokeless tobacco: Never  Vaping Use   Vaping Use: Never used  Substance and Sexual Activity   Alcohol use: Never   Drug use: Never   Sexual activity: Not on file

## 2021-08-27 ENCOUNTER — Encounter: Payer: Medicare Other | Admitting: Orthopedic Surgery

## 2021-08-28 ENCOUNTER — Other Ambulatory Visit: Payer: Self-pay

## 2021-08-28 DIAGNOSIS — I739 Peripheral vascular disease, unspecified: Secondary | ICD-10-CM

## 2021-09-05 ENCOUNTER — Telehealth: Payer: Self-pay | Admitting: Orthopedic Surgery

## 2021-09-05 ENCOUNTER — Ambulatory Visit (INDEPENDENT_AMBULATORY_CARE_PROVIDER_SITE_OTHER): Payer: Medicare Other | Admitting: Orthopedic Surgery

## 2021-09-05 DIAGNOSIS — S98111A Complete traumatic amputation of right great toe, initial encounter: Secondary | ICD-10-CM

## 2021-09-05 DIAGNOSIS — Z89411 Acquired absence of right great toe: Secondary | ICD-10-CM

## 2021-09-05 NOTE — Telephone Encounter (Signed)
Pt called and states she was suppose to have an appt today to have her stitches out? There is no such appt. Also states that she was seen on 12/06 and it was marked as NO SHOW.   [phone]

## 2021-09-05 NOTE — Telephone Encounter (Signed)
After looking at the schedule on 08/27/21 she did not come to this appt. So she was marked a no show.

## 2021-09-05 NOTE — Telephone Encounter (Signed)
Pt is on schedule today at 2pm. Not sure what the "no show" is from but her notes are completed and noted that she was seen.

## 2021-09-06 ENCOUNTER — Encounter: Payer: Self-pay | Admitting: Orthopedic Surgery

## 2021-09-06 ENCOUNTER — Other Ambulatory Visit: Payer: Self-pay

## 2021-09-06 ENCOUNTER — Ambulatory Visit (INDEPENDENT_AMBULATORY_CARE_PROVIDER_SITE_OTHER): Payer: Medicare Other | Admitting: Infectious Disease

## 2021-09-06 VITALS — BP 164/75 | HR 76 | Temp 98.3°F

## 2021-09-06 DIAGNOSIS — I2510 Atherosclerotic heart disease of native coronary artery without angina pectoris: Secondary | ICD-10-CM

## 2021-09-06 DIAGNOSIS — Z794 Long term (current) use of insulin: Secondary | ICD-10-CM | POA: Diagnosis not present

## 2021-09-06 DIAGNOSIS — I82451 Acute embolism and thrombosis of right peroneal vein: Secondary | ICD-10-CM

## 2021-09-06 DIAGNOSIS — M869 Osteomyelitis, unspecified: Secondary | ICD-10-CM | POA: Diagnosis not present

## 2021-09-06 NOTE — Progress Notes (Signed)
Office Visit Note   Patient: Caitlin Sharp           Date of Birth: Feb 27, 1951           MRN: [id number] Visit Date: 09/05/2021              Requested by: [name], MD [address] [place],  [place] [address] PCP: [name], MD  Chief Complaint  Patient presents with   Right Foot - Routine Post Op    1st ray amputation 07/19/21      HPI: Patient is a 70 year old woman who is status post right foot first ray amputation.  She is still using nitroglycerin patch and Trental.  Patient states her foot has improved since the last visit.  Assessment & Plan: Visit Diagnoses:  1. Amputation of right great toe [facility])     Plan: Patient will continue with current treatment continue with protected weightbearing recommended using her compression socks.  Follow-Up Instructions: Return in about 2 weeks (around 09/19/2021).   Ortho Exam  Patient is alert, oriented, no adenopathy, well-dressed, normal affect, normal respiratory effort. Examination the wound continues to heal well.  2 open wounds 1 is proximal that is 1 x 2 cm the other distal is 1 cm in diameter.  There is approximately 50% fibrinous tissue in the wound bed.  No exposed bone or tendon no cellulitis.  Imaging: No results found. No images are attached to the encounter.  Labs: Lab Results  Component Value Date   HGBA1C 9.4 03/19/2021   ESRSEDRATE 122 08/19/2021   CRP 5.3 08/13/2021   REPTSTATUS 04/18/2021 FINAL 04/13/2021   REPTSTATUS 04/18/2021 FINAL 04/13/2021   CULT  04/13/2021    NO GROWTH 5 DAYS Performed at [facility], [address], [place], [place] [address]    CULT  04/13/2021    NO GROWTH 5 DAYS Performed at [facility], [address] [place], [place] [address]      Lab Results  Component Value Date   ALBUMIN 3.6 08/19/2021   ALBUMIN 3.8 04/13/2021   ALBUMIN 4.4 03/19/2021    No results found for: MG No results found for: VD25OH  No results found for:  PREALBUMIN CBC EXTENDED Latest Ref Rng & Units 08/19/2021 07/19/2021 04/13/2021  WBC - 7.6 8.0 7.5  RBC 3.87 - 5.11 3.19(A) 3.83(L) 3.52(L)  HGB 12.0 - 16.0 9.0(A) 11.5(L) 10.7(L)  HCT 36 - 46 28(A) 35.0(L) 32.0(L)  PLT 150 - 399 447(A) 276 331  NEUTROABS 1.7 - 7.7 K/uL - - 4.4  LYMPHSABS 0.7 - 4.0 K/uL - - 2.1     There is no height or weight on file to calculate BMI.  Orders:  No orders of the defined types were placed in this encounter.  No orders of the defined types were placed in this encounter.    Procedures: No procedures performed  Clinical Data: No additional findings.  ROS:  All other systems negative, except as noted in the HPI. Review of Systems  Objective: Vital Signs: There were no vitals taken for this visit.  Specialty Comments:  No specialty comments available.  PMFS History: Patient Active Problem List   Diagnosis Date Noted   Osteomyelitis of great toe of right foot (HCC)    NSVT (nonsustained ventricular tachycardia) 01/17/2021   Acute deep vein thrombosis (DVT) of right peroneal vein (HCC) 10/17/2020   Insulin long-term use (HCC) 07/18/2020   Bruit of left carotid artery 10/31/2019  Coronary artery disease involving native coronary artery of native heart without angina pectoris 10/31/2019   Open-angle glaucoma of right eye 10/31/2019   Dry eyes 03/01/2018   Presence of intraocular lens 10/18/2015   Cystoid macular edema 06/14/2012   Past Medical History:  Diagnosis Date   Abnormal thyroid blood test    Per [facility] rece[facility]ied [facility], Dr.Pearson   Acute deep vein thrombosis of right peroneal vein (HCC) 10/05/2020   RLE DVT   Anxiety    Arthritis    RA   Coronary artery disease    Diabetes type I [facility])    Per [facility] New Patient Packet   Diabetic retinopathy (HCC)    Per [facility] New Patient Packet   DKA (diabetic ketoacidosis) (HCC) 2021   Dr.William Zackowski.  Insulin Pump was out of Insulin   Glaucoma    Per [facility] New Patient  Packet   H/O heart bypass surgery 2012   Dr. Excell Seltzer, Per [facility] New Patient Packet   Heart murmur    when I was a teenager   History of blood transfusion    History of kidney stones    passed   Hx of CABG    Per records received form [facility], Dr.Pearson    Left bundle branch block    Mild mitral regurgitation    Per records from [facility], Dr.Pearson    Neuropathy 1976   Per [facility] New Patient Packet   NSTEMI (non-ST elevated myocardial infarction) (HCC) 09/21/2020   NSTEMI/demand ischemic in setting of DKA, patent grafts by 09/24/20 [facility], continue medical therapy   Osteoporosis    T Score -2.7 , Per [facility] New Patient Packet   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 due to underlying condition (HCC)    Per [facility] rece[facility]ied [facility], Dr.Pearson    Family History  Problem Relation Age of Onset   Congestive Heart Failure Mother        Per [facility] New Patient Packet   Arthritis Mother        Per [facility] New Patient Packet   Osteoporosis Mother        Per [facility] New Patient Packet   Heart attack Father        Per [facility] New Patient Packet   Diabetes Sister        Per [facility] New Patient Packet   Emphysema Brother        Per [facility] New Patient Packet   Heart disease Brother        Per [facility] New Patient Packet    Past Surgical History:  Procedure Laterality Date   AMPUTATION Right 04/17/2021   Procedure: RIGHT GREAT TOE AMPUTATION;  Surgeon: [name], MD;  Location: [facility] OR;  Service: Orthopedics;  Laterality: Right;   AMPUTATION Right 07/19/2021   Procedure: RIGHT FOOT 1ST RAY AMPUTATION;  Surgeon: [name], MD;  Location: [facility] OR;  Service: Orthopedics;  Laterality: Right;   CESAREAN SECTION  1970   Dr. Aquilla Hacker, Per [facility] New Patient Packet   CESAREAN SECTION  1973   Dr. Aquilla Hacker, Per [facility] New Patient Packet   CORONARY ARTERY BYPASS GRAFT     Social History   Occupational History   Not on file  Tobacco Use   Smoking status: Never    Smokeless tobacco: Never  Vaping Use   Vaping Use: Never used  Substance and Sexual Activity   Alcohol use: Never   Drug use: Never  Sexual activity: Not on file

## 2021-09-06 NOTE — Progress Notes (Signed)
Subjective:  Chief complaint she is tired of going to see doctors multiple specialties and is kind of worn out by the process of trying to get her foot to heal Dr. Sharol Given  [name], female    DOB: May 22, 1951, 70 y.o.   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  In his did not heal completely and had dehiscence decided to do that on October 28 and had a more proximal first ray amputation.  Since then wound is still has not completely healed and there is some additional dehiscence.  Review had ordered ABI and placed nitroglycerin patch.  When she was seen by my partner Dr. Gale Journey on August 20, 2021.  He had suspicions that this process was related to vascular ischemia.  He placed her on left ear and doxycycline for a 10-day course.  He made referral to vascular surgery which is coming up in December 23.  Patient seems by encounter tab to have seen Dr. Sharol Given yesterday though I cannot see any notes.  Staples were removed however and the patient says that she has had dramatic improvement since she was seen by Dr. Gale Journey.  I have reviewed labs and I am bothered by the fact that her sedimentation rate was 129 and we do not have an explanation for this besides the concern that she could have still residual deep infection i.e. osteomyelitis.  Apparently plain [facility] at [facility] did not show osteomyelitis.       Past Medical History:  Diagnosis Date   Abnormal thyroid blood test    Per Records recevied from [facility], Dr.Pearson   Acute deep vein thrombosis of right peroneal vein ([place]) 10/05/2020   RLE DVT   Anxiety    Arthritis    RA   Coronary artery disease    Diabetes type I [facility])    Per [facility] New Patient Packet    Diabetic retinopathy ([place])    Per [facility] New Patient Packet   DKA (diabetic ketoacidosis) ([place]) 2021   Dr.William Zackowski.  Insulin Pump was out of Insulin   Glaucoma    Per [place] Patient Packet   H/O heart bypass surgery 2012   Dr. Luana Shu, Per [facility] New Patient Packet   Heart murmur    when I was a teenager   History of blood transfusion    History of kidney stones    passed   Hx of CABG    Per records received form [facility], Dr.Pearson    Left bundle branch block    Mild mitral regurgitation    Per records from [facility] [place]    Neuropathy 1976   Per [place] Patient Packet   NSTEMI (non-ST elevated myocardial infarction) ([place]) 09/21/2020   NSTEMI/demand ischemic in setting of DKA, patent grafts by 09/24/20 [facility], continue medical therapy   Osteoporosis    T Score -2.7 , Per [place] New Patient Packet   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 Moses Manners, Per [facility] New Patient Packet   CESAREAN SECTION  1973   Dr. Moses Manners, Per [facility] New Patient Packet   CORONARY ARTERY BYPASS GRAFT      Family History  Problem Relation Age of Onset   Congestive Heart Failure Mother        Per [facility] New Patient Packet   Arthritis Mother        Per [place] Patient Packet   Osteoporosis Mother        Per [facility] New Patient Packet   Heart attack Father        Per [facility] New Patient Packet   Diabetes Sister        Per [facility] New Patient Packet   Emphysema Brother         Per [facility] New Patient Packet   Heart disease Brother        Per [facility] New Patient Packet      Social History   Socioeconomic History   Marital status: Widowed    Spouse name: Not on file   Number of children: Not on file   Years of education: Not on file   Highest education level: Not on file  Occupational History   Not on file  Tobacco Use   Smoking status: Never   Smokeless tobacco: Never  Vaping Use   Vaping Use: Never used  Substance and Sexual Activity   Alcohol use: Never   Drug use: Never   Sexual activity: Not on file  Other Topics Concern   Not on file  Social History Narrative   Per [place] Patient Packet Abstracted on 02/14/2021      Diet: Left blank       Caffeine: Yes      Married, if yes what year: Widowed, married in [place] you live in a house, apartment, assisted living, condo, trailer, ect: Assisted Living       Is it one or more stories: 2      How many persons live in your home?One      Pets:No      Highest level or education completed:       Current/Past profession: Accountant       Exercise:        Yes          Type and how often: Walking 3-4 time weekly          Living Will: Yes   DNR: No   POA/HPOA: Yes      Social Determinants of Radio broadcast assistant Strain: Not on file  Food Insecurity: Not on file  Transportation Needs: Not on file  Physical Activity: Not on file  Stress: Not on file  Social Connections: Not on file    Allergies  Allergen Reactions   Penicillins Anaphylaxis     Current Outpatient Medications:    Accu-Chek Softclix Lancets lancets, USE 1 TO CHECK GLUCOSE THREE TIMES DAILY, Disp: , Rfl:    aspirin EC 81 MG tablet, Take 81 mg by mouth in the morning. Swallow whole., Disp: , Rfl:    atorvastatin (LIPITOR) 40 MG tablet, Take 40 mg by mouth at bedtime., Disp: , Rfl:    Biotin  w/ Vitamins C & E (HAIR/SKIN/NAILS PO), Take 1 tablet by mouth in the morning., Disp: , Rfl:    brimonidine (ALPHAGAN)  0.2 % ophthalmic solution, Place 1 drop into the left eye 3 (three) times daily., Disp: , Rfl:    Calcium Carbonate (CALCIUM-CARB 600 PO), Take 600 mg by mouth in the morning., Disp: , Rfl:    clopidogrel (PLAVIX) 75 MG tablet, Take 75 mg by mouth in the morning., Disp: , Rfl:    doxycycline (VIBRAMYCIN) 100 MG capsule, Take 100 mg by mouth daily., Disp: , Rfl:    furosemide (LASIX) 20 MG tablet, Take 20 mg by mouth in the morning., Disp: , Rfl:    glucosamine-chondroitin 500-400 MG tablet, Take 1 tablet by mouth every morning., Disp: , Rfl:    glucose blood test strip, 1 each by Other route as needed for other. Accu-chek, Disp: , Rfl:    HYDROcodone-acetaminophen (NORCO/VICODIN) 5-325 MG tablet, Take 1 tablet by mouth every 4 (four) hours as needed., Disp: 20 tablet, Rfl: 0   insulin glargine (LANTUS SOLOSTAR) 100 UNIT/ML Solostar Pen, Inject 14 Units into the skin at bedtime. PATIENT SELF ADMINISTERS, Disp: , Rfl:    insulin lispro (HUMALOG) 100 UNIT/ML KwikPen, Inject 0-10 Units into the skin 3 (three) times daily. PATIENT SELF ADMINISTERS, Disp: , Rfl:    Insulin Pen Needle (PEN NEEDLES 3/16") 31G X 5 MM MISC, 1 Device by Does not apply route 5 (five) times daily. With insulin administration, Disp: , Rfl:    latanoprost (XALATAN) 0.005 % ophthalmic solution, Place 1 drop into both eyes every evening., Disp: , Rfl:    lisinopril (ZESTRIL) 2.5 MG tablet, Take 2.5 mg by mouth in the morning., Disp: , Rfl:    Multiple Vitamins-Minerals (I-VITE PO), Take 1 tablet by mouth in the morning. [employer] Capsule, Disp: , Rfl:    nitroGLYCERIN (NITROSTAT) 0.4 MG SL tablet, Place 0.4 mg under the tongue every 5 (five) minutes x 3 doses as needed for chest pain., Disp: , Rfl:    Omega-3 Fatty Acids (FISH OIL) 1000 MG CAPS, Take 1,000 mg by mouth in the morning., Disp: , Rfl:    sennosides-docusate sodium (SENOKOT-S) 8.6-50 MG tablet, Take 1 tablet by mouth every other day. IN THE MORNING, Disp: ,  Rfl:    timolol (TIMOPTIC) 0.5 % ophthalmic solution, Place 1 drop into the left eye in the morning and at bedtime., Disp: , Rfl:    Review of Systems  Constitutional:  Negative for activity change, appetite change, chills, diaphoresis, fatigue, fever and unexpected weight change.  HENT:  Negative for congestion, rhinorrhea, sinus pressure, sneezing, sore throat and trouble swallowing.   Eyes:  Negative for photophobia and visual disturbance.  Respiratory:  Negative for cough, chest tightness, shortness of breath, wheezing and stridor.   Cardiovascular:  Negative for chest pain, palpitations and leg swelling.  Gastrointestinal:  Negative for abdominal distention, abdominal pain, anal bleeding, blood in stool, constipation, diarrhea, nausea and vomiting.  Genitourinary:  Negative for difficulty urinating, dysuria, flank pain and hematuria.  Musculoskeletal:  Negative for arthralgias, back pain, gait problem, joint swelling and myalgias.  Skin:  Positive for wound. Negative for color change, pallor and rash.  Neurological:  Negative for dizziness, tremors, weakness and light-headedness.  Hematological:  Negative for adenopathy. Does not bruise/bleed easily.  Psychiatric/Behavioral:  Negative for agitation, behavioral problems, confusion, decreased concentration, dysphoric mood and sleep disturbance.       Objective:   Physical Exam Constitutional:  General: She is not in acute distress.    Appearance: Normal appearance. She is well-developed. She is not ill-appearing or diaphoretic.  HENT:     Head: Normocephalic and atraumatic.     Right Ear: Hearing and external ear normal.     Left Ear: Hearing and external ear normal.     Nose: No nasal deformity or rhinorrhea.  Eyes:     General: No scleral icterus.    Conjunctiva/sclera: Conjunctivae normal.     Right eye: Right conjunctiva is not injected.     Left eye: Left conjunctiva is not injected.     Pupils: Pupils are equal, round,  and reactive to light.  Neck:     Vascular: No JVD.  Cardiovascular:     Rate and Rhythm: Normal rate and regular rhythm.     Heart sounds: Normal heart sounds, S1 normal and S2 normal. No murmur heard.   No friction rub.  Pulmonary:     Effort: Pulmonary effort is normal. No respiratory distress.     Breath sounds: No wheezing.  Abdominal:     General: Bowel sounds are normal. There is no distension.     Palpations: Abdomen is soft.     Tenderness: There is no abdominal tenderness.  Musculoskeletal:        General: Normal range of motion.     Right shoulder: Normal.     Left shoulder: Normal.     Cervical back: Normal range of motion and neck supple.     Right hip: Normal.     Left hip: Normal.     Right knee: Normal.     Left knee: Normal.  Lymphadenopathy:     Head:     Right side of head: No submandibular, preauricular or posterior auricular adenopathy.     Left side of head: No submandibular, preauricular or posterior auricular adenopathy.     Cervical: No cervical adenopathy.     Right cervical: No superficial or deep cervical adenopathy.    Left cervical: No superficial or deep cervical adenopathy.  Skin:    General: Skin is warm and dry.     Coloration: Skin is not pale.     Findings: No abrasion, bruising, ecchymosis, erythema, lesion or rash.     Nails: There is no clubbing.  Neurological:     General: No focal deficit present.     Mental Status: She is alert and oriented to person, place, and time.     Sensory: No sensory deficit.     Coordination: Coordination normal.     Gait: Gait normal.  Psychiatric:        Attention and Perception: She is attentive.        Mood and Affect: Mood normal.        Speech: Speech normal.        Behavior: Behavior normal. Behavior is cooperative.        Thought Content: Thought content normal.        Judgment: Judgment normal.     Right foot when seen by Dr. Gale Journey in November:        09/06/2021:           Assessment & Plan:   History of osteomyelitis status post 2 surgeries already with chronic wound that now is improved on antibiotics and with time.  I am bothered by her elevated sed rate as mentioned.  I will repeat sed rate CRP BMP and CBC with differential.  I am ordering an  MRI with and without contrast to evaluate this area keeping in mind she had recent surgery which could potentially confound some of the interpretation of the study.  I think she does need to see vascular surgery to make sure that her blood flow is evaluated and I think is also helpful that she has seen frequently despite the fact that this is frustrating for her to see somebody different providers.  I am scheduled her with Dr. Renold Don in January at his earliest available appointment.  Certainly I have counseled her should she have any clinical worsening she is to immediately let us know and we would need to resume the antibiotics that Dr. Renold Don had prescribe namely doxycycline and cefdinir sponsor that.  Vaccine counseling it looks like she would benefit from an updated COVID-19 booster will offer this to her.  I spent 42 minutes with the patient including than 50% of the time in face to face counseling of the patient re her hx of osteomyelitis, her nonhealing wound, personally reviewing Xrays, CRP BMP CBC with differential along with review of medical records in preparation for the visit and during the visit and in coordination of her care.

## 2021-09-07 LAB — CBC WITH DIFFERENTIAL/PLATELET
Absolute Monocytes: 667 cells/uL (ref 200–950)
Basophils Absolute: 50 cells/uL (ref 0–200)
Basophils Relative: 0.7 %
Eosinophils Absolute: 149 cells/uL (ref 15–500)
Eosinophils Relative: 2.1 %
HCT: 31.2 % — ABNORMAL LOW (ref 35.0–45.0)
Hemoglobin: 10.1 g/dL — ABNORMAL LOW (ref 11.7–15.5)
Lymphs Abs: 1839 cells/uL (ref 850–3900)
MCH: 29.4 pg (ref 27.0–33.0)
MCHC: 32.4 g/dL (ref 32.0–36.0)
MCV: 91 fL (ref 80.0–100.0)
MPV: 9.5 fL (ref 7.5–12.5)
Monocytes Relative: 9.4 %
Neutro Abs: 4395 cells/uL (ref 1500–7800)
Neutrophils Relative %: 61.9 %
Platelets: 357 10*3/uL (ref 140–400)
RBC: 3.43 10*6/uL — ABNORMAL LOW (ref 3.80–5.10)
RDW: 12.7 % (ref 11.0–15.0)
Total Lymphocyte: 25.9 %
WBC: 7.1 10*3/uL (ref 3.8–10.8)

## 2021-09-07 LAB — SEDIMENTATION RATE: Sed Rate: 58 mm/h — ABNORMAL HIGH (ref 0–30)

## 2021-09-07 LAB — BASIC METABOLIC PANEL WITH GFR
BUN: 25 mg/dL (ref 7–25)
CO2: 29 mmol/L (ref 20–32)
Calcium: 9.1 mg/dL (ref 8.6–10.4)
Chloride: 102 mmol/L (ref 98–110)
Creat: 0.85 mg/dL (ref 0.60–1.00)
Glucose, Bld: 261 mg/dL — ABNORMAL HIGH (ref 65–99)
Potassium: 4.6 mmol/L (ref 3.5–5.3)
Sodium: 138 mmol/L (ref 135–146)
eGFR: 73 mL/min/{1.73_m2} (ref >=60)

## 2021-09-07 LAB — C-REACTIVE PROTEIN: CRP: 6.7 mg/L (ref <8.0)

## 2021-09-09 ENCOUNTER — Telehealth: Payer: Self-pay

## 2021-09-09 NOTE — Telephone Encounter (Signed)
Patient is requesting her lab results from 09/06/21. Patient has been scheduled for the MRI that was ordered for her, but does not want to proceed with having the MRI done until she know her lab results. Please advise. Caitlin Sharp T Caitlin Sharp

## 2021-09-10 NOTE — Telephone Encounter (Signed)
I spoke with the patient and advised her of lab results. Patient would like to hold off on the getting the MRI at this time. Patient has a follow up with Dr. Lajoyce Corners coming up.

## 2021-09-13 ENCOUNTER — Encounter (HOSPITAL_COMMUNITY): Payer: Medicare Other

## 2021-09-13 ENCOUNTER — Encounter: Payer: Medicare Other | Admitting: Vascular Surgery

## 2021-09-18 ENCOUNTER — Ambulatory Visit (HOSPITAL_COMMUNITY)
Admission: RE | Admit: 2021-09-18 | Discharge: 2021-09-18 | Disposition: A | Discharge status: Home / Self Care | Payer: Medicare Other | Source: Ambulatory Visit | Attending: Infectious Disease | Admitting: Infectious Disease

## 2021-09-18 ENCOUNTER — Other Ambulatory Visit: Payer: Self-pay

## 2021-09-18 DIAGNOSIS — M869 Osteomyelitis, unspecified: Secondary | ICD-10-CM | POA: Insufficient documentation

## 2021-09-18 MED ORDER — GADOBUTROL 1 MMOL/ML IV SOLN
5.0000 mL | Freq: Once | INTRAVENOUS | Status: AC | PRN
  Administered 2021-09-18: 14:00:00 5 mL via INTRAVENOUS

## 2021-09-19 ENCOUNTER — Telehealth: Payer: Self-pay

## 2021-09-19 ENCOUNTER — Other Ambulatory Visit: Payer: Self-pay | Admitting: Internal Medicine

## 2021-09-19 ENCOUNTER — Ambulatory Visit (INDEPENDENT_AMBULATORY_CARE_PROVIDER_SITE_OTHER): Payer: Medicare Other | Admitting: Orthopedic Surgery

## 2021-09-19 ENCOUNTER — Telehealth: Payer: Self-pay | Admitting: Orthopedic Surgery

## 2021-09-19 DIAGNOSIS — Z89411 Acquired absence of right great toe: Secondary | ICD-10-CM

## 2021-09-19 DIAGNOSIS — S98111A Complete traumatic amputation of right great toe, initial encounter: Secondary | ICD-10-CM

## 2021-09-19 MED ORDER — DOXYCYCLINE HYCLATE 100 MG PO CAPS: 100.0000 mg | ORAL_CAPSULE | Freq: Two times a day (BID) | ORAL | 0 refills | Status: DC

## 2021-09-19 MED ORDER — CEFDINIR 300 MG PO CAPS: 300.0000 mg | ORAL_CAPSULE | Freq: Two times a day (BID) | ORAL | 0 refills | Status: DC

## 2021-09-19 NOTE — Telephone Encounter (Signed)
Patient made aware of communication between Dr. Lajoyce Corners and ID team. Patient verbalized understanding for the need of antibiotic therapy and confirms that she will pick this medication up today. Patient also informed of upcoming follow up visit. Patient verbalized understanding of keeping scheduling upcoming appointment. Caitlin Sharp

## 2021-09-19 NOTE — Telephone Encounter (Signed)
-----   Message from Kathlynn Grate, DO sent at 09/19/2021 12:49 PM EST ----- I am covering for Dr Daiva Eves who ordered MRI for patient.  She also sees Dr Renold Don for this issue of osteomyelitis and chronic wound.    MRI showed some concerning changes regarding infection.  I talked to Dr Lajoyce Corners who felt that wound was looking better today and recommended antibiotic therapy for now.  I have ordered Cefdinir and Doxy to her pharmacy.  She has follow up with Dr Renold Don already scheduled and it is important for her to keep this appointment.   Thanks

## 2021-09-19 NOTE — Telephone Encounter (Signed)
[facility] needs a copy of the prescription for an antibiotic that was prescribed for the patient and the prescription can be sent to [facility].  The copy of the script can be faxed to:  [phone]

## 2021-09-20 MED ORDER — CEFDINIR 300 MG PO CAPS: 300.0000 mg | ORAL_CAPSULE | Freq: Two times a day (BID) | ORAL | 0 refills | Status: AC

## 2021-09-20 MED ORDER — DOXYCYCLINE HYCLATE 100 MG PO CAPS: 100.0000 mg | ORAL_CAPSULE | Freq: Two times a day (BID) | ORAL | 0 refills | Status: AC

## 2021-09-20 NOTE — Progress Notes (Signed)
Received call from [facility] nurse stating Pharmacy did not have Rx. RN requesting hard script signed and faxed to [phone]. Printing new Rx to fax to [facility] team so patient can get medication today. Valarie Cones

## 2021-09-20 NOTE — Addendum Note (Signed)
Addended by: Valarie Cones on: 09/20/2021 10:45 AM   Modules accepted: Orders

## 2021-09-20 NOTE — Telephone Encounter (Signed)
Written by pt's pcp and looks like this was sent in today.

## 2021-09-24 ENCOUNTER — Encounter: Payer: Self-pay | Admitting: Orthopedic Surgery

## 2021-09-24 NOTE — Progress Notes (Signed)
Office Visit Note   Patient: Caitlin Sharp           Date of Birth: [birth year]           MRN: [id number] Visit Date: 09/19/2021              Requested by: [name], MD [address] [place],  [place] [address] PCP: [name], MD  Chief Complaint  Patient presents with   Right Foot - Routine Post Op    S/p right foot 1st ray amputation 07/19/21      HPI: Patient is 2 months status post right foot first ray amputation.  Currently using nitroglycerin patch and Trental with compression socks and protected weightbearing.  Assessment & Plan: Visit Diagnoses:  1. Amputation of right great toe (HCC)     Plan: Continue with compression nitroglycerin and Trental and protected weightbearing.  Follow-Up Instructions: Return in about 4 weeks (around 10/17/2021).   Ortho Exam  Patient is alert, oriented, no adenopathy, well-dressed, normal affect, normal respiratory effort. Examination patient has a wound that is 10 x 30 mm and 1 mm deep 25% has healthy granulation tissue.  Patient's arterial studies were good.  The wound shows improved healing.  Imaging: No results found. No images are attached to the encounter.  Labs: Lab Results  Component Value Date   HGBA1C 9.4 03/19/2021   ESRSEDRATE 58 (H) 09/06/2021   ESRSEDRATE 122 08/19/2021   CRP 6.7 09/06/2021   CRP 5.3 08/13/2021   REPTSTATUS 04/18/2021 FINAL 04/13/2021   REPTSTATUS 04/18/2021 FINAL 04/13/2021   CULT  04/13/2021    NO GROWTH 5 DAYS Performed at [facility], [address] [place], [place] [address]    CULT  04/13/2021    NO GROWTH 5 DAYS Performed at [facility], [address] [place], [place] [address]      Lab Results  Component Value Date   ALBUMIN 3.6 08/19/2021   ALBUMIN 3.8 04/13/2021   ALBUMIN 4.4 03/19/2021    No results found for: MG No results found for: VD25OH  No results found for: PREALBUMIN CBC EXTENDED Latest Ref Rng & Units 09/06/2021 08/19/2021  07/19/2021  WBC 3.8 - 10.8 Thousand/uL 7.1 7.6 8.0  RBC 3.80 - 5.10 Million/uL 3.43(L) 3.19(A) 3.83(L)  HGB 11.7 - 15.5 g/dL 10.1(L) 9.0(A) 11.5(L)  HCT 35.0 - 45.0 % 31.2(L) 28(A) 35.0(L)  PLT 140 - 400 Thousand/uL 357 447(A) 276  NEUTROABS 1,500 - 7,800 cells/uL 4,395 - -  LYMPHSABS 850 - 3,900 cells/uL 1,839 - -     There is no height or weight on file to calculate BMI.  Orders:  No orders of the defined types were placed in this encounter.  No orders of the defined types were placed in this encounter.    Procedures: No procedures performed  Clinical Data: No additional findings.  ROS:  All other systems negative, except as noted in the HPI. Review of Systems  Objective: Vital Signs: There were no vitals taken for this visit.  Specialty Comments:  No specialty comments available.  PMFS History: Patient Active Problem List   Diagnosis Date Noted   Osteomyelitis of great toe of right foot (HCC)    NSVT (nonsustained ventricular tachycardia) 01/17/2021   Acute deep vein thrombosis (DVT) of right peroneal vein (HCC) 10/17/2020   Insulin long-term use (HCC) 07/18/2020   Bruit of left carotid artery 10/31/2019   Coronary artery disease involving native coronary artery of native  heart without angina pectoris 10/31/2019   Open-angle glaucoma of right eye 10/31/2019   Dry eyes 03/01/2018   Presence of intraocular lens 10/18/2015   Cystoid macular edema 06/14/2012   Past Medical History:  Diagnosis Date   Abnormal thyroid blood test    [facility]Pearson   Acute deep vein thrombosis of right peroneal vein (HCC) 10/05/2020   RLE DVT   Anxiety    Arthritis    RA   Coronary artery disease    Diabetes type I [facility](HCC)    [facility]   Diabetic retinopathy (HCC)    [facility]   DKA (diabetic ketoacidosis) (HCC) 2021   Dr.William Zackowski.  Insulin Pump was out of Insulin   Glaucoma    Per [facility] New Patient  Packet   H/O heart bypass surgery 2012   Dr. Excell SeltzerBaker, [facility]   Heart murmur    when I was a teenager   History of blood transfusion    History of kidney stones    passed   Hx of CABG    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   Heart attack Father        [facility]   Diabetes Sister        [facility]   Emphysema Brother        [facility]   Heart disease Brother        [facility]    Past Surgical History:  Procedure Laterality Date   AMPUTATION Right 04/17/2021   Procedure: RIGHT GREAT TOE AMPUTATION;  Surgeon: Nadara Mustarduda, Calahan Pak V, MD;  [facility];  Service: Orthopedics;  Laterality: Right;   AMPUTATION Right 07/19/2021   Procedure: RIGHT FOOT 1ST RAY AMPUTATION;  Surgeon: Nadara Mustarduda, Japhet Morgenthaler V, MD;  [facility];  Service: Orthopedics;  Laterality: Right;   CESAREAN SECTION  1970   Dr. Aquilla HackerPaul Bower, [facility]   CESAREAN SECTION  1973   Dr. Aquilla [facility]ackerPaul Bower, [facility]   CORONARY ARTERY BYPASS GRAFT     Social History   Occupational History   Not on file  Tobacco Use   Smoking status: Never    Smokeless tobacco: Never  Vaping Use   Vaping Use: Never used  Substance and Sexual Activity   Alcohol use: Never   Drug use: Never   Sexual activity: Not on file

## 2021-09-26 NOTE — Progress Notes (Signed)
Office Note     CC:  Slow-healing right first toe ray amp Requesting Provider:  Mahlon GammonGupta, Anjali L, MD  HPI: Caitlin GuildSusan H Sharp is a 71 y.o. (1951-05-07) female presenting at the request of .Mahlon GammonGupta, Anjali L, MD for slow-helaing right first toe ray amputation performed on 07/19/21 By Dr. Aldean BakerMarcus Duda.   On exam today, was doing well.  Originally from [place], [place] several months ago the request of her son who lives in town.  Initially lived independently but had an episode of DKA that was stress related.  She is currently living in assisted living for nighttime blood glucose checks.  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  At that time, she is try to maintain healthy lifestyle counting carbs and exercising daily.  Her A1c has been atypically high recently due to lack of exercise.  The right foot wound has been present since October.  It has been very slow healing, which is concerning to Caitlin Sharp.  She denies recent fever chills.  The pt is on a statin for cholesterol management.  The pt is  on a daily aspirin.   Other AC:  plavix The pt is not on medication for hypertension.   The pt is  diabetic.  Tobacco hx:  -  Past Medical History:  Diagnosis Date   Abnormal thyroid blood test    [facility]Pearson   Acute deep vein thrombosis of right peroneal vein (HCC) 10/05/2020   RLE DVT   Anxiety    Arthritis    RA   Coronary artery disease    Diabetes type I [facility](HCC)    [facility]   Diabetic retinopathy (HCC)    [facility]   DKA (diabetic ketoacidosis) (HCC) 2021   Dr.William Zackowski.  Insulin Pump was out of Insulin   Glaucoma    Per [facility] New Patient Packet   H/O heart bypass surgery 2012   Dr. Excell SeltzerBaker, [facility]   Heart murmur    when I was a teenager   History of blood transfusion    History of kidney stones    passed   Hx of CABG    Per  [facility] Dr[facility]Pearson    Left bundle branch block    Mild mitral regurgitation    [facility] Dr[facility]Pear[facility]    Neuropathy 1976   [facility]   NSTEMI (non-ST elevated myocardial infarction) (HCC) 09/21/2020   NSTEMI/demand ischemic in setting of DKA, patent grafts by 09/24/20 [facility], continue medical therapy   Osteoporosis    T Score -2.7 , Per [facility] New Patient Packet   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 due to underlying condition [facility](HCC)    [facility] Dr[facility]Pearson    Past Surgical History:  Procedure Laterality Date   AMPUTATION Right 04/17/2021   Procedure: RIGHT GREAT TOE AMPUTATION;  Surgeon: Nadara Mustarduda, Marcus V, MD;  [facility];  Service: Orthopedics;  Laterality: Right;   AMPUTATION Right 07/19/2021   Procedure: RIGHT FOOT 1ST RAY AMPUTATION;  Surgeon: Nadara Mustarduda, Marcus V, MD;  [facility];  Service: Orthopedics;  Laterality: Right;   CESAREAN SECTION  1970   Dr. Aquilla HackerPaul Bower, [facility]   CESAREAN SECTION  1973   Dr. Aquilla HackerPaul Bower, [facility]  [facility]   CORONARY ARTERY BYPASS GRAFT      Social History   Socioeconomic History   Marital status: Widowed    Spouse name: Not on file   Number of children: Not on file   Years of education: Not on file   Highest education level: Not on file  Occupational History   Not on file  Tobacco Use   Smoking status: Never   Smokeless tobacco: Never  Vaping Use   Vaping Use: Never used  Substance and Sexual Activity   Alcohol use: Never   Drug use: Never   Sexual activity: Not on file  Other Topics Concern   Not on file  Social History Narrative   Per [facility] New Patient Packet Abstracted on 02/14/2021      Diet: Left blank       Caffeine: Yes      Married, if yes what year: Widowed, married in 1969      Do you live in a house, apartment, assisted living, condo, trailer, ect: Assisted Living        Is it one or more stories: 2      How many persons live in your home?One      Pets:No      Highest level or education completed:       Current/Past profession: Accountant       Exercise:        Yes          Type and how often: Walking 3-4 time weekly          Living Will: Yes   DNR: No   POA/HPOA: Yes      Social Determinants of Health   Financial Resource Strain: Not on file  Food Insecurity: Not on file  Transportation Needs: Not on file  Physical Activity: Not on file  Stress: Not on file  Social Connections: Not on file  Intimate Partner Violence: Not on file    Family History  Problem Relation Age of Onset   Congestive Heart Failure Mother        Per [facility] New Patient Packet   Arthritis Mother        Per [facility] New Patient Packet   Osteoporosis Mother        Per [facility] New Patient Packet   Heart attack Father        Per [facility] New Patient Packet   Diabetes Sister        Per [facility] Patient Packet   Emphysema Brother        Per [facility] New Patient Packet   Heart disease Brother        Per [facility] New Patient Packet    Current Outpatient Medications  Medication Sig Dispense Refill   Accu-Chek Softclix Lancets lancets USE 1 TO CHECK GLUCOSE THREE TIMES DAILY     aspirin EC 81 MG tablet Take 81 mg by mouth in the morning. Swallow whole.     atorvastatin (LIPITOR) 40 MG tablet Take 40 mg by mouth at bedtime.     Biotin w/ Vitamins C & E (HAIR/SKIN/NAILS PO) Take 1 tablet by mouth in the morning.     brimonidine (ALPHAGAN) 0.2 % ophthalmic solution Place 1 drop into the left eye 3 (three) times daily.     Calcium Carbonate (CALCIUM-CARB 600 PO) Take 600 mg by mouth in the morning.     cefdinir (OMNICEF) 300 MG capsule Take 1 capsule (300 mg total) by mouth 2 (two) times  daily for 14 days. 28 capsule 0   clopidogrel (PLAVIX) 75 MG tablet Take 75 mg by mouth in the morning.     doxycycline (VIBRAMYCIN) 100 MG capsule Take 1 capsule (100 mg total) by mouth 2 (two) times daily  for 14 days. 28 capsule 0   furosemide (LASIX) 20 MG tablet Take 20 mg by mouth in the morning.     glucosamine-chondroitin 500-400 MG tablet Take 1 tablet by mouth every morning.     glucose blood test strip 1 each by Other route as needed for other. Accu-chek     HYDROcodone-acetaminophen (NORCO/VICODIN) 5-325 MG tablet Take 1 tablet by mouth every 4 (four) hours as needed. 20 tablet 0   insulin glargine (LANTUS SOLOSTAR) 100 UNIT/ML Solostar Pen Inject 14 Units into the skin at bedtime. PATIENT SELF ADMINISTERS     insulin lispro (HUMALOG) 100 UNIT/ML KwikPen Inject 0-10 Units into the skin 3 (three) times daily. PATIENT SELF ADMINISTERS     Insulin Pen Needle (PEN NEEDLES 3/16") 31G X 5 MM MISC 1 Device by Does not apply route 5 (five) times daily. With insulin administration     latanoprost (XALATAN) 0.005 % ophthalmic solution Place 1 drop into both eyes every evening.     lisinopril (ZESTRIL) 2.5 MG tablet Take 2.5 mg by mouth in the morning.     Multiple Vitamins-Minerals (I-VITE PO) Take 1 tablet by mouth in the morning. [employer] Capsule     nitroGLYCERIN (NITROSTAT) 0.4 MG SL tablet Place 0.4 mg under the tongue every 5 (five) minutes x 3 doses as needed for chest pain.     Omega-3 Fatty Acids (FISH OIL) 1000 MG CAPS Take 1,000 mg by mouth in the morning.     sennosides-docusate sodium (SENOKOT-S) 8.6-50 MG tablet Take 1 tablet by mouth every other day. IN THE MORNING     timolol (TIMOPTIC) 0.5 % ophthalmic solution Place 1 drop into the left eye in the morning and at bedtime.     No current facility-administered medications for this visit.    Allergies  Allergen Reactions   Penicillins Anaphylaxis     REVIEW OF SYSTEMS:   [X]  denotes positive finding, [ ]  denotes negative finding Cardiac  Comments:  Chest pain or chest pressure:    Shortness of breath upon exertion:    Short of breath when lying flat:    Irregular heart rhythm:        Vascular    Pain in  calf, thigh, or hip brought on by ambulation:    Pain in feet at night that wakes you up from your sleep:     Blood clot in your veins:    Leg swelling:         Pulmonary    Oxygen at home:    Productive cough:     Wheezing:         Neurologic    Sudden weakness in arms or legs:     Sudden numbness in arms or legs:     Sudden onset of difficulty speaking or slurred speech:    Temporary loss of vision in one eye:     Problems with dizziness:         Gastrointestinal    Blood in stool:     Vomited blood:         Genitourinary    Burning when urinating:     Blood in urine:        Psychiatric  Major depression:         Hematologic    Bleeding problems:    Problems with blood clotting too easily:        Skin    Rashes or ulcers:        Constitutional    Fever or chills:      PHYSICAL EXAMINATION:  There were no vitals filed for this visit.  General:  WDWN in NAD; vital signs documented above Gait: Not observed HENT: WNL, normocephalic Pulmonary: normal non-labored breathing , without wheezing Cardiac: regular HR,  Abdomen: soft, NT, no masses Skin: without rashes Vascular Exam/Pulses:  Right Left  Radial 2+ (normal) 2+ (normal)  Ulnar 2+ (normal) 2+ (normal)  Femoral 2+ (normal) 2+ (normal)  Popliteal    DP    PT 2+ (normal) 2+ (normal)   Extremities: without ischemic changes, without Gangrene , without cellulitis; with open wounds;  Musculoskeletal: no muscle wasting or atrophy  Neurologic: A&O X 3;  No focal weakness or paresthesias are detected Psychiatric:  The pt has Normal affect.   Non-Invasive Vascular Imaging:   ABI Findings:  +---------+------------------+-----+--------------------------+------------  -+   Right     Rt Pressure (mmHg) Index Waveform                   Comment          +---------+------------------+-----+--------------------------+------------  -+   Brachial  159                                                                    +---------+------------------+-----+--------------------------+------------  -+   PTA       157                0.99  monophasic                                   +---------+------------------+-----+--------------------------+------------  -+   DP        255                1.60  monophasic                                   +---------+------------------+-----+--------------------------+------------  -+   Great Toe                          2nd digit waveform present GT  amputation   +---------+------------------+-----+--------------------------+------------  -+   +---------+------------------+-----+---------+-------+   Left      Lt Pressure (mmHg) Index Waveform  Comment   +---------+------------------+-----+---------+-------+   Brachial  159                                          +---------+------------------+-----+---------+-------+   PTA       255                1.60  triphasic           +---------+------------------+-----+---------+-------+   DP  255                1.60  biphasic            +---------+------------------+-----+---------+-------+   Great Toe 115                0.72                      +---------+------------------+-----+---------+-------+     ASSESSMENT/PLAN: Caitlin Sharp is a 71 y.o. female presenting with slowly healing right first toe ray amputation that was performed in October.  Patient's ABIs were reviewed demonstrating monophasic waveforms with likely falsely elevated ABIs from longstanding diabetes.  Physical exam was notable for palpable posterior tibial pulses bilaterally  The patient's nidus for poor wound healing is likely due to her longstanding diabetes (last A1C 10.3).  Longstanding, poorly controlled diabetes, leads to small vessel occlusion, which is usually not amenable to endovascular intervention.  Being that the patient has gone 3 months and is yet to heal her amputation site, I have offered right lower extremity angiogram to  define and hopefully improve right lower extremity blood flow. After discussing the risks and benefits of the above, Yissel elected to proceed.   Victorino Sparrow, MD [facility] [phone]

## 2021-09-27 ENCOUNTER — Ambulatory Visit: Payer: Medicare Other | Admitting: Vascular Surgery

## 2021-09-27 ENCOUNTER — Other Ambulatory Visit: Payer: Self-pay

## 2021-09-27 ENCOUNTER — Encounter: Payer: Self-pay | Admitting: Vascular Surgery

## 2021-09-27 ENCOUNTER — Encounter: Payer: Medicare Other | Admitting: Vascular Surgery

## 2021-09-27 ENCOUNTER — Encounter (HOSPITAL_COMMUNITY): Payer: Medicare Other

## 2021-09-27 ENCOUNTER — Ambulatory Visit (HOSPITAL_COMMUNITY)
Admission: RE | Admit: 2021-09-27 | Discharge: 2021-09-27 | Disposition: A | Discharge status: Home / Self Care | Payer: Medicare Other | Source: Ambulatory Visit | Attending: Vascular Surgery | Admitting: Vascular Surgery

## 2021-09-27 VITALS — BP 167/88 | HR 77 | Temp 97.9°F | Resp 20 | Ht 67.0 in | Wt 126.0 lb

## 2021-09-27 DIAGNOSIS — I739 Peripheral vascular disease, unspecified: Secondary | ICD-10-CM | POA: Diagnosis not present

## 2021-09-27 DIAGNOSIS — T148XXA Other injury of unspecified body region, initial encounter: Secondary | ICD-10-CM

## 2021-10-02 ENCOUNTER — Ambulatory Visit: Payer: Medicare Other | Admitting: Internal Medicine

## 2021-10-02 ENCOUNTER — Encounter (HOSPITAL_COMMUNITY)
Admission: RE | Disposition: A | Discharge status: Home / Self Care | Payer: Self-pay | Source: Home / Self Care | Attending: Vascular Surgery

## 2021-10-02 ENCOUNTER — Encounter (HOSPITAL_COMMUNITY): Payer: Self-pay | Admitting: Vascular Surgery

## 2021-10-02 ENCOUNTER — Other Ambulatory Visit: Payer: Self-pay

## 2021-10-02 ENCOUNTER — Ambulatory Visit (HOSPITAL_COMMUNITY)
Admission: RE | Admit: 2021-10-02 | Discharge: 2021-10-02 | Disposition: A | Discharge status: Home / Self Care | Payer: Medicare Other | Attending: Vascular Surgery | Admitting: Vascular Surgery

## 2021-10-02 DIAGNOSIS — E1051 Type 1 diabetes mellitus with diabetic peripheral angiopathy without gangrene: Secondary | ICD-10-CM | POA: Diagnosis present

## 2021-10-02 DIAGNOSIS — E10621 Type 1 diabetes mellitus with foot ulcer: Secondary | ICD-10-CM | POA: Diagnosis not present

## 2021-10-02 DIAGNOSIS — I10 Essential (primary) hypertension: Secondary | ICD-10-CM | POA: Insufficient documentation

## 2021-10-02 DIAGNOSIS — L97519 Non-pressure chronic ulcer of other part of right foot with unspecified severity: Secondary | ICD-10-CM | POA: Insufficient documentation

## 2021-10-02 DIAGNOSIS — Z7982 Long term (current) use of aspirin: Secondary | ICD-10-CM | POA: Diagnosis not present

## 2021-10-02 DIAGNOSIS — I70235 Atherosclerosis of native arteries of right leg with ulceration of other part of foot: Secondary | ICD-10-CM | POA: Diagnosis not present

## 2021-10-02 HISTORY — PX: ABDOMINAL AORTOGRAM W/LOWER EXTREMITY: CATH118223

## 2021-10-02 LAB — POCT I-STAT, CHEM 8
BUN: 42 mg/dL — ABNORMAL HIGH (ref 8–23)
Calcium, Ion: 1.25 mmol/L (ref 1.15–1.40)
Chloride: 98 mmol/L (ref 98–111)
Creatinine, Ser: 0.8 mg/dL (ref 0.44–1.00)
Glucose, Bld: 180 mg/dL — ABNORMAL HIGH (ref 70–99)
HCT: 32 % — ABNORMAL LOW (ref 36.0–46.0)
Hemoglobin: 10.9 g/dL — ABNORMAL LOW (ref 12.0–15.0)
Potassium: 4.3 mmol/L (ref 3.5–5.1)
Sodium: 135 mmol/L (ref 135–145)
TCO2: 27 mmol/L (ref 22–32)

## 2021-10-02 LAB — GLUCOSE, CAPILLARY
Glucose-Capillary: 164 mg/dL — ABNORMAL HIGH (ref 70–99)
Glucose-Capillary: 165 mg/dL — ABNORMAL HIGH (ref 70–99)

## 2021-10-02 SURGERY — ABDOMINAL AORTOGRAM W/LOWER EXTREMITY
Anesthesia: LOCAL

## 2021-10-02 MED ORDER — IODIXANOL 320 MG/ML IV SOLN
INTRAVENOUS | Status: DC | PRN
  Administered 2021-10-02: 105 mL via INTRA_ARTERIAL

## 2021-10-02 MED ORDER — SODIUM CHLORIDE 0.9 % IV SOLN: INTRAVENOUS | Status: DC

## 2021-10-02 MED ORDER — HYDRALAZINE HCL 20 MG/ML IJ SOLN: 5.0000 mg | INTRAMUSCULAR | Status: DC | PRN

## 2021-10-02 MED ORDER — MIDAZOLAM HCL 2 MG/2ML IJ SOLN
INTRAMUSCULAR | Status: DC | PRN
  Administered 2021-10-02: 1 mg via INTRAVENOUS

## 2021-10-02 MED ORDER — LIDOCAINE HCL (PF) 1 % IJ SOLN
INTRAMUSCULAR | Status: AC
  Filled 2021-10-02: qty 30

## 2021-10-02 MED ORDER — HEPARIN (PORCINE) IN NACL 1000-0.9 UT/500ML-% IV SOLN
INTRAVENOUS | Status: DC | PRN
  Administered 2021-10-02 (×2): 500 mL

## 2021-10-02 MED ORDER — LABETALOL HCL 5 MG/ML IV SOLN: 10.0000 mg | INTRAVENOUS | Status: DC | PRN

## 2021-10-02 MED ORDER — SODIUM CHLORIDE 0.9% FLUSH: 3.0000 mL | INTRAVENOUS | Status: DC | PRN

## 2021-10-02 MED ORDER — MIDAZOLAM HCL 2 MG/2ML IJ SOLN
INTRAMUSCULAR | Status: AC
  Filled 2021-10-02: qty 2

## 2021-10-02 MED ORDER — ONDANSETRON HCL 4 MG/2ML IJ SOLN: 4.0000 mg | Freq: Four times a day (QID) | INTRAMUSCULAR | Status: DC | PRN

## 2021-10-02 MED ORDER — ACETAMINOPHEN 325 MG PO TABS: 650.0000 mg | ORAL_TABLET | ORAL | Status: DC | PRN

## 2021-10-02 MED ORDER — HEPARIN (PORCINE) IN NACL 1000-0.9 UT/500ML-% IV SOLN
INTRAVENOUS | Status: AC
  Filled 2021-10-02: qty 500

## 2021-10-02 MED ORDER — FENTANYL CITRATE (PF) 100 MCG/2ML IJ SOLN
INTRAMUSCULAR | Status: DC | PRN
  Administered 2021-10-02: 50 ug via INTRAVENOUS

## 2021-10-02 MED ORDER — LIDOCAINE HCL (PF) 1 % IJ SOLN
INTRAMUSCULAR | Status: DC | PRN
  Administered 2021-10-02: 15 mL via INTRADERMAL

## 2021-10-02 MED ORDER — SODIUM CHLORIDE 0.9 % IV SOLN: 250.0000 mL | INTRAVENOUS | Status: DC | PRN

## 2021-10-02 MED ORDER — SODIUM CHLORIDE 0.9% FLUSH: 3.0000 mL | Freq: Two times a day (BID) | INTRAVENOUS | Status: DC

## 2021-10-02 MED ORDER — SODIUM CHLORIDE 0.9 % WEIGHT BASED INFUSION: 1.0000 mL/kg/h | INTRAVENOUS | Status: DC

## 2021-10-02 MED ORDER — FENTANYL CITRATE (PF) 100 MCG/2ML IJ SOLN
INTRAMUSCULAR | Status: AC
  Filled 2021-10-02: qty 2

## 2021-10-02 SURGICAL SUPPLY — 10 items
CATH OMNI FLUSH 5F 65CM (CATHETERS) ×1 IMPLANT
DEVICE CLOSURE MYNXGRIP 5F (Vascular Products) ×1 IMPLANT
KIT MICROPUNCTURE NIT STIFF (SHEATH) ×1 IMPLANT
KIT PV (KITS) ×2 IMPLANT
SHEATH PINNACLE 5F 10CM (SHEATH) ×1 IMPLANT
SHEATH PROBE COVER 6X72 (BAG) ×1 IMPLANT
SYR MEDRAD MARK V 150ML (SYRINGE) ×1 IMPLANT
TRANSDUCER W/STOPCOCK (MISCELLANEOUS) ×2 IMPLANT
TRAY PV CATH (CUSTOM PROCEDURE TRAY) ×2 IMPLANT
WIRE BENTSON .035X145CM (WIRE) ×1 IMPLANT

## 2021-10-02 NOTE — Op Note (Signed)
Patient name: Caitlin Sharp MRN: [id number] DOB: 1950/09/26 Sex: female  10/02/2021 Pre-operative Diagnosis: Nonhealing wounds on the right foot Post-operative diagnosis:  Same Surgeon:  Victorino Sparrow, MD Procedure Performed: 1.  Ultrasound-guided micropuncture access of the left common femoral artery 2.  Aortogram 3.  Bilateral lower extremity angiogram 4.  Third order cannulation, right lower extremity angiogram 5.  Device assisted closure-Mynx 6.  Moderate sedation time 37 minutes 7.  Contrast   Indications:  Caitlin Sharp is a 71 y.o. female presenting with slowly healing right first toe ray amputation that was performed in October. Patient's ABIs were reviewed demonstrating monophasic waveforms with likely falsely elevated ABIs from longstanding diabetes.  Physical exam was notable for palpable posterior tibial pulses bilaterally The patient's nidus for poor wound healing is likely due to her longstanding diabetes (last A1C 10.3).  Longstanding, poorly controlled diabetes, leads to small vessel occlusion, which is usually not amenable to endovascular intervention.  Being that the patient has gone 3 months and is yet to heal her amputation site, I have offered right lower extremity angiogram to define and hopefully improve right lower extremity blood flow. After discussing the risks and benefits of the above, [name] elected to proceed.  Findings:  Aortogram: Patent celiac trunk, patent superior mesenteric artery, bilateral renal arteries patent, normal common iliac, external iliac, internal iliac arteries bilaterally.  On the right: No flow-limiting stenosis in the common femoral, profunda, superficial femoral artery, popliteal artery.  Diffuse tibial disease with inline flow to the foot via the anterior tibial artery.  No flow-limiting stenosis appreciated in the anterior tibial artery.  Peroneal artery fills briskly giving off medial lateral perforators at the level of  the ankle.  There is a focal lesion present in the posterior tibial artery however there is brisk filling via collaterals immediately distal.  In the foot, the anterior tibial artery and posterior tibial arteries are patent.  Patent plantar arteries, patent plantar arch as well as plantar metatarsal arteries.  On the left: No flow-limiting stenosis appreciated in the common femoral, profunda, superficial femoral, popliteal arteries.  Three-vessel runoff to the foot.   Procedure:  The patient was identified in the holding area and taken to room 8.  The patient was then placed supine on the table and prepped and draped in the usual sterile fashion.  A time out was called.  Ultrasound was used to evaluate the left common femoral artery.  It was patent .  A digital ultrasound image was acquired.  A micropuncture needle was used to access the left common femoral artery under ultrasound guidance.  An 018 wire was advanced without resistance and a micropuncture sheath was placed.  The 018 wire was removed and a benson wire was placed.  The micropuncture sheath was exchanged for a 5 french sheath.  An omniflush catheter was advanced over the wire to the level of L-1.  An abdominal angiogram was obtained.  Next, using the omniflush catheter was brought to the aortic bifurcation and bilateral lower extremity angiogram followed.  The Omni Flush catheter was then advanced into the right superficial femoral artery for angiogram of the right foot.  See findings above.  The patient was closed using a minx device without issue.  Impression: Widely patent vasculature to the level of the knee.  Diffuse tibial disease with focal area of flow-limiting stenosis appreciated in the posterior tibial artery.  No flow-limiting stenosis appreciated in the anterior tibial artery or peroneal arteries, Excellent  filling of the foot through the digital arteries.  No intervention was undertaken. Patient would benefit from continued  aggressive wound care, limited adhesive, no compression.     Fara OldenJ. Eli Kenndra Morris, MD [facility] [place] Office: [phone]

## 2021-10-02 NOTE — H&P (Signed)
Office Note   Patient seen and examined in preop holding.  No complaints. No changes to medication history or physical exam since last seen in clinic. After discussing the risks and benefits of right leg angiogram for [place] 5 CLI, Caitlin Sharp elected to proceed.   Victorino Sparrow MD   CC:  Slow-healing right first toe ray amp Requesting Provider:  No ref. provider found  HPI: Caitlin Sharp is a 71 y.o. (April 16, 1951) female presenting at the request of .[name], MD for slow-helaing right first toe ray amputation performed on 07/19/21 By Dr. Aldean Baker.   On exam today, was doing well.  Originally from [place], she moved to [place] several months ago the request of her son who lives in town.  Initially lived independently but had an episode of DKA that was stress related.  She is currently living in assisted living for nighttime blood glucose checks.  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  At that time, she is try to maintain healthy lifestyle counting carbs and exercising daily.  Her A1c has been atypically high recently due to lack of exercise.  The right foot wound has been present since October.  It has been very slow healing, which is concerning to [place].  She denies recent fever chills.  The pt is on a statin for cholesterol management.  The pt is  on a daily aspirin.   Other AC:  plavix The pt is not on medication for hypertension.   The pt is  diabetic.  Tobacco hx:  -  Past Medical History:  Diagnosis Date   Abnormal thyroid blood test    Per Records recevied from [facility], Dr.Pearson   Acute deep vein thrombosis of right peroneal vein (HCC) 10/05/2020   RLE DVT   Anxiety    Arthritis    RA   Coronary artery disease    Diabetes type I [facility])    Per [facility] New Patient Packet   Diabetic retinopathy (HCC)    Per [facility] New Patient Packet   DKA (diabetic ketoacidosis) (HCC) 2021   Dr.William Zackowski.   Insulin Pump was out of Insulin   Glaucoma    Per [facility] New Patient Packet   H/O heart bypass surgery 2012   Dr. Excell Seltzer, Per [facility] New Patient Packet   Heart murmur    when I was a teenager   History of blood transfusion    History of kidney stones    passed   Hx of CABG    Per records received form [facility], Dr.Pearson    Left bundle branch block    Mild mitral regurgitation    Per records from [facility], Dr.Pearson    Neuropathy 1976   Per [facility] New Patient Packet   NSTEMI (non-ST elevated myocardial infarction) (HCC) 09/21/2020   NSTEMI/demand ischemic in setting of DKA, patent grafts by 09/24/20 [facility], continue medical therapy   Osteoporosis    T Score -2.7 , Per [facility] New Patient Packet   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 due to underlying condition [facility])    Per Records recevied from [facility], Dr.Pearson    Past Surgical History:  Procedure Laterality Date   AMPUTATION Right 04/17/2021   Procedure: RIGHT GREAT TOE AMPUTATION;  Surgeon: [name], MD;  Location: [facility] OR;  Service: Orthopedics;  Laterality: Right;   AMPUTATION Right 07/19/2021   Procedure: RIGHT FOOT 1ST  RAY AMPUTATION;  Surgeon: [name], MD;  Location: [facility] OR;  Service: Orthopedics;  Laterality: Right;   CESAREAN SECTION  1970   Dr. Aquilla Hacker, Per [facility] New Patient Packet   CESAREAN SECTION  1973   Dr. Aquilla Hacker, Per [facility] New Patient Packet   CORONARY ARTERY BYPASS GRAFT      Social History   Socioeconomic History   Marital status: Widowed    Spouse name: Not on file   Number of children: Not on file   Years of education: Not on file   Highest education level: Not on file  Occupational History   Not on file  Tobacco Use   Smoking status: Never   Smokeless tobacco: Never  Vaping Use   Vaping Use: Never used  Substance and Sexual Activity   Alcohol use: Never   Drug use: Never   Sexual activity: Not on file  Other Topics Concern   Not on  file  Social History Narrative   Per [facility] New Patient Packet Abstracted on 02/14/2021      Diet: Left blank       Caffeine: Yes      Married, if yes what year: Widowed, married in 1969      Do you live in a house, apartment, assisted living, condo, trailer, ect: Assisted Living       Is it one or more stories: 2      How many persons live in your home?One      Pets:No      Highest level or education completed:       Current/Past profession: Accountant       Exercise:        Yes          Type and how often: Walking 3-4 time weekly          Living Will: Yes   DNR: No   POA/HPOA: Yes      Social Determinants of Health   Financial Resource Strain: Not on file  Food Insecurity: Not on file  Transportation Needs: Not on file  Physical Activity: Not on file  Stress: Not on file  Social Connections: Not on file  Intimate Partner Violence: Not on file    Family History  Problem Relation Age of Onset   Congestive Heart Failure Mother        Per [facility] New Patient Packet   Arthritis Mother        Per [facility] New Patient Packet   Osteoporosis Mother        Per [facility] New Patient Packet   Heart attack Father        Per [facility] New Patient Packet   Diabetes Sister        Per [facility] New Patient Packet   Emphysema Brother        Per [facility] New Patient Packet   Heart disease Brother        Per [facility] New Patient Packet    Current Facility-Administered Medications  Medication Dose Route Frequency Provider Last Rate Last Admin   0.9 %  sodium chloride infusion   Intravenous Continuous Victorino Sparrow, MD 100 mL/hr at 10/02/21 0726 New Bag at 10/02/21 0726    Allergies  Allergen Reactions   Penicillins Anaphylaxis     REVIEW OF SYSTEMS:    denotes positive finding,  denotes negative finding Cardiac  Comments:  Chest pain or chest pressure:    Shortness of breath upon exertion:  Short of breath when lying flat:    Irregular heart rhythm:        Vascular    Pain in calf,  thigh, or hip brought on by ambulation:    Pain in feet at night that wakes you up from your sleep:     Blood clot in your veins:    Leg swelling:         Pulmonary    Oxygen at home:    Productive cough:     Wheezing:         Neurologic    Sudden weakness in arms or legs:     Sudden numbness in arms or legs:     Sudden onset of difficulty speaking or slurred speech:    Temporary loss of vision in one eye:     Problems with dizziness:         Gastrointestinal    Blood in stool:     Vomited blood:         Genitourinary    Burning when urinating:     Blood in urine:        Psychiatric    Major depression:         Hematologic    Bleeding problems:    Problems with blood clotting too easily:        Skin    Rashes or ulcers:        Constitutional    Fever or chills:      PHYSICAL EXAMINATION:  Vitals:   10/02/21 0648  BP: (!) 178/74  Pulse: 81  Resp: 18  Temp: 98.3 F (36.8 C)  TempSrc: Oral  SpO2: 100%  Weight: 58.1 kg  Height: 5\' 7"  (1.702 m)    General:  WDWN in NAD; vital signs documented above Gait: Not observed HENT: WNL, normocephalic Pulmonary: normal non-labored breathing , without wheezing Cardiac: regular HR,  Abdomen: soft, NT, no masses Skin: without rashes Vascular Exam/Pulses:  Right Left  Radial 2+ (normal) 2+ (normal)  Ulnar 2+ (normal) 2+ (normal)  Femoral 2+ (normal) 2+ (normal)  Popliteal    DP    PT 2+ (normal) 2+ (normal)   Extremities: without ischemic changes, without Gangrene , without cellulitis; with open wounds;  Musculoskeletal: no muscle wasting or atrophy  Neurologic: A&O X 3;  No focal weakness or paresthesias are detected Psychiatric:  The pt has Normal affect.   Non-Invasive Vascular Imaging:   ABI Findings:  +---------+------------------+-----+--------------------------+------------  -+   Right     Rt Pressure (mmHg) Index Waveform                   Comment           +---------+------------------+-----+--------------------------+------------  -+   Brachial  159                                                                   +---------+------------------+-----+--------------------------+------------  -+   PTA       157                0.99  monophasic                                   +---------+------------------+-----+--------------------------+------------  -+  DP        255                1.60  monophasic                                   +---------+------------------+-----+--------------------------+------------  -+   Great Toe                          2nd digit waveform present GT  amputation   +---------+------------------+-----+--------------------------+------------  -+   +---------+------------------+-----+---------+-------+   Left      Lt Pressure (mmHg) Index Waveform  Comment   +---------+------------------+-----+---------+-------+   Brachial  159                                          +---------+------------------+-----+---------+-------+   PTA       255                1.60  triphasic           +---------+------------------+-----+---------+-------+   DP        255                1.60  biphasic            +---------+------------------+-----+---------+-------+   Great Toe 115                0.72                      +---------+------------------+-----+---------+-------+     ASSESSMENT/PLAN: Caitlin Sharp is a 71 y.o. female presenting with slowly healing right first toe ray amputation that was performed in October.  Patient's ABIs were reviewed demonstrating monophasic waveforms with likely falsely elevated ABIs from longstanding diabetes.  Physical exam was notable for palpable posterior tibial pulses bilaterally  The patient's nidus for poor wound healing is likely due to her longstanding diabetes (last A1C 10.3).  Longstanding, poorly controlled diabetes, leads to small vessel occlusion, which is usually not amenable to  endovascular intervention.  Being that the patient has gone 3 months and is yet to heal her amputation site, I have offered right lower extremity angiogram to define and hopefully improve right lower extremity blood flow. After discussing the risks and benefits of the above, Caitlin Sharp elected to proceed.   Victorino Sparrow, MD [facility] [phone]

## 2021-10-07 ENCOUNTER — Encounter: Payer: Self-pay | Admitting: Internal Medicine

## 2021-10-07 ENCOUNTER — Non-Acute Institutional Stay (SKILLED_NURSING_FACILITY): Payer: Medicare Other | Admitting: Internal Medicine

## 2021-10-07 DIAGNOSIS — E1059 Type 1 diabetes mellitus with other circulatory complications: Secondary | ICD-10-CM | POA: Diagnosis not present

## 2021-10-07 DIAGNOSIS — M810 Age-related osteoporosis without current pathological fracture: Secondary | ICD-10-CM

## 2021-10-07 DIAGNOSIS — N898 Other specified noninflammatory disorders of vagina: Secondary | ICD-10-CM | POA: Diagnosis not present

## 2021-10-07 DIAGNOSIS — S81801S Unspecified open wound, right lower leg, sequela: Secondary | ICD-10-CM

## 2021-10-07 DIAGNOSIS — E785 Hyperlipidemia, unspecified: Secondary | ICD-10-CM | POA: Diagnosis not present

## 2021-10-07 DIAGNOSIS — I214 Non-ST elevation (NSTEMI) myocardial infarction: Secondary | ICD-10-CM

## 2021-10-07 NOTE — Progress Notes (Signed)
Location:   [facility] Room Number: [id number] Place of Service:  SNF [id number]) Provider:  Veleta Miners, MD  Caitlin Dad, MD  Patient Care Team: Caitlin Dad, MD as PCP - General (Internal Medicine) Caitlin Morelle, MD as Referring Physician (Ophthalmology) Caitlin Sharp, Caitlin Orem, MD as Referring Physician (Cardiology) Caitlin Grove, MD as Referring Physician (Cardiology)  Extended Emergency Contact Information Primary Emergency Contact: Caitlin Sharp Address: [address]          [place], [place] [address] Caitlin Sharp of [place] Phone: [phone] Mobile Phone: [phone] Relation: Son Preferred language: English Interpreter needed? No Secondary Emergency Contact: Caitlin Sharp Mobile Phone: [phone] Relation: Relative  Code Status:  DNR Goals of amputation: Advanced Directive information Advanced Directives 10/07/2021  Does Patient Have a Medical Advance Directive? Yes  Type of Advance Directive [place]  Does patient want to make changes to medical advance directive? No - Patient declined  Copy of [place] in Chart? Yes - validated most recent copy scanned in chart (See row information)  Would patient like information on creating a medical advance directive? -     Chief Complaint  Patient presents with   Acute Visit    Vaginal itching    HPI:  Pt is a 71 y.o. female seen today for an acute visit for Vaginal Itching Follow up of her Wound And Diabetes   /o Type 1 Diabetes For past 50 years. Has been on Pump but now Long Acting Insulin and Humalog now CAD S/o CABG in 2012 osteoporosis 2021 -2.7 Glaucoma with Poor Vision Also Was admitted in the hospital [place] in Jan of this year with DKA, NSTEMI, Enterococcus Bacteremia, Acute DVT Cath no Stenosis Medical Management EF 40% Elective Amputation of Right Foot 1St ray by Dr Caitlin Sharp on 10/28  Patient was diagnosed with Osteomyelitis  on 7/27 Underwent Right Great toe  amputation on 7/27  Vaginal Itching New issue Says she has Fungal infection inside Does not want me to do exam today Wants PO meds Type 1 diabetes Patient follows with endocrinology and [facility] A1c is above 10 She maintains her own insulin with her monitor and per nurses her sugars are high sometimes very low.  She refuses to have nurses manage it. Nonhealing wound Right foot after amputation from Dr. Sharol Sharp She was seen ID.  Cefdinir was added to her doxycycline.  Since then she made dramatic improvement.  They also made a referral to vascular surgery.  They did the arteriogram and did not find any acute stenosis Widely patent vasculature to the level of the knee.  Diffuse tibial disease with focal area of flow-limiting stenosis appreciated in the posterior tibial artery.  No flow-limiting stenosis . Patient's wound is doing much better now and is almost closed with a small area left but no redness at discharge. MCI Patient continues to show signs of cognitive impairment. Past Medical History:  Diagnosis Date   Abnormal thyroid blood test    Per Records recevied from [facility], Caitlin Sharp   Acute deep vein thrombosis of right peroneal vein ([place]) 10/05/2020   RLE DVT   Anxiety    Arthritis    RA   Coronary artery disease    Diabetes type I [facility])    Per [facility] New Patient Packet   Diabetic retinopathy ([place])    Per [facility] New Patient Packet   DKA (diabetic ketoacidosis) ([place]) 2021   Caitlin Sharp.  Insulin Pump was out of  Insulin   Glaucoma    Per [facility] New Patient Packet   H/O heart bypass surgery 2012   Dr. Excell Sharp, Per [facility] New Patient Packet   Heart murmur    when I was a teenager   History of blood transfusion    History of kidney stones    passed   Hx of CABG    Per records received form [facility], Caitlin Sharp    Left bundle branch block    Mild mitral regurgitation    Per records from [facility], Caitlin Sharp    Neuropathy 1976   Per  [facility] New Patient Packet   NSTEMI (non-ST elevated myocardial infarction) (HCC) 09/21/2020   NSTEMI/demand ischemic in setting of DKA, patent grafts by 09/24/20 [facility], continue medical therapy   Osteoporosis    T Score -2.7 , Per [facility] New Patient Packet   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 due to underlying condition [facility])    Per Records recevied from [facility], Caitlin Sharp   Past Surgical History:  Procedure Laterality Date   ABDOMINAL AORTOGRAM W/LOWER EXTREMITY N/A 10/02/2021   Procedure: ABDOMINAL AORTOGRAM W/LOWER EXTREMITY;  Surgeon: Victorino Sparrow, MD;  Location: [facility] INVASIVE CV LAB;  Service: Cardiovascular;  Laterality: N/A;   AMPUTATION Right 04/17/2021   Procedure: RIGHT GREAT TOE AMPUTATION;  Surgeon: [name], MD;  Location: [facility] OR;  Service: Orthopedics;  Laterality: Right;   AMPUTATION Right 07/19/2021   Procedure: RIGHT FOOT 1ST RAY AMPUTATION;  Surgeon: [name], MD;  Location: [facility] OR;  Service: Orthopedics;  Laterality: Right;   CESAREAN SECTION  1970   Dr. Aquilla Hacker, Per [facility] New Patient Packet   CESAREAN SECTION  1973   Dr. Aquilla Hacker, Per [facility] New Patient Packet   CORONARY ARTERY BYPASS GRAFT      Allergies  Allergen Reactions   Penicillins Anaphylaxis    Allergies as of 10/07/2021       Reactions   Penicillins Anaphylaxis        Medication List        Accurate as of October 07, 2021  3:57 PM. If you have any questions, ask your nurse or doctor.          STOP taking these medications    Accu-Chek Softclix Lancets lancets Stopped by: [name], MD   HYDROcodone-acetaminophen 5-325 MG tablet Commonly known as: NORCO/VICODIN Stopped by: [name], MD       TAKE these medications    acetaminophen 500 MG tablet Commonly known as: TYLENOL Take 1,000 mg by mouth in the morning and at bedtime.   aspirin EC 81 MG tablet Take 81 mg by mouth in the morning. Swallow whole.    atorvastatin 40 MG tablet Commonly known as: LIPITOR Take 40 mg by mouth at bedtime.   brimonidine 0.2 % ophthalmic solution Commonly known as: ALPHAGAN Place 1 drop into the left eye 3 (three) times daily.   CALCIUM-CARB 600 PO Take 600 mg by mouth in the morning.   clopidogrel 75 MG tablet Commonly known as: PLAVIX Take 75 mg by mouth in the morning.   diclofenac Sodium 1 % Gel Commonly known as: VOLTAREN Apply 1 application topically 3 (three) times daily as needed (applied to right leg when needed for pain).   Fish Oil 1000 MG Caps Take 1,000 mg by mouth in the morning.   fluconazole 100 MG tablet Commonly known as: DIFLUCAN Take 100 mg by mouth daily.  FreeStyle Libre 14 Day Sensor Misc Inject 1 sensor to the skin every 14 days for continuous glucose monitoring.   furosemide 20 MG tablet Commonly known as: LASIX Take 20 mg by mouth in the morning.   glucosamine-chondroitin 500-400 MG tablet Take 1 tablet by mouth every morning.   glucose blood test strip 1 each by Other route as needed for other. Accu-chek   HAIR/SKIN/NAILS PO Take 1 tablet by mouth in the morning.   I-VITE PO Take 1 tablet by mouth in the morning. True [employer] Eye [employer] Capsule   insulin lispro 100 UNIT/ML KwikPen Commonly known as: HUMALOG Inject 2-10 Units into the skin 3 (three) times daily. PATIENT SELF ADMINISTERS   Lantus SoloStar 100 UNIT/ML Solostar Pen Generic drug: insulin glargine Inject 14 Units into the skin at bedtime. PATIENT SELF ADMINISTERS   latanoprost 0.005 % ophthalmic solution Commonly known as: XALATAN Place 1 drop into both eyes every evening.   lisinopril 2.5 MG tablet Commonly known as: ZESTRIL Take 2.5 mg by mouth in the morning.   nitroGLYCERIN 0.4 MG SL tablet Commonly known as: NITROSTAT Place 0.4 mg under the tongue every 5 (five) minutes x 3 doses as needed for chest pain. What changed: Another medication with the same name was removed. Continue  taking this medication, and follow the directions you see here. Changed by: Caitlin Dad, MD   nitroGLYCERIN 0.2 mg/hr patch Commonly known as: NITRODUR - Dosed in mg/24 hr Place 0.2 mg onto the skin daily. Clean with DIAL soap & water, pat dry, APPLY Nitroglycerin Patch to dorsal foot in line with 2nd toe. Special Instructions: Right foot: Clean with DIAL soap & water, pat dry, APPLY Nitroglycerin Patch to dorsal foot in line with 2nd toe. What changed: Another medication with the same name was removed. Continue taking this medication, and follow the directions you see here. Changed by: Caitlin Dad, MD   Pen Needles 3/16" 31G X 5 MM Misc 1 Device by Does not apply route 5 (five) times daily. With insulin administration   sennosides-docusate sodium 8.6-50 MG tablet Commonly known as: SENOKOT-S Take 1 tablet by mouth every other day. IN THE MORNING   timolol 0.5 % ophthalmic solution Commonly known as: TIMOPTIC Place 1 drop into the left eye in the morning and at bedtime.        Review of Systems  Constitutional:  Negative for activity change and appetite change.  HENT: Negative.    Respiratory:  Negative for cough and shortness of breath.   Cardiovascular:  Positive for leg swelling.  Gastrointestinal:  Negative for constipation.  Genitourinary: Negative.   Musculoskeletal:  Negative for arthralgias, gait problem and myalgias.  Skin:  Positive for wound.  Neurological:  Negative for dizziness and weakness.  Psychiatric/Behavioral:  Positive for confusion. Negative for dysphoric mood and sleep disturbance.    Immunization History  Administered Date(s) Administered   Influenza, High Dose Seasonal PF 09/01/2005   PFIZER(Purple Top)SARS-COV-2 Vaccination 12/02/2019, 12/26/2019   Tdap 07/11/2014   Pertinent  Health Maintenance Due  Topic Date Due   COLONOSCOPY (Pts 45-35yrs Insurance coverage will need to be confirmed)  Never done   MAMMOGRAM  Never done   DEXA SCAN   Never done   INFLUENZA VACCINE  04/22/2021   Fall Risk 04/13/2021 04/17/2021 07/19/2021 08/20/2021 10/02/2021  Falls in the past year? - - - 0 -  Was there an injury with Fall? - - - 0 -  Fall Risk Category Calculator - - -  0 -  Fall Risk Category - - - Low -  Patient Fall Risk Level Low fall risk High fall risk Moderate fall risk Low fall risk High fall risk  Patient at Risk for Falls Due to - - - No Fall Risks -  Fall risk Follow up - - - Falls evaluation completed -   Functional Status Survey:    Vitals:   10/07/21 1523  BP: (!) 178/75  Pulse: 82  Resp: 18  Temp: (!) 97.4 F (36.3 C)  SpO2: 97%  Weight: 127 lb 6.4 oz (57.8 kg)  Height: 5\' 7"  (1.702 m)   Body mass index is 19.95 kg/m. Physical Exam Vitals reviewed.  Constitutional:      Appearance: Normal appearance.  HENT:     Head: Normocephalic.     Nose: Nose normal.     Mouth/Throat:     Mouth: Mucous membranes are moist.     Pharynx: Oropharynx is clear.  Eyes:     Pupils: Pupils are equal, round, and reactive to light.  Cardiovascular:     Rate and Rhythm: Normal rate and regular rhythm.     Pulses: Normal pulses.     Heart sounds: Normal heart sounds. No murmur heard. Pulmonary:     Effort: Pulmonary effort is normal.     Breath sounds: Normal breath sounds.  Abdominal:     General: Abdomen is flat. Bowel sounds are normal.     Palpations: Abdomen is soft.  Musculoskeletal:        General: No swelling.     Cervical back: Neck supple.  Skin:    General: Skin is warm.     Comments: Her wound in her Right Foot is Not red or swollen A small area is open but no discharge noticed.  Neurological:     General: No focal deficit present.     Mental Status: She is alert and oriented to person, place, and time.  Psychiatric:        Mood and Affect: Mood normal.        Thought Content: Thought content normal.    Labs reviewed: Recent Labs    07/19/21 1102 08/19/21 0000 09/06/21 0937 10/02/21 0725  NA  135 136* 138 135  K 4.2 4.7 4.6 4.3  CL 99 97* 102 98  CO2 27 28* 29  --   GLUCOSE 235*  --  261* 180*  BUN 28* 27* 25 42*  CREATININE 1.01* 0.9 0.85 0.80  CALCIUM 9.1 9.4 9.1  --    Recent Labs    03/19/21 0000 04/13/21 1825 08/19/21 0000  AST 29 19 16   ALT 24 9 11   ALKPHOS 99 79 144*  BILITOT  --  0.4  --   PROT  --  7.3  --   ALBUMIN 4.4 3.8 3.6   Recent Labs    04/13/21 1825 07/19/21 1102 08/19/21 0000 09/06/21 0937 10/02/21 0725  WBC 7.5 8.0 7.6 7.1  --   NEUTROABS 4.4  --   --  4,395  --   HGB 10.7* 11.5* 9.0* 10.1* 10.9*  HCT 32.0* 35.0* 28* 31.2* 32.0*  MCV 90.9 91.4  --  91.0  --   PLT 331 276 447* 357  --    Lab Results  Component Value Date   TSH 2.90 06/10/2021   Lab Results  Component Value Date   HGBA1C 9.4 03/19/2021   Lab Results  Component Value Date   CHOL 179 03/19/2021   HDL 83 (  A) 03/19/2021   LDLCALC 86 03/19/2021   TRIG 52 03/19/2021    Significant Diagnostic Results in last 30 days:  MR FOOT RIGHT W WO CONTRAST  Result Date: 09/18/2021 CLINICAL DATA:  Right foot swelling and reports status post 2 surgeries with chronic wound that is improved. History of osteomyelitis. EXAM: MRI OF THE RIGHT FOREFOOT WITHOUT AND WITH CONTRAST TECHNIQUE: Multiplanar, multisequence MR imaging of the right forefoot was performed before and after the administration of intravenous contrast. CONTRAST:  56mL GADAVIST GADOBUTROL 1 MMOL/ML IV SOLN COMPARISON:  X-ray foot 04/13/2021. FINDINGS: Bones/Joint/Cartilage Prior great toe and first metatarsal amputation. There is extensive bony edema within the medial cuneiform and throughout the second metatarsal. There is mildly low T1 signal within the medial cuneiform, base of the second metatarsal, and the second metatarsal head. There is associated bony enhancement. Additionally, there is intense marrow edema along the plantar aspect of the cuboid emanating from the peroneal sulcus, with associated linear low T1  signal. Additional patchy periarticular midfoot edema likely related to neuropathic arthropathy. Ligaments Intact Lisfranc ligament Muscles and Tendons Diffuse intramuscular edema and muscle atrophy in the foot as is commonly seen in diabetics. Postsurgical changes involving the first ray tendons. Other tendons are intact. Soft tissues Diffuse soft tissue swelling of the foot. There is a medial midfoot ulcer which extends to the surface of the proximal second metatarsal and the medial cuneiform. There is rim enhancing fluid along the course of the ulcer which is likely draining. There is an additional ulcer distally along the medial midfoot which appears open and is likely draining. IMPRESSION: Soft tissue ulcers along the medial midfoot, with underlying osteomyelitis of the second metatarsal and medial cuneiform. Rim enhancing fluid along the ulcers which appear to communicate with the skin surface, correlate with drainage. Additional intense marrow edema along the plantar aspect of the cuboid emanating from the peroneal sulcus with low signal intensity linear T1 signal, suggestive of cuboid stress fracture, however Sharp the above infectious findings would correlate with physical exam to be sure there is no lateral midfoot ulcer. Electronically Signed   By: Maurine Simmering M.D.   On: 09/18/2021 16:47   PERIPHERAL VASCULAR CATHETERIZATION  Result Date: 10/02/2021 Images from the original result were not included. Patient name: Caitlin Sharp MRN: [id number] DOB: 1950/11/01 Sex: female 10/02/2021 Pre-operative Diagnosis: Nonhealing wounds on the right foot Post-operative diagnosis:  Same Surgeon:  Broadus John, MD Procedure Performed: 1.  Ultrasound-guided micropuncture access of the left common femoral artery 2.  Aortogram 3.  Bilateral lower extremity angiogram 4.  Third order cannulation, right lower extremity angiogram 5.  Device assisted closure-Mynx 6.  Moderate sedation time 37 minutes 7.  Contrast 137ml  Indications:  Caitlin Sharp is a 71 y.o. female presenting with slowly healing right first toe ray amputation that was performed in October. Patient's ABIs were reviewed demonstrating monophasic waveforms with likely falsely elevated ABIs from longstanding diabetes.  Physical exam was notable for palpable posterior tibial pulses bilaterally The patient's nidus for poor wound healing is likely due to her longstanding diabetes (last A1C 10.3).  Longstanding, poorly controlled diabetes, leads to small vessel occlusion, which is usually not amenable to endovascular intervention.  Being that the patient has gone 3 months and is yet to heal her amputation site, I have offered right lower extremity angiogram to define and hopefully improve right lower extremity blood flow. After discussing the risks and benefits of the above, Francella elected to proceed. Findings: Aortogram:  Patent celiac trunk, patent superior mesenteric artery, bilateral renal arteries patent, normal common iliac, external iliac, internal iliac arteries bilaterally. On the right: No flow-limiting stenosis in the common femoral, profunda, superficial femoral artery, popliteal artery.  Diffuse tibial disease with inline flow to the foot via the anterior tibial artery.  No flow-limiting stenosis appreciated in the anterior tibial artery.  Peroneal artery fills briskly giving off medial lateral perforators at the level of the ankle.  There is a focal lesion present in the posterior tibial artery however there is brisk filling via collaterals immediately distal. In the foot, the anterior tibial artery and posterior tibial arteries are patent.  Patent plantar arteries, patent plantar arch as well as plantar metatarsal arteries. On the left: No flow-limiting stenosis appreciated in the common femoral, profunda, superficial femoral, popliteal arteries.  Three-vessel runoff to the foot.  Procedure:  The patient was identified in the holding area and taken to room  8.  The patient was then placed supine on the table and prepped and draped in the usual sterile fashion.  A time out was called.  Ultrasound was used to evaluate the left common femoral artery.  It was patent .  A digital ultrasound image was acquired.  A micropuncture needle was used to access the left common femoral artery under ultrasound guidance.  An 018 wire was advanced without resistance and a micropuncture sheath was placed.  The 018 wire was removed and a benson wire was placed.  The micropuncture sheath was exchanged for a 5 french sheath.  An omniflush catheter was advanced over the wire to the level of L-1.  An abdominal angiogram was obtained.  Next, using the omniflush catheter was brought to the aortic bifurcation and bilateral lower extremity angiogram followed.  The Omni Flush catheter was then advanced into the right superficial femoral artery for angiogram of the right foot.  See findings above. The patient was closed using a minx device without issue. Impression: Widely patent vasculature to the level of the knee.  Diffuse tibial disease with focal area of flow-limiting stenosis appreciated in the posterior tibial artery.  No flow-limiting stenosis appreciated in the anterior tibial artery or peroneal arteries, Excellent filling of the foot through the digital arteries. No intervention was undertaken. Patient would benefit from continued aggressive wound care, limited adhesive, no compression.  Cassandria Santee, MD [facility] Office: [phone]   VAS [place] ABI WITH/WO TBI  Result Date: 09/27/2021  LOWER EXTREMITY DOPPLER STUDY Patient Name:  Caitlin Sharp  Date of Exam:   09/27/2021 Medical Rec #: [id number]         Accession #:    [id number] Date of Birth: 1951/05/03         Patient Gender: F Patient Age:   29 years Exam Location:  [facility] Procedure:      VAS [place] ABI WITH/WO TBI Referring Phys: JOSHUA ROBINS  --------------------------------------------------------------------------------  Indications: Peripheral artery disease, and Non healing right ray amputation. High Risk Factors: Diabetes, no history of smoking.  Performing Technologist: Ralene Cork RVT  Examination Guidelines: A complete evaluation includes at minimum, Doppler waveform signals and systolic blood pressure reading at the level of bilateral brachial, anterior tibial, and posterior tibial arteries, when vessel segments are accessible. Bilateral testing is considered an integral part of a complete examination. Photoelectric Plethysmograph (PPG) waveforms and toe systolic pressure readings are included as required and additional duplex testing as needed. Limited examinations for reoccurring indications may  be performed as noted.  ABI Findings: +---------+------------------+-----+--------------------------+-------------+  Right     Rt Pressure (mmHg) Index Waveform                   Comment        +---------+------------------+-----+--------------------------+-------------+  Brachial  159                                                                +---------+------------------+-----+--------------------------+-------------+  PTA       157                0.99  monophasic                                +---------+------------------+-----+--------------------------+-------------+  DP        255                1.60  monophasic                                +---------+------------------+-----+--------------------------+-------------+  Great Toe                          2nd digit waveform present GT amputation  +---------+------------------+-----+--------------------------+-------------+ +---------+------------------+-----+---------+-------+  Left      Lt Pressure (mmHg) Index Waveform  Comment  +---------+------------------+-----+---------+-------+  Brachial  159                                          +---------+------------------+-----+---------+-------+  PTA       255                1.60  triphasic          +---------+------------------+-----+---------+-------+  DP        255                1.60  biphasic           +---------+------------------+-----+---------+-------+  Great Toe 115                0.72                     +---------+------------------+-----+---------+-------+ +-------+-----------+--------------------------------+------------+------------+  ABI/TBI Today's ABI Today's TBI                      Previous ABI Previous TBI  +-------+-----------+--------------------------------+------------+------------+  Right   [place]          GT amp 2nd digit waveform                                                        present                                                     +-------+-----------+--------------------------------+------------+------------+  Left    [place]          0.72                                                        +-------+-----------+--------------------------------+------------+------------+  No previous ABI.  Summary: Right: Resting right ankle-brachial index indicates noncompressible right lower extremity arteries. Left: Resting left ankle-brachial index indicates noncompressible left lower extremity arteries. The left toe-brachial index is normal.  *See table(s) above for measurements and observations.  Electronically signed by Orlie Pollen on 09/27/2021 at 6:40:13 PM.    Final     Assessment/Plan Vaginal itching Diflucan 100 mg QD for 3 days Type 1 diabetes mellitus with other circulatory complication (HCC) CBG varies from 100-400 Refuses to let Nurses manage it A1C More then 10 Follows with Endocrinology Non-healing wound of lower extremity, right, sequela Doing better Follows with Dr Caitlin Sharp Hyperlipidemia, unspecified hyperlipidemia type Continue Statin LDL 86 in 6/22 NSTEMI (non-ST elevated myocardial infarction) [facility]) In setting of her DKA Cath was negative for any  stenosis Just saw her Cardiologist On Dual Therpay till Jan 2023 per cardiology Age-related osteoporosis without current pathological fracture Will Need repeat DEXA in 2023.  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

## 2021-10-11 ENCOUNTER — Encounter: Payer: Self-pay | Admitting: Internal Medicine

## 2021-10-22 ENCOUNTER — Other Ambulatory Visit: Payer: Self-pay

## 2021-10-22 ENCOUNTER — Ambulatory Visit (INDEPENDENT_AMBULATORY_CARE_PROVIDER_SITE_OTHER): Payer: Medicare Other | Admitting: Orthopedic Surgery

## 2021-10-22 DIAGNOSIS — Z89411 Acquired absence of right great toe: Secondary | ICD-10-CM | POA: Diagnosis not present

## 2021-10-22 DIAGNOSIS — S98111A Complete traumatic amputation of right great toe, initial encounter: Secondary | ICD-10-CM

## 2021-10-23 ENCOUNTER — Encounter: Payer: Self-pay | Admitting: Orthopedic Surgery

## 2021-10-23 NOTE — Progress Notes (Signed)
Office Visit Note   Patient: Caitlin Sharp           Date of Birth: Oct 13, 1950           MRN: [id number] Visit Date: 10/22/2021              Requested by: [name], MD [address] [place],  [place] [address] PCP: [name], MD  Chief Complaint  Patient presents with   Right Foot - Follow-up    07/19/21 right foot 1st ray amputation      HPI: Patient is a 71 year old woman who presents in follow-up status post first ray amputation with slow healing of the surgical incision.  Patient has been using a nitroglycerin patch and Trental with compression stockings 15 to 20 mm of compression.  Patient's hemoglobin A1c most recently has been 10.3.  Patient did undergo arterial studies with Dr. Sherral Hammers.  Which showed diffuse tibial disease without flow-limiting stenosis.  Assessment & Plan: Visit Diagnoses:  1. Amputation of right great toe (HCC)     Plan: Patient has elected to stop using the topical nitroglycerin and oral Trental.  Patient is also elected not to use the 15 to 20 mmHg compression sock.  I will reevaluate in 4 weeks.  Follow-Up Instructions: Return in about 4 weeks (around 11/19/2021).   Ortho Exam  Patient is alert, oriented, no adenopathy, well-dressed, normal affect, normal respiratory effort. Examination patient's wound has shown improved healing there is no cellulitis no odor no drainage there is fibrinous tissue along the surgical wound.  Imaging: No results found. No images are attached to the encounter.  Labs: Lab Results  Component Value Date   HGBA1C 9.4 03/19/2021   ESRSEDRATE 58 (H) 09/06/2021   ESRSEDRATE 122 08/19/2021   CRP 6.7 09/06/2021   CRP 5.3 08/13/2021   REPTSTATUS 04/18/2021 FINAL 04/13/2021   REPTSTATUS 04/18/2021 FINAL 04/13/2021   CULT  04/13/2021    NO GROWTH 5 DAYS Performed at [facility], [address], [place], [place] [address]    CULT  04/13/2021    NO GROWTH 5 DAYS Performed at [facility], [address] [place], [place] [address]      Lab Results  Component Value Date   ALBUMIN 3.6 08/19/2021   ALBUMIN 3.8 04/13/2021   ALBUMIN 4.4 03/19/2021    No results found for: MG No results found for: VD25OH  No results found for: PREALBUMIN CBC EXTENDED Latest Ref Rng & Units 10/02/2021 09/06/2021 08/19/2021  WBC 3.8 - 10.8 Thousand/uL - 7.1 7.6  RBC 3.80 - 5.10 Million/uL - 3.43(L) 3.19(A)  HGB 12.0 - 15.0 g/dL 10.9(L) 10.1(L) 9.0(A)  HCT 36.0 - 46.0 % 32.0(L) 31.2(L) 28(A)  PLT 140 - 400 Thousand/uL - 357 447(A)  NEUTROABS 1,500 - 7,800 cells/uL - 4,395 -  LYMPHSABS 850 - 3,900 cells/uL - 1,839 -     There is no height or weight on file to calculate BMI.  Orders:  No orders of the defined types were placed in this encounter.  No orders of the defined types were placed in this encounter.    Procedures: No procedures performed  Clinical Data: No additional findings.  ROS:  All other systems negative, except as noted in the HPI. Review of Systems  Objective: Vital Signs: There were no vitals taken for this visit.  Specialty Comments:  No specialty comments available.  PMFS History: Patient Active Problem List   Diagnosis Date Noted  Osteomyelitis of great toe of right foot (HCC)    NSVT (nonsustained ventricular tachycardia) 01/17/2021   Acute deep vein thrombosis (DVT) of right peroneal vein (HCC) 10/17/2020   Insulin long-term use (HCC) 07/18/2020   Bruit of left carotid artery 10/31/2019   Coronary artery disease involving native coronary artery of native heart without angina pectoris 10/31/2019   Open-angle glaucoma of right eye 10/31/2019   Dry eyes 03/01/2018   Presence of intraocular lens 10/18/2015   Cystoid macular edema 06/14/2012   Past Medical History:  Diagnosis Date   Abnormal thyroid blood test    [facility]   Acute deep vein thrombosis of right peroneal vein (HCC) 10/05/2020    RLE DVT   Anxiety    Arthritis    RA   Coronary artery disease    Diabetes type I [facility](HCC)    [facility]   Diabetic retinopathy (HCC)    [facility]   DKA (diabetic ketoacidosis) (HCC) 2021   Dr.William Zackowski.  Insulin Pump was out of Insulin   Glaucoma    Per [facility] Patient Packet   H/O heart bypass surgery 2012   Dr. Excell SeltzerBaker, [facility]   Heart murmur    when I was a teenager   History of blood transfusion    History of kidney stones    passed   Hx of CABG    [facility] Dr[facility]    Left bundle branch block    Mild mitral regurgitation    [facility]    Neuropathy 1976   [facility]   NSTEMI (non-ST elevated myocardial infarction) (HCC) 09/21/2020   NSTEMI/demand ischemic in setting of DKA, patent grafts by 09/24/20 [facility], continue medical therapy   Osteoporosis    T Score -2.7 , Per [facility] New Patient Packet   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 due to underlying condition (HCC)    [facility]    Family History  Problem Relation Age of Onset   Congestive Heart Failure Mother        [facility]   Arthritis Mother        [facility]   Osteoporosis Mother        [facility]   Heart attack Father        [facility]   Diabetes Sister        [facility]   Emphysema Brother        [facility]   Heart disease Brother        [facility]    Past Surgical History:  Procedure Laterality Date   ABDOMINAL AORTOGRAM W/LOWER EXTREMITY N/A 10/02/2021   Procedure: ABDOMINAL AORTOGRAM W/LOWER EXTREMITY;  Surgeon: Victorino Sparrowobins, Joshua E, MD;  [facility];  Service: Cardiovascular;  Laterality: N/A;   AMPUTATION Right 04/17/2021   Procedure: RIGHT GREAT TOE AMPUTATION;   Surgeon: Nadara Mustarduda, Jorene Kaylor V, MD;  [facility];  Service: Orthopedics;  Laterality: Right;   AMPUTATION Right 07/19/2021   Procedure: RIGHT FOOT 1ST RAY AMPUTATION;  Surgeon: Nadara Mustarduda, Jatziry Wechter V, MD;  [facility];  Service: Orthopedics;  Laterality: Right;   CESAREAN SECTION  1970   Dr. Aquilla HackerPaul Bower,  Per [facility] New Patient Packet   CESAREAN SECTION  1973   Dr. Aquilla Hacker, Per [facility] New Patient Packet   CORONARY ARTERY BYPASS GRAFT     Social History   Occupational History   Not on file  Tobacco Use   Smoking status: Never   Smokeless tobacco: Never  Vaping Use   Vaping Use: Never used  Substance and Sexual Activity   Alcohol use: Never   Drug use: Never   Sexual activity: Not on file

## 2021-10-25 ENCOUNTER — Ambulatory Visit: Payer: Medicare Other | Admitting: Internal Medicine

## 2021-10-27 ENCOUNTER — Encounter: Payer: Self-pay | Admitting: Infectious Disease

## 2021-10-27 DIAGNOSIS — I739 Peripheral vascular disease, unspecified: Secondary | ICD-10-CM

## 2021-10-27 HISTORY — DX: Peripheral vascular disease, unspecified: I73.9

## 2021-10-27 NOTE — Progress Notes (Signed)
Subjective:  Chief complaint: Diabetic foot infection with osteomyelitis   [name], female    DOB: 15-May-1951, 71 y.o.   MRN: [id number]  HPI  Caitlin Sharp is a 73 -year-old Caucasian female with multiple medical problems including coronary artery disease, diabetes mellitus requiring insulin with recent osteomyelitis involving the right hallux who had amputation with disarticulation of the MTP joint apparently on July 27.  In his did not heal completely and had dehiscence decided to do that on October 28 and had a more proximal first ray amputation.  Since then wound is still has not completely healed and there is some additional dehiscence.  Dr Sharol Given had ordered ABI and placed nitroglycerin patch.  When she was seen by my partner Dr. Gale Journey on August 20, 2021.  He had suspicions that this process was related to vascular ischemia.  He placed her on  cefdinir and doxycycline for a 10-day course.  He made referral to vascular surgery which is coming up in December 23.    I have reviewed labs and I was  bothered by the fact that her sedimentation rate was 129 and we do not have an explanation for this besides the concern that she could have still residual deep infection i.e. osteomyelitis.  Apparently plain [facility] done at [facility] did not show osteomyelitis.  I ordered MRI of the foot which was performd on 12/ /2022:  This is read as having shown " osteomyelitis of the second metatarsal and medial cuneiform. Rim enhancing fluid along the ulcers which appear to communicate with the skin surface, correlate with drainage.   Additional intense marrow edema along the plantar aspect of the cuboid emanating from the peroneal sulcus with low signal intensity linear T1 signal, suggestive of cuboid stress fracture, however given the above infectious findings would correlate with physical exam to be sure there is no lateral midfoot ulcer."  Dr Juleen China my partner was covering and  discussed with Dr. Sharol Given and further doxycycline and cefdinir were prescribed.  She has seen [facility] \\and  apparently the intervention of removing a sock that she is wearing dramatically has improved her blood flow and her wound healing according to the patient.  In the interim she also underwent angiography pefromed. She has continued to follow with Dr. Sharol Given.  She completed the antibiotics that Dr. Juleen China had prescribed but again she noted that dramatic improvement not with antibiotic initiation but with removal of her sock.    She is currently without any systemic symptoms of infection such as fever chills malaise weight loss nausea.  She does not have much in the way of foot pain at this point in time.  Inflammatory markers have actually gone down over time.          Past Medical History:  Diagnosis Date   Abnormal thyroid blood test    Per [facility], Dr.Pearson   Acute deep vein thrombosis of right peroneal vein ([place]) 10/05/2020   RLE DVT   Anxiety    Arthritis    RA   Coronary artery disease    Diabetes type I [facility])    Per [facility] New Patient Packet   Diabetic retinopathy ([place])    Per [facility] New Patient Packet   DKA (diabetic ketoacidosis) ([place]) 2021   Dr.William Zackowski.  Insulin Pump was out of Insulin   Glaucoma    Per [place] Patient Packet   H/O heart bypass surgery 2012   Dr. Luana Shu, Per [place]  Patient Packet   Heart murmur    when I was a teenager   History of blood transfusion    History of kidney stones    passed   Hx of CABG    Per [facility], Dr.Pearson    Left bundle branch block    Mild mitral regurgitation    Per [facility] [place]    Neuropathy 1976   Per [place] Patient Packet   NSTEMI (non-ST elevated myocardial infarction) ([place]) 09/21/2020   NSTEMI/demand ischemic in setting of DKA, patent grafts by 09/24/20 [facility], continue medical therapy   Osteoporosis    T Score -2.7 , Per [place] New  Patient Packet   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 due to underlying condition [facility])    Per Records recevied [facility] [place]    Past Surgical History:  Procedure Laterality Date   ABDOMINAL AORTOGRAM W/LOWER EXTREMITY N/A 10/02/2021   Procedure: ABDOMINAL AORTOGRAM W/LOWER EXTREMITY;  Surgeon: Broadus John, MD;  Location: [place] CV LAB;  Service: Cardiovascular;  Laterality: N/A;   AMPUTATION Right 04/17/2021   Procedure: RIGHT GREAT TOE AMPUTATION;  Surgeon: Newt Minion, MD;  Location: [place];  Service: Orthopedics;  Laterality: Right;   AMPUTATION Right 07/19/2021   Procedure: RIGHT FOOT 1ST RAY AMPUTATION;  Surgeon: Newt Minion, MD;  Location: [place];  Service: Orthopedics;  Laterality: Right;   CESAREAN SECTION  1970   Dr. Moses Manners, Per [facility] New Patient Packet   CESAREAN SECTION  1973   Dr. Moses Manners, Per [facility] New Patient Packet   CORONARY ARTERY BYPASS GRAFT      Family History  Problem Relation Age of Onset   Congestive Heart Failure Mother        Per [facility] New Patient Packet   Arthritis Mother        Per [place] Patient Packet   Osteoporosis Mother        Per [facility] New Patient Packet   Heart attack Father        Per [facility] New Patient Packet   Diabetes Sister        Per [facility] New Patient Packet   Emphysema Brother        Per [facility] New Patient Packet   Heart disease Brother        Per [facility] New Patient Packet      Social History   Socioeconomic History   Marital status: Widowed    Spouse name: Not on file   Number of children: Not on file   Years of education: Not on file   Highest education level: Not on file  Occupational History   Not on file  Tobacco Use   Smoking status: Never   Smokeless tobacco: Never  Vaping Use   Vaping Use: Never used  Substance and Sexual Activity   Alcohol use: Never   Drug use: Never   Sexual activity: Not on file  Other Topics Concern   Not on file  Social  History Narrative   Per [place] Patient Packet Abstracted on 02/14/2021      Diet: Left blank       Caffeine: Yes      Married, if yes what year: Widowed, married in [place] you live in a house, apartment, assisted living, condo, trailer, ect: Assisted Living       Is it one or more stories:  2      How many persons live in your home?One      Pets:No      Highest level or education completed:       Current/Past profession: Accountant       Exercise:        Yes          Type and how often: Walking 3-4 time weekly          Living Will: Yes   DNR: No   POA/HPOA: Yes      Social Determinants of Corporate investment bankerHealth   Financial Resource Strain: Not on file  Food Insecurity: Not on file  Transportation Needs: Not on file  Physical Activity: Not on file  Stress: Not on file  Social Connections: Not on file    Allergies  Allergen Reactions   Penicillins Anaphylaxis     Current Outpatient Medications:    acetaminophen (TYLENOL) 500 MG tablet, Take 1,000 mg by mouth in the morning and at bedtime., Disp: , Rfl:    aspirin EC 81 MG tablet, Take 81 mg by mouth in the morning. Swallow whole., Disp: , Rfl:    atorvastatin (LIPITOR) 40 MG tablet, Take 40 mg by mouth at bedtime., Disp: , Rfl:    Biotin w/ Vitamins C & E (HAIR/SKIN/NAILS PO), Take 1 tablet by mouth in the morning., Disp: , Rfl:    brimonidine (ALPHAGAN) 0.2 % ophthalmic solution, Place 1 drop into the left eye 3 (three) times daily., Disp: , Rfl:    Calcium Carbonate (CALCIUM-CARB 600 PO), Take 600 mg by mouth in the morning., Disp: , Rfl:    clopidogrel (PLAVIX) 75 MG tablet, Take 75 mg by mouth in the morning., Disp: , Rfl:    Continuous Blood Gluc Sensor (FREESTYLE LIBRE 14 DAY SENSOR) MISC, Inject 1 sensor to the skin every 14 days for continuous glucose monitoring., Disp: , Rfl:    diclofenac Sodium (VOLTAREN) 1 % GEL, Apply 1 application topically 3 (three) times daily as needed (applied to right leg when needed for  pain)., Disp: , Rfl:    furosemide (LASIX) 20 MG tablet, Take 20 mg by mouth in the morning., Disp: , Rfl:    glucosamine-chondroitin 500-400 MG tablet, Take 1 tablet by mouth every morning., Disp: , Rfl:    glucose blood test strip, 1 each by Other route as needed for other. Accu-chek, Disp: , Rfl:    insulin glargine (LANTUS SOLOSTAR) 100 UNIT/ML Solostar Pen, Inject 14 Units into the skin at bedtime. PATIENT SELF ADMINISTERS, Disp: , Rfl:    insulin lispro (HUMALOG) 100 UNIT/ML KwikPen, Inject 2-10 Units into the skin 3 (three) times daily. PATIENT SELF ADMINISTERS, Disp: , Rfl:    Insulin Pen Needle (PEN NEEDLES 3/16") 31G X 5 MM MISC, 1 Device by Does not apply route 5 (five) times daily. With insulin administration, Disp: , Rfl:    latanoprost (XALATAN) 0.005 % ophthalmic solution, Place 1 drop into both eyes every evening., Disp: , Rfl:    lisinopril (ZESTRIL) 2.5 MG tablet, Take 2.5 mg by mouth in the morning., Disp: , Rfl:    Multiple Vitamins-Minerals (I-VITE PO), Take 1 tablet by mouth in the morning. [employer] Capsule, Disp: , Rfl:    nitroGLYCERIN (NITRODUR - DOSED IN MG/24 HR) 0.2 mg/hr patch, Place 0.2 mg onto the skin daily. Clean with DIAL soap & water, pat dry, APPLY Nitroglycerin Patch to dorsal foot in line with 2nd toe. Special Instructions: Right foot:  Clean with DIAL soap & water, pat dry, APPLY Nitroglycerin Patch to dorsal foot in line with 2nd toe., Disp: , Rfl:    nitroGLYCERIN (NITROSTAT) 0.4 MG SL tablet, Place 0.4 mg under the tongue every 5 (five) minutes x 3 doses as needed for chest pain., Disp: , Rfl:    Omega-3 Fatty Acids (FISH OIL) 1000 MG CAPS, Take 1,000 mg by mouth in the morning., Disp: , Rfl:    sennosides-docusate sodium (SENOKOT-S) 8.6-50 MG tablet, Take 1 tablet by mouth every other day. IN THE MORNING, Disp: , Rfl:    timolol (TIMOPTIC) 0.5 % ophthalmic solution, Place 1 drop into the left eye in the morning and at bedtime., Disp: , Rfl:     Review of Systems  Constitutional:  Negative for chills and fever.  HENT:  Negative for congestion and sore throat.   Eyes:  Negative for photophobia.  Respiratory:  Negative for cough, shortness of breath and wheezing.   Cardiovascular:  Negative for chest pain, palpitations and leg swelling.  Gastrointestinal:  Negative for abdominal pain, blood in stool, constipation, diarrhea, nausea and vomiting.  Genitourinary:  Negative for dysuria, flank pain and hematuria.  Musculoskeletal:  Negative for back pain and myalgias.  Skin:  Positive for wound. Negative for rash.  Neurological:  Negative for dizziness, weakness and headaches.  Hematological:  Does not bruise/bleed easily.  Psychiatric/Behavioral:  Negative for suicidal ideas.       Objective:   Physical Exam Constitutional:      General: She is not in acute distress.    Appearance: She is not diaphoretic.  HENT:     Head: Normocephalic and atraumatic.     Right Ear: External ear normal.     Left Ear: External ear normal.     Nose: Nose normal.     Mouth/Throat:     Pharynx: No oropharyngeal exudate.  Eyes:     General: No scleral icterus.       Right eye: No discharge.        Left eye: No discharge.     Extraocular Movements: Extraocular movements intact.     Conjunctiva/sclera: Conjunctivae normal.  Cardiovascular:     Rate and Rhythm: Normal rate and regular rhythm.     Heart sounds:    No friction rub.  Pulmonary:     Effort: Pulmonary effort is normal. No respiratory distress.     Breath sounds: No wheezing or rales.  Abdominal:     General: There is no distension.     Palpations: Abdomen is soft.  Musculoskeletal:        General: No tenderness. Normal range of motion.     Cervical back: Normal range of motion and neck supple.  Lymphadenopathy:     Cervical: No cervical adenopathy.  Skin:    General: Skin is warm and dry.     Coloration: Skin is not jaundiced or pale.     Findings: No erythema, lesion  or rash.  Neurological:     General: No focal deficit present.     Mental Status: She is alert and oriented to person, place, and time.     Coordination: Coordination normal.  Psychiatric:        Mood and Affect: Mood normal.        Behavior: Behavior normal.        Thought Content: Thought content normal.        Judgment: Judgment normal.     Right foot when seen by Dr.  Vu in November:        09/06/2021:      Foot today 10/28/2021:        Assessment & Plan:  Hx of osteomyelitis sp  post 2 surgeries already with chronic wound t MRI done in late December shows continued osteomyelitis of 2nd metatarsal and medial cunieform with rimi enhancing fluid collectiong communicating with ulcer and ? Possible cuboid stress fracture vs osteomyelitis  Clinically she does not have evidence of active infection but the radiographic findings do still disturb me.  I worry that eventually the infection of the sites will manifest itself with worsening pain and progression of bone infection.  She is certainly at this point I am not at all interested in any types of intervention such as surgeries but I see no reason for intervention with antibiotics either since I would only want to do that if I am going to either cure an infection or at least temporize it.  We will therefore check sed rate CRP CMP CBC differential.  I will plan on seeing her back in 2 months time.  She is to call us if there is any clinical worsening in the interim.  Diabetes mellitus: She is trying to work on her diabetes and says that her A1c had gotten up to 10 in part because of lack of exercise.  Peripheral vascular disease status post angiography and followed by vascular.  Vaccine counseling and recommended Prevnar 20 but she said I do not do vaccines.   Vaccine counseling; recommended Prevnar 20  I spent 41 minutes with the patient including than 50% of the time in face to face counseling of the patient  regarding her diabetic foot ulcer osteomyelitis, peripheral vascular disease personally reviewing MRI of the foot from late December along with updated cultures inflammatory markers serologies and along with review of medical records in preparation for the visit and during the visit and in coordination of her care.

## 2021-10-28 ENCOUNTER — Other Ambulatory Visit: Payer: Self-pay

## 2021-10-28 ENCOUNTER — Ambulatory Visit (INDEPENDENT_AMBULATORY_CARE_PROVIDER_SITE_OTHER): Payer: Medicare Other | Admitting: Infectious Disease

## 2021-10-28 ENCOUNTER — Encounter: Payer: Self-pay | Admitting: Infectious Disease

## 2021-10-28 VITALS — BP 136/73 | HR 78 | Temp 97.9°F | Resp 16 | Ht 67.0 in | Wt 128.2 lb

## 2021-10-28 DIAGNOSIS — I2510 Atherosclerotic heart disease of native coronary artery without angina pectoris: Secondary | ICD-10-CM | POA: Diagnosis not present

## 2021-10-28 DIAGNOSIS — E1065 Type 1 diabetes mellitus with hyperglycemia: Secondary | ICD-10-CM | POA: Diagnosis not present

## 2021-10-28 DIAGNOSIS — M869 Osteomyelitis, unspecified: Secondary | ICD-10-CM

## 2021-10-28 DIAGNOSIS — I739 Peripheral vascular disease, unspecified: Secondary | ICD-10-CM

## 2021-10-28 HISTORY — DX: Type 1 diabetes mellitus with hyperglycemia: E10.65

## 2021-10-29 LAB — CBC WITH DIFFERENTIAL/PLATELET
Absolute Monocytes: 650 cells/uL (ref 200–950)
Basophils Absolute: 33 cells/uL (ref 0–200)
Basophils Relative: 0.5 %
Eosinophils Absolute: 150 cells/uL (ref 15–500)
Eosinophils Relative: 2.3 %
HCT: 32.3 % — ABNORMAL LOW (ref 35.0–45.0)
Hemoglobin: 10.4 g/dL — ABNORMAL LOW (ref 11.7–15.5)
Lymphs Abs: 2106 cells/uL (ref 850–3900)
MCH: 28.6 pg (ref 27.0–33.0)
MCHC: 32.2 g/dL (ref 32.0–36.0)
MCV: 88.7 fL (ref 80.0–100.0)
MPV: 9.8 fL (ref 7.5–12.5)
Monocytes Relative: 10 %
Neutro Abs: 3562 cells/uL (ref 1500–7800)
Neutrophils Relative %: 54.8 %
Platelets: 286 10*3/uL (ref 140–400)
RBC: 3.64 10*6/uL — ABNORMAL LOW (ref 3.80–5.10)
RDW: 13.7 % (ref 11.0–15.0)
Total Lymphocyte: 32.4 %
WBC: 6.5 10*3/uL (ref 3.8–10.8)

## 2021-10-29 LAB — COMPLETE METABOLIC PANEL WITH GFR
AG Ratio: 1.3 (calc) (ref 1.0–2.5)
ALT: 17 U/L (ref 6–29)
AST: 19 U/L (ref 10–35)
Albumin: 3.9 g/dL (ref 3.6–5.1)
Alkaline phosphatase (APISO): 106 U/L (ref 37–153)
BUN: 24 mg/dL (ref 7–25)
CO2: 32 mmol/L (ref 20–32)
Calcium: 9.8 mg/dL (ref 8.6–10.4)
Chloride: 100 mmol/L (ref 98–110)
Creat: 0.99 mg/dL (ref 0.60–1.00)
Globulin: 3 g/dL (calc) (ref 1.9–3.7)
Glucose, Bld: 249 mg/dL — ABNORMAL HIGH (ref 65–99)
Potassium: 4.2 mmol/L (ref 3.5–5.3)
Sodium: 138 mmol/L (ref 135–146)
Total Bilirubin: 0.4 mg/dL (ref 0.2–1.2)
Total Protein: 6.9 g/dL (ref 6.1–8.1)
eGFR: 61 mL/min/{1.73_m2} (ref >=60)

## 2021-10-29 LAB — C-REACTIVE PROTEIN: CRP: 2.2 mg/L (ref <8.0)

## 2021-10-29 LAB — SEDIMENTATION RATE: Sed Rate: 29 mm/h (ref 0–30)

## 2021-10-31 ENCOUNTER — Telehealth: Payer: Self-pay

## 2021-10-31 NOTE — Telephone Encounter (Signed)
Patient called requesting lab results.     Chalyn Amescua Lesli Albee, CMA

## 2021-10-31 NOTE — Telephone Encounter (Signed)
Patient aware of results.   Caitlin Sharp P Pricella Gaugh, CMA  

## 2021-10-31 NOTE — Telephone Encounter (Signed)
Left voice mail asking patient to return my call.

## 2021-12-09 ENCOUNTER — Non-Acute Institutional Stay: Payer: Medicare Other | Admitting: Adult Health

## 2021-12-09 ENCOUNTER — Encounter: Payer: Self-pay | Admitting: Adult Health

## 2021-12-09 DIAGNOSIS — E1065 Type 1 diabetes mellitus with hyperglycemia: Secondary | ICD-10-CM | POA: Diagnosis not present

## 2021-12-09 DIAGNOSIS — L03115 Cellulitis of right lower limb: Secondary | ICD-10-CM | POA: Diagnosis not present

## 2021-12-09 NOTE — Progress Notes (Signed)
?Location:  [place] ?  ?Place of Service:  ALF (13) ?Provider:   ?Cindi Carbon, ANP ?[place] ?[phone] ? ? ?[name], MD ? ?Patient Care Team: ?[name], MD as PCP - General (Internal Medicine) ?San Morelle, MD as Referring Physician (Ophthalmology) ?Cheek, Gayleen Orem, MD as Referring Physician (Cardiology) ?Alyssa Grove, MD as Referring Physician (Cardiology) ? ?Extended Emergency Contact Information ?Primary Emergency Contact: Esguerra,Nathan ?Address: [address] ?         [place], [place] [address] [place] ?Home Phone: [phone] ?Mobile Phone: [phone] ?Relation: Son ?Preferred language: English ?Interpreter needed? No ?Secondary Emergency Contact: [name],[name] ?Mobile Phone: [phone] ?Relation: Relative ? ?Code Status:   ?Goals of care: Advanced Directive information ?Advanced Directives 10/07/2021  ?Does Patient Have a Medical Advance Directive? Yes  ?Type of Advance Directive Healthcare Power of Attorney  ?Does patient want to make changes to medical advance directive? No - Patient declined  ?Copy of [place] in Chart? Yes - validated most recent copy scanned in chart (See row information)  ?Would patient like information on creating a medical advance directive? -  ? ? ? ?Chief Complaint  ?Patient presents with  ? Acute Visit  ?  F/u foot infection  ? ? ?HPI:  ?Pt is a 71 y.o. Sharp seen today for an acute visit for f/u regarding a foot infection. 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 [place] oncall was notified and doxycycline started for 10 days on 12/08/21.  ?Type 1 DM for years with prior hx of insulin pump A1C 10.3 ?CBGS have been elevated in the 300-400 range. She is  on lantus and meal coverage followed by endocrinology.  ?Reports that she has been stressed due to family issues.  ?Past Medical History:  ?Diagnosis Date  ? Abnormal thyroid blood test   ? Per Records recevied from [facility], [place]  ? Acute deep vein thrombosis of right peroneal vein ([place]) 10/05/2020  ? RLE DVT  ? Anxiety   ? Arthritis   ? RA  ? Coronary artery disease   ? Diabetes type I ([place])   ? Per [facility] New Patient Packet  ? Diabetic retinopathy ([place])   ? Per [facility] New Patient Packet  ? DKA (diabetic ketoacidosis) ([place]) 2021  ? Dr.William Zackowski.  Insulin Pump was out of Insulin  ? Glaucoma   ? Per [facility] New Patient Packet  ? H/O heart bypass surgery 2012  ? Dr. Luana Shu, Per [facility] New Patient Packet  ? Heart murmur   ? when I was a teenager  ? History of blood transfusion   ? History of kidney stones   ? passed  ? Hx of CABG   ? Per records received form [facility], Dr.Pearson   ? Left bundle branch block   ? Mild mitral regurgitation   ? Per records from [facility], [place]   ? Neuropathy 1976  ? Per [facility] New Patient Packet  ? NSTEMI (non-ST elevated myocardial infarction) ([place]) 09/21/2020  ? NSTEMI/demand ischemic in setting of DKA, patent grafts by 09/24/20 [facility], continue medical therapy  ? Osteoporosis   ? T Score -2.7 , Per [facility] New Patient Packet  ? 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 Per Records recevied from [facility], [place]  ? PVD (peripheral vascular disease) ([place]) 10/27/2021  ? Type 1 diabetes mellitus with hyperglycemia ([place]) 10/28/2021  ? ?Past Surgical History:  ?Procedure Laterality Date  ? ABDOMINAL AORTOGRAM W/LOWER EXTREMITY N/A 10/02/2021  ? Procedure: ABDOMINAL AORTOGRAM W/LOWER EXTREMITY;  Surgeon: Broadus John, MD;  Location: [place] CV LAB;  Service: Cardiovascular;  Laterality: N/A;  ? AMPUTATION Right 04/17/2021  ? Procedure: RIGHT GREAT TOE AMPUTATION;  Surgeon: Newt Minion, MD;  Location:  [place];  Service: Orthopedics;  Laterality: Right;  ? AMPUTATION Right 07/19/2021  ? Procedure: RIGHT FOOT 1ST RAY AMPUTATION;  Surgeon: Newt Minion, MD;  Location: [place];  Service: Orthopedics;  Laterality: Right;  ? [place]  ? Dr. Moses Manners, Per [facility] New Patient Packet  ? [place]  ? Dr. Moses Manners, Per [facility] New Patient Packet  ? CORONARY ARTERY BYPASS GRAFT    ? ? ?Allergies  ?Allergen Reactions  ? Penicillins Anaphylaxis  ? ? ?Outpatient Encounter Medications as of 12/09/2021  ?Medication Sig  ? doxycycline (DORYX) 100 MG EC tablet Take 100 mg by mouth 2 (two) times daily.  ? saccharomyces boulardii (FLORASTOR) 250 MG capsule Take 250 mg by mouth 2 (two) times daily.  ? acetaminophen (TYLENOL) 500 MG tablet Take 1,000 mg by mouth in the morning and at bedtime.  ? aspirin EC 81 MG tablet Take 81 mg by mouth in the morning. Swallow whole.  ? atorvastatin (LIPITOR) 40 MG tablet Take 40 mg by mouth at bedtime.  ? Biotin w/ Vitamins C & E (HAIR/SKIN/NAILS PO) Take 1 tablet by mouth in the morning.  ? brimonidine (ALPHAGAN) 0.2 % ophthalmic solution Place 1 drop into the left eye 3 (three) times daily.  ? Calcium Carbonate (CALCIUM-CARB 600 PO) Take 600 mg by mouth in the morning.  ? clopidogrel (PLAVIX) 75 MG tablet Take 75 mg by mouth in the morning.  ? Continuous Blood Gluc Sensor (FREESTYLE LIBRE 14 DAY SENSOR) MISC Inject 1 sensor to the skin every 14 days for continuous glucose monitoring.  ? diclofenac Sodium (VOLTAREN) 1 % GEL Apply 1 application topically 3 (three) times daily as needed (applied to right leg when needed for pain).  ? furosemide (LASIX) 20 MG tablet Take 20 mg by mouth in the morning.  ? glucosamine-chondroitin 500-400 MG tablet Take 1 tablet by mouth every morning.  ? glucose blood test strip 1 each by Other route as needed for other. Accu-chek  ? insulin glargine (LANTUS SOLOSTAR) 100 UNIT/ML Solostar Pen Inject 14 Units into the skin at bedtime. PATIENT SELF  ADMINISTERS  ? insulin lispro (HUMALOG) 100 UNIT/ML KwikPen Inject 2-10 Units into the skin 3 (three) times daily. PATIENT SELF ADMINISTERS  ? Insulin Pen Needle (PEN NEEDLES 3/16") 31G X 5 MM MISC 1 Device by Does not apply route 5 (five) times daily. With insulin administration  ? latanoprost (XALATAN) 0.005 % ophthalmic solution Place 1 drop into both eyes every evening.  ? lisinopril (ZESTRIL) 2.5 MG tablet Take 2.5 mg by mouth in the morning.  ? Multiple Vitamins-Minerals (I-VITE PO) Take 1 tablet by mouth in the morning. [employer] Eye Health Capsule  ? nitroGLYCERIN (NITROSTAT) 0.4 MG SL tablet Place 0.4 mg under the tongue every 5 (five) minutes x 3 doses as needed for chest pain.  ? Omega-3 Fatty Acids (FISH OIL) 1000 MG CAPS Take 1,000 mg by mouth in  the morning.  ? sennosides-docusate sodium (SENOKOT-S) 8.6-50 MG tablet Take 1 tablet by mouth every other day. IN THE MORNING  ? timolol (TIMOPTIC) 0.5 % ophthalmic solution Place 1 drop into the left eye in the morning and at bedtime.  ? [DISCONTINUED] nitroGLYCERIN (NITRODUR - DOSED IN MG/24 HR) 0.2 mg/hr patch Place 0.2 mg onto the skin daily. Clean with DIAL soap & water, pat dry, APPLY Nitroglycerin Patch to dorsal foot in line with 2nd toe. ?Special Instructions: Right foot: Clean with DIAL soap & water, pat dry, APPLY Nitroglycerin Patch to dorsal foot in line with 2nd toe.  ? ?No facility-administered encounter medications on file as of 12/09/2021.  ? ? ?Review of Systems  ?Constitutional:  Negative for activity change, appetite change, chills, diaphoresis, fatigue, fever and unexpected weight change.  ?Respiratory:  Negative for cough and shortness of breath.   ?Gastrointestinal:  Negative for abdominal distention, constipation, diarrhea and nausea.  ?Musculoskeletal:  Negative for back pain and gait problem.  ?Skin:  Positive for color change and wound.  ?Psychiatric/Behavioral:  Negative for confusion. The patient is nervous/anxious.    ? ?Immunization History  ?Administered Date(s) Administered  ? Influenza, High Dose Seasonal PF 09/01/2005  ? PFIZER(Purple Top)SARS-COV-2 Vaccination 12/02/2019, 12/26/2019  ? Tdap 07/11/2014  ? ?Pertinent  Health M

## 2021-12-16 ENCOUNTER — Encounter: Payer: Self-pay | Admitting: Infectious Disease

## 2021-12-16 ENCOUNTER — Ambulatory Visit (INDEPENDENT_AMBULATORY_CARE_PROVIDER_SITE_OTHER): Payer: Medicare Other | Admitting: Infectious Disease

## 2021-12-16 ENCOUNTER — Other Ambulatory Visit: Payer: Self-pay

## 2021-12-16 VITALS — BP 149/72 | HR 68 | Temp 98.1°F | Ht 67.0 in | Wt 130.0 lb

## 2021-12-16 DIAGNOSIS — I82451 Acute embolism and thrombosis of right peroneal vein: Secondary | ICD-10-CM

## 2021-12-16 DIAGNOSIS — M869 Osteomyelitis, unspecified: Secondary | ICD-10-CM | POA: Diagnosis not present

## 2021-12-16 DIAGNOSIS — I739 Peripheral vascular disease, unspecified: Secondary | ICD-10-CM | POA: Diagnosis not present

## 2021-12-16 DIAGNOSIS — E1065 Type 1 diabetes mellitus with hyperglycemia: Secondary | ICD-10-CM | POA: Diagnosis not present

## 2021-12-16 NOTE — Progress Notes (Addendum)
? ?Subjective:  ?Chief complaint: Recurrent redness in her left foot and some new pain more with bearing weight and more "like a blister is present" ? ? [name], female    DOB: 02/08/1951, 71 y.o.   MRN: [id number] ? ?HPI ? 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  In his did not heal completely and had dehiscence decided to do that on October 28 and had a more proximal first ray amputation.  Since then wound is still has not completely healed and there is some additional dehiscence. ? ?Dr Lajoyce Corners had ordered ABI and placed nitroglycerin patch. ? ?When she was seen by my partner Dr. Renold Don on November 29, 71 2022. ? ?He had suspicions that this process was related to vascular ischemia. ? ?He placed her on  cefdinir and doxycycline for a 10-day course. ? ?He made referral to vascular surgery which is coming up in December 23.   ? ?I have reviewed labs and I was  bothered by the fact that her sedimentation rate was 129 and we do not have an explanation for this besides the concern that she could have still residual deep infection i.e. osteomyelitis.  Apparently plain films done at [facility] did not show osteomyelitis. ? ?I ordered MRI of the foot which was performd on 12/ /2022: ? ?This is read as having shown " osteomyelitis of the second metatarsal and medial cuneiform. Rim ?enhancing fluid along the ulcers which appear to communicate with ?the skin surface, correlate with drainage. ?  ?Additional intense marrow edema along the plantar aspect of the ?cuboid emanating from the peroneal sulcus with low signal intensity ?linear T1 signal, suggestive of cuboid stress fracture, however ?given the above infectious findings would correlate with physical ?exam to be sure there is  no lateral midfoot ulcer." ? ?Dr Earlene Plater my partner was covering and discussed with Dr. Lajoyce Corners and further doxycycline and cefdinir were prescribed. ? ?She saw [facility] \\and  apparently the intervention of removing a sock that she is wearing dramatically had improved her blood flow and her wound healing according to the patient.  In the interim she also underwent angiography pefromed. She has continued to follow with Dr. Lajoyce Corners. ? ?She completed the antibiotics that Dr. Earlene Plater had prescribed but again she noted that dramatic improvement not with antibiotic initiation but with removal of her sock. ? ?When I last saw her she was not on antibiotics and not having much pain and having no systemic symptoms. ? ? ?Since then she has been doing well but recently had acute flaring of erythema in this foot and she set up and to the ankle. ? ?She was started on doxycycline which she is finishing today.  She has had improvement while on doxycycline with application of topical agents to the wound. ? ?I am concerned that this is a declaration of her underlying osteomyelitis that we will need a surgical intervention to affect cure. ? ?She is seeing PCP tomorrow and will get A1c there. ? ? ? ? ? ? ? ? ? ?Past Medical History:  ?Diagnosis Date  ? Abnormal thyroid blood test   ? Per [facility] recevied [facility], Dr.Pearson  ? Acute deep vein thrombosis of right peroneal vein ([facility]) 10/05/2020  ? RLE DVT  ? Anxiety   ? Arthritis   ?  RA  ? Coronary artery disease   ? Diabetes type I ([facility])   ? [facility]  ? Diabetic retinopathy ([facility])   ? [facility]  ? DKA (diabetic ketoacidosis) ([facility]) 2021  ? Dr.William Zackowski.  Insulin Pump was out of Insulin  ? Glaucoma   ? [facility]  ? H/O heart bypass surgery 2012  ? Dr. Excell SeltzerBaker, [facility]  ? Heart murmur   ? when I was a teenager  ? History of blood transfusion   ? History of kidney stones   ? passed  ? Hx of CABG   ? [facility]   ? Left bundle branch block   ? Mild mitral regurgitation   ? [facility]   ? Neuropathy 1976  ? [facility]  ? NSTEMI (non-ST elevated myocardial infarction) (HCC) 09/21/2020  ? NSTEMI/demand ischemic in setting of DKA, patent grafts by 09/24/20 [facility], continue medical therapy  ? Osteoporosis   ? T Score -2.7 , [facility]  ? 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 [facility]  ? PVD (peripheral vascular disease) (HCC) 10/27/2021  ? Type 1 diabetes mellitus with hyperglycemia (HCC) 10/28/2021  ? ? ?Past Surgical History:  ?Procedure Laterality Date  ? ABDOMINAL AORTOGRAM W/LOWER EXTREMITY N/A 10/02/2021  ? Procedure: ABDOMINAL AORTOGRAM W/LOWER EXTREMITY;  Surgeon: Victorino Sparrowobins, Joshua E, MD;  [facility];  Service: Cardiovascular;  Laterality: N/A;  ? AMPUTATION Right 04/17/2021  ? Procedure: RIGHT GREAT TOE AMPUTATION;  Surgeon: Nadara Mustarduda, Marcus V, MD;  [facility];  Service: Orthopedics;  Laterality: Right;  ? AMPUTATION Right 07/19/2021  ? Procedure: RIGHT FOOT 1ST RAY AMPUTATION;  Surgeon: Nadara Mustarduda, Marcus V, MD;  [facility];  Service: Orthopedics;  Laterality: Right;  ? CESAREAN SECTION  1970  ? Dr. Aquilla HackerPaul Bower, [facility]  ? CESAREAN SECTION  1973  ? Dr. Aquilla HackerPaul Bower, [facility]  ? CORONARY ARTERY BYPASS GRAFT    ? ? ?Family History  ?Problem Relation Age of Onset  ? Congestive Heart Failure Mother   ?     [facility]  ? Arthritis Mother   ?     [facility]  ? Osteoporosis Mother   ?     [facility]  ? Heart attack Father   ?     Per [facility]  ? Diabetes Sister   ?     [facility]  ? Emphysema Brother   ?     [facility]  ? Heart disease Brother   ?     [facility]  [facility]  ? ? ?  ?Social History  ? ?Socioeconomic History  ? Marital status: Widowed  ?  Spouse name: Not on file  ? Number of children: Not on file  ? Years of education: Not on file  ? Highest education level: Not on file  ?Occupational History  ? Not on file  ?Tobacco Use  ? Smoking status: Never  ? Smokeless tobacco: Never  ?Vaping Use  ? Vaping Use: Never used  ?Substance and Sexual Activity  ? Alcohol use: Never  ?  Drug use: Never  ? Sexual activity: Not on file  ?Other Topics Concern  ? Not on file  ?Social History Narrative  ? Per [facility] New Patient Packet Abstracted on 02/14/2021  ?   ? Diet: Left blank   ?   ? Caffeine: Yes  ?   ? Married, if yes what year: Widowed, married in 1969  ?   ? Do you live in a house, apartment, assisted living, condo, trailer, ect: Assisted Living   ?   ? Is it one or more stories: 2  ?   ? How many persons live in your home?One  ?   ? Pets:No  ?   ? Highest level or education completed:   ?   ? Current/Past profession: Accountant   ?   ? Exercise:        Yes          Type and how often: Walking 3-4 time weekly   ?   ?   ? Living Will: Yes  ? DNR: No  ? POA/HPOA: Yes  ?   ? ?Social Determinants of Health  ? ?Financial Resource Strain: Not on file  ?Food Insecurity: Not on file  ?Transportation Needs: Not on file  ?Physical Activity: Not on file  ?Stress: Not on file  ?Social Connections: Not on file  ? ? ?Allergies  ?Allergen Reactions  ? Penicillins Anaphylaxis  ? ? ? ?Current Outpatient Medications:  ?  acetaminophen (TYLENOL) 500 MG tablet, Take 1,000 mg by mouth in the morning and at bedtime., Disp: , Rfl:  ?  aspirin EC 81 MG tablet, Take 81 mg by mouth in the morning. Swallow whole., Disp: , Rfl:  ?  atorvastatin (LIPITOR) 40 MG tablet, Take 40 mg by mouth at bedtime., Disp: , Rfl:  ?  Biotin w/ Vitamins C & E (HAIR/SKIN/NAILS PO), Take 1 tablet by mouth in the morning., Disp: , Rfl:  ?  brimonidine (ALPHAGAN) 0.2 % ophthalmic solution, Place 1 drop into the left eye 3 (three)  times daily., Disp: , Rfl:  ?  Calcium Carbonate (CALCIUM-CARB 600 PO), Take 600 mg by mouth in the morning., Disp: , Rfl:  ?  clopidogrel (PLAVIX) 75 MG tablet, Take 75 mg by mouth in the morning., Disp: , Rfl:

## 2021-12-18 LAB — BASIC METABOLIC PANEL
BUN: 25 — AB (ref 4–21)
CO2: 27 — AB (ref 13–22)
Chloride: 99 (ref 99–108)
Creatinine: 1 (ref 0.5–1.1)
Glucose: 426
Potassium: 5 mEq/L (ref 3.5–5.1)
Sodium: 137 (ref 137–147)

## 2021-12-18 LAB — HEMOGLOBIN A1C: Hemoglobin A1C: 11.8

## 2021-12-18 LAB — HEPATIC FUNCTION PANEL
ALT: 9 U/L (ref 7–35)
AST: 14 (ref 13–35)
Alkaline Phosphatase: 139 — AB (ref 25–125)

## 2021-12-18 LAB — CBC AND DIFFERENTIAL
HCT: 27 — AB (ref 36–46)
Hemoglobin: 9.1 — AB (ref 12.0–16.0)
Platelets: 363 10*3/uL (ref 150–400)
WBC: 5.9

## 2021-12-18 LAB — COMPREHENSIVE METABOLIC PANEL
Albumin: 3.2 — AB (ref 3.5–5.0)
Calcium: 8.8 (ref 8.7–10.7)
Globulin: 2.7

## 2021-12-18 LAB — CBC: RBC: 3.08 — AB (ref 3.87–5.11)

## 2021-12-24 ENCOUNTER — Telehealth: Payer: Self-pay

## 2021-12-24 NOTE — Telephone Encounter (Signed)
Patient called office requesting an appt for a 2nd opinion from Caitlin Sharp. Patient was seen by infectious disease on 12/16/2021 and stated Caitlin Sharp advised he wants to amputate the remainder of Caitlin Sharp toes. Patient reports she has had RLE Swelling 1 week, 4/10 pain, still has wounds. Swelling alleviates with resting and elevation. She was also prescribed a 10 day course of Doxycycline for Cellulitis of the RLE that she has now completed on 12/19/21. She states Caitlin Sharp helped heal Caitlin Sharp previous foot wound and she would like him to look at Caitlin Sharp leg after Caitlin Sharp MRI scheduled this week. Pt scheduled. Pt voiced Caitlin Sharp understanding ?

## 2021-12-26 ENCOUNTER — Ambulatory Visit: Payer: Medicare Other | Admitting: Infectious Disease

## 2021-12-26 ENCOUNTER — Ambulatory Visit (HOSPITAL_COMMUNITY)
Admission: RE | Admit: 2021-12-26 | Discharge: 2021-12-26 | Disposition: A | Discharge status: Home / Self Care | Payer: Medicare Other | Source: Ambulatory Visit | Attending: Infectious Disease | Admitting: Infectious Disease

## 2021-12-26 DIAGNOSIS — M869 Osteomyelitis, unspecified: Secondary | ICD-10-CM | POA: Diagnosis present

## 2021-12-26 MED ORDER — GADOBUTROL 1 MMOL/ML IV SOLN
6.0000 mL | Freq: Once | INTRAVENOUS | Status: AC | PRN
  Administered 2021-12-26: 6 mL via INTRAVENOUS

## 2021-12-27 ENCOUNTER — Telehealth: Payer: Self-pay | Admitting: Internal Medicine

## 2021-12-27 NOTE — Telephone Encounter (Signed)
Called by the patients son regarding MRI results of the right foot and discussed results and including extension to cuneiform bones.  I recommended he get her to an orthopedist and she is established with Dr. Lajoyce Cornersuda and will need consideration of amputation.  Medical therapy will not be curative.   ?He voiced his understanding.  ?Gardiner Barefootobert W Tyja Gortney, MD  ?

## 2021-12-29 ENCOUNTER — Other Ambulatory Visit: Payer: Self-pay | Admitting: Infectious Disease

## 2021-12-29 DIAGNOSIS — M869 Osteomyelitis, unspecified: Secondary | ICD-10-CM

## 2021-12-29 NOTE — Progress Notes (Signed)
Order for orthopedic surgery referral see MRI  ?

## 2021-12-30 ENCOUNTER — Telehealth: Payer: Self-pay

## 2021-12-30 ENCOUNTER — Non-Acute Institutional Stay: Payer: Medicare Other | Admitting: Internal Medicine

## 2021-12-30 ENCOUNTER — Encounter: Payer: Self-pay | Admitting: Internal Medicine

## 2021-12-30 DIAGNOSIS — M8660 Other chronic osteomyelitis, unspecified site: Secondary | ICD-10-CM | POA: Diagnosis not present

## 2021-12-30 DIAGNOSIS — E1065 Type 1 diabetes mellitus with hyperglycemia: Secondary | ICD-10-CM

## 2021-12-30 DIAGNOSIS — E785 Hyperlipidemia, unspecified: Secondary | ICD-10-CM | POA: Diagnosis not present

## 2021-12-30 DIAGNOSIS — M869 Osteomyelitis, unspecified: Secondary | ICD-10-CM

## 2021-12-30 DIAGNOSIS — L03115 Cellulitis of right lower limb: Secondary | ICD-10-CM | POA: Diagnosis not present

## 2021-12-30 NOTE — Telephone Encounter (Signed)
-----   Message from Randall Hiss, MD sent at 12/29/2021  8:53 PM EDT ----- ?Regarding: pt needs to be seen by orthopedics ?She did not want to see Dr Lajoyce Corners again but her vascular surgeon told me before we go tthe MRI that he would not handle surgery with her unless it was something such as a BKA. Therefore will need to refer to a different orthopedic surgeon. My choice if she will not see Dr. Lajoyce Corners is Dr. Victorino Dike. I can put referral in but it is very important patient be seen she has muttiple focie of osteo and an abscess. I cannot cure this with antibiotics alone. She is going to need surgery ?----- Message ----- ?From: Interface, Rad Results In ?Sent: 12/26/2021  11:04 PM EDT ?To: Randall Hiss, MD ? ? ?

## 2021-12-30 NOTE — Telephone Encounter (Signed)
Sp[facility] to [place], [facility] - patient son spoke with Dr.Comer over the weekend regarding MRI results. Patient doesn't want to see Dr.Duda - informed Rene KocherRegina that Dr.Van Dam offered to order a referral for patient to see Dr.Hewitt. Patient is unsure of who she wants to see. Rene KocherRegina stated that she would speak with patient and give me a call back. ? ? ?Caitlin Sharp Charlottie Peragine, CMA ? ?

## 2021-12-30 NOTE — Progress Notes (Signed)
?Location:   [facility] ?Nursing Home Room Number: 620 ?Place of Service:  ALF (13) ?Provider:  Einar CrowGupta, Chrystian Ressler MD ? ?Mahlon GammonGupta, Nilan Iddings L, MD ? ?Patient Care Team: ?Mahlon GammonGupta, Aleja Yearwood L, MD as PCP - General (Internal Medicine) ?Lorne SkeensHayes, Bartlett H, MD as Referring Physician (Ophthalmology) ?Cheek, Osborn CohoHerman Barrett, MD as Referring Physician (Cardiology) ?Bertram Galahandra, Madhulika, MD as Referring Physician (Cardiology) ? ?Extended Emergency Contact Information ?Primary Emergency Contact: Scibilia,Nathan ?Address: [address] ?         [place], [place] [address] [place] ?Home Phone: [phone] ?Mobile Phone: [phone] ?Relation: Son ?Preferred language: English ?Interpreter needed? No ?Secondary Emergency Contact: [name],[name] ?Mobile Phone: [phone] ?Relation: Relative ? ?Code Status:  Full Code ?Goals of care: Advanced Directive information ? ?  10/07/2021  ?  3:56 PM  ?Advanced Directives  ?Does Patient Have a Medical Advance Directive? Yes  ?Type of Advance Directive Healthcare Power of Attorney  ?Does patient want to make changes to medical advance directive? No - Patient declined  ?Copy of Healthcare Power of Attorney in Chart? Yes - validated most recent copy scanned in chart (See row information)  ? ? ? ?Chief Complaint  ?Patient presents with  ? Acute Visit  ? ? ?HPI:  ?Pt is a 71 y.o. female seen today for an acute visit for Redness near her Amputation scar with some redness also on her shimn area ? ?[place] ? ?H/o Type 1 Diabetes For past 50 years. Has been on Pump but now Long Acting Insulin and Humalog now ?CAD S/o CABG in 2012 osteoporosis 2021 -2.7 ?Glaucoma with Poor Vision ?Mild Cognitive impairment ?Patient was diagnosed with Osteomyelitis on 7/27 Underwent Right Great toe  amputation on 7/27 ?Elective Amputation of Right Foot 1St ray by Dr Lajoyce Cornersuda on 10/28 ?S/p Vascular study by Dr Karin Lieuobins  ?Widely patent vasculature to the level of the knee.  Diffuse tibial disease  with focal area of flow-limiting stenosis appreciated in the posterior tibial artery.  No flow-limiting stenosis  ? ?Patient was treated with Oral antibiotics for her non healing wound after amputation ?Inspite of some worry that she has Persistent Osteomyelitis signs on MRI ?But clinically she was doing better ? ?But for past few weeks her Feet is red again.on Amputation scar ?Was started on Doxycyline. Repeat MRI by Dr Zenaida NieceVan dam shows Osteomyelitis in Middle and Lateral Cuneiform bones and Metatarsals  ?He had [facility] as she does not want to go back to Dr Lajoyce Cornersuda ?Also has appointment with Dr Karin Lieuobins this [place] ? ?Patient says she is agreeable for BKA after she get opinion from Dr Karin Lieuobins ? ?She continues to be on Doxycyline for Redness which is not much better and is edematous ?She also has area in her shin which is red. She says she was in the wedding this weekend and caused skin Tear  ?No Fever or Chills ?Cant feel pain due to her Neuropathy ?Past Medical History:  ?Diagnosis Date  ? Abnormal thyroid blood test   ? [facility] Dr[facility]Pearson  ? Acute deep vein thrombosis of right peroneal vein (HCC) 10/05/2020  ? RLE DVT  ? Anxiety   ? Arthritis   ? RA  ? Coronary artery disease   ? Diabetes type I (HCC)   ? [facility]  ? Diabetic retinopathy (HCC)   ? [facility]  ? DKA (diabetic ketoacidosis) (HCC) 2021  ? Dr.William Zackowski.  Insulin Pump was out of Insulin  ? Glaucoma   ? Per [facility] New Patient Packet  ? H/O heart bypass surgery 2012  ? Dr. Excell Seltzer, Per [facility] New Patient Packet  ? Heart murmur   ? when I was a teenager  ? History of blood transfusion   ? History of kidney stones   ? passed  ? Hx of CABG   ? Per records received form [facility], Dr.Pearson   ? Left bundle branch block   ? Mild mitral regurgitation   ? Per records from [facility], Dr.Pearson   ? Neuropathy 1976  ? Per [facility] New Patient Packet  ? NSTEMI (non-ST elevated  myocardial infarction) (HCC) 09/21/2020  ? NSTEMI/demand ischemic in setting of DKA, patent grafts by 09/24/20 LHC, continue medical therapy  ? Osteoporosis   ? T Score -2.7 , Per [facility] New Patient Packet  ? 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 Per Records recevied from [facility], Dr.Pearson  ? PVD (peripheral vascular disease) (HCC) 10/27/2021  ? Type 1 diabetes mellitus with hyperglycemia (HCC) 10/28/2021  ? ?Past Surgical History:  ?Procedure Laterality Date  ? ABDOMINAL AORTOGRAM W/LOWER EXTREMITY N/A 10/02/2021  ? Procedure: ABDOMINAL AORTOGRAM W/LOWER EXTREMITY;  Surgeon: Victorino Sparrow, MD;  Location: [facility] INVASIVE CV LAB;  Service: Cardiovascular;  Laterality: N/A;  ? AMPUTATION Right 04/17/2021  ? Procedure: RIGHT GREAT TOE AMPUTATION;  Surgeon: [name], MD;  Location[facility] OR;  Service: Orthopedics;  Laterality: Right;  ? AMPUTATION Right 07/19/2021  ? Procedure: RIGHT FOOT 1ST RAY AMPUTATION;  Surgeon: [name], MD;  Location: [facility] OR;  Service: Orthopedics;  Laterality: Right;  ? CESAREAN SECTION  1970  ? Dr. Aquilla Hacker, Per [facility] New Patient Packet  ? CESAREAN SECTION  1973  ? Dr. Aquilla Hacker, Per [facility] New Patient Packet  ? CORONARY ARTERY BYPASS GRAFT    ? ? ?Allergies  ?Allergen Reactions  ? Penicillins Anaphylaxis  ? ? ?Allergies as of 12/30/2021   ? ?   Reactions  ? Penicillins Anaphylaxis  ? ?  ? ?  ?Medication List  ?  ? ?  ? Accurate as of December 30, 2021  4:19 PM. If you have any questions, ask your nurse or doctor.  ?  ?  ? ?  ? ?acetaminophen 500 MG tablet ?Commonly known as: TYLENOL ?Take 1,000 mg by mouth in the morning and at bedtime. ?  ?aspirin EC 81 MG tablet ?Take 81 mg by mouth in the morning. Swallow whole. ?  ?atorvastatin 40 MG tablet ?Commonly known as: LIPITOR ?Take 40 mg by mouth at bedtime. ?  ?brimonidine 0.2 % ophthalmic solution ?Commonly known as: ALPHAGAN ?Place 1 drop into the left eye 3 (three)  times daily. ?  ?CALCIUM-CARB 600 PO ?Take 600 mg by mouth in the morning. ?  ?clopidogrel 75 MG tablet ?Commonly known as: PLAVIX ?Take 75 mg by mouth in the morning. ?  ?diclofenac Sodium 1 % Gel ?Commonly known as: VOLTAREN ?Apply 1 application topically 3 (three) times daily as needed (applied to right leg when needed for pain). ?  ?doxycycline 100 MG EC tablet ?Commonly known as: DORYX ?Take 100 mg by mouth 2 (two) times daily. ?  ?Fish Oil 1000 MG Caps ?Take 1,000 mg by mouth in the morning. ?  ?FreeStyle Libre 14 Day Sensor Misc ?Inject 1 sensor to the skin every 14 days for continuous glucose  monitoring. ?  ?furosemide 20 MG tablet ?Commonly known as: LASIX ?Take 20 mg by mouth in the morning. ?  ?glucosamine-chondroitin 500-400 MG tablet ?Take 1 tablet by mouth every morning. ?  ?glucose blood test strip ?1 each by Other route as needed for other. Accu-chek ?  ?HAIR/SKIN/NAILS PO ?Take 1 tablet by mouth in the morning. ?  ?I-VITE PO ?Take 1 tablet by mouth in the morning. True Vision Eye Health Capsule ?  ?insulin lispro 100 UNIT/ML KwikPen ?Commonly known as: HUMALOG ?Inject 2-10 Units into the skin 3 (three) times daily. PATIENT SELF ADMINISTERS ?  ?Lantus SoloStar 100 UNIT/ML Solostar Pen ?Generic drug: insulin glargine ?Inject 16 Units into the skin at bedtime. PATIENT SELF ADMINISTERS ?  ?latanoprost 0.005 % ophthalmic solution ?Commonly known as: XALATAN ?Place 1 drop into both eyes every evening. ?  ?lisinopril 2.5 MG tablet ?Commonly known as: ZESTRIL ?Take 2.5 mg by mouth in the morning. ?  ?miconazole 200 MG vaginal suppository ?Commonly known as: MICOTIN ?Place 200 mg vaginally as needed. ?  ?nitroGLYCERIN 0.4 MG SL tablet ?Commonly known as: NITROSTAT ?Place 0.4 mg under the tongue every 5 (five) minutes x 3 doses as needed for chest pain. ?  ?Pen Needles 3/16" 31G X 5 MM Misc ?1 Device by Does not apply route 5 (five) times daily. With insulin administration ?  ?saccharomyces boulardii 250 MG  capsule ?Commonly known as: FLORASTOR ?Take 250 mg by mouth 2 (two) times daily. ?  ?sennosides-docusate sodium 8.6-50 MG tablet ?Commonly known as: SENOKOT-S ?Take 1 tablet by mouth every other day. IN

## 2021-12-30 NOTE — Telephone Encounter (Signed)
Patient agreed to referral to Dr.Hewitt.  ? ?Caitlin Sharp [name], CMA ? ?

## 2022-01-02 ENCOUNTER — Encounter: Payer: Self-pay | Admitting: Adult Health

## 2022-01-02 ENCOUNTER — Non-Acute Institutional Stay: Payer: Medicare Other | Admitting: Adult Health

## 2022-01-02 DIAGNOSIS — M8660 Other chronic osteomyelitis, unspecified site: Secondary | ICD-10-CM

## 2022-01-02 DIAGNOSIS — S90425A Blister (nonthermal), left lesser toe(s), initial encounter: Secondary | ICD-10-CM | POA: Diagnosis not present

## 2022-01-02 DIAGNOSIS — E1065 Type 1 diabetes mellitus with hyperglycemia: Secondary | ICD-10-CM

## 2022-01-02 NOTE — Progress Notes (Signed)
?Location:  [facility] ?  ?Place of Service:  ALF (13) ?Provider:   ?Cindi Carbon, ANP ?[place] ?[phone] ? ? ?[name], MD ? ?Patient Care Team: ?[name], MD as PCP - General (Internal Medicine) ?San Morelle, MD as Referring Physician (Ophthalmology) ?Cheek, Gayleen Orem, MD as Referring Physician (Cardiology) ?Alyssa Grove, MD as Referring Physician (Cardiology) ? ?Extended Emergency Contact Information ?Primary Emergency Contact: Vanderkolk,Nathan ?Address: [address] ?         [place], [place] [address] [place] ?Home Phone: [phone] ?Mobile Phone: [phone] ?Relation: Son ?Preferred language: English ?Interpreter needed? No ?Secondary Emergency Contact: [name],[name] ?Mobile Phone: [phone] ?Relation: Relative ? ?Code Status:   ?Goals of care: Advanced Directive information ? ?  10/07/2021  ?  3:56 PM  ?Advanced Directives  ?Does Patient Have a Medical Advance Directive? Yes  ?Type of Advance Directive Healthcare Power of Attorney  ?Does patient want to make changes to medical advance directive? No - Patient declined  ?Copy of [place] in Chart? Yes - validated most recent copy scanned in chart (See row information)  ? ? ? ?Chief Complaint  ?Patient presents with  ? Acute Visit  ?  Evaluate left foot  ? ? ?HPI:  ?Pt is a 71 y.o. female seen today for an acute visit for evaluation of the left foot due to an area of concern. Nurse reports this morning she noticed a dark area on the tip of the left toe. The area is not painful, there is no redness or warmth, and she does not have fever. Feels that she is in her usual state of health. She reports she filed her own toenails prior to the black area appearing and does not like to see podiatry. She feels it is related to the tight hose she wears. She is wearing a regular shoe on the left and an open toe fracture foot shoe on the right.  ? ?DMII:  ?Lantus was  increased on 4/11 by endocrinology to 20 units ?CBGS 99 this am 188 at lunch.  ?CBGs 4/12 Breakfast 344 lunch 269 Dinner 183 HS 360 ?Lab Results  ?Component Value Date  ? HGBA1C 11.8 12/18/2021  ? ?PMH right foot 1st ray ampuation 07/19/21, prior to this had right toe amputation 04/12/21 due to osteomyelitis. Saw Dr. Lucianne Lei dam 3/27 and referred to surgery again for possible amputation. She wanted to change surgeons. Seeing Dr. Doran Durand 4/14 ?Had abd aortogram with lower ext by Dr Virl Cagey: which showed diffuse tibial disease without flow-limiting stenosis 10/02/21 ? ?MRI 4.6.23 of right foot  ?IMPRESSION: ?1. Bone marrow edema of the medial, middle and lateral cuneiform ?bones as well as proximal aspect of the second and third metatarsals ?and focal edema of the medial aspect of the fourth metatarsal base, ?with enhancement on post contrast sequences consistent with ?osteomyelitis. ?  ?2. There is also focal marrow edema of the cuboid about the ?thickened peroneus longus tendon, which may be secondary to stress ?fracture or osteomyelitis. ?  ?3. Phlegmonous changes with foci of gas about the prior amputation ?site without evidence of well-defined fluid collection or abscess. ?  ?Started on doxy 4/6 for signs of continued foot infection. Omnicef add 4/10 by Dr. Lyndel Safe.  ?Past Medical History:  ?Diagnosis Date  ? Abnormal thyroid blood test   ? Per Records recevied from [facility], [place]  ? Acute deep vein thrombosis of right peroneal vein ([place]) 10/05/2020  ? RLE DVT  ?  Anxiety   ? Arthritis   ? RA  ? Coronary artery disease   ? Diabetes type I ([place])   ? Per [facility] New Patient Packet  ? Diabetic retinopathy ([place])   ? Per [facility] New Patient Packet  ? DKA (diabetic ketoacidosis) ([place]) 2021  ? Dr.William Zackowski.  Insulin Pump was out of Insulin  ? Glaucoma   ? Per [facility] New Patient Packet  ? H/O heart bypass surgery 2012  ? Dr. Luana Shu, Per [facility] New Patient Packet  ? Heart murmur   ? when I was a teenager  ? History of blood  transfusion   ? History of kidney stones   ? passed  ? Hx of CABG   ? Per records received form [facility], Dr.Pearson   ? Left bundle branch block   ? Mild mitral regurgitation   ? Per records from [facility], [place]   ? Neuropathy 1976  ? Per [facility] New Patient Packet  ? NSTEMI (non-ST elevated myocardial infarction) ([place]) 09/21/2020  ? NSTEMI/demand ischemic in setting of DKA, patent grafts by 09/24/20 [facility], continue medical therapy  ? Osteoporosis   ? T Score -2.7 , Per [facility] New Patient Packet  ? 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 Per Records recevied from [facility], [place]  ? PVD (peripheral vascular disease) ([place]) 10/27/2021  ? Type 1 diabetes mellitus with hyperglycemia ([place]) 10/28/2021  ? ?Past Surgical History:  ?Procedure Laterality Date  ? ABDOMINAL AORTOGRAM W/LOWER EXTREMITY N/A 10/02/2021  ? Procedure: ABDOMINAL AORTOGRAM W/LOWER EXTREMITY;  Surgeon: Broadus John, MD;  Location: [facility] CV LAB;  Service: Cardiovascular;  Laterality: N/A;  ? AMPUTATION Right 04/17/2021  ? Procedure: RIGHT GREAT TOE AMPUTATION;  Surgeon: Newt Minion, MD;  Location: [facility];  Service: Orthopedics;  Laterality: Right;  ? AMPUTATION Right 07/19/2021  ? Procedure: RIGHT FOOT 1ST RAY AMPUTATION;  Surgeon: Newt Minion, MD;  Location: [place];  Service: Orthopedics;  Laterality: Right;  ? [place]  ? Dr. Moses Manners, Per [facility] New Patient Packet  ? [place]  ? Dr. Moses Manners, Per [facility] New Patient Packet  ? CORONARY ARTERY BYPASS GRAFT    ? ? ?Allergies  ?Allergen Reactions  ? Penicillins Anaphylaxis  ? ? ?Outpatient Encounter Medications as of 01/02/2022  ?Medication Sig  ? acetaminophen (TYLENOL) 500 MG tablet Take 1,000 mg by mouth in the morning and at bedtime.  ? aspirin EC 81 MG tablet Take 81 mg by mouth in the morning. Swallow whole.  ? atorvastatin (LIPITOR) 40 MG tablet Take 40 mg by mouth at bedtime.  ?  Biotin w/ Vitamins C & E (HAIR/SKIN/NAILS PO) Take 1 tablet by mouth in the morning.  ? brimonidine (ALPHAGAN) 0.2 % ophthalmic solution Place 1 drop into the left eye 3 (three) times daily.  ? Calcium Carbonate (CALCIUM-CARB 600 PO) Take 600 mg by mouth in the morning.  ? cefdinir (OMNICEF) 300 MG capsule Take 300 mg by mouth 2 (two) times daily.  ? clopidogrel (PLAVIX) 75 MG tablet Take 75 mg by mouth in the morning.  ? Continuous Blood Gluc Sensor (FREESTYLE LIBRE 14 DAY SENSOR) MISC Inject 1 sensor to the skin every 14 days for continuous glucose monitoring.  ? diclofenac Sodium (VOLTAREN) 1 % GEL Apply 1 application topically 3 (three) times daily as needed (applied to right leg when needed for pain).  ? doxycycline (  DORYX) 100 MG EC tablet Take 100 mg by mouth 2 (two) times daily.  ? furosemide (LASIX) 20 MG tablet Take 20 mg by mouth in the morning.  ? glucosamine-chondroitin 500-400 MG tablet Take 1 tablet by mouth every morning.  ? glucose blood test strip 1 each by Other route as needed for other. Accu-chek  ? insulin glargine (LANTUS SOLOSTAR) 100 UNIT/ML Solostar Pen Inject 20 Units into the skin at bedtime. PATIENT SELF ADMINISTERS  ? insulin lispro (HUMALOG) 100 UNIT/ML KwikPen Inject 2-10 Units into the skin 3 (three) times daily. PATIENT SELF ADMINISTERS  ? Insulin Pen Needle (PEN NEEDLES 3/16") 31G X 5 MM MISC 1 Device by Does not apply route 5 (five) times daily. With insulin administration  ? latanoprost (XALATAN) 0.005 % ophthalmic solution Place 1 drop into both eyes every evening.  ? lisinopril (ZESTRIL) 2.5 MG tablet Take 2.5 mg by mouth in the morning.  ? miconazole (MICOTIN) 200 MG vaginal suppository Place 200 mg vaginally as needed.  ? Multiple Vitamins-Minerals (I-VITE PO) Take 1 tablet by mouth in the morning. True Vision Eye Health Capsule  ? nitroGLYCERIN (NITROSTAT) 0.4 MG SL tablet Place 0.4 mg under the tongue every 5 (five) minutes x 3 doses as needed for chest pain.  ? Omega-3  Fatty Acids (FISH OIL) 1000 MG CAPS Take 1,000 mg by mouth in the morning.  ? saccharomyces boulardii (FLORASTOR) 250 MG capsule Take 250 mg by mouth 2 (two) times daily.  ? sennosides-docusate sodium (SENOKOT-S) 8.

## 2022-01-03 ENCOUNTER — Ambulatory Visit: Payer: Medicare Other | Admitting: Vascular Surgery

## 2022-01-16 ENCOUNTER — Ambulatory Visit: Payer: Medicare Other | Admitting: Infectious Disease

## 2022-01-20 ENCOUNTER — Encounter: Payer: Self-pay | Admitting: Adult Health

## 2022-01-20 ENCOUNTER — Non-Acute Institutional Stay: Payer: Medicare Other | Admitting: Adult Health

## 2022-01-20 VITALS — BP 128/60 | HR 80 | Temp 97.0°F | Ht 67.0 in | Wt 130.2 lb

## 2022-01-20 DIAGNOSIS — F419 Anxiety disorder, unspecified: Secondary | ICD-10-CM | POA: Diagnosis not present

## 2022-01-20 DIAGNOSIS — E1065 Type 1 diabetes mellitus with hyperglycemia: Secondary | ICD-10-CM | POA: Diagnosis not present

## 2022-01-20 DIAGNOSIS — M8660 Other chronic osteomyelitis, unspecified site: Secondary | ICD-10-CM

## 2022-01-20 MED ORDER — ALPRAZOLAM 0.5 MG PO TABS: 0.2500 mg | ORAL_TABLET | Freq: Three times a day (TID) | ORAL | 0 refills | Status: DC | PRN

## 2022-01-20 NOTE — Progress Notes (Signed)
? ?[facility] ? ?POS: clinic ? ?Provider:  ?Cindi Carbon, ANP ?[place] ?[phone] ? ? ?Code Status:  ?Goals of Care:  ? ?  10/07/2021  ?  3:56 PM  ?Advanced Directives  ?Does Patient Have a Medical Advance Directive? Yes  ?Type of Advance Directive Healthcare Power of Attorney  ?Does patient want to make changes to medical advance directive? No - Patient declined  ?Copy of [place] in Chart? Yes - validated most recent copy scanned in chart (See row information)  ? ? ? ?Chief Complaint  ?Patient presents with  ? Acute Visit  ?  Patient returns to the clinic to discuss her upcoming surgery and blood sugars.   ? ? ?HPI: Patient is a 71 y.o. female seen today for an acute visit for concern about upcoming surgery. She reports it has been recommended that she have a BKA of the right foot due to chronic osteomyelitis.  ? ?She has decided to move forward but wanted our input. ? ?PMH right foot 1st ray ampuation 07/19/21, prior to this had right toe amputation 04/12/21 due to osteomyelitis. Saw Dr. Lucianne Lei dam 3/27 and referred to surgery again for possible amputation. She wanted to change surgeons. Saw Dr. Doran Durand 4/18 ?Had abd aortogram with lower ext by Dr Virl Cagey: which showed diffuse tibial disease without flow-limiting stenosis 10/02/21 ? ?MRI 4.6.23 of right foot  ?IMPRESSION: ?1. Bone marrow edema of the medial, middle and lateral cuneiform ?bones as well as proximal aspect of the second and third metatarsals ?and focal edema of the medial aspect of the fourth metatarsal base, ?with enhancement on post contrast sequences consistent with ?osteomyelitis. ?  ?2. There is also focal marrow edema of the cuboid about the ?thickened peroneus longus tendon, which may be secondary to stress ?fracture or osteomyelitis. ? ?Saw Dr Doran Durand 01/07/22 with ortho, amputation BKA recommended ? ?Saw Dr Tamala Julian 12/17/21 Lantus and Humalog increased due to poorly controlled CBG. Lantus increased to 20 units  4/25. He told her he would like her sugars to be better controlled prior to having surgery. ? ?She was on asa and plavix and now is only plavix per cardiology.  ? ?She is feeling anxious about the surgery which causes her sugar to go up ? ?Past Medical History:  ?Diagnosis Date  ? Abnormal thyroid blood test   ? Per Records recevied from [facility], [place]  ? Acute deep vein thrombosis of right peroneal vein ([place]) 10/05/2020  ? RLE DVT  ? Anxiety   ? Arthritis   ? RA  ? Coronary artery disease   ? Diabetes type I ([place])   ? Per [facility] New Patient Packet  ? Diabetic retinopathy ([place])   ? Per [facility] New Patient Packet  ? DKA (diabetic ketoacidosis) ([place]) 2021  ? Dr.William Zackowski.  Insulin Pump was out of Insulin  ? Glaucoma   ? Per [facility] New Patient Packet  ? H/O heart bypass surgery 2012  ? Dr. Luana Shu, Per [facility] New Patient Packet  ? Heart murmur   ? when I was a teenager  ? History of blood transfusion   ? History of kidney stones   ? passed  ? Hx of CABG   ? Per records received form [facility], Dr.Pearson   ? Left bundle branch block   ? Mild mitral regurgitation   ? Per records from [facility], [place]   ? Neuropathy 1976  ? Per [facility] New Patient Packet  ? NSTEMI (non-ST elevated  myocardial infarction) ([place]) 09/21/2020  ? NSTEMI/demand ischemic in setting of DKA, patent grafts by 09/24/20 LHC, continue medical therapy  ? Osteoporosis   ? T Score -2.7 , Per [facility] New Patient Packet  ? 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 Per Records recevied from [facility], [place]  ? PVD (peripheral vascular disease) ([place]) 10/27/2021  ? Type 1 diabetes mellitus with hyperglycemia ([place]) 10/28/2021  ? ? ?Past Surgical History:  ?Procedure Laterality Date  ? ABDOMINAL AORTOGRAM W/LOWER EXTREMITY N/A 10/02/2021  ? Procedure: ABDOMINAL AORTOGRAM W/LOWER EXTREMITY;  Surgeon: Broadus John, MD;  Location: [place] CV LAB;  Service: Cardiovascular;   Laterality: N/A;  ? AMPUTATION Right 04/17/2021  ? Procedure: RIGHT GREAT TOE AMPUTATION;  Surgeon: Newt Minion, MD;  Location: [place];  Service: Orthopedics;  Laterality: Right;  ? AMPUTATION Right 07/19/2021  ? Procedure: RIGHT FOOT 1ST RAY AMPUTATION;  Surgeon: Newt Minion, MD;  Location: [place];  Service: Orthopedics;  Laterality: Right;  ? [place]  ? Dr. Moses Manners, Per [facility] New Patient Packet  ? [place]  ? Dr. Moses Manners, Per [facility] New Patient Packet  ? CORONARY ARTERY BYPASS GRAFT    ? ? ?Allergies  ?Allergen Reactions  ? Penicillins Anaphylaxis  ? ? ?Outpatient Encounter Medications as of 01/20/2022  ?Medication Sig  ? acetaminophen (TYLENOL) 500 MG tablet Take 1,000 mg by mouth in the morning and at bedtime.  ? atorvastatin (LIPITOR) 40 MG tablet Take 40 mg by mouth at bedtime.  ? Biotin w/ Vitamins C & E (HAIR/SKIN/NAILS PO) Take 1 tablet by mouth in the morning.  ? brimonidine (ALPHAGAN) 0.2 % ophthalmic solution Place 1 drop into the left eye 3 (three) times daily.  ? Calcium Carbonate (CALCIUM-CARB 600 PO) Take 600 mg by mouth in the morning.  ? clopidogrel (PLAVIX) 75 MG tablet Take 75 mg by mouth in the morning.  ? Continuous Blood Gluc Sensor (FREESTYLE LIBRE 14 DAY SENSOR) MISC Inject 1 sensor to the skin every 14 days for continuous glucose monitoring.  ? diclofenac Sodium (VOLTAREN) 1 % GEL Apply 1 application topically 3 (three) times daily as needed (applied to right leg when needed for pain).  ? furosemide (LASIX) 20 MG tablet Take 20 mg by mouth in the morning.  ? glucosamine-chondroitin 500-400 MG tablet Take 1 tablet by mouth every morning.  ? glucose blood test strip 1 each by Other route as needed for other. Accu-chek  ? insulin glargine (LANTUS SOLOSTAR) 100 UNIT/ML Solostar Pen Inject 16 Units into the skin at bedtime. PATIENT SELF ADMINISTERS  ? insulin lispro (HUMALOG) 100 UNIT/ML KwikPen Inject 2-10 Units into the skin 3 (three) times daily. PATIENT SELF  ADMINISTERS  ? Insulin Pen Needle (PEN NEEDLES 3/16") 31G X 5 MM MISC 1 Device by Does not apply route 5 (five) times daily. With insulin administration  ? latanoprost (XALATAN) 0.005 % ophthalmic solution Place 1 drop into both eyes every evening.  ? lisinopril (ZESTRIL) 2.5 MG tablet Take 2.5 mg by mouth in the morning.  ? miconazole (MICOTIN) 200 MG vaginal suppository Place 200 mg vaginally as needed.  ? Multiple Vitamins-Minerals (I-VITE PO) Take 1 tablet by mouth in the morning. True Vision Eye Health Capsule  ? nitroGLYCERIN (NITROSTAT) 0.4 MG SL tablet Place 0.4 mg under the tongue every 5 (five) minutes x 3 doses as needed for chest pain.  ?  Omega-3 Fatty Acids (FISH OIL) 1000 MG CAPS Take 1,000 mg by mouth in the morning.  ? omeprazole (PRILOSEC) 20 MG capsule Take 20 mg by mouth daily.  ? sennosides-docusate sodium (SENOKOT-S) 8.6-50 MG tablet Take 1 tablet by mouth every other day. IN THE MORNING  ? timolol (TIMOPTIC) 0.5 % ophthalmic solution Place 1 drop into the left eye in the morning and at bedtime.  ? ALPRAZolam (XANAX) 0.5 MG tablet Take 0.5 tablets (0.25 mg total) by mouth every 8 (eight) hours as needed for anxiety.  ? [DISCONTINUED] aspirin EC 81 MG tablet Take 81 mg by mouth in the morning. Swallow whole.  ? [DISCONTINUED] cefdinir (OMNICEF) 300 MG capsule Take 300 mg by mouth 2 (two) times daily.  ? [DISCONTINUED] doxycycline (DORYX) 100 MG EC tablet Take 100 mg by mouth 2 (two) times daily.  ? [DISCONTINUED] saccharomyces boulardii (FLORASTOR) 250 MG capsule Take 250 mg by mouth 2 (two) times daily.  ? ?No facility-administered encounter medications on file as of 01/20/2022.  ? ? ?Review of Systems:  ?Review of Systems  ?Constitutional:  Negative for activity change, appetite change, chills, diaphoresis, fatigue, fever and unexpected weight change.  ?HENT:  Negative for congestion.   ?Respiratory:  Negative for cough, shortness of breath and wheezing.   ?Cardiovascular:  Positive for leg  swelling. Negative for chest pain and palpitations.  ?Gastrointestinal:  Negative for abdominal distention, abdominal pain, constipation and diarrhea.  ?Genitourinary:  Negative for difficulty urinating and dysuria.

## 2022-01-24 LAB — HEMOGLOBIN A1C: Hemoglobin A1C: 12.7

## 2022-01-27 ENCOUNTER — Encounter: Payer: Self-pay | Admitting: Internal Medicine

## 2022-01-27 ENCOUNTER — Non-Acute Institutional Stay: Payer: Medicare Other | Admitting: Internal Medicine

## 2022-01-27 DIAGNOSIS — E1065 Type 1 diabetes mellitus with hyperglycemia: Secondary | ICD-10-CM | POA: Diagnosis not present

## 2022-01-27 DIAGNOSIS — I214 Non-ST elevation (NSTEMI) myocardial infarction: Secondary | ICD-10-CM

## 2022-01-27 DIAGNOSIS — M8660 Other chronic osteomyelitis, unspecified site: Secondary | ICD-10-CM

## 2022-01-27 DIAGNOSIS — E785 Hyperlipidemia, unspecified: Secondary | ICD-10-CM | POA: Diagnosis not present

## 2022-01-27 NOTE — Progress Notes (Signed)
?Location:   [facility] ?Nursing Home Room Number: 620 ?Place of Service:  ALF (13) ?Provider:  Einar Crow MD  ? ?[name], MD ? ?Patient Care Team: ?[name], MD as PCP - General (Internal Medicine) ?[name], MD as Referring Physician (Ophthalmology) ?Cheek, [name], MD as Referring Physician (Cardiology) ?Bertram Gala, MD as Referring Physician (Cardiology) ? ?Extended Emergency Contact Information ?Primary Emergency Contact: Solberg,Nathan ?Address: [address] ?         [place], [place] [address] [place] ?Home Phone: [phone] ?Mobile Phone: [phone] ?Relation: Son ?Preferred language: English ?Interpreter needed? No ?Secondary Emergency Contact: [name],[name] ?Mobile Phone: [phone] ?Relation: Relative ? ?Code Status:  Full Code ?Goals of care: Advanced Directive information ? ?  10/07/2021  ?  3:56 PM  ?Advanced Directives  ?Does Patient Have a Medical Advance Directive? Yes  ?Type of Advance Directive Healthcare Power of Attorney  ?Does patient want to make changes to medical advance directive? No - Patient declined  ?Copy of Healthcare Power of Attorney in Chart? Yes - validated most recent copy scanned in chart (See row information)  ? ? ? ?Chief Complaint  ?Patient presents with  ? Acute Visit  ? ? ?HPI:  ?Pt is a 71 y.o. female seen today for an acute visit for Blood sugars Management ? ?Lives in [place] in [place] ?  ?H/o Type 1 Diabetes For past 50 years. Has been on Pump but now Long Acting Insulin and Humalog now ?CAD S/o CABG in 2012 osteoporosis 2021 -2.7 ?Glaucoma with Poor Vision ?Mild Cognitive impairment ?Patient was diagnosed with Osteomyelitis on 7/27 Underwent Right Great toe  amputation on 7/27 ?Elective Amputation of Right Foot 1St ray by Dr Lajoyce Corners on 10/28 ?S/p Vascular study by Dr Karin Lieu  ?MRI by Dr Zenaida Niece dam shows Persistent Osteomyelitis in Middle and Lateral Cuneiform bones and Metatarsals  ?Awaiting BKA by Dr  Victorino Dike ? ?Seen today as her sugars are running anywhere from 70-400 ?Patient usually gives herself coverage but has not been able to keep up due to her Infection. Her A1C is 12.7 ?She is awaiting Clearance from Cardiology for BKA ?Patient denies any fever Chills or Nausea or vomiting ? ?Past Medical History:  ?Diagnosis Date  ? Abnormal thyroid blood test   ? Per Records recevied from [facility], Dr.Pearson  ? Acute deep vein thrombosis of right peroneal vein (H[facility]) 10/05/2020  ? RLE DVT  ? Anxiety   ? Arthritis   ? RA  ? Coronary artery disease   ? Diabetes type I (HCC)   ? Per [facility] New Patient Packet  ? Diabetic retinopathy (HCC)   ? Per [facility] New Patient Packet  ? DKA (diabetic ketoacidosis) (HCC) 2021  ? Dr.William Zackowski.  Insulin Pump was out of Insulin  ? Glaucoma   ? Per [facility] New Patient Packet  ? H/O heart bypass surgery 2012  ? Dr. Excell Seltzer, Per [facility] New Patient Packet  ? Heart murmur   ? when I was a teenager  ? History of blood transfusion   ? History of kidney stones   ? passed  ? Hx of CABG   ? Per records received form [facility], Dr.Pearson   ? Left bundle branch block   ? Mild mitral regurgitation   ? Per records from [facility], Dr.Pearson   ? Neuropathy 1976  ? Per [facility] New Patient Packet  ? NSTEMI (non-ST elevated myocardial infarction) (HCC) 09/21/2020  ? NSTEMI/demand ischemic in setting  of DKA, patent grafts by 09/24/20 [facility], continue medical therapy  ? Osteoporosis   ? T Score -2.7 , Per [facility] New Patient Packet  ? 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 Per Records recevied from [facility], Dr.Pearson  ? PVD (peripheral vascular disease) (HCC) 10/27/2021  ? Type 1 diabetes mellitus with hyperglycemia (HCC) 10/28/2021  ? ?Past Surgical History:  ?Procedure Laterality Date  ? ABDOMINAL AORTOGRAM W/LOWER EXTREMITY N/A 10/02/2021  ? Procedure: ABDOMINAL AORTOGRAM W/LOWER EXTREMITY;  Surgeon: Victorino Sparrow, MD;  Location: [facility]  INVASIVE CV LAB;  Service: Cardiovascular;  Laterality: N/A;  ? AMPUTATION Right 04/17/2021  ? Procedure: RIGHT GREAT TOE AMPUTATION;  Surgeon: [name], MD;  Location: [facility] OR;  Service: Orthopedics;  Laterality: Right;  ? AMPUTATION Right 07/19/2021  ? Procedure: RIGHT FOOT 1ST RAY AMPUTATION;  Surgeon: [name], MD;  Location: [facility] OR;  Service: Orthopedics;  Laterality: Right;  ? CESAREAN SECTION  1970  ? Dr. Aquilla Hacker, Per [facility] New Patient Packet  ? CESAREAN SECTION  1973  ? Dr. Aquilla Hacker, Per [facility] New Patient Packet  ? CORONARY ARTERY BYPASS GRAFT    ? ? ?Allergies  ?Allergen Reactions  ? Penicillins Anaphylaxis  ? ? ?Allergies as of 01/27/2022   ? ?   Reactions  ? Penicillins Anaphylaxis  ? ?  ? ?  ?Medication List  ?  ? ?  ? Accurate as of Jan 27, 2022  3:40 PM. If you have any questions, ask your nurse or doctor.  ?  ?  ? ?  ? ?acetaminophen 500 MG tablet ?Commonly known as: TYLENOL ?Take 1,000 mg by mouth in the morning and at bedtime. ?  ?ALPRAZolam 0.5 MG tablet ?Commonly known as: Prudy Feeler ?Take 0.5 tablets (0.25 mg total) by mouth every 8 (eight) hours as needed for anxiety. ?  ?atorvastatin 40 MG tablet ?Commonly known as: LIPITOR ?Take 40 mg by mouth at bedtime. ?  ?brimonidine 0.2 % ophthalmic solution ?Commonly known as: ALPHAGAN ?Place 1 drop into the left eye 3 (three) times daily. ?  ?CALCIUM-CARB 600 PO ?Take 600 mg by mouth in the morning. ?  ?clopidogrel 75 MG tablet ?Commonly known as: PLAVIX ?Take 75 mg by mouth in the morning. ?  ?diclofenac Sodium 1 % Gel ?Commonly known as: VOLTAREN ?Apply 1 application topically 3 (three) times daily as needed (applied to right leg when needed for pain). ?  ?Fish Oil 1000 MG Caps ?Take 1,000 mg by mouth in the morning. ?  ?FreeStyle Libre 14 Day Sensor Misc ?Inject 1 sensor to the skin every 14 days for continuous glucose monitoring. ?  ?furosemide 20 MG tablet ?Commonly known as: LASIX ?Take 20 mg by mouth in the morning. ?  ?glucosamine-chondroitin  500-400 MG tablet ?Take 1 tablet by mouth every morning. ?  ?glucose blood test strip ?1 each by Other route as needed for other. Accu-chek ?  ?HAIR/SKIN/NAILS PO ?Take 1 tablet by mouth in the morning. ?  ?I-VITE PO ?Take 1 tablet by mouth in the morning. True Vision Eye Health Capsule ?  ?insulin lispro 100 UNIT/ML KwikPen ?Commonly known as: HUMALOG ?Inject 2-10 Units into the skin 3 (three) times daily. PATIENT SELF ADMINISTERS ?  ?Lantus SoloStar 100 UNIT/ML Solostar Pen ?Generic drug: insulin glargine ?Inject 16 Units into the skin at bedtime. PATIENT SELF ADMINISTERS ?  ?latanoprost 0.005 % ophthalmic solution ?Commonly known as: XALATAN ?Place 1  drop into both eyes every evening. ?  ?lisinopril 2.5 MG tablet ?Commonly known as: ZESTRIL ?Take 2.5 mg by mouth in the morning. ?  ?miconazole 200 MG vaginal suppository ?Commonly known as: MICOTIN ?Place 200 mg vaginally as needed. ?  ?nitroGLYCERIN 0.4 MG SL tablet ?Commonly known as: NITROSTAT ?Place 0.4 mg under the tongue every 5 (five) minutes x 3 doses as needed for chest pain. ?  ?omeprazole 20 MG capsule ?Commonly known as: PRILOSEC ?Take 20 mg by mouth daily. ?  ?Pen Needles 3/16" 31G X 5 MM Misc ?1 Device by Does not apply route 5 (five) times daily. With insulin administration ?  ?sennosides-docusate sodium 8.6-50 MG tablet ?Commonly known as: SENOKOT-S ?Take 1 tablet by mouth every other day. IN THE MORNING ?  ?timolol 0.5 % ophthalmic solution ?Commonly known as: TIMOPTIC ?Place 1 drop into the left eye in the morning and at bedtime. ?  ? ?  ? ? ?Review of Systems  ?Constitutional:  Negative for activity change and appetite change.  ?HENT: Negative.    ?Respiratory:  Negative for cough and shortness of breath.   ?Cardiovascular:  Negative for leg swelling.  ?Gastrointestinal:  Negative for constipation.  ?Genitourinary: Negative.   ?Musculoskeletal:  Negative for arthralgias, gait problem and myalgias.  ?Skin:  Positive for color change, rash and  wound.  ?Neurological:  Negative for dizziness and weakness.  ?Psychiatric/Behavioral:  Positive for confusion. Negative for dysphoric mood and sleep disturbance.   ? ?Immunization History  ?Administered Date

## 2022-01-31 ENCOUNTER — Other Ambulatory Visit (HOSPITAL_COMMUNITY): Payer: Self-pay | Admitting: Orthopedic Surgery

## 2022-02-03 ENCOUNTER — Encounter: Payer: Medicare Other | Admitting: Family

## 2022-02-04 ENCOUNTER — Encounter (HOSPITAL_COMMUNITY): Payer: Self-pay | Admitting: Orthopedic Surgery

## 2022-02-04 NOTE — Progress Notes (Signed)
Spoke with pt nurse, Mindi Junker at [facility]. Pt last dose was plavix was 5/12. Pre-op instructions given verbally and faxed to facility.  ?

## 2022-02-04 NOTE — Progress Notes (Signed)
? ?  Your procedure is scheduled on Thursday 5/18. Please report to [facility] Main Entrance "A" at 05:30 A.M., and check in at the Admitting office. Call this number if you have problems the morning of surgery: [phone] ? ? ?Remember: Do not eat after midnight the night before your surgery ? ?You may drink clear liquids until 04:30 AM the morning of your surgery.   ?Clear liquids allowed are: Water, Non-Citrus Juices (without pulp), Carbonated Beverages, Clear Tea, Black Coffee Only, and Gatorade ?  ?Medications to take morning of surgery with a sip of water include: ?acetaminophen (TYLENOL) if needed ?ALPRAZolam Prudy Feeler) if needed ?omeprazole (PRILOSEC)  ? ?nitroGLYCERIN (NITROSTAT) if needed ? ?** PLEASE check your blood sugar the morning of your surgery when you wake up and every 2 hours until you get to the Short Stay unit. ? ?If your blood sugar is less than 70 mg/dL, you will need to treat for low blood sugar: ?Do not take insulin. ?Treat a low blood sugar (less than 70 mg/dL) with ? cup of clear juice (cranberry or apple), 4 glucose tablets, OR glucose gel. ?Recheck blood sugar in 15 minutes after treatment (to make sure it is greater than 70 mg/dL). If your blood sugar is not greater than 70 mg/dL on recheck, call [phone] for further instructions. ? ?insulin glargine (LANTUS SOLOSTAR) ?5/17 (usually 20 units) but give 16 units - 80% usual dose ?5/18 none ? ?insulin lispro (HUMALOG) ?5/17 - AM usual, PM none ?5/18 - AM none; CBG >220, 1/2 normal dose  ? ?As of today, STOP taking any Aspirin (unless otherwise instructed by your surgeon), Aleve, Naproxen, Ibuprofen, Motrin, Advil, Goody's, BC's, all herbal medications, fish oil, and all vitamins. ? ?  ?The Morning of Surgery ?Do not wear jewelry, make-up or nail polish. ?Do not wear lotions, powders, or perfumes, or deodorant ?Do not bring valuables to the hospital. ?[facility] is not responsible for any belongings or valuables. ? ?If you are a smoker,  DO NOT Smoke 24 hours prior to surgery ? ?If you wear a CPAP at night please bring your mask the morning of surgery  ? ?Remember that you must have someone to transport you home after your surgery, and remain with you for 24 hours if you are discharged the same day. ? ?Please bring cases for contacts, glasses, hearing aids, dentures or bridgework because it cannot be worn into surgery.  ? ?Patients discharged the day of surgery will not be allowed to drive home.  ? ?Please shower the NIGHT BEFORE/MORNING OF SURGERY (use antibacterial soap like DIAL soap if possible). Wear comfortable clothes the morning of surgery. Oral Hygiene is also important to reduce your risk of infection.  Remember - BRUSH YOUR TEETH THE MORNING OF SURGERY WITH YOUR REGULAR TOOTHPASTE ? ?Patient denies shortness of breath, fever, cough and chest pain.  ? ? ?   ? ?

## 2022-02-06 ENCOUNTER — Inpatient Hospital Stay (HOSPITAL_COMMUNITY): Payer: Medicare Other | Admitting: Anesthesiology

## 2022-02-06 ENCOUNTER — Encounter (HOSPITAL_COMMUNITY)
Admission: RE | Disposition: A | Discharge status: Skilled Nursing Facility | Payer: Self-pay | Source: Skilled Nursing Facility | Attending: Orthopedic Surgery

## 2022-02-06 ENCOUNTER — Other Ambulatory Visit: Payer: Self-pay

## 2022-02-06 ENCOUNTER — Encounter (HOSPITAL_COMMUNITY): Payer: Self-pay | Admitting: Orthopedic Surgery

## 2022-02-06 ENCOUNTER — Inpatient Hospital Stay (HOSPITAL_COMMUNITY)
Admission: RE | Admit: 2022-02-06 | Discharge: 2022-02-07 | DRG: 617 | Disposition: A | Discharge status: Skilled Nursing Facility | Payer: Medicare Other | Source: Skilled Nursing Facility | Attending: Orthopedic Surgery | Admitting: Orthopedic Surgery

## 2022-02-06 DIAGNOSIS — E1169 Type 2 diabetes mellitus with other specified complication: Secondary | ICD-10-CM

## 2022-02-06 DIAGNOSIS — Z794 Long term (current) use of insulin: Secondary | ICD-10-CM

## 2022-02-06 DIAGNOSIS — M869 Osteomyelitis, unspecified: Secondary | ICD-10-CM | POA: Diagnosis not present

## 2022-02-06 DIAGNOSIS — E10311 Type 1 diabetes mellitus with unspecified diabetic retinopathy with macular edema: Secondary | ICD-10-CM | POA: Diagnosis present

## 2022-02-06 DIAGNOSIS — F419 Anxiety disorder, unspecified: Secondary | ICD-10-CM | POA: Diagnosis present

## 2022-02-06 DIAGNOSIS — Z7902 Long term (current) use of antithrombotics/antiplatelets: Secondary | ICD-10-CM

## 2022-02-06 DIAGNOSIS — I472 Ventricular tachycardia, unspecified: Secondary | ICD-10-CM | POA: Diagnosis present

## 2022-02-06 DIAGNOSIS — L97519 Non-pressure chronic ulcer of other part of right foot with unspecified severity: Secondary | ICD-10-CM | POA: Diagnosis present

## 2022-02-06 DIAGNOSIS — E1042 Type 1 diabetes mellitus with diabetic polyneuropathy: Secondary | ICD-10-CM | POA: Diagnosis present

## 2022-02-06 DIAGNOSIS — Z8262 Family history of osteoporosis: Secondary | ICD-10-CM | POA: Diagnosis not present

## 2022-02-06 DIAGNOSIS — Z880 Allergy status to penicillin: Secondary | ICD-10-CM

## 2022-02-06 DIAGNOSIS — M810 Age-related osteoporosis without current pathological fracture: Secondary | ICD-10-CM | POA: Diagnosis present

## 2022-02-06 DIAGNOSIS — I2510 Atherosclerotic heart disease of native coronary artery without angina pectoris: Secondary | ICD-10-CM | POA: Diagnosis present

## 2022-02-06 DIAGNOSIS — E10621 Type 1 diabetes mellitus with foot ulcer: Secondary | ICD-10-CM | POA: Diagnosis present

## 2022-02-06 DIAGNOSIS — E1051 Type 1 diabetes mellitus with diabetic peripheral angiopathy without gangrene: Secondary | ICD-10-CM | POA: Diagnosis present

## 2022-02-06 DIAGNOSIS — Z833 Family history of diabetes mellitus: Secondary | ICD-10-CM | POA: Diagnosis not present

## 2022-02-06 DIAGNOSIS — I252 Old myocardial infarction: Secondary | ICD-10-CM | POA: Diagnosis not present

## 2022-02-06 DIAGNOSIS — E1069 Type 1 diabetes mellitus with other specified complication: Principal | ICD-10-CM | POA: Diagnosis present

## 2022-02-06 DIAGNOSIS — H4010X0 Unspecified open-angle glaucoma, stage unspecified: Secondary | ICD-10-CM | POA: Diagnosis present

## 2022-02-06 DIAGNOSIS — Z87442 Personal history of urinary calculi: Secondary | ICD-10-CM | POA: Diagnosis not present

## 2022-02-06 DIAGNOSIS — Z8261 Family history of arthritis: Secondary | ICD-10-CM

## 2022-02-06 DIAGNOSIS — Z951 Presence of aortocoronary bypass graft: Secondary | ICD-10-CM

## 2022-02-06 DIAGNOSIS — Z86718 Personal history of other venous thrombosis and embolism: Secondary | ICD-10-CM

## 2022-02-06 DIAGNOSIS — Z8249 Family history of ischemic heart disease and other diseases of the circulatory system: Secondary | ICD-10-CM | POA: Diagnosis not present

## 2022-02-06 DIAGNOSIS — I447 Left bundle-branch block, unspecified: Secondary | ICD-10-CM | POA: Diagnosis present

## 2022-02-06 DIAGNOSIS — M86671 Other chronic osteomyelitis, right ankle and foot: Secondary | ICD-10-CM | POA: Diagnosis present

## 2022-02-06 DIAGNOSIS — M069 Rheumatoid arthritis, unspecified: Secondary | ICD-10-CM | POA: Diagnosis present

## 2022-02-06 DIAGNOSIS — Z79899 Other long term (current) drug therapy: Secondary | ICD-10-CM

## 2022-02-06 HISTORY — PX: AMPUTATION: SHX166

## 2022-02-06 LAB — GLUCOSE, CAPILLARY
Glucose-Capillary: 267 mg/dL — ABNORMAL HIGH (ref 70–99)
Glucose-Capillary: 249 mg/dL — ABNORMAL HIGH (ref 70–99)
Glucose-Capillary: 202 mg/dL — ABNORMAL HIGH (ref 70–99)
Glucose-Capillary: 134 mg/dL — ABNORMAL HIGH (ref 70–99)
Glucose-Capillary: 219 mg/dL — ABNORMAL HIGH (ref 70–99)
Glucose-Capillary: 256 mg/dL — ABNORMAL HIGH (ref 70–99)

## 2022-02-06 LAB — CBC
HCT: 31.7 % — ABNORMAL LOW (ref 36.0–46.0)
Hemoglobin: 10.2 g/dL — ABNORMAL LOW (ref 12.0–15.0)
MCH: 28.5 pg (ref 26.0–34.0)
MCHC: 32.2 g/dL (ref 30.0–36.0)
MCV: 88.5 fL (ref 80.0–100.0)
Platelets: 367 10*3/uL (ref 150–400)
RBC: 3.58 MIL/uL — ABNORMAL LOW (ref 3.87–5.11)
RDW: 13.2 % (ref 11.5–15.5)
WBC: 7.7 10*3/uL (ref 4.0–10.5)
nRBC: 0 % (ref 0.0–0.2)

## 2022-02-06 LAB — BASIC METABOLIC PANEL
Anion gap: 9 (ref 5–15)
BUN: 24 mg/dL — ABNORMAL HIGH (ref 8–23)
CO2: 27 mmol/L (ref 22–32)
Calcium: 9.4 mg/dL (ref 8.9–10.3)
Chloride: 101 mmol/L (ref 98–111)
Creatinine, Ser: 0.79 mg/dL (ref 0.44–1.00)
GFR, Estimated: >60 mL/min (ref >60)
Glucose, Bld: 277 mg/dL — ABNORMAL HIGH (ref 70–99)
Potassium: 4.3 mmol/L (ref 3.5–5.1)
Sodium: 137 mmol/L (ref 135–145)

## 2022-02-06 SURGERY — AMPUTATION BELOW KNEE
Anesthesia: General | Site: Knee | Laterality: Right

## 2022-02-06 MED ORDER — 0.9 % SODIUM CHLORIDE (POUR BTL) OPTIME
TOPICAL | Status: DC | PRN
  Administered 2022-02-06: 1000 mL

## 2022-02-06 MED ORDER — DEXAMETHASONE SODIUM PHOSPHATE 10 MG/ML IJ SOLN
INTRAMUSCULAR | Status: DC | PRN
  Administered 2022-02-06: 4 mg via INTRAVENOUS

## 2022-02-06 MED ORDER — INSULIN ASPART 100 UNIT/ML IJ SOLN
INTRAMUSCULAR | Status: AC
  Administered 2022-02-06: 2 [IU] via SUBCUTANEOUS
  Filled 2022-02-06: qty 1

## 2022-02-06 MED ORDER — OXYCODONE HCL 5 MG PO TABS: 5.0000 mg | ORAL_TABLET | ORAL | Status: DC | PRN

## 2022-02-06 MED ORDER — LABETALOL HCL 5 MG/ML IV SOLN
5.0000 mg | Freq: Once | INTRAVENOUS | Status: AC
  Administered 2022-02-06: 5 mg via INTRAVENOUS

## 2022-02-06 MED ORDER — SODIUM CHLORIDE 0.9 % IV SOLN: INTRAVENOUS | Status: DC

## 2022-02-06 MED ORDER — LATANOPROST 0.005 % OP SOLN
1.0000 [drp] | Freq: Every evening | OPHTHALMIC | Status: DC
  Administered 2022-02-06: 1 [drp] via OPHTHALMIC
  Filled 2022-02-06: qty 2.5

## 2022-02-06 MED ORDER — FENTANYL CITRATE (PF) 250 MCG/5ML IJ SOLN
INTRAMUSCULAR | Status: AC
  Filled 2022-02-06: qty 5

## 2022-02-06 MED ORDER — METOPROLOL TARTRATE 5 MG/5ML IV SOLN: 2.0000 mg | INTRAVENOUS | Status: DC | PRN

## 2022-02-06 MED ORDER — ORAL CARE MOUTH RINSE: 15.0000 mL | Freq: Once | OROMUCOSAL | Status: AC

## 2022-02-06 MED ORDER — INSULIN ASPART 100 UNIT/ML IJ SOLN
4.0000 [IU] | Freq: Three times a day (TID) | INTRAMUSCULAR | Status: DC
  Administered 2022-02-06 – 2022-02-07 (×3): 4 [IU] via SUBCUTANEOUS

## 2022-02-06 MED ORDER — POTASSIUM CHLORIDE CRYS ER 20 MEQ PO TBCR: 20.0000 meq | EXTENDED_RELEASE_TABLET | Freq: Every day | ORAL | Status: DC | PRN

## 2022-02-06 MED ORDER — LIDOCAINE 2% (20 MG/ML) 5 ML SYRINGE
INTRAMUSCULAR | Status: AC
  Filled 2022-02-06: qty 5

## 2022-02-06 MED ORDER — LABETALOL HCL 5 MG/ML IV SOLN
10.0000 mg | INTRAVENOUS | Status: DC | PRN
  Administered 2022-02-07: 10 mg via INTRAVENOUS
  Filled 2022-02-06: qty 4

## 2022-02-06 MED ORDER — VANCOMYCIN HCL 500 MG IV SOLR
INTRAVENOUS | Status: AC
  Filled 2022-02-06: qty 10

## 2022-02-06 MED ORDER — ACETAMINOPHEN 10 MG/ML IV SOLN: 1000.0000 mg | Freq: Once | INTRAVENOUS | Status: DC | PRN

## 2022-02-06 MED ORDER — TIMOLOL MALEATE 0.5 % OP SOLN
1.0000 [drp] | Freq: Two times a day (BID) | OPHTHALMIC | Status: DC
  Administered 2022-02-06 – 2022-02-07 (×2): 1 [drp] via OPHTHALMIC
  Filled 2022-02-06: qty 5

## 2022-02-06 MED ORDER — PHENYLEPHRINE 80 MCG/ML (10ML) SYRINGE FOR IV PUSH (FOR BLOOD PRESSURE SUPPORT)
PREFILLED_SYRINGE | INTRAVENOUS | Status: DC | PRN
  Administered 2022-02-06: 80 ug via INTRAVENOUS

## 2022-02-06 MED ORDER — LIDOCAINE 2% (20 MG/ML) 5 ML SYRINGE
INTRAMUSCULAR | Status: DC | PRN
Start: 2022-02-06 — End: 2022-02-06
  Administered 2022-02-06: 40 mg via INTRAVENOUS

## 2022-02-06 MED ORDER — HYDRALAZINE HCL 20 MG/ML IJ SOLN: 5.0000 mg | INTRAMUSCULAR | Status: DC | PRN

## 2022-02-06 MED ORDER — INSULIN ASPART 100 UNIT/ML IJ SOLN: 0.0000 [IU] | Freq: Every day | INTRAMUSCULAR | Status: DC

## 2022-02-06 MED ORDER — HYDROMORPHONE HCL 2 MG PO TABS: 2.0000 mg | ORAL_TABLET | ORAL | Status: DC | PRN

## 2022-02-06 MED ORDER — SENNOSIDES-DOCUSATE SODIUM 8.6-50 MG PO TABS: 1.0000 | ORAL_TABLET | Freq: Every evening | ORAL | Status: DC | PRN

## 2022-02-06 MED ORDER — INSULIN DETEMIR 100 UNIT/ML ~~LOC~~ SOLN
10.0000 [IU] | Freq: Two times a day (BID) | SUBCUTANEOUS | Status: DC
  Administered 2022-02-06 – 2022-02-07 (×3): 10 [IU] via SUBCUTANEOUS
  Filled 2022-02-06 (×4): qty 0.1

## 2022-02-06 MED ORDER — DOCUSATE SODIUM 100 MG PO CAPS
100.0000 mg | ORAL_CAPSULE | Freq: Every day | ORAL | Status: DC
Start: 2022-02-07 — End: 2022-02-06

## 2022-02-06 MED ORDER — BUPIVACAINE-EPINEPHRINE (PF) 0.5% -1:200000 IJ SOLN
INTRAMUSCULAR | Status: DC | PRN
Start: 2022-02-06 — End: 2022-02-06
  Administered 2022-02-06: 15 mL via PERINEURAL

## 2022-02-06 MED ORDER — ONDANSETRON HCL 4 MG/2ML IJ SOLN
INTRAMUSCULAR | Status: DC | PRN
Start: 2022-02-06 — End: 2022-02-06
  Administered 2022-02-06: 4 mg via INTRAVENOUS

## 2022-02-06 MED ORDER — CHLORHEXIDINE GLUCONATE 0.12 % MT SOLN
OROMUCOSAL | Status: AC
  Administered 2022-02-06: 15 mL via OROMUCOSAL
  Filled 2022-02-06: qty 15

## 2022-02-06 MED ORDER — INSULIN ASPART 100 UNIT/ML IJ SOLN
0.0000 [IU] | Freq: Three times a day (TID) | INTRAMUSCULAR | Status: DC
  Administered 2022-02-06: 1 [IU] via SUBCUTANEOUS
  Administered 2022-02-06: 3 [IU] via SUBCUTANEOUS
  Administered 2022-02-07: 2 [IU] via SUBCUTANEOUS

## 2022-02-06 MED ORDER — CEFAZOLIN SODIUM-DEXTROSE 2-4 GM/100ML-% IV SOLN
INTRAVENOUS | Status: AC
  Filled 2022-02-06: qty 100

## 2022-02-06 MED ORDER — PHENYLEPHRINE 80 MCG/ML (10ML) SYRINGE FOR IV PUSH (FOR BLOOD PRESSURE SUPPORT)
PREFILLED_SYRINGE | INTRAVENOUS | Status: AC
  Filled 2022-02-06: qty 10

## 2022-02-06 MED ORDER — ONDANSETRON HCL 4 MG/2ML IJ SOLN: 4.0000 mg | Freq: Four times a day (QID) | INTRAMUSCULAR | Status: DC | PRN

## 2022-02-06 MED ORDER — LACTATED RINGERS IV SOLN: INTRAVENOUS | Status: DC

## 2022-02-06 MED ORDER — PANTOPRAZOLE SODIUM 40 MG PO TBEC
40.0000 mg | DELAYED_RELEASE_TABLET | Freq: Every day | ORAL | Status: DC
  Administered 2022-02-06 – 2022-02-07 (×2): 40 mg via ORAL
  Filled 2022-02-06: qty 1

## 2022-02-06 MED ORDER — BRIMONIDINE TARTRATE 0.2 % OP SOLN
1.0000 [drp] | Freq: Three times a day (TID) | OPHTHALMIC | Status: DC
  Administered 2022-02-06 – 2022-02-07 (×2): 1 [drp] via OPHTHALMIC
  Filled 2022-02-06 (×2): qty 5

## 2022-02-06 MED ORDER — ACETAMINOPHEN 160 MG/5ML PO SOLN: 325.0000 mg | ORAL | Status: DC | PRN

## 2022-02-06 MED ORDER — DOCUSATE SODIUM 100 MG PO CAPS
100.0000 mg | ORAL_CAPSULE | Freq: Two times a day (BID) | ORAL | Status: DC
  Administered 2022-02-06 – 2022-02-07 (×3): 100 mg via ORAL
  Filled 2022-02-06 (×3): qty 1

## 2022-02-06 MED ORDER — ACETAMINOPHEN 325 MG PO TABS: 325.0000 mg | ORAL_TABLET | ORAL | Status: DC | PRN

## 2022-02-06 MED ORDER — INSULIN ASPART 100 UNIT/ML IJ SOLN
0.0000 [IU] | INTRAMUSCULAR | Status: AC | PRN
  Administered 2022-02-06: 3 [IU] via SUBCUTANEOUS

## 2022-02-06 MED ORDER — CHLORHEXIDINE GLUCONATE 0.12 % MT SOLN: 15.0000 mL | Freq: Once | OROMUCOSAL | Status: AC

## 2022-02-06 MED ORDER — ACETAMINOPHEN 325 MG PO TABS: 325.0000 mg | ORAL_TABLET | Freq: Four times a day (QID) | ORAL | Status: DC | PRN

## 2022-02-06 MED ORDER — INSULIN ASPART 100 UNIT/ML IJ SOLN: 0.0000 [IU] | Freq: Three times a day (TID) | INTRAMUSCULAR | Status: DC

## 2022-02-06 MED ORDER — OXYCODONE HCL 5 MG PO TABS
5.0000 mg | ORAL_TABLET | ORAL | Status: DC | PRN
  Administered 2022-02-06 – 2022-02-07 (×2): 5 mg via ORAL
  Administered 2022-02-07: 10 mg via ORAL
  Filled 2022-02-06: qty 1
  Filled 2022-02-06 (×2): qty 2

## 2022-02-06 MED ORDER — LABETALOL HCL 5 MG/ML IV SOLN
INTRAVENOUS | Status: AC
  Filled 2022-02-06: qty 4

## 2022-02-06 MED ORDER — CEFAZOLIN SODIUM-DEXTROSE 2-4 GM/100ML-% IV SOLN
2.0000 g | INTRAVENOUS | Status: AC
  Administered 2022-02-06: 2 g via INTRAVENOUS

## 2022-02-06 MED ORDER — DIPHENHYDRAMINE HCL 12.5 MG/5ML PO ELIX: 12.5000 mg | ORAL_SOLUTION | ORAL | Status: DC | PRN

## 2022-02-06 MED ORDER — INSULIN LISPRO (1 UNIT DIAL) 100 UNIT/ML (KWIKPEN): 5.0000 [IU] | PEN_INJECTOR | SUBCUTANEOUS | Status: DC

## 2022-02-06 MED ORDER — PROPOFOL 10 MG/ML IV BOLUS
INTRAVENOUS | Status: DC | PRN
  Administered 2022-02-06: 120 mg via INTRAVENOUS

## 2022-02-06 MED ORDER — VANCOMYCIN HCL 500 MG IV SOLR
INTRAVENOUS | Status: DC | PRN
  Administered 2022-02-06: 500 mg

## 2022-02-06 MED ORDER — ONDANSETRON HCL 4 MG/2ML IJ SOLN
INTRAMUSCULAR | Status: AC
  Filled 2022-02-06: qty 2

## 2022-02-06 MED ORDER — LISINOPRIL 5 MG PO TABS
2.5000 mg | ORAL_TABLET | Freq: Every morning | ORAL | Status: DC
  Administered 2022-02-07: 2.5 mg via ORAL
  Filled 2022-02-06: qty 1

## 2022-02-06 MED ORDER — PHENOL 1.4 % MT LIQD: 1.0000 | OROMUCOSAL | Status: DC | PRN

## 2022-02-06 MED ORDER — ATORVASTATIN CALCIUM 40 MG PO TABS
40.0000 mg | ORAL_TABLET | Freq: Every day | ORAL | Status: DC
  Administered 2022-02-06: 40 mg via ORAL
  Filled 2022-02-06: qty 1

## 2022-02-06 MED ORDER — HYDROMORPHONE HCL 1 MG/ML IJ SOLN: 0.5000 mg | INTRAMUSCULAR | Status: DC | PRN

## 2022-02-06 MED ORDER — FUROSEMIDE 20 MG PO TABS
20.0000 mg | ORAL_TABLET | Freq: Every morning | ORAL | Status: DC
  Administered 2022-02-07: 20 mg via ORAL
  Filled 2022-02-06: qty 1

## 2022-02-06 MED ORDER — DEXAMETHASONE SODIUM PHOSPHATE 10 MG/ML IJ SOLN
INTRAMUSCULAR | Status: AC
  Filled 2022-02-06: qty 1

## 2022-02-06 MED ORDER — MAGNESIUM SULFATE 2 GM/50ML IV SOLN: 2.0000 g | Freq: Every day | INTRAVENOUS | Status: DC | PRN

## 2022-02-06 MED ORDER — ALUM & MAG HYDROXIDE-SIMETH 200-200-20 MG/5ML PO SUSP: 15.0000 mL | ORAL | Status: DC | PRN

## 2022-02-06 MED ORDER — FENTANYL CITRATE (PF) 100 MCG/2ML IJ SOLN
INTRAMUSCULAR | Status: DC | PRN
  Administered 2022-02-06 (×2): 50 ug via INTRAVENOUS

## 2022-02-06 MED ORDER — ONDANSETRON HCL 4 MG PO TABS: 4.0000 mg | ORAL_TABLET | Freq: Four times a day (QID) | ORAL | Status: DC | PRN

## 2022-02-06 MED ORDER — ROPIVACAINE HCL 5 MG/ML IJ SOLN
INTRAMUSCULAR | Status: DC | PRN
  Administered 2022-02-06: 10 mL via PERINEURAL

## 2022-02-06 MED ORDER — SENNA 8.6 MG PO TABS
1.0000 | ORAL_TABLET | Freq: Two times a day (BID) | ORAL | Status: DC
  Administered 2022-02-06 – 2022-02-07 (×3): 8.6 mg via ORAL
  Filled 2022-02-06 (×3): qty 1

## 2022-02-06 MED ORDER — NITROGLYCERIN 0.4 MG SL SUBL: 0.4000 mg | SUBLINGUAL_TABLET | SUBLINGUAL | Status: DC | PRN

## 2022-02-06 MED ORDER — AMISULPRIDE (ANTIEMETIC) 5 MG/2ML IV SOLN: 10.0000 mg | Freq: Once | INTRAVENOUS | Status: DC | PRN

## 2022-02-06 MED ORDER — GUAIFENESIN-DM 100-10 MG/5ML PO SYRP: 15.0000 mL | ORAL_SOLUTION | ORAL | Status: DC | PRN

## 2022-02-06 MED ORDER — CLOPIDOGREL BISULFATE 75 MG PO TABS
75.0000 mg | ORAL_TABLET | Freq: Every morning | ORAL | Status: DC
  Administered 2022-02-07: 75 mg via ORAL
  Filled 2022-02-06: qty 1

## 2022-02-06 MED ORDER — PANTOPRAZOLE SODIUM 40 MG PO TBEC
40.0000 mg | DELAYED_RELEASE_TABLET | Freq: Every day | ORAL | Status: DC
  Filled 2022-02-06: qty 1

## 2022-02-06 MED ORDER — FENTANYL CITRATE (PF) 100 MCG/2ML IJ SOLN: 25.0000 ug | INTRAMUSCULAR | Status: DC | PRN

## 2022-02-06 SURGICAL SUPPLY — 41 items
APL PRP STRL LF DISP 70% ISPRP (MISCELLANEOUS) ×1
BAG COUNTER SPONGE SURGICOUNT (BAG) ×2 IMPLANT
BAG SPNG CNTER NS LX DISP (BAG) ×1
BLADE SAW RECIP 87.9 MT (BLADE) ×2 IMPLANT
BNDG COHESIVE 6X5 TAN STRL LF (GAUZE/BANDAGES/DRESSINGS) ×2 IMPLANT
CHLORAPREP W/TINT 26 (MISCELLANEOUS) ×2 IMPLANT
COVER SURGICAL LIGHT HANDLE (MISCELLANEOUS) ×2 IMPLANT
CUFF TOURN SGL QUICK 34 (TOURNIQUET CUFF) ×2
CUFF TRNQT CYL 34X4.125X (TOURNIQUET CUFF) ×1 IMPLANT
DRAPE INCISE IOBAN 66X45 STRL (DRAPES) ×2 IMPLANT
DRSG MEPITEL 4X7.2 (GAUZE/BANDAGES/DRESSINGS) ×2 IMPLANT
DRSG PAD ABDOMINAL 8X10 ST (GAUZE/BANDAGES/DRESSINGS) ×4 IMPLANT
ELECT REM PT RETURN 9FT ADLT (ELECTROSURGICAL) ×2
ELECTRODE REM PT RTRN 9FT ADLT (ELECTROSURGICAL) ×1 IMPLANT
GAUZE SPONGE 4X4 12PLY STRL (GAUZE/BANDAGES/DRESSINGS) ×2 IMPLANT
GLOVE BIO SURGEON STRL SZ8 (GLOVE) ×2 IMPLANT
GLOVE BIOGEL PI IND STRL 8 (GLOVE) ×2 IMPLANT
GLOVE BIOGEL PI INDICATOR 8 (GLOVE) ×2
GLOVE ECLIPSE 8.0 STRL XLNG CF (GLOVE) ×4 IMPLANT
GOWN STRL REUS W/ TWL LRG LVL3 (GOWN DISPOSABLE) ×1 IMPLANT
GOWN STRL REUS W/ TWL XL LVL3 (GOWN DISPOSABLE) ×1 IMPLANT
GOWN STRL REUS W/TWL LRG LVL3 (GOWN DISPOSABLE) ×2
GOWN STRL REUS W/TWL XL LVL3 (GOWN DISPOSABLE) ×2
KIT BASIN OR (CUSTOM PROCEDURE TRAY) ×2 IMPLANT
KIT TURNOVER KIT B (KITS) ×2 IMPLANT
NS IRRIG 1000ML POUR BTL (IV SOLUTION) ×2 IMPLANT
PACK ORTHO EXTREMITY (CUSTOM PROCEDURE TRAY) ×2 IMPLANT
PAD ARMBOARD 7.5X6 YLW CONV (MISCELLANEOUS) ×4 IMPLANT
PAD CAST 4YDX4 CTTN HI CHSV (CAST SUPPLIES) ×1 IMPLANT
PADDING CAST ABS 6INX4YD NS (CAST SUPPLIES) ×1
PADDING CAST ABS COTTON 6X4 NS (CAST SUPPLIES) IMPLANT
PADDING CAST COTTON 4X4 STRL (CAST SUPPLIES) ×2
PADDING CAST COTTON 6X4 STRL (CAST SUPPLIES) ×2 IMPLANT
SPONGE T-LAP 18X18 ~~LOC~~+RFID (SPONGE) ×1 IMPLANT
STOCKINETTE IMPERVIOUS LG (DRAPES) ×1 IMPLANT
SUT MNCRL AB 3-0 PS2 18 (SUTURE) ×4 IMPLANT
SUT PDS AB 1 CT  36 (SUTURE) ×2
SUT PDS AB 1 CT 36 (SUTURE) ×2 IMPLANT
SUT SILK 2 0 (SUTURE) ×2
SUT SILK 2-0 18XBRD TIE 12 (SUTURE) ×1 IMPLANT
TOWEL GREEN STERILE FF (TOWEL DISPOSABLE) ×2 IMPLANT

## 2022-02-06 NOTE — Anesthesia Preprocedure Evaluation (Addendum)
Anesthesia Evaluation  Patient identified by MRN, date of birth, ID band Patient awake    Reviewed: Allergy & Precautions, NPO status , Patient's Chart, lab work & pertinent test results  Airway Mallampati: I  TM Distance: >3 FB Neck ROM: Full    Dental  (+) Partial Upper, Dental Advisory Given   Pulmonary neg pulmonary ROS,    breath sounds clear to auscultation       Cardiovascular hypertension, Pt. on medications + CAD, + CABG and + Peripheral Vascular Disease  + Valvular Problems/Murmurs  Rhythm:Regular Rate:Normal     Neuro/Psych Anxiety negative neurological ROS     GI/Hepatic Neg liver ROS, GERD  Medicated,  Endo/Other  diabetes, Type 2, Insulin Dependent  Renal/GU negative Renal ROS     Musculoskeletal  (+) Arthritis ,   Abdominal Normal abdominal exam  (+)   Peds  Hematology negative hematology ROS (+)   Anesthesia Other Findings   Reproductive/Obstetrics                           Anesthesia Physical Anesthesia Plan  ASA: 3  Anesthesia Plan: General   Post-op Pain Management: Regional block*   Induction:   PONV Risk Score and Plan: 4 or greater and Ondansetron, Dexamethasone, Treatment may vary due to age or medical condition and Midazolam  Airway Management Planned: LMA  Additional Equipment: None  Intra-op Plan:   Post-operative Plan: Extubation in OR  Informed Consent: I have reviewed the patients History and Physical, chart, labs and discussed the procedure including the risks, benefits and alternatives for the proposed anesthesia with the patient or authorized representative who has indicated his/her understanding and acceptance.     Dental advisory given  Plan Discussed with: CRNA  Anesthesia Plan Comments:        Anesthesia Quick Evaluation

## 2022-02-06 NOTE — Anesthesia Procedure Notes (Signed)
Anesthesia Regional Block: Adductor canal block   Pre-Anesthetic Checklist: , timeout performed,  Correct Patient, Correct Site, Correct Laterality,  Correct Procedure, Correct Position, site marked,  Risks and benefits discussed,  Surgical consent,  Pre-op evaluation,  At surgeon's request and post-op pain management  Laterality: Right  Prep: chloraprep       Needles:  Injection technique: Single-shot  Needle Type: Echogenic Stimulator Needle     Needle Length: 9cm  Needle Gauge: 21     Additional Needles:   Procedures:,,,, ultrasound used (permanent image in chart),,    Narrative:  Start time: 02/06/2022 7:28 AM End time: 02/06/2022 7:30 AM Injection made incrementally with aspirations every 5 mL.  Performed by: Personally  Anesthesiologist: Effie Berkshire, MD  Additional Notes: Patient tolerated the procedure well. Local anesthetic introduced in an incremental fashion under minimal resistance after negative aspirations. No paresthesias were elicited. After completion of the procedure, no acute issues were identified and patient continued to be monitored by RN.

## 2022-02-06 NOTE — Transfer of Care (Signed)
Immediate Anesthesia Transfer of Care Note  Patient: Caitlin Sharp  Procedure(s) Performed: AMPUTATION BELOW KNEE (Right: Knee)  Patient Location: PACU  Anesthesia Type:GA combined with regional for post-op pain  Level of Consciousness: awake, alert  and oriented  Airway & Oxygen Therapy: Patient Spontanous Breathing and Patient connected to face mask oxygen  Post-op Assessment: Report given to RN and Post -op Vital signs reviewed and stable  Post vital signs: Reviewed and stable  Last Vitals:  Vitals Value Taken Time  BP 178/73 02/06/22 0842  Temp    Pulse 83 02/06/22 0844  Resp 8 02/06/22 0844  SpO2 100 % 02/06/22 0844  Vitals shown include unvalidated device data.  Last Pain:  Vitals:   02/06/22 0626  TempSrc: Oral  PainSc: 0-No pain         Complications: No notable events documented.

## 2022-02-06 NOTE — Op Note (Signed)
02/06/2022  8:52 AM  PATIENT:  Caitlin Sharp  71 y.o. female  PRE-OPERATIVE DIAGNOSIS:  osteomyelitis Right foot  POST-OPERATIVE DIAGNOSIS:  osteomyelitis Right foot  Procedure(s): Right below-knee amputation  SURGEON:  Wylene Simmer, MD  ASSISTANT: Mechele Claude, PA-C  ANESTHESIA:   General, regional  EBL:  minimal   TOURNIQUET:   Total Tourniquet Time Documented: Thigh (Right) - 33 minutes Total: Thigh (Right) - 33 minutes  COMPLICATIONS:  None apparent  DISPOSITION:  Extubated, awake and stable to recovery.  INDICATION FOR PROCEDURE: 71 year old female with a long history of diabetes complicated by peripheral neuropathy.  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  She has failed nonoperative treatment to date including IV antibiotics.  We have discussed the risks and benefits of amputation versus chronic suppression.  She would like to proceed with amputation below the knee.  The risks and benefits of the alternative treatment options have been discussed in detail.  The patient wishes to proceed with surgery and specifically understands risks of bleeding, infection, nerve damage, blood clots, need for additional surgery, amputation and death.   PROCEDURE IN DETAIL:  After pre operative consent was obtained, and the correct operative site was identified, the patient was brought to the operating room and placed supine on the OR table.  Anesthesia was administered.  Pre-operative antibiotics were administered.  A surgical timeout was taken.  The right lower extremity was prepped and draped in standard sterile fashion with a tourniquet around the thigh.  The extremity was elevated and the tourniquet was inflated to 350 mmHg.  A posterior flap incision was marked on the skin 15 cm distal from the medial joint line.  The incision was made and dissection carried down through the subcutaneous tissues to the anterior tibia.  The  periosteum was incised and elevated.  The soft tissue's were protected.  The reciprocating saw was used to cut through the tibia.  The fibula was then identified.  Soft tissues were protected.  The fibula was cut 1 cm proximal from the tibial cut.  The distal portion of the leg was retracted anteriorly.  An amputation knife was used to cut through the posterior soft tissues creating a full-thickness flap posteriorly.  The flap was beveled appropriately.  The leg was passed off the field.  The wound was irrigated copiously.  The cut surface of the tibial bone was smoothed with a rasp.  The named vascular structures were suture ligated with 2-0 silk ties.  Named nerves were cut while held on gentle traction.  The wound was again irrigated copiously.  500 mg of vancomycin powder were sprinkled over the deep layer of the wound.  The gastrocnemius fascia was repaired to the anterior tibial periosteum with imbricating sutures of #1 PDS.  The deep fascia was repaired with simple and figure-of-eight sutures of #1 PDS.  Subcutaneous tissues were approximated with inverted simple sutures of 3-0 Monocryl.  The skin incision was closed with running 2-0 nylon.  Sterile dressings were applied followed by a compression wrap.  The tourniquet was released after application of the dressings.  The patient was awakened from anesthesia and transported to the recovery room in stable condition.  FOLLOW UP PLAN: The patient will be admitted for this inpatient only procedure.  We will get a stump protector for her.  Plan [facility] morning for postoperative rehab.   RADIOGRAPHS:    Mechele Claude PA-C was  present and scrubbed for the duration of the operative case. His assistance was essential in positioning the patient, prepping and draping, gaining and maintaining exposure, performing the operation, closing and dressing the wounds and applying the splint.

## 2022-02-06 NOTE — Progress Notes (Signed)
Orthopedic Tech Progress Note Patient Details:  Caitlin Sharp [birth year] [id number]  Patient ID: Jorje Guild, female   DOB: 06/07/1951, 71 y.o.   MRN: [id number] Called order into [facility]anger. Trinna Post 02/06/2022, 12:23 PM

## 2022-02-06 NOTE — Anesthesia Procedure Notes (Signed)
Anesthesia Regional Block: Popliteal block   Pre-Anesthetic Checklist: , timeout performed,  Correct Patient, Correct Site, Correct Laterality,  Correct Procedure, Correct Position, site marked,  Risks and benefits discussed,  Surgical consent,  Pre-op evaluation,  At surgeon's request and post-op pain management  Laterality: Right  Prep: chloraprep       Needles:  Injection technique: Single-shot  Needle Type: Echogenic Stimulator Needle     Needle Length: 9cm  Needle Gauge: 21     Additional Needles:   Procedures:,,,, ultrasound used (permanent image in chart),,    Narrative:  Start time: 02/06/2022 7:25 AM End time: 02/06/2022 7:28 AM Injection made incrementally with aspirations every 5 mL.  Performed by: Personally  Anesthesiologist: Effie Berkshire, MD  Additional Notes: Patient tolerated the procedure well. Local anesthetic introduced in an incremental fashion under minimal resistance after negative aspirations. No paresthesias were elicited. After completion of the procedure, no acute issues were identified and patient continued to be monitored by RN.

## 2022-02-06 NOTE — Anesthesia Postprocedure Evaluation (Signed)
Anesthesia Post Note  Patient: Caitlin Sharp  Procedure(s) Performed: AMPUTATION BELOW KNEE (Right: Knee)     Patient location during evaluation: PACU Anesthesia Type: General and Regional Level of consciousness: awake and alert Pain management: pain level controlled Vital Signs Assessment: post-procedure vital signs reviewed and stable Respiratory status: spontaneous breathing, nonlabored ventilation, respiratory function stable and patient connected to nasal cannula oxygen Cardiovascular status: blood pressure returned to baseline and stable Postop Assessment: no apparent nausea or vomiting Anesthetic complications: no   No notable events documented.  Last Vitals:  Vitals:   02/06/22 1145 02/06/22 1157  BP: (!) 132/53 (!) 126/55  Pulse: 78 79  Resp: 15 13  Temp:  (P) 36.7 C  SpO2: 92% 94%    Last Pain:  Vitals:   02/06/22 0626  TempSrc: Oral  PainSc: 0-No pain                 Effie Berkshire

## 2022-02-06 NOTE — H&P (Signed)
Caitlin Sharp is an 71 y.o. female.   Chief Complaint: right foot osteomyelitis HPI:  71 y/o female with PMH of diabetes and CAD has a h/o right foot chronic osteomyelitis after 1st ray amputation.  She has poor healing of the wound at the medial foot as well as signs of chronic infection.  We have discussed risks and benefits of chronic suppression v. Surgical treatment.  She presents today for right below knee amputation in an effort to cure the infection.  Past Medical History:  Diagnosis Date   Abnormal thyroid blood test    Per Records recevied from [facility], Dr.Pearson   Acute deep vein thrombosis of right peroneal vein ([place]) 10/05/2020   RLE DVT   Anxiety    Arthritis    RA   Coronary artery disease    Diabetes type I [facility])    Per [facility] New Patient Packet   Diabetic retinopathy ([place])    Per [facility] New Patient Packet   DKA (diabetic ketoacidosis) ([place]) 2021   Dr.William Zackowski.  Insulin Pump was out of Insulin   Glaucoma    Per [place] Patient Packet   H/O heart bypass surgery 2012   Dr. Luana Shu, Per [facility] New Patient Packet   Heart murmur    when I was a teenager   History of blood transfusion    History of kidney stones    passed   Hx of CABG    Per records received form [facility], Dr.Pearson    Left bundle branch block    Mild mitral regurgitation    Per records from [facility], [place]    Neuropathy 1976   Per [place] Patient Packet   NSTEMI (non-ST elevated myocardial infarction) ([place]) 09/21/2020   NSTEMI/demand ischemic in setting of DKA, patent grafts by 09/24/20 [facility], continue medical therapy   Osteoporosis    T Score -2.7 , Per [place] New Patient Packet   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 due to underlying condition [facility])    [facility]Pearson   PVD (peripheral vascular disease) ([place]) 10/27/2021   Type 1 diabetes mellitus with hyperglycemia ([place]) 10/28/2021    Past Surgical History:   Procedure Laterality Date   ABDOMINAL AORTOGRAM W/LOWER EXTREMITY N/A 10/02/2021   Procedure: ABDOMINAL AORTOGRAM W/LOWER EXTREMITY;  Surgeon: Broadus Kharson Rasmusson, MD;  Location: [place] CV [facility];  Service: Cardiovascular;  Laterality: N/A;   AMPUTATION Right 04/17/2021   Procedure: RIGHT GREAT TOE AMPUTATION;  Surgeon: Newt Minion, MD;  Location: [place];  Service: Orthopedics;  Laterality: Right;   AMPUTATION Right 07/19/2021   Procedure: RIGHT FOOT 1ST RAY AMPUTATION;  Surgeon: Newt Minion, MD;  Location: [place];  Service: Orthopedics;  Laterality: Right;   CESAREAN SECTION  1970   Dr. Moses Manners, Per [facility] New Patient Packet   CESAREAN SECTION  1973   Dr. Moses Manners, Per [facility] New Patient Packet   CORONARY ARTERY BYPASS GRAFT      Family History  Problem Relation Age of Onset   Congestive Heart Failure Mother        Per [facility] New Patient Packet   Arthritis Mother        Per [facility] New Patient Packet   Osteoporosis Mother        Per [facility] New Patient Packet   Heart attack Father        Per [place] Patient Packet   Diabetes Sister  Per [facility] New Patient Packet   Emphysema Brother        Per [facility] New Patient Packet   Heart disease Brother        Per [facility] New Patient Packet   Social History:  reports that she has never smoked. She has never used smokeless tobacco. She reports that she does not drink alcohol and does not use drugs.  Allergies:  Allergies  Allergen Reactions   Penicillins Anaphylaxis    Medications Prior to Admission  Medication Sig Dispense Refill   atorvastatin (LIPITOR) 40 MG tablet Take 40 mg by mouth at bedtime.     Biotin w/ Vitamins C & E (HAIR/SKIN/NAILS PO) Take 1 tablet by mouth in the morning.     brimonidine (ALPHAGAN) 0.2 % ophthalmic solution Place 1 drop into the left eye 3 (three) times daily.     calcium carbonate (OSCAL) 1500 (600 Ca) MG TABS tablet Take 600 mg of elemental calcium by mouth daily.     clopidogrel (PLAVIX) 75 MG tablet Take 75 mg  by mouth in the morning.     diclofenac Sodium (VOLTAREN) 1 % GEL Apply 1 application topically 3 (three) times daily as needed (applied to right leg when needed for pain).     furosemide (LASIX) 20 MG tablet Take 20 mg by mouth in the morning.     glucosamine-chondroitin 500-400 MG tablet Take 1 tablet by mouth every morning.     insulin glargine (LANTUS SOLOSTAR) 100 UNIT/ML Solostar Pen Inject 20 Units into the skin at bedtime. PATIENT SELF ADMINISTERS     insulin lispro (HUMALOG) 100 UNIT/ML KwikPen Inject 5-15 Units into the skin See admin instructions. If blood sugar is 151-200 = 6 units, 201-250 = 10 units, 251-300  = 12 units, 301-400 = 15 units with each meal. If blood sugar is 201-400 inject 5 units at bedtime     latanoprost (XALATAN) 0.005 % ophthalmic solution Place 1 drop into both eyes every evening.     lisinopril (ZESTRIL) 2.5 MG tablet Take 2.5 mg by mouth in the morning.     Multiple Vitamins-Minerals (I-VITE PO) Take 1 tablet by mouth in the morning.     nitroGLYCERIN (NITROSTAT) 0.4 MG SL tablet Place 0.4 mg under the tongue every 5 (five) minutes x 3 doses as needed for chest pain.     Omega-3 Fatty Acids (FISH OIL) 1000 MG CAPS Take 1,000 mg by mouth in the morning.     omeprazole (PRILOSEC) 20 MG capsule Take 20 mg by mouth daily.     sennosides-docusate sodium (SENOKOT-S) 8.6-50 MG tablet Take 1 tablet by mouth every other day. IN THE MORNING     timolol (TIMOPTIC) 0.5 % ophthalmic solution Place 1 drop into the left eye 2 (two) times daily.     acetaminophen (TYLENOL) 500 MG tablet Take 1,000 mg by mouth 2 (two) times daily as needed for moderate pain.     Continuous Blood Gluc Sensor (FREESTYLE LIBRE 14 DAY SENSOR) MISC Inject 1 sensor to the skin every 14 days for continuous glucose monitoring.     glucose blood test strip 1 each by Other route as needed for other. Accu-chek     Insulin Pen Needle (PEN NEEDLES 3/16") 31G X 5 MM MISC 1 Device by Does not apply route 5  (five) times daily. With insulin administration     miconazole (MICOTIN) 200 MG vaginal suppository Place 200 mg vaginally at bedtime as needed (vaginal itching).  Results for orders placed or performed during the hospital encounter of 08-Feb-2022 (from the past 48 hour(s))  Basic metabolic panel per protocol     Status: Abnormal   Collection Time: 08-Feb-2022  6:12 AM  Result Value Ref Range   Sodium 137 135 - 145 mmol/L   Potassium 4.3 3.5 - 5.1 mmol/L   Chloride 101 98 - 111 mmol/L   CO2 27 22 - 32 mmol/L   Glucose, Bld 277 (H) 70 - 99 mg/dL    Comment: Glucose reference range applies only to samples taken after fasting for at least 8 hours.   BUN 24 (H) 8 - 23 mg/dL   Creatinine, Ser 0.79 0.44 - 1.00 mg/dL   Calcium 9.4 8.9 - 10.3 mg/dL   GFR, Estimated >60 >60 mL/min    Comment: (NOTE) Calculated using the CKD-EPI Creatinine Equation (2021)    Anion gap 9 5 - 15    Comment: Performed at [facility] [address], [place], [place] [address]  CBC per protocol     Status: Abnormal   Collection Time: Feb 08, 2022  6:12 AM  Result Value Ref Range   WBC 7.7 4.0 - 10.5 K/uL   RBC 3.58 (L) 3.87 - 5.11 MIL/uL   Hemoglobin 10.2 (L) 12.0 - 15.0 g/dL   HCT 31.7 (L) 36.0 - 46.0 %   MCV 88.5 80.0 - 100.0 fL   MCH 28.5 26.0 - 34.0 pg   MCHC 32.2 30.0 - 36.0 g/dL   RDW 13.2 11.5 - 15.5 %   Platelets 367 150 - 400 K/uL   nRBC 0.0 0.0 - 0.2 %    Comment: Performed at [facility], [place] [address] [place], [place] [address]  Glucose, capillary     Status: Abnormal   Collection Time: Feb 08, 2022  6:23 AM  Result Value Ref Range   Glucose-Capillary 267 (H) 70 - 99 mg/dL    Comment: Glucose reference range applies only to samples taken after fasting for at least 8 hours.   No results found.  Review of Systems  no recent f/c/n/v/wt loss  Blood pressure (!) 185/74, pulse 88, temperature 98.5 F (36.9 C), temperature source Oral, resp. rate 18, height 5\' 7"  (1.702 m), weight 57.6  kg, SpO2 97 %. Physical Exam  77 wd elderly female in nad.  A and O x 4.  Normal mood and affect.  EOMI.  Resp unlabored.  R foot s/p 1st ray amputation.  Chronic nonhealing medial incision.  Foot is swollen.  Chronic edema and trophic changes of the skin around the leg.  Brisk cap refill at the foot.  Assessment/Plan R foot chronic diabetic ulcer and osteomyelitis.  The risks and benefits of the alternative treatment options have been discussed in detail.  The patient wishes to proceed with surgery and specifically understands risks of bleeding, infection, nerve damage, blood clots, need for additional surgery, amputation and death.   Wylene Simmer, MD 08-Feb-2022, 7:21 AM

## 2022-02-06 NOTE — Discharge Instructions (Signed)
Caitlin Simmer, MD [facility]  Please read the following information regarding your care after surgery.  Medications  You only need a prescription for the narcotic pain medicine (ex. oxycodone, Percocet, Norco).  All of the other medicines listed below are available over the counter. ? Aleve 2 pills twice a day for the first 3 days after surgery. ? acetominophen (Tylenol) 650 mg every 4-6 hours as you need for minor to moderate pain ? oxycodone as prescribed for severe pain  Narcotic pain medicine (ex. oxycodone, Percocet, Vicodin) will cause constipation.  To prevent this problem, take the following medicines while you are taking any pain medicine. ? docusate sodium (Colace) 100 mg twice a day ? senna (Senokot) 2 tablets twice a day  Weight Bearing ? Do not bear any weight on the operated leg or foot.  Cast / Splint / Dressing ? Keep your splint, cast or dressing clean and dry.  Don't put anything (coat hanger, pencil, etc) down inside of it.  If it gets damp, use a hair dryer on the cool setting to dry it.  If it gets soaked, call the office to schedule an appointment for a cast change.   After your dressing, cast or splint is removed; you may shower, but do not soak or scrub the wound.  Allow the water to run over it, and then gently pat it dry.  Swelling It is normal for you to have swelling where you had surgery.  To reduce swelling and pain, keep your toes above your nose for at least 3 days after surgery.  It may be necessary to keep your foot or leg elevated for several weeks.  If it hurts, it should be elevated.  Follow Up Call my office at [phone] when you are discharged from the hospital or surgery center to schedule an appointment to be seen two weeks after surgery.  Call my office at [phone] if you develop a fever >101.5 F, nausea, vomiting, bleeding from the surgical site or severe pain.

## 2022-02-06 NOTE — Progress Notes (Signed)
Inpatient Diabetes Program Recommendations  AACE/ADA: New Consensus Statement on Inpatient Glycemic Control (2015)  Target Ranges:  Prepandial:   less than 140 mg/dL      Peak postprandial:   less than 180 mg/dL (1-2 hours)      Critically ill patients:  140 - 180 mg/dL   Lab Results  Component Value Date   GLUCAP 202 (H) 02/06/2022   HGBA1C 12.7 01/24/2022    Review of Glycemic Control  Latest Reference Range & Units 02/06/22 08:00 02/06/22 08:45  Glucose-Capillary 70 - 99 mg/dL 249 (H) 202 (H)  (H): Data is abnormally high Diabetes history: Type 1 DM(basal/bolus) Outpatient Diabetes medications: Lantus 20 units QHS, Humalog 5-15 units TID Current orders for Inpatient glycemic control: none Decadron 4 mg x 1 Inpatient Diabetes Program Recommendations:    Consider -Levemir 10 units BID (to start now)  -Novolog 0-15 units TID & HS -Novolog 3 units TID (assuming pt is consuming >50% of meals).   Thanks, Bronson Curb, MSN, RNC-OB Diabetes Coordinator [phone] (8a-5p)

## 2022-02-06 NOTE — Anesthesia Procedure Notes (Signed)
Procedure Name: LMA Insertion Date/Time: 02/06/2022 7:46 AM Performed by: Pearson Grippeobertson, Johnnette Laux M, CRNA Pre-anesthesia Checklist: Patient identified, Emergency Drugs available, Suction available and Patient being monitored Patient Re-evaluated:Patient Re-evaluated prior to induction Oxygen Delivery Method: Circle system utilized Preoxygenation: Pre-oxygenation with 100% oxygen Induction Type: IV induction Ventilation: Mask ventilation without difficulty LMA: LMA inserted LMA Size: 4.0 Number of attempts: 1 Airway Equipment and Method: Bite block Placement Confirmation: positive ETCO2 Tube secured with: Tape Dental Injury: Teeth and Oropharynx as per pre-operative assessment

## 2022-02-07 ENCOUNTER — Encounter (HOSPITAL_COMMUNITY): Payer: Self-pay | Admitting: Orthopedic Surgery

## 2022-02-07 LAB — BASIC METABOLIC PANEL
Anion gap: 6 (ref 5–15)
BUN: 22 mg/dL (ref 8–23)
CO2: 27 mmol/L (ref 22–32)
Calcium: 8.5 mg/dL — ABNORMAL LOW (ref 8.9–10.3)
Chloride: 104 mmol/L (ref 98–111)
Creatinine, Ser: 0.86 mg/dL (ref 0.44–1.00)
GFR, Estimated: >60 mL/min (ref >60)
Glucose, Bld: 248 mg/dL — ABNORMAL HIGH (ref 70–99)
Potassium: 4.4 mmol/L (ref 3.5–5.1)
Sodium: 137 mmol/L (ref 135–145)

## 2022-02-07 LAB — GLUCOSE, CAPILLARY: Glucose-Capillary: 178 mg/dL — ABNORMAL HIGH (ref 70–99)

## 2022-02-07 LAB — CBC
HCT: 26.6 % — ABNORMAL LOW (ref 36.0–46.0)
Hemoglobin: 8.5 g/dL — ABNORMAL LOW (ref 12.0–15.0)
MCH: 28 pg (ref 26.0–34.0)
MCHC: 32 g/dL (ref 30.0–36.0)
MCV: 87.5 fL (ref 80.0–100.0)
Platelets: 325 10*3/uL (ref 150–400)
RBC: 3.04 MIL/uL — ABNORMAL LOW (ref 3.87–5.11)
RDW: 13.3 % (ref 11.5–15.5)
WBC: 7.6 10*3/uL (ref 4.0–10.5)
nRBC: 0 % (ref 0.0–0.2)

## 2022-02-07 MED ORDER — DOCUSATE SODIUM 100 MG PO CAPS: 100.0000 mg | ORAL_CAPSULE | Freq: Two times a day (BID) | ORAL | 0 refills | Status: DC

## 2022-02-07 MED ORDER — OXYCODONE HCL 5 MG PO TABS: 5.0000 mg | ORAL_TABLET | ORAL | 0 refills | Status: DC | PRN

## 2022-02-07 NOTE — TOC Transition Note (Signed)
Transition of Care [facility]) - CM/SW Discharge Note   Patient Details  Name: Caitlin Sharp MRN: [id number] Date of Birth: 12-Sep-1951  Transition of Care [facility]) CM/SW Contact:  Lorri Frederick, LCSW Phone Number: 02/07/2022, 10:37 AM   Clinical Narrative:  Pt discharging to [facility], room 154.  RN call report to [phone].     Final next level of care: Skilled Nursing Facility Barriers to Discharge: No Barriers Identified   Patient Goals and CMS Choice Patient states their goals for this hospitalization and ongoing recovery are:: get back to where I was before   Choice offered to / list presented to : Patient (Pt is from [facility] and wants to return)  Discharge Placement              Patient chooses bed at:  [facility]) Patient to be transferred to facility by: [facility] Name of family member notified: Left message with son Harrold Donath Patient and family notified of of transfer: 02/07/22  Discharge Plan and Services In-house Referral: Clinical Social Work   Post Acute Care Choice: Skilled Nursing Facility                               Social Determinants of Health (SDOH) Interventions     Readmission Risk Interventions     View : No data to display.

## 2022-02-07 NOTE — Progress Notes (Addendum)
Subjective: 1 Day Post-Op Procedure(s) (LRB): AMPUTATION BELOW KNEE (Right)  Patient reports pain as mild to moderate.  Denies fever, chills, N/V, CP, SOB.  Tolerating POs well.   Admits to flatus.  States that she has her stump protector and is ready to go home to [facility].  Objective:   VITALS:  Temp:  [97.6 F (36.4 C)-98.3 F (36.8 C)] 98.3 F (36.8 C) (05/19 0430) Pulse Rate:  [74-87] 75 (05/19 0615) Resp:  [8-31] 18 (05/19 0430) BP: (126-205)/(53-90) 149/68 (05/19 0615) SpO2:  [92 %-100 %] 97 % (05/19 0430)  General: WDWN patient in NAD. Psych:  Appropriate mood and affect. Neuro:  A&O x 3, Moving all extremities, sensation intact to light touch HEENT:  EOMs intact Chest:  Even non-labored respirations Skin:  Stump protector C/D/I, no rashes or lesions Extremities: warm/dry, no visible edema, erythema or echymosis.  No lymphadenopathy. Pulses: Femoral 2+ MSK:  ROM: TKE, MMT: able to perform quad set   LABS Recent Labs    02/06/22 0612 02/07/22 0222  HGB 10.2* 8.5*  WBC 7.7 7.6  PLT 367 325   Recent Labs    02/06/22 0612 02/07/22 0222  NA 137 137  K 4.3 4.4  CL 101 104  CO2 27 27  BUN 24* 22  CREATININE 0.79 0.86  GLUCOSE 277* 248*   No results for input(s): LABPT, INR in the last 72 hours.   Assessment/Plan: 1 Day Post-Op Procedure(s) (LRB): AMPUTATION BELOW KNEE (Right)  NWB R LE Stump protector on at all times. Plan to D/C home to [facility] today after working with PT. D/C order placed. D/C scripts on chart. After searching [place] PMP Aware the patient is provided a Rx for oxycodone. Resume plavix for DVT ppx today. Plan for 2 week outpatient post-op visit.  Alfredo Martinez PA-C [facility] Office:  [phone]

## 2022-02-07 NOTE — Evaluation (Signed)
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   Clinical Impression   PTA, pt lives at [facility], typically very active at baseline and values maintaining her independence. Pt presents now with minimal post op pain, expected endurance and balance deficits post op. Overall, pt able to demo transfers using RW at [place] A, Modified Independent for UB ADLs, and requires up to Mod A for LB ADLs due to deficits. Educated re: functional transfer options, use of w/c for longer distances, compensatory strategies for LB ADLs, and BKA/limb protector precautions. Pt eager to regain independence, reports her son has already assisting in setting up rehab at [facility]. Will follow acutely while admitted.       Recommendations for follow up therapy are one component of a multi-disciplinary discharge planning process, led by the attending physician.  Recommendations may be updated based on patient status, additional functional criteria and insurance authorization.   Follow Up Recommendations  Skilled nursing-short term rehab (<3 hours/day) (Pt reports rehab stay at [facility] already set up)    Assistance Recommended at Discharge Intermittent Supervision/Assistance  Patient can return home with the following A little help with walking and/or transfers;A little help with bathing/dressing/bathroom;Assistance with cooking/housework;Assist for transportation;Help with stairs or ramp for entrance    Functional Status Assessment  Patient has had a recent decline in their functional status and demonstrates the ability to make significant improvements in function in a reasonable and predictable amount of time.  Equipment  Recommendations  Wheelchair (measurements OT);Wheelchair cushion (measurements OT)    Recommendations for Other Services       Precautions / Restrictions Precautions Precautions: Fall Required Braces or Orthoses: Other Brace Other Brace: R limb protector Restrictions Weight Bearing Restrictions: Yes RLE Weight Bearing: Non weight bearing      Mobility Bed Mobility Overal bed mobility: Modified Independent             General bed mobility comments: increased time but pt determined to complete without assist    Transfers Overall transfer level: Needs assistance Equipment used: Rolling walker (2 wheels) Transfers: Sit to/from Stand Sit to Stand: Min assist           General transfer comment: Min A to min guard for sit to stand practice x 3-4 using RW at bedside. Pt able to demo shimmy-type steps along bedside, declined transfer to chair this AM      Balance Overall balance assessment: Needs assistance Sitting-balance support: No upper extremity supported, Feet supported Sitting balance-Leahy Scale: Fair     Standing balance support: Bilateral upper extremity supported, During functional activity Standing balance-Leahy Scale: Poor Standing balance comment: reliant on UE support due to NWB                           ADL either performed or assessed with clinical judgement   ADL Overall ADL's : Needs assistance/impaired Eating/Feeding: Independent;Sitting   Grooming: Set up;Sitting   Upper Body Bathing: Modified independent;Sitting   Lower Body Bathing: Minimal assistance;Sit to/from stand;Sitting/lateral leans   Upper Body Dressing : Modified independent;Sitting   Lower Body Dressing: Moderate assistance;Sitting/lateral leans;Sit to/from stand   Toilet Transfer: Minimal assistance;Stand-pivot;Rolling walker (2 wheels);BSC/3in1   Toileting-  Clothing Manipulation and Hygiene: Moderate assistance;Sitting/lateral lean;Sit to/from stand          General ADL Comments: Pt very motivated, initial balance deficits expected post op with pt aware and able to correct. Collab on LB dressing strategies, use of w/c for long distance mobility     Vision Baseline Vision/History: 1 Wears glasses;3 Glaucoma Ability to See in Adequate Light: 1 Impaired Patient Visual Report: No change from baseline Vision Assessment?: No apparent visual deficits Additional Comments: hx of glaucoma, burry vision and hindered by bright lights     Perception     Praxis      Pertinent Vitals/Pain Pain Assessment Pain Assessment: Faces Faces Pain Scale: Hurts a little bit Pain Location: R LE Pain Descriptors / Indicators: Guarding Pain Intervention(s): Monitored during session     Hand Dominance Right   Extremity/Trunk Assessment Upper Extremity Assessment Upper Extremity Assessment: Overall WFL for tasks assessed   Lower Extremity Assessment Lower Extremity Assessment: Defer to PT evaluation   Cervical / Trunk Assessment Cervical / Trunk Assessment: Normal   Communication Communication Communication: No difficulties   Cognition Arousal/Alertness: Awake/alert Behavior During Therapy: WFL for tasks assessed/performed Overall Cognitive Status: Within Functional Limits for tasks assessed                                       General Comments  SpO2 >93% on RA, HR 80s    Exercises     Shoulder Instructions      Home Living Family/patient expects to be discharged to:: Other (Comment) ([facility])                                        Prior Functioning/Environment Prior Level of Function : Independent/Modified Independent             Mobility Comments: pt reports typically no device needed prior to initial ray amputation, very active - using [facility] gym and going on walks ADLs Comments: Typically Independent with ADLs, basic IADLs        OT Problem List: Decreased strength;Decreased  activity tolerance;Impaired balance (sitting and/or standing)      OT Treatment/Interventions: Self-care/ADL training;Therapeutic exercise;DME and/or AE instruction;Therapeutic activities;Patient/family education;Balance training    OT Goals(Current goals can be found in the care plan section) Acute Rehab OT Goals Patient Stated Goal: optimal healing, return to independence OT Goal Formulation: With patient Time For Goal Achievement: 02/21/22 Potential to Achieve Goals: Good  OT Frequency: Min 2X/week    Co-evaluation              AM-PAC OT "6 Clicks" Daily Activity     Outcome Measure Help from another person eating meals?: None Help from another person taking care of personal grooming?: A Little Help from another person toileting, which includes using toliet, bedpan, or urinal?: A Lot Help from another person bathing (including washing, rinsing, drying)?: A Lot Help from another person to put on and taking off regular upper body clothing?: A Little Help from another person to put on and taking off regular lower body clothing?: A Lot 6 Click Score: 16   End of Session Equipment Utilized During Treatment: Rolling walker (2 wheels);Gait belt Nurse Communication: Mobility status;Weight bearing status;Precautions  Activity Tolerance: Patient tolerated treatment well Patient left: in bed;with call bell/phone within reach;with  bed alarm set;with nursing/sitter in room  OT Visit Diagnosis: Unsteadiness on feet (R26.81);Muscle weakness (generalized) (M62.81)                Time: 2130-8657 OT Time Calculation (min): 29 min Charges:  OT General Charges $OT Visit: 1 Visit OT Evaluation $OT Eval Low Complexity: 1 Low OT Treatments $Self Care/Home Management : 8-22 mins  Bradd Canary, OTR/L Acute Rehab Services Office: [phone]   Lorre Munroe 02/07/2022, 8:19 AM

## 2022-02-07 NOTE — Plan of Care (Signed)
  Problem: Activity: Goal: Ability to avoid complications of mobility impairment will improve Outcome: Progressing Goal: Ability to tolerate increased activity will improve Outcome: Progressing   Problem: Education: Goal: Verbalization of understanding the information provided will improve Outcome: Progressing   Problem: Coping: Goal: Level of anxiety will decrease Outcome: Progressing   Problem: Physical Regulation: Goal: Postoperative complications will be avoided or minimized Outcome: Progressing   Problem: Respiratory: Goal: Ability to maintain a clear airway will improve Outcome: Progressing   Problem: Pain Management: Goal: Pain level will decrease Outcome: Progressing   Problem: Skin Integrity: Goal: Signs of wound healing will improve Outcome: Progressing   Problem: Tissue Perfusion: Goal: Ability to maintain adequate tissue perfusion will improve Outcome: Progressing   Problem: Education: Goal: Knowledge of General Education information will improve Description: Including pain rating scale, medication(s)/side effects and non-pharmacologic comfort measures Outcome: Progressing   Problem: Health Behavior/Discharge Planning: Goal: Ability to manage health-related needs will improve Outcome: Progressing   Problem: Clinical Measurements: Goal: Ability to maintain clinical measurements within normal limits will improve Outcome: Progressing Goal: Will remain free from infection Outcome: Progressing Goal: Diagnostic test results will improve Outcome: Progressing Goal: Respiratory complications will improve Outcome: Progressing Goal: Cardiovascular complication will be avoided Outcome: Progressing   Problem: Activity: Goal: Risk for activity intolerance will decrease Outcome: Progressing   Problem: Nutrition: Goal: Adequate nutrition will be maintained Outcome: Progressing   Problem: Coping: Goal: Level of anxiety will decrease Outcome: Progressing    Problem: Elimination: Goal: Will not experience complications related to bowel motility Outcome: Progressing Goal: Will not experience complications related to urinary retention Outcome: Progressing   Problem: Pain Managment: Goal: General experience of comfort will improve Outcome: Progressing   Problem: Safety: Goal: Ability to remain free from injury will improve Outcome: Progressing   Problem: Skin Integrity: Goal: Risk for impaired skin integrity will decrease Outcome: Progressing   

## 2022-02-07 NOTE — TOC Initial Note (Signed)
Transition of Care [facility]) - Initial/Assessment Note    Patient Details  Name: Caitlin Sharp MRN: [id number] Date of Birth: 04/19/1951  [facility] CM/SW Contact:    Joanne Chars, LCSW Phone Number: 02/07/2022, 8:55 AM  Clinical Narrative:   CSW met with pt regarding DC plan.  Pt is from [facility] assisted living, reports arrangements have already been made to return to [facility] rehab unit.  Permission given to speak with [facility] and with son Ovid Curd.  PT has not yet seen pt but DC order is already in.    CSW spoke with [place] at [facility] who confirms this.  She needs DC summary and FL2, does not need passr, ready to receive pt this AM.                Expected Discharge Plan: Skilled Nursing Facility Barriers to Discharge: No Barriers Identified   Patient Goals and CMS Choice Patient states their goals for this hospitalization and ongoing recovery are:: get back to where I was before   Choice offered to / list presented to : Patient (Pt is from [place] and wants to return)  Expected Discharge Plan and Services Expected Discharge Plan: [place] In-house Referral: Clinical Social Work   Post Acute Care Choice: [place] Living arrangements for the past 2 months: [place] Expected Discharge Date: 02/07/22                                    Prior Living Arrangements/Services Living arrangements for the past 2 months: [place] Lives with:: Facility Resident Patient language and need for interpreter reviewed:: Yes Do you feel safe going back to the place where you live?: Yes      Need for Family Participation in Patient Care: No (Comment) Care giver support system in place?: Yes (comment) Current home services: Other (comment) (na) Criminal Activity/Legal Involvement Pertinent to Current Situation/Hospitalization: No - Comment as needed  Activities of Daily Living Home  Assistive Devices/Equipment: Built-in shower seat, Eyeglasses, Hand-held shower hose, Grab bars in shower, Grab bars around toilet, Raised toilet seat with rails, Walker (specify type) ADL Screening (condition at time of admission) Patient's cognitive ability adequate to safely complete daily activities?: Yes Is the patient deaf or have difficulty hearing?: Yes Does the patient have difficulty seeing, even when wearing glasses/contacts?: Yes Does the patient have difficulty concentrating, remembering, or making decisions?: No Patient able to express need for assistance with ADLs?: Yes Does the patient have difficulty dressing or bathing?: Yes Independently performs ADLs?: No Communication: Independent Dressing (OT): Needs assistance Is this a change from baseline?: Change from baseline, expected to last >3 days Grooming: Independent Feeding: Independent Bathing: Needs assistance Is this a change from baseline?: Change from baseline, expected to last >3 days Toileting: Needs assistance Is this a change from baseline?: Change from baseline, expected to last >3days In/Out Bed: Needs assistance Is this a change from baseline?: Change from baseline, expected to last >3 days Walks in Home: Needs assistance Is this a change from baseline?: Change from baseline, expected to last >3 days Does the patient have difficulty walking or climbing stairs?: Yes Weakness of Legs: Both Weakness of Arms/Hands: None  Permission Sought/Granted Permission sought to share information with : Family Supports Permission granted to share information with : Yes, Verbal Permission Granted  Share Information with NAME: son Ovid Curd  Permission granted to share  info w AGENCY: [facility]        Emotional Assessment Appearance:: Appears stated age Attitude/Demeanor/Rapport: Engaged Affect (typically observed): Appropriate, Pleasant Orientation: : Oriented to Self, Oriented to Place, Oriented to  Time, Oriented to  Situation      Admission diagnosis:  Osteomyelitis ([place]) [M86.9] Osteomyelitis of right foot ([place]) [M86.9] Patient Active Problem List   Diagnosis Date Noted   Osteomyelitis ([place]) 02/06/2022   Osteomyelitis of right foot ([place]) 02/06/2022   Type 1 diabetes mellitus with hyperglycemia ([place]) 10/28/2021   PVD (peripheral vascular disease) ([place]) 10/27/2021   Osteomyelitis of great toe of right foot (H[facility])    NSVT (nonsustained ventricular tachycardia) ([place]) 01/17/2021   Acute deep vein thrombosis (DVT) of right peroneal vein (HCC) 10/17/2020   Insulin long-term use ([place]) 07/18/2020   Bruit of left carotid artery 10/31/2019   Coronary artery disease involving native coronary artery of native heart without angina pectoris 10/31/2019   Open-angle glaucoma of right eye 10/31/2019   Dry eyes 03/01/2018   Presence of intraocular lens 10/18/2015   Cystoid macular edema 06/14/2012   PCP:  [name], MD Pharmacy:   [id number] - [place], [place] [address] [place] [address] Phone: [phone] Fax: [phone]  [place], [place] - [address] [place] [address] Phone: [phone] Fax: [phone]     Social Determinants of Health (SDOH) Interventions    Readmission Risk Interventions     View : No data to display.

## 2022-02-07 NOTE — Discharge Summary (Signed)
Physician Discharge Summary  [name] MRN: [id number] DOB/AGE: [birth year] 71 y.o.  Admit date: 02/06/2022 Discharge date: 02/07/2022  Admission Diagnoses: R foot osteomyelitis; hx of DVT, carotid bruit, cystoid macular edema, insulin dependent DM, NSVT, open-angle glaucoma, PVD  Discharge Diagnoses:  Principal Problem:   Osteomyelitis (HCC) Active Problems:   Osteomyelitis of right foot (HCC) Same as above  Discharged Condition: stable  Hospital Course: Patient presented to [facility] OR on 02/06/22 for elective R BKA by Dr. Toni Arthurs.  The patient tolerated the procedure well without complication.  She worked well with therapy.  DM coordinator consulted.  Patient obtained stump protector.  Tolerated her stay well without complication.  To be D/C'd back on to [facility] on 02/07/22.  Consults:  DM coordinator  Significant Diagnostic Studies: blood glucose  Treatments: IV hydration, antibiotics: Ancef, analgesia: acetaminophen, Dilaudid, and oxycodone, cardiac meds: lisinopril (Zestril) and labetolol, anticoagulation: Plavix, and surgery: As stated above.  Discharge Exam: Blood pressure (!) 149/68, pulse 75, temperature 98.3 F (36.8 C), temperature source Oral, resp. rate 18, height  (1.702 m), weight 57.6 kg, SpO2 97 %. General: WDWN patient in NAD. Psych:  Appropriate mood and affect. Neuro:  A&O x 3, Moving all extremities, sensation intact to light touch HEENT:  EOMs intact Chest:  Even non-labored respirations Skin:  Stump protector C/D/I, no rashes or lesions Extremities: warm/dry, no visible edema, erythema or echymosis.  No lymphadenopathy. Pulses: Femoral 2+ MSK:  ROM: TKE, MMT: able to perform quad set  Disposition: Discharge disposition: 03-Skilled Nursing Facility       Discharge Instructions     Call MD / Call 911   Complete by: As directed    If you experience chest pain or shortness of breath, CALL 911 and be transported to the  hospital emergency room.  If you develope a fever above 101 F, pus (white drainage) or increased drainage or redness at the wound, or calf pain, call your surgeon's office.   Constipation Prevention   Complete by: As directed    Drink plenty of fluids.  Prune juice may be helpful.  You may use a stool softener, such as Colace (over the counter) 100 mg twice a day.  Use MiraLax (over the counter) for constipation as needed.   Diet - low sodium heart healthy   Complete by: As directed    Increase activity slowly as tolerated   Complete by: As directed    Non weight bearing   Complete by: As directed    Laterality: right   Extremity: Lower   Post-operative opioid taper instructions:   Complete by: As directed    POST-OPERATIVE OPIOID TAPER INSTRUCTIONS: It is important to wean off of your opioid medication as soon as possible. If you do not need pain medication after your surgery it is ok to stop day one. Opioids include: Codeine, Hydrocodone(Norco, Vicodin), Oxycodone(Percocet, oxycontin) and hydromorphone amongst others.  Long term and even short term use of opiods can cause: Increased pain response Dependence Constipation Depression Respiratory depression And more.  Withdrawal symptoms can include Flu like symptoms Nausea, vomiting And more Techniques to manage these symptoms Hydrate well Eat regular healthy meals Stay active Use relaxation techniques(deep breathing, meditating, yoga) Do Not substitute Alcohol to help with tapering If you have been on opioids for less than two weeks and do not have pain than it is ok to stop all together.  Plan to wean off of opioids This plan should start within one  week post op of your joint replacement. Maintain the same interval or time between taking each dose and first decrease the dose.  Cut the total daily intake of opioids by one tablet each day Next start to increase the time between doses. The last dose that should be eliminated is  the evening dose.         Allergies as of 02/07/2022       Reactions   Penicillins Anaphylaxis        Medication List     TAKE these medications    acetaminophen 500 MG tablet Commonly known as: TYLENOL Take 1,000 mg by mouth 2 (two) times daily as needed for moderate pain.   atorvastatin 40 MG tablet Commonly known as: LIPITOR Take 40 mg by mouth at bedtime.   brimonidine 0.2 % ophthalmic solution Commonly known as: ALPHAGAN Place 1 drop into the left eye 3 (three) times daily.   calcium carbonate 1500 (600 Ca) MG Tabs tablet Commonly known as: OSCAL Take 600 mg of elemental calcium by mouth daily.   clopidogrel 75 MG tablet Commonly known as: PLAVIX Take 75 mg by mouth in the morning.   diclofenac Sodium 1 % Gel Commonly known as: VOLTAREN Apply 1 application topically 3 (three) times daily as needed (applied to right leg when needed for pain).   docusate sodium 100 MG capsule Commonly known as: Colace Take 1 capsule (100 mg total) by mouth 2 (two) times daily. While taking narcotic pain medicine.   Fish Oil 1000 MG Caps Take 1,000 mg by mouth in the morning.   FreeStyle Libre 14 Day Sensor Misc Inject 1 sensor to the skin every 14 days for continuous glucose monitoring.   furosemide 20 MG tablet Commonly known as: LASIX Take 20 mg by mouth in the morning.   glucosamine-chondroitin 500-400 MG tablet Take 1 tablet by mouth every morning.   glucose blood test strip 1 each by Other route as needed for other. Accu-chek   HAIR/SKIN/NAILS PO Take 1 tablet by mouth in the morning.   I-VITE PO Take 1 tablet by mouth in the morning.   insulin lispro 100 UNIT/ML KwikPen Commonly known as: HUMALOG Inject 5-15 Units into the skin See admin instructions. If blood sugar is 151-200 = 6 units, 201-250 = 10 units, 251-300  = 12 units, 301-400 = 15 units with each meal. If blood sugar is 201-400 inject 5 units at bedtime   Lantus SoloStar 100 UNIT/ML Solostar  Pen Generic drug: insulin glargine Inject 20 Units into the skin at bedtime. PATIENT SELF ADMINISTERS   latanoprost 0.005 % ophthalmic solution Commonly known as: XALATAN Place 1 drop into both eyes every evening.   lisinopril 2.5 MG tablet Commonly known as: ZESTRIL Take 2.5 mg by mouth in the morning.   miconazole 200 MG vaginal suppository Commonly known as: MICOTIN Place 200 mg vaginally at bedtime as needed (vaginal itching).   nitroGLYCERIN 0.4 MG SL tablet Commonly known as: NITROSTAT Place 0.4 mg under the tongue every 5 (five) minutes x 3 doses as needed for chest pain.   omeprazole 20 MG capsule Commonly known as: PRILOSEC Take 20 mg by mouth daily.   oxyCODONE 5 MG immediate release tablet Commonly known as: Roxicodone Take 1 tablet (5 mg total) by mouth every 4 (four) hours as needed for up to 5 days for severe pain.   Pen Needles 3/16" 31G X 5 MM Misc 1 Device by Does not apply route 5 (five) times daily. With  insulin administration   sennosides-docusate sodium 8.6-50 MG tablet Commonly known as: SENOKOT-S Take 1 tablet by mouth every other day. IN THE MORNING   timolol 0.5 % ophthalmic solution Commonly known as: TIMOPTIC Place 1 drop into the left eye 2 (two) times daily.               Discharge Care Instructions  (From admission, onward)           Start     Ordered   02/07/22 0000  Non weight bearing       Question Answer Comment  Laterality right   Extremity Lower      02/07/22 0714            Follow-up Information     Toni ArthursHewitt, John, MD. Schedule an appointment as soon as possible for a visit in 2 week(s).   Specialty: Orthopedic Surgery Contact information: [address] [place] [id number] [phone]                 Signed: Lolly MustacheJustin Adeyemi Hamad PA-C [facility] Office:  [phone]

## 2022-02-07 NOTE — [place] FL2 (Signed)
[place] MEDICAID FL2 LEVEL OF CARE SCREENING TOOL     IDENTIFICATION  Patient Name: Caitlin Sharp Birthdate: 1951-02-18 Sex: female Admission Date (Current Location): 02/06/2022  [facility] and [place] Number:  Producer, television/film/video and Address:  [facility], [address] [place], [place] [address]      Provider Number: [id number]  Attending Physician Name and Address:  Toni Arthurs, MD  Relative Name and Phone Number:  Auree, Heggie [phone]    Current Level of Care: Hospital Recommended Level of Care: Skilled Nursing Facility Prior Approval Number:    Date Approved/Denied:   PASRR Number:    Discharge Plan: SNF    Current Diagnoses: Patient Active Problem List   Diagnosis Date Noted   Osteomyelitis (HCC) 02/06/2022   Osteomyelitis of right foot (HCC) 02/06/2022   Type 1 diabetes mellitus with hyperglycemia (HCC) 10/28/2021   PVD (peripheral vascular disease) (HCC) 10/27/2021   Osteomyelitis of great toe of right foot (HCC)    NSVT (nonsustained ventricular tachycardia) (HCC) 01/17/2021   Acute deep vein thrombosis (DVT) of right peroneal vein (HCC) 10/17/2020   Insulin long-term use (HCC) 07/18/2020   Bruit of left carotid artery 10/31/2019   Coronary artery disease involving native coronary artery of native heart without angina pectoris 10/31/2019   Open-angle glaucoma of right eye 10/31/2019   Dry eyes 03/01/2018   Presence of intraocular lens 10/18/2015   Cystoid macular edema 06/14/2012    Orientation RESPIRATION BLADDER Height & Weight     Self, Time, Situation, Place  Normal Continent Weight: 127 lb (57.6 kg) Height:  5\' 7"  (170.2 cm)  BEHAVIORAL SYMPTOMS/MOOD NEUROLOGICAL BOWEL NUTRITION STATUS      Continent Diet (see discharge summary)  AMBULATORY STATUS COMMUNICATION OF NEEDS Skin   Total Care Verbally Surgical wounds                       Personal Care Assistance Level of Assistance  Bathing,  Feeding, Dressing Bathing Assistance: Limited assistance Feeding assistance: Independent Dressing Assistance: Limited assistance     Functional Limitations Info  Sight, Hearing, Speech Sight Info: Adequate Hearing Info: Adequate Speech Info: Adequate    SPECIAL CARE FACTORS FREQUENCY  PT (By licensed PT), OT (By licensed OT)     PT Frequency: eval and treat OT Frequency: eval and treat            Contractures Contractures Info: Not present    Additional Factors Info  Code Status, Allergies, Insulin Sliding Scale Code Status Info: full Allergies Info: penicillins   Insulin Sliding Scale Info: see discharge summary       Current Medications (02/07/2022):  This is the current hospital active medication list Current Facility-Administered Medications  Medication Dose Route Frequency Provider Last Rate Last Admin   0.9 %  sodium chloride infusion   Intravenous Continuous Jacinta Shoe, PA-C 75 mL/hr at 02/06/22 1230 New Bag at 02/06/22 1230   acetaminophen (TYLENOL) tablet 325-650 mg  325-650 mg Oral Q6H PRN Jacinta Shoe, PA-C       alum & mag hydroxide-simeth (MAALOX/MYLANTA) 200-200-20 MG/5ML suspension 15-30 mL  15-30 mL Oral Q2H PRN Jacinta Shoe, PA-C       atorvastatin (LIPITOR) tablet 40 mg  40 mg Oral QHS Alfredo Martinez IXL, [place]   40 mg at 02/06/22 2125   brimonidine (ALPHAGAN) 0.2 % ophthalmic solution 1 drop  1 drop Left Eye TID Jacinta Shoe, PA-C  1 drop at 02/06/22 2134   clopidogrel (PLAVIX) tablet 75 mg  75 mg Oral q AM Corky Sing, PA-C   75 mg at 02/07/22 0732   diphenhydrAMINE (BENADRYL) 12.5 MG/5ML elixir 12.5-25 mg  12.5-25 mg Oral Q4H PRN Corky Sing, PA-C       docusate sodium (COLACE) capsule 100 mg  100 mg Oral BID Corky Sing, PA-C   100 mg at 02/06/22 2125   furosemide (LASIX) tablet 20 mg  20 mg Oral q AM Corky Sing, PA-C   20 mg at 02/07/22 0732   guaiFENesin-dextromethorphan (ROBITUSSIN DM) 100-10  MG/5ML syrup 15 mL  15 mL Oral Q4H PRN Corky Sing, PA-C       hydrALAZINE (APRESOLINE) injection 5 mg  5 mg Intravenous Q20 Min PRN Corky Sing, PA-C       HYDROmorphone (DILAUDID) injection 0.5-1 mg  0.5-1 mg Intravenous Q4H PRN Corky Sing, PA-C       HYDROmorphone (DILAUDID) tablet 2-3 mg  2-3 mg Oral Q4H PRN Corky Sing, PA-C       insulin aspart (novoLOG) injection 0-9 Units  0-9 Units Subcutaneous TID WC Corky Sing, PA-C   3 Units at 02/06/22 1729   insulin aspart (novoLOG) injection 4 Units  4 Units Subcutaneous TID WC Corky Sing, PA-C   4 Units at 02/06/22 1729   insulin detemir (LEVEMIR) injection 10 Units  10 Units Subcutaneous BID Corky Sing, [place]   10 Units at 02/06/22 2125   labetalol (NORMODYNE) injection 10 mg  10 mg Intravenous Q10 min PRN Corky Sing, PA-C   10 mg at 02/07/22 0545   latanoprost (XALATAN) 0.005 % ophthalmic solution 1 drop  1 drop Both Eyes QPM Corky Sing, PA-C   1 drop at 02/06/22 1730   lisinopril (ZESTRIL) tablet 2.5 mg  2.5 mg Oral q AM Corky Sing, PA-C   2.5 mg at 02/07/22 [id number]   magnesium sulfate IVPB 2 g 50 mL  2 g Intravenous Daily PRN Corky Sing, PA-C       metoprolol tartrate (LOPRESSOR) injection 2-5 mg  2-5 mg Intravenous Q2H PRN Corky Sing, PA-C       nitroGLYCERIN (NITROSTAT) SL tablet 0.4 mg  0.4 mg Sublingual Q5 Min x 3 PRN Corky Sing, PA-C       ondansetron [facility]) tablet 4 mg  4 mg Oral Q6H PRN Corky Sing, PA-C       Or   ondansetron [facility]) injection 4 mg  4 mg Intravenous Q6H PRN Corky Sing, PA-C       oxyCODONE (Oxy IR/ROXICODONE) immediate release tablet 5-10 mg  5-10 mg Oral Q4H PRN Corky Sing, PA-C   5 mg at 02/07/22 0104   pantoprazole (PROTONIX) EC tablet 40 mg  40 mg Oral Daily Corky Sing, PA-C       pantoprazole (PROTONIX) EC tablet 40 mg  40 mg Oral Daily Corky Sing, PA-C   40 mg at 02/06/22 1345    phenol (CHLORASEPTIC) mouth spray 1 spray  1 spray Mouth/Throat PRN Corky Sing, PA-C       potassium chloride SA (KLOR-CON M) CR tablet 20-40 mEq  20-40 mEq Oral Daily PRN Corky Sing, PA-C       senna (SENOKOT) tablet 8.6 mg  1 tablet Oral BID Corky Sing, PA-C   8.6 mg at 02/06/22 2125   senna-docusate (Senokot-S)  tablet 1 tablet  1 tablet Oral QHS PRN Corky Sing, PA-C       timolol (TIMOPTIC) 0.5 % ophthalmic solution 1 drop  1 drop Left Eye BID Corky Sing, [place]   1 drop at 02/06/22 2123     Discharge Medications: Please see discharge summary for a list of discharge medications.  Relevant Imaging Results:  Relevant Lab Results:   Additional Information SSN: [id number]  Joanne Chars, LCSW

## 2022-02-10 ENCOUNTER — Non-Acute Institutional Stay (SKILLED_NURSING_FACILITY): Payer: Medicare Other | Admitting: Internal Medicine

## 2022-02-10 ENCOUNTER — Encounter: Payer: Self-pay | Admitting: Internal Medicine

## 2022-02-10 ENCOUNTER — Telehealth: Payer: Self-pay

## 2022-02-10 DIAGNOSIS — E785 Hyperlipidemia, unspecified: Secondary | ICD-10-CM | POA: Diagnosis not present

## 2022-02-10 DIAGNOSIS — I214 Non-ST elevation (NSTEMI) myocardial infarction: Secondary | ICD-10-CM | POA: Diagnosis not present

## 2022-02-10 DIAGNOSIS — Z89511 Acquired absence of right leg below knee: Secondary | ICD-10-CM | POA: Diagnosis not present

## 2022-02-10 DIAGNOSIS — E1065 Type 1 diabetes mellitus with hyperglycemia: Secondary | ICD-10-CM | POA: Diagnosis not present

## 2022-02-10 DIAGNOSIS — F419 Anxiety disorder, unspecified: Secondary | ICD-10-CM

## 2022-02-10 DIAGNOSIS — I10 Essential (primary) hypertension: Secondary | ICD-10-CM

## 2022-02-10 DIAGNOSIS — D649 Anemia, unspecified: Secondary | ICD-10-CM

## 2022-02-10 NOTE — Telephone Encounter (Signed)
Transition Care Management Unsuccessful Follow-up Telephone Call  Date of discharge and from where:  02/07/2022; [facility]   Attempts:  1st Attempt  Reason for unsuccessful TCM follow-up call:  Unable to be reached ;due to release into a skilled nursing facility.

## 2022-02-10 NOTE — Progress Notes (Signed)
Provider:  Einar Crow, MD Location:   [facility] Room Number: [phone] Place of Service:  SNF ([id number])  PCP: [name], MD Patient Care Team: [name], MD as PCP - General (Internal Medicine) [name], MD as Referring Physician (Ophthalmology) Beverely Pace, [name], MD as Referring Physician (Cardiology) Bertram Gala, MD as Referring Physician (Cardiology)  Extended Emergency Contact Information Primary Emergency Contact: [name] Address: [address]          [place], [place] [address] [name] of [place] Home Phone: [phone] Mobile Phone: [phone] Relation: Son Preferred language: English Interpreter needed? No Secondary Emergency Contact: [name],[name] Mobile Phone: [phone] Relation: Relative  Code Status: FULL CODE Goals of Care: Advanced Directive information    02/10/2022   10:32 AM  Advanced Directives  Does Patient Have a Medical Advance Directive? Yes  Type of Advance Directive Healthcare Power of Attorney  Does patient want to make changes to medical advance directive? No - Patient declined  Copy of Healthcare Power of Attorney in Chart? Yes - validated most recent copy scanned in chart (See row information)      Chief Complaint  Patient presents with  . New Admit To SNF    New admission    HPI: Patient is a 71 y.o. female seen today for admission to SNF for rehab after undergoing  Right BKA for Chronic Osteomyelitis  H/o Type 1 Diabetes For past 50 years. Has been on Pump but now Long Acting Insulin and Novolog now CAD S/o CABG in 2012 osteoporosis 2021 -2.7 Glaucoma with Poor Vision Mild Cognitive impairment Patient was diagnosed with Osteomyelitis on 7/27 Underwent Right Great toe  amputation on 7/27 Elective Amputation of Right Foot 1St ray by Dr Lajoyce Corners on 10/28 S/p Vascular study by Dr Karin Lieu  MRI by Dr Zenaida Niece dam shows Persistent Osteomyelitis in Middle and Lateral Cuneiform bones  and Metatarsals  BKA by Dr Victorino Dike  Now in Rehab Main issue is CBGs She refused her Lantus last night Got 10 units instead of 20 Then had High CBG of more then 500 this morning Son in the room want [place] to know she is having some cogntive issues with her pain meds and wants to make sure the y insist on insukin No Pain Has not started working with therapy yet   Past Medical History:  Diagnosis Date  . Abnormal thyroid blood test    Per Records recevied from [facility], Dr.Pearson  . Acute deep vein thrombosis of right peroneal vein (HCC) 10/05/2020   RLE DVT  . Anxiety   . Arthritis    RA  . Coronary artery disease   . Diabetes type I [facility])    Per [facility] New Patient Packet  . Diabetic retinopathy [facility])    Per [facility] New Patient Packet  . DKA (diabetic ketoacidosis) (HCC) 2021   Dr.William Zackowski.  Insulin Pump was out of Insulin  . Glaucoma    Per [facility] New Patient Packet  . H/O heart bypass surgery 2012   Dr. Excell Seltzer, Per [facility] New Patient Packet  . Heart murmur    when I was a teenager  . History of blood transfusion   . History of kidney stones    passed  . Hx of CABG    Per records [facility], Dr.Pearson   . Left bundle branch block   . Mild mitral regurgitation    Per [facility], Dr.Pearson   . Neuropathy 1976  Per [facility] New Patient Packet  . NSTEMI (non-ST elevated myocardial infarction) (HCC) 09/21/2020   NSTEMI/demand ischemic in setting of DKA, patent grafts by 09/24/20 [facility], continue medical therapy  . Osteoporosis    T Score -2.7 , Per [facility] New Patient Packet  . 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 PVD (peripheral vascular disease) (HCC) 10/27/2021  . Type 1 diabetes mellitus with hyperglycemia (HCC) 10/28/2021   Past Surgical History:  Procedure Laterality Date  . ABDOMINAL AORTOGRAM W/LOWER EXTREMITY N/A  10/02/2021   Procedure: ABDOMINAL AORTOGRAM W/LOWER EXTREMITY;  Surgeon: Victorino Sparrow, MD;  Location: [facility] INVASIVE CV LAB;  Service: Cardiovascular;  Laterality: N/A;  . AMPUTATION Right 04/17/2021   Procedure: RIGHT GREAT TOE AMPUTATION;  Surgeon: [name], MD;  Location: [facility] OR;  Service: Orthopedics;  Laterality: Right;  . AMPUTATION Right 07/19/2021   Procedure: RIGHT FOOT 1ST RAY AMPUTATION;  Surgeon: [name], MD;  Location[facility] OR;  Service: Orthopedics;  Laterality: Right;  . AMPUTATION Right 02/06/2022   Procedure: AMPUTATION BELOW KNEE;  Surgeon: Toni Arthurs, MD;  Location: [facility] OR;  Service: Orthopedics;  Laterality: Right;  regional block  . CESAREAN SECTION  1970   Dr. Aquilla Hacker, Per [facility] New Patient Packet  . CESAREAN SECTION  1973   Dr. Aquilla Hacker, Per [facility] New Patient Packet  . CORONARY ARTERY BYPASS GRAFT      reports that she has never smoked. She has never used smokeless tobacco. She reports that she does not drink alcohol and does not use drugs. Social History   Socioeconomic History  . Marital status: Widowed    Spouse name: Not on file  . Number of children: Not on file  . Years of education: Not on file  . Highest education level: Not on file  Occupational History  . Not on file  Tobacco Use  . Smoking status: Never  . Smokeless tobacco: Never  Vaping Use  . Vaping Use: Never used  Substance and Sexual Activity  . Alcohol use: Never  . Drug use: Never  . Sexual activity: Not on file  Other Topics Concern  . Not on file  Social History Narrative   Per [facility] New Patient Packet Abstracted on 02/14/2021      Diet: Left blank       Caffeine: Yes      Married, if yes what year: Widowed, married in 1969      Do you live in a house, apartment, assisted living, condo, trailer, ect: Assisted Living       Is it one or more stories: 2      How many persons live in your home?One      Pets:No      Highest level or education completed:        Current/Past profession: Accountant       Exercise:        Yes          Type and how often: Walking 3-4 time weekly          Living Will: Yes   DNR: No   POA/HPOA: Yes      Social Determinants of Corporate investment banker Strain: Not on file  Food Insecurity: Not on file  Transportation Needs: Not on file  Physical Activity: Not on file  Stress: Not on file  Social Connections:  Not on file  Intimate Partner Violence: Not on file    Functional Status Survey:    Family History  Problem Relation Age of Onset  . Congestive Heart Failure Mother        Per [facility] New Patient Packet  . Arthritis Mother        Per [facility] New Patient Packet  . Osteoporosis Mother        Per [facility] New Patient Packet  . Heart attack Father        Per [facility] New Patient Packet  . Diabetes Sister        Per [facility] New Patient Packet  . Emphysema Brother        Per [facility] New Patient Packet  . Heart disease Brother        Per [facility] New Patient Packet    Health Maintenance  Topic Date Due  . FOOT EXAM  Never done  . OPHTHALMOLOGY EXAM  Never done  . Hepatitis C Screening  Never done  . COLONOSCOPY (Pts 45-68yrs Insurance coverage will need to be confirmed)  Never done  . MAMMOGRAM  Never done  . Zoster Vaccines- Shingrix (1 of 2) Never done  . Pneumonia Vaccine 5+ Years old (1 - PCV) Never done  . DEXA SCAN  Never done  . COVID-19 Vaccine (3 - Booster for Pfizer series) 02/20/2020  . INFLUENZA VACCINE  04/22/2022  . HEMOGLOBIN A1C  07/27/2022  . TETANUS/TDAP  07/11/2024  . HPV VACCINES  Aged Out    Allergies  Allergen Reactions  . Penicillins Anaphylaxis    Allergies as of 02/10/2022       Reactions   Penicillins Anaphylaxis        Medication List        Accurate as of Feb 10, 2022 10:35 AM. If you have any questions, ask your nurse or doctor.          STOP taking these medications    diclofenac Sodium 1 % Gel Commonly known as: VOLTAREN Stopped by: [name], MD   glucose  blood test strip Stopped by: [name], MD   oxyCODONE 5 MG immediate release tablet Commonly known as: Roxicodone Stopped by: [name], MD       TAKE these medications    acetaminophen 500 MG tablet Commonly known as: TYLENOL Take 1,000 mg by mouth 2 (two) times daily as needed for moderate pain.   atorvastatin 40 MG tablet Commonly known as: LIPITOR Take 40 mg by mouth at bedtime.   brimonidine 0.2 % ophthalmic solution Commonly known as: ALPHAGAN Place 1 drop into the left eye 3 (three) times daily.   calcium carbonate 1500 (600 Ca) MG Tabs tablet Commonly known as: OSCAL Take 600 mg of elemental calcium by mouth daily.   clopidogrel 75 MG tablet Commonly known as: PLAVIX Take 75 mg by mouth in the morning.   docusate sodium 100 MG capsule Commonly known as: Colace Take 1 capsule (100 mg total) by mouth 2 (two) times daily. While taking narcotic pain medicine.   Fish Oil 1000 MG Caps Take 1,000 mg by mouth in the morning.   FreeStyle Libre 14 Day Sensor Misc Inject 1 sensor to the skin every 14 days for continuous glucose monitoring.   furosemide 20 MG tablet Commonly known as: LASIX Take 20 mg by mouth in the morning.   glucosamine-chondroitin 500-400 MG tablet Take 1 tablet by mouth every morning.   HAIR/SKIN/NAILS PO Take 1 tablet  by mouth in the morning.   I-VITE PO Take 1 tablet by mouth in the morning.   insulin lispro 100 UNIT/ML KwikPen Commonly known as: HUMALOG Inject 5-15 Units into the skin See admin instructions. If blood sugar is 151-200 = 6 units, 201-250 = 10 units, 251-300  = 12 units, 301-400 = 15 units with each meal. If blood sugar is 201-400 inject 5 units at bedtime   Lantus SoloStar 100 UNIT/ML Solostar Pen Generic drug: insulin glargine Inject 20 Units into the skin at bedtime. PATIENT SELF ADMINISTERS   latanoprost 0.005 % ophthalmic solution Commonly known as: XALATAN Place 1 drop into both eyes every evening.    lisinopril 2.5 MG tablet Commonly known as: ZESTRIL Take 2.5 mg by mouth in the morning.   miconazole 200 MG vaginal suppository Commonly known as: MICOTIN Place 200 mg vaginally at bedtime as needed (vaginal itching).   nitroGLYCERIN 0.4 MG SL tablet Commonly known as: NITROSTAT Place 0.4 mg under the tongue every 5 (five) minutes x 3 doses as needed for chest pain.   omeprazole 20 MG capsule Commonly known as: PRILOSEC Take 20 mg by mouth daily.   Pen Needles 3/16" 31G X 5 MM Misc 1 Device by Does not apply route 5 (five) times daily. With insulin administration   sennosides-docusate sodium 8.6-50 MG tablet Commonly known as: SENOKOT-S Take 1 tablet by mouth every other day. IN THE MORNING   timolol 0.5 % ophthalmic solution Commonly known as: TIMOPTIC Place 1 drop into the left eye 2 (two) times daily.        Review of Systems  Constitutional:  Negative for activity change and appetite change.  HENT: Negative.    Respiratory:  Negative for cough and shortness of breath.   Cardiovascular:  Negative for leg swelling.  Gastrointestinal:  Negative for constipation.  Genitourinary: Negative.   Musculoskeletal:  Negative for arthralgias, gait problem and myalgias.  Skin: Negative.   Neurological:  Negative for dizziness and weakness.  Psychiatric/Behavioral:  Negative for confusion, dysphoric mood and sleep disturbance.    Vitals:   02/10/22 1017  BP: (!) 156/68  Pulse: 84  Resp: 18  Temp: 98.3 F (36.8 C)  SpO2: 95%  Weight: 126 lb 14.4 oz (57.6 kg)  Height:  (1.702 m)   Body mass index is 19.88 kg/m. Physical Exam Vitals reviewed.  Constitutional:      Appearance: Normal appearance.  HENT:     Head: Normocephalic.     Nose: Nose normal.     Mouth/Throat:     Mouth: Mucous membranes are moist.     Pharynx: Oropharynx is clear.  Eyes:     Pupils: Pupils are equal, round, and reactive to light.  Cardiovascular:     Rate and Rhythm: Normal rate  and regular rhythm.     Pulses: Normal pulses.     Heart sounds: Normal heart sounds. No murmur heard. Pulmonary:     Effort: Pulmonary effort is normal.     Breath sounds: Normal breath sounds.  Abdominal:     General: Abdomen is flat. Bowel sounds are normal.     Palpations: Abdomen is soft.  Musculoskeletal:        General: No swelling.     Cervical back: Neck supple.     Comments: Right BKA  Skin:    General: Skin is warm.  Neurological:     General: No focal deficit present.     Mental Status: She is alert and oriented  to person, place, and time.  Psychiatric:        Mood and Affect: Mood normal.        Thought Content: Thought content normal.    Labs reviewed: Basic Metabolic Panel: Recent Labs    10/28/21 1039 12/18/21 0000 02/06/22 0612 02/07/22 0222  NA 138 137 137 137  K 4.2 5.0 4.3 4.4  CL 100 99 101 104  CO2 32 27* 27 27  GLUCOSE 249*  --  277* 248*  BUN 24 25* 24* 22  CREATININE 0.99 1.0 0.79 0.86  CALCIUM 9.8 8.8 9.4 8.5*   Liver Function Tests: Recent Labs    04/13/21 1825 08/19/21 0000 10/28/21 1039 12/18/21 0000  AST 19 16 19 14   ALT 9 11 17 9   ALKPHOS 79 144*  --  139*  BILITOT 0.4  --  0.4  --   PROT 7.3  --  6.9  --   ALBUMIN 3.8 3.6  --  3.2*   No results for input(s): LIPASE, AMYLASE in the last 8760 hours. No results for input(s): AMMONIA in the last 8760 hours. CBC: Recent Labs    04/13/21 1825 07/19/21 1102 09/06/21 0937 10/02/21 0725 10/28/21 1039 12/18/21 0000 02/06/22 0612 02/07/22 0222  WBC 7.5   < > 7.1  --  6.5 5.9 7.7 7.6  NEUTROABS 4.4  --  4,395  --  3,562  --   --   --   HGB 10.7*   < > 10.1*   < > 10.4* 9.1* 10.2* 8.5*  HCT 32.0*   < > 31.2*   < > 32.3* 27* 31.7* 26.6*  MCV 90.9   < > 91.0  --  88.7  --  88.5 87.5  PLT 331   < > 357  --  286 363 367 325   < > = values in this interval not displayed.   Cardiac Enzymes: No results for input(s): CKTOTAL, CKMB, CKMBINDEX, TROPONINI in the last 8760  hours. BNP: Invalid input(s): POCBNP Lab Results  Component Value Date   HGBA1C 12.7 01/24/2022   Lab Results  Component Value Date   TSH 2.90 06/10/2021   No results found for: VITAMINB12 No results found for: FOLATE No results found for: IRON, TIBC, FERRITIN  Imaging and Procedures obtained prior to SNF admission: No results found.  Assessment/Plan  1. S/P BKA (below knee amputation) unilateral, right (HCC) Pain Controlled Avoid Narcotic Therapy is going to work  2. Type 1 diabetes mellitus with hyperglycemia (HCC) Will change Lantus to the morning Wrote new sliding sale  3. H/O NSTEMI (non-ST elevated myocardial infarction) (HCC) On   4. Hyperlipidemia, unspecified hyperlipidemia type ***  5. Anxiety ***  6. Anemia, unspecified type Repeat CBC    Family/ staff Communication:   Labs/tests ordered:

## 2022-02-11 LAB — BASIC METABOLIC PANEL
BUN: 27 — AB (ref 4–21)
CO2: 21 (ref 13–22)
Chloride: 96 — AB (ref 99–108)
Creatinine: 0.7 (ref 0.5–1.1)
Glucose: 432
Potassium: 5 mEq/L (ref 3.5–5.1)
Sodium: 134 — AB (ref 137–147)

## 2022-02-11 LAB — CBC AND DIFFERENTIAL
HCT: 34 — AB (ref 36–46)
Hemoglobin: 11.2 — AB (ref 12.0–16.0)
Platelets: 418 10*3/uL — AB (ref 150–400)
WBC: 7.7

## 2022-02-11 LAB — COMPREHENSIVE METABOLIC PANEL: Calcium: 9.1 (ref 8.7–10.7)

## 2022-02-11 LAB — CBC: RBC: 3.9 (ref 3.87–5.11)

## 2022-02-20 ENCOUNTER — Non-Acute Institutional Stay (SKILLED_NURSING_FACILITY): Payer: Medicare Other | Admitting: Adult Health

## 2022-02-20 ENCOUNTER — Encounter: Payer: Self-pay | Admitting: Adult Health

## 2022-02-20 DIAGNOSIS — D638 Anemia in other chronic diseases classified elsewhere: Secondary | ICD-10-CM | POA: Diagnosis not present

## 2022-02-20 DIAGNOSIS — I10 Essential (primary) hypertension: Secondary | ICD-10-CM

## 2022-02-20 DIAGNOSIS — M86671 Other chronic osteomyelitis, right ankle and foot: Secondary | ICD-10-CM

## 2022-02-20 DIAGNOSIS — E1065 Type 1 diabetes mellitus with hyperglycemia: Secondary | ICD-10-CM | POA: Diagnosis not present

## 2022-02-20 DIAGNOSIS — Z89511 Acquired absence of right leg below knee: Secondary | ICD-10-CM

## 2022-02-20 LAB — CBC AND DIFFERENTIAL
HCT: 34 — AB (ref 36–46)
Hemoglobin: 11.2 — AB (ref 12.0–16.0)
WBC: 6.3

## 2022-02-20 LAB — BASIC METABOLIC PANEL
BUN: 23 — AB (ref 4–21)
CO2: 26 — AB (ref 13–22)
Chloride: 103 (ref 99–108)
Creatinine: 0.7 (ref 0.5–1.1)
Glucose: 127
Potassium: 4.3 mEq/L (ref 3.5–5.1)
Sodium: 143 (ref 137–147)

## 2022-02-20 LAB — COMPREHENSIVE METABOLIC PANEL: Calcium: 9.2 (ref 8.7–10.7)

## 2022-02-20 LAB — CBC: RBC: 3.9 (ref 3.87–5.11)

## 2022-02-20 NOTE — Progress Notes (Signed)
Location:  [facility] Room Number: 154-A Place of Service:  SNF [phone]) Provider:  Royal Hawthorn, NP   Patient Care Team: [name], MD as PCP - General (Internal Medicine) San Morelle, MD as Referring Physician (Ophthalmology) Atilano Median, Gayleen Orem, MD as Referring Physician (Cardiology) Alyssa Grove, MD as Referring Physician (Cardiology)  Extended Emergency Contact Information Primary Emergency Contact: [name] Address: [address]          [place], [place] [address] [name] of [place] Phone: [phone] Mobile Phone: [phone] Relation: Son Preferred language: English Interpreter needed? No Secondary Emergency Contact: [name],[name] Mobile Phone: [phone] Relation: Relative  Code Status:  Full Code  Goals of care: Advanced Directive information    02/20/2022    9:38 AM  Advanced Directives  Does Patient Have a Medical Advance Directive? Yes  Type of Advance Directive [place]  Does patient want to make changes to medical advance directive? No - Patient declined  Copy of [place] in Chart? Yes - validated most recent copy scanned in chart (See row information)     Chief Complaint  Patient presents with   Follow-up    Low blood pressure follow-up    HPI:  Pt is a 71 y.o. female seen today for an acute visit for low blood pressure follow up.  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  She is going to have her dressing removed and assessed by surgery today. She is diabetic Type 1 with some memory loss and visual loss. Lived in [place] prior to surgery.   BP 75/46 when working with therapy on 5/30. Lasix reduced to QOD which was on board for leg swelling prior to amputation.   BP recorded 5/31 107/55, 115/70 For my visit today her repeat BP 196/80, then recheck 106/63 02/19/22 [facility] Breakfast 202, Lunch 221, Dinner  117, HS 185   Blood sugars have been improving on lantus and meal coverage except for some highs a few days ago. A1C 12.7 01/24/22, 11.8 12/18/21   Hgb 8.5 02/11/22, 10.2 at baseline.   Past Medical History:  Diagnosis Date   Abnormal thyroid blood test    Per Records recevied from [facility], Dr.Pearson   Acute deep vein thrombosis of right peroneal vein ([place]) 10/05/2020   RLE DVT   Anxiety    Arthritis    RA   Coronary artery disease    Diabetes type I [facility])    Per [facility] New Patient Packet   Diabetic retinopathy ([place])    Per [facility] New Patient Packet   DKA (diabetic ketoacidosis) ([place]) 2021   Dr.William Zackowski.  Insulin Pump was out of Insulin   Glaucoma    Per [place] Patient Packet   H/O heart bypass surgery 2012   Dr. Luana Shu, Per [facility] New Patient Packet   Heart murmur    when I was a teenager   History of blood transfusion    History of kidney stones    passed   Hx of CABG    Per records received form [facility], Dr.Pearson    Left bundle branch block    Mild mitral regurgitation    Per records from [facility], [place]    Neuropathy 1976   Per [place] Patient Packet   NSTEMI (non-ST elevated myocardial infarction) ([place]) 09/21/2020   NSTEMI/demand ischemic in setting of DKA, patent grafts by 09/24/20 [facility], continue medical  therapy   Osteoporosis    T Score -2.7 , Per [place] New Patient Packet   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 due to underlying condition [facility])    Per Records recevied from [facility], Dr.Pearson   PVD (peripheral vascular disease) ([place]) 10/27/2021   Type 1 diabetes mellitus with hyperglycemia ([place]) 10/28/2021   Past Surgical History:  Procedure Laterality Date   ABDOMINAL AORTOGRAM W/LOWER EXTREMITY N/A 10/02/2021   Procedure: ABDOMINAL AORTOGRAM W/LOWER EXTREMITY;  Surgeon: Broadus John, MD;  Location: [place] CV LAB;  Service: Cardiovascular;  Laterality: N/A;   AMPUTATION Right 04/17/2021    Procedure: RIGHT GREAT TOE AMPUTATION;  Surgeon: Newt Minion, MD;  Location: [place];  Service: Orthopedics;  Laterality: Right;   AMPUTATION Right 07/19/2021   Procedure: RIGHT FOOT 1ST RAY AMPUTATION;  Surgeon: Newt Minion, MD;  Location: [place];  Service: Orthopedics;  Laterality: Right;   AMPUTATION Right 02/06/2022   Procedure: AMPUTATION BELOW KNEE;  Surgeon: Wylene Simmer, MD;  Location: [place];  Service: Orthopedics;  Laterality: Right;  regional block   CESAREAN SECTION  1970   Dr. Moses Manners, Per [facility] New Patient Packet   CESAREAN SECTION  1973   Dr. Moses Manners, Per [facility] New Patient Packet   CORONARY ARTERY BYPASS GRAFT      Allergies  Allergen Reactions   Penicillins Anaphylaxis    Outpatient Encounter Medications as of 02/20/2022  Medication Sig   acetaminophen (TYLENOL) 500 MG tablet Take 1,000 mg by mouth 2 (two) times daily as needed for moderate pain.   atorvastatin (LIPITOR) 40 MG tablet Take 40 mg by mouth at bedtime.   Biotin w/ Vitamins C & E (HAIR/SKIN/NAILS PO) Take 1 tablet by mouth in the morning.   brimonidine (ALPHAGAN) 0.2 % ophthalmic solution Place 1 drop into the left eye 3 (three) times daily.   calcium carbonate (OSCAL) 1500 (600 Ca) MG TABS tablet Take 600 mg of elemental calcium by mouth daily.   clopidogrel (PLAVIX) 75 MG tablet Take 75 mg by mouth in the morning.   Continuous Blood Gluc Sensor (FREESTYLE LIBRE 14 DAY SENSOR) MISC Inject 1 sensor to the skin every 14 days for continuous glucose monitoring.   docusate sodium (COLACE) 100 MG capsule Take 100 mg by mouth daily as needed for mild constipation.   furosemide (LASIX) 20 MG tablet Take 20 mg by mouth in the morning.   glucosamine-chondroitin 500-400 MG tablet Take 1 tablet by mouth every morning.   insulin glargine (LANTUS SOLOSTAR) 100 UNIT/ML Solostar Pen Inject 20 Units into the skin at bedtime. PATIENT SELF ADMINISTERS   insulin lispro (HUMALOG) 100 UNIT/ML KwikPen Inject 5-15 Units into the  skin See admin instructions. If blood sugar is 151-200 = 5 units, 201-250 = 8 units, 251-300  = 10 units, 301-350 = 12 units, 351-400 = 14 units, 401 and above =  16.  If blood sugar is greater than 200 inject 5 units at bedtime, if greater than 500 call on call provider   latanoprost (XALATAN) 0.005 % ophthalmic solution Place 1 drop into both eyes every evening.   lisinopril (ZESTRIL) 2.5 MG tablet Take 2.5 mg by mouth in the morning.   miconazole (MICOTIN) 200 MG vaginal suppository Place 200 mg vaginally at bedtime as needed (vaginal itching).   Multiple Vitamins-Minerals (I-VITE PO) Take 1 tablet by mouth in the morning.   nitroGLYCERIN (NITROSTAT) 0.4 MG SL tablet Place 0.4 mg under the tongue  every 5 (five) minutes x 3 doses as needed for chest pain.   Omega-3 Fatty Acids (FISH OIL) 1000 MG CAPS Take 1,000 mg by mouth in the morning.   omeprazole (PRILOSEC) 20 MG capsule Take 20 mg by mouth daily.   sennosides-docusate sodium (SENOKOT-S) 8.6-50 MG tablet Take 1 tablet by mouth every other day. IN THE MORNING   timolol (TIMOPTIC) 0.5 % ophthalmic solution Place 1 drop into the left eye 2 (two) times daily.   Insulin Pen Needle (PEN NEEDLES 3/16") 31G X 5 MM MISC 1 Device by Does not apply route 5 (five) times daily. With insulin administration   [DISCONTINUED] docusate sodium (COLACE) 100 MG capsule Take 1 capsule (100 mg total) by mouth 2 (two) times daily. While taking narcotic pain medicine.   No facility-administered encounter medications on file as of 02/20/2022.    Review of Systems  Constitutional:  Positive for activity change. Negative for appetite change, chills, diaphoresis, fatigue, fever and unexpected weight change.  HENT:  Negative for congestion.   Respiratory:  Negative for cough, shortness of breath and wheezing.   Cardiovascular:  Negative for chest pain, palpitations and leg swelling.  Gastrointestinal:  Negative for abdominal distention, abdominal pain, constipation and  diarrhea.  Genitourinary:  Negative for difficulty urinating and dysuria.  Musculoskeletal:  Positive for gait problem. Negative for arthralgias, back pain, joint swelling and myalgias.  Skin:  Positive for wound.  Neurological:  Negative for dizziness, tremors, seizures, syncope, facial asymmetry, speech difficulty, weakness, light-headedness, numbness and headaches.  Psychiatric/Behavioral:  Negative for agitation, behavioral problems and confusion.    Immunization History  Administered Date(s) Administered   Influenza, High Dose Seasonal PF 09/01/2005   PFIZER(Purple Top)SARS-COV-2 Vaccination 12/02/2019, 12/26/2019   Tdap 07/11/2014   Pertinent  Health Maintenance Due  Topic Date Due   FOOT EXAM  Never done   OPHTHALMOLOGY EXAM  Never done   COLONOSCOPY (Pts 45-79yrs Insurance coverage will need to be confirmed)  Never done   MAMMOGRAM  Never done   DEXA SCAN  Never done   INFLUENZA VACCINE  04/22/2022   HEMOGLOBIN A1C  07/27/2022      01/20/2022    3:45 PM 02/06/2022    6:28 AM 02/06/2022    3:00 PM 02/06/2022    8:10 PM 02/07/2022   11:00 AM  Fall Risk  Falls in the past year? 0      Was there an injury with Fall? 0      Fall Risk Category Calculator 0      Fall Risk Category Low      Patient Fall Risk Level Low fall risk Moderate fall risk Moderate fall risk High fall risk High fall risk  Patient at Risk for Falls Due to No Fall Risks      Fall risk Follow up Falls evaluation completed       Functional Status Survey:    Vitals:   02/20/22 0937  BP: (!) 107/55  Pulse: 75  Resp: 16  Temp: 98.2 F (36.8 C)  SpO2: 98%  Weight: 119 lb (54 kg)  Height: 5\' 7"  (1.702 m)   Body mass index is 18.64 kg/m. Physical Exam Vitals and nursing note reviewed.  Constitutional:      General: She is not in acute distress.    Appearance: She is not diaphoretic.  HENT:     Head: Normocephalic and atraumatic.  Neck:     Vascular: No JVD.  Cardiovascular:     Rate and Rhythm:  Normal rate and regular rhythm.     Heart sounds: No murmur heard. Pulmonary:     Effort: Pulmonary effort is normal. No respiratory distress.     Breath sounds: Normal breath sounds. No wheezing.  Musculoskeletal:     Left lower leg: No edema.     Comments: Right BKA covered with dressing and brace.   Skin:    General: Skin is warm and dry.  Neurological:     Mental Status: She is alert and oriented to person, place, and time.    Labs reviewed: Recent Labs    10/28/21 1039 12/18/21 0000 02/06/22 0612 02/07/22 0222 02/11/22 0000  NA 138   < > 137 137 134*  K 4.2   < > 4.3 4.4 5.0  CL 100   < > 101 104 96*  CO2 32   < > 27 27 21   GLUCOSE 249*  --  277* 248*  --   BUN 24   < > 24* 22 27*  CREATININE 0.99   < > 0.79 0.86 0.7  CALCIUM 9.8   < > 9.4 8.5* 9.1   < > = values in this interval not displayed.   Recent Labs    04/13/21 1825 08/19/21 0000 10/28/21 1039 12/18/21 0000  AST 19 16 19 14   ALT 9 11 17 9   ALKPHOS 79 144*  --  139*  BILITOT 0.4  --  0.4  --   PROT 7.3  --  6.9  --   ALBUMIN 3.8 3.6  --  3.2*   Recent Labs    04/13/21 1825 07/19/21 1102 09/06/21 0937 10/02/21 0725 10/28/21 1039 12/18/21 0000 02/06/22 0612 02/07/22 0222 02/11/22 0000  WBC 7.5   < > 7.1  --  6.5   < > 7.7 7.6 7.7  NEUTROABS 4.4  --  4,395  --  3,562  --   --   --   --   HGB 10.7*   < > 10.1*   < > 10.4*   < > 10.2* 8.5* 11.2*  HCT 32.0*   < > 31.2*   < > 32.3*   < > 31.7* 26.6* 34*  MCV 90.9   < > 91.0  --  88.7  --  88.5 87.5  --   PLT 331   < > 357  --  286   < > 367 325 418*   < > = values in this interval not displayed.   Lab Results  Component Value Date   TSH 2.90 06/10/2021   Lab Results  Component Value Date   HGBA1C 12.7 01/24/2022   Lab Results  Component Value Date   CHOL 179 03/19/2021   HDL 83 (A) 03/19/2021   LDLCALC 86 03/19/2021   TRIG 52 03/19/2021    Significant Diagnostic Results in last 30 days:  No results found.  Assessment/Plan  1.  Other chronic osteomyelitis of right foot ([place]) S/p BKA  2. Status post below-knee amputation of right lower extremity ([place]) Working with therapy Unclear if she will be able to return to AL due to limitations in mobility F/U with surgery today for wound check   3. Type 1 diabetes mellitus with hyperglycemia ([facility]) Improved Continue lantus 20 units and meal coverage Followed by endocrinology  4. Anemia of chronic disease Hgb 8.5 Has a hx of ACD and likely has some blood loss anemia CBC pending.   5. HTN Occasional labile readings Would check with manual cuff when upright If  consistent numbers high or low are noted will address She says she was placed on ace inhibitor for protenuria. Could increase if needed.   Family/ staff Communication: resident   Labs/tests ordered:  CBC BMP pending.

## 2022-03-03 ENCOUNTER — Non-Acute Institutional Stay (SKILLED_NURSING_FACILITY): Payer: Medicare Other | Admitting: Internal Medicine

## 2022-03-03 ENCOUNTER — Encounter: Payer: Self-pay | Admitting: Internal Medicine

## 2022-03-03 DIAGNOSIS — Z89511 Acquired absence of right leg below knee: Secondary | ICD-10-CM | POA: Diagnosis not present

## 2022-03-03 DIAGNOSIS — I10 Essential (primary) hypertension: Secondary | ICD-10-CM

## 2022-03-03 DIAGNOSIS — D638 Anemia in other chronic diseases classified elsewhere: Secondary | ICD-10-CM

## 2022-03-03 DIAGNOSIS — D649 Anemia, unspecified: Secondary | ICD-10-CM

## 2022-03-03 DIAGNOSIS — E785 Hyperlipidemia, unspecified: Secondary | ICD-10-CM

## 2022-03-03 DIAGNOSIS — E1065 Type 1 diabetes mellitus with hyperglycemia: Secondary | ICD-10-CM

## 2022-03-03 NOTE — Progress Notes (Signed)
Location:    [facility] Room Number: [place] of Service:  SNF [phone]) Provider:  Veleta Miners MD  [name], MD  Patient Care Team: [name], MD as PCP - General (Internal Medicine) San Morelle, MD as Referring Physician (Ophthalmology) Atilano Median, Gayleen Orem, MD as Referring Physician (Cardiology) Alyssa Grove, MD as Referring Physician (Cardiology)  Extended Emergency Contact Information Primary Emergency Contact: [name] Address: [address]          [place], [place] [address] [name] of [place] Phone: [phone] Mobile Phone: [phone] Relation: Son Preferred language: English Interpreter needed? No Secondary Emergency Contact: [name],[name] Mobile Phone: [phone] Relation: Relative  Code Status:  Full Code Goals of care: Advanced Directive information    03/03/2022   10:44 AM  Advanced Directives  Does Patient Have a Medical Advance Directive? Yes  Type of Advance Directive [place]  Does patient want to make changes to medical advance directive? No - Patient declined  Copy of [place] in Chart? Yes - validated most recent copy scanned in chart (See row information)     Chief Complaint  Patient presents with   Acute Visit    Nighttime CBGs    HPI:  Pt is a 71 y.o. female seen today for an acute visit for discuss CBGS and her discharge from rehab Patient in Rehab in House after Right BKA for Chronic Osteomyelitis  Patient H/o Type 1 Diabetes For past 50 years. Has been on Pump but now Long Acting Insulin and Novolog now CAD S/o CABG in 2012 osteoporosis 2021 -2.7 Glaucoma with Poor Vision Mild Cognitive impairment  Patient was diagnosed with Osteomyelitis on 7/27 Underwent Right Great toe  amputation on 7/27 Elective Amputation of Right Foot 1St ray by Dr Sharol Given on 10/28 S/p Vascular study by Dr Virl Cagey  MRI by Dr Lucianne Lei dam shows Persistent  Osteomyelitis in Middle and Lateral Cuneiform bones and Metatarsals  Underwent BKA by Dr Doran Durand on 05/18   Has been in [facility] since then Per Therapy and Nurses patient cannot go back to her AL Due to her cognition she is not able to keep up with all the steps of being careful with her BKA Also not able to do her ADLS or tranfers Needs help with her CBGS control Patient very upset about that due to cost and then she does not want help with her ADLS  Patient also was telling how she had 2 episodes of Low BS which were in 63s and she wants to make sure they don't go down anymore  Having more cognition issues  Repeats herself a lot  Having no pain. Does feel her foot sometimes    Past Medical History:  Diagnosis Date   Abnormal thyroid blood test    Per [facility] recevied [facility], Dr.Pearson   Acute deep vein thrombosis of right peroneal vein ([place]) 10/05/2020   RLE DVT   Anxiety    Arthritis    RA   Coronary artery disease    Diabetes type I [facility])    Per [facility] New Patient Packet   Diabetic retinopathy ([place])    Per [facility] New Patient Packet   DKA (diabetic ketoacidosis) ([place]) 2021   Dr.William Zackowski.  Insulin Pump was out of Insulin   Glaucoma    Per [facility] Patient Packet   H/O heart bypass surgery 2012   Dr. Luana Shu, Per [facility] New Patient Packet   Heart murmur  when I was a teenager   History of blood transfusion    History of kidney stones    passed   Hx of CABG    [facility] Dr[facility]Pearson    Left bundle branch block    Mild mitral regurgitation    Per [facility], Dr.Pearson    Neuropathy 1976   [facility]   NSTEMI (non-ST elevated myocardial infarction) (HCC) 09/21/2020   NSTEMI/demand ischemic in setting of DKA, patent grafts by 09/24/20 [facility], continue medical therapy   Osteoporosis    T Score -2.7 , Per [facility] New Patient Packet   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 with diabetes  mellitus due to underlying condition [facility](HCC)    Per Records [facility]ied [facility], Dr.Pearson   PVD (peripheral vascular disease) (HCC) 10/27/2021   Type 1 diabetes mellitus with hyperglycemia (HCC) 10/28/2021   Past Surgical History:  Procedure Laterality Date   ABDOMINAL AORTOGRAM W/LOWER EXTREMITY N/A 10/02/2021   Procedure: ABDOMINAL AORTOGRAM W/LOWER EXTREMITY;  Surgeon: Victorino Sparrowobins, Joshua E, MD;  [facility];  Service: Cardiovascular;  Laterality: N/A;   AMPUTATION Right 04/17/2021   Procedure: RIGHT GREAT TOE AMPUTATION;  Surgeon: Nadara Mustarduda, Marcus V, MD;  [facility];  Service: Orthopedics;  Laterality: Right;   AMPUTATION Right 07/19/2021   Procedure: RIGHT FOOT 1ST RAY AMPUTATION;  Surgeon: Nadara Mustarduda, Marcus V, MD;  [facility];  Service: Orthopedics;  Laterality: Right;   AMPUTATION Right 02/06/2022   Procedure: AMPUTATION BELOW KNEE;  Surgeon: Toni ArthursHewitt, John, MD;  Location: [facility] OR;  Service: Orthopedics;  Laterality: Right;  regional block   CESAREAN SECTION  1970   Dr. Aquilla HackerPaul Bower, [facility]   CESAREAN SECTION  1973   Dr. Aquilla HackerPaul Bower, [facility]   CORONARY ARTERY BYPASS GRAFT      Allergies  Allergen Reactions   Penicillins Anaphylaxis    Allergies as of 03/03/2022       Reactions   Penicillins Anaphylaxis        Medication List        Accurate as of March 03, 2022 10:44 AM. If you have any questions, ask your nurse or doctor.          acetaminophen 500 MG tablet Commonly known as: TYLENOL Take 1,000 mg by mouth 2 (two) times daily as needed for moderate pain.   atorvastatin 40 MG tablet Commonly known as: LIPITOR Take 40 mg by mouth at bedtime.   brimonidine 0.2 % ophthalmic solution Commonly known as: ALPHAGAN Place 1 drop into the left eye 3 (three) times daily.   calcium carbonate 1500 (600 Ca) MG Tabs tablet Commonly known as: OSCAL Take 600 mg of elemental calcium by mouth daily.   clopidogrel 75 MG  tablet Commonly known as: PLAVIX Take 75 mg by mouth in the morning.   docusate sodium 100 MG capsule Commonly known as: COLACE Take 100 mg by mouth daily as needed for mild constipation.   Fish Oil 1000 MG Caps Take 1,000 mg by mouth in the morning.   FreeStyle Libre 14 Day Sensor Misc Inject 1 sensor to the skin every 14 days for continuous glucose monitoring.   furosemide 20 MG tablet Commonly known as: LASIX Take 20 mg by mouth every other day.   glucosamine-chondroitin 500-400 MG tablet Take 1 tablet by mouth every morning.   HAIR/SKIN/NAILS PO Take 1 tablet by mouth in the  morning.   I-VITE PO Take 1 tablet by mouth in the morning.   insulin lispro 100 UNIT/ML KwikPen Commonly known as: HUMALOG Inject 5-15 Units into the skin See admin instructions. If blood sugar is 151-200 = 5 units, 201-250 = 8 units, 251-300  = 10 units, 301-350 = 12 units, 351-400 = 14 units, 401 and above =  16.  If blood sugar is greater than 200 inject 5 units at bedtime, if greater than 500 call on call provider   Lantus SoloStar 100 UNIT/ML Solostar Pen Generic drug: insulin glargine Inject 20 Units into the skin at bedtime. PATIENT SELF ADMINISTERS   latanoprost 0.005 % ophthalmic solution Commonly known as: XALATAN Place 1 drop into both eyes every evening.   lisinopril 2.5 MG tablet Commonly known as: ZESTRIL Take 2.5 mg by mouth in the morning.   miconazole 200 MG vaginal suppository Commonly known as: MICOTIN Place 200 mg vaginally at bedtime as needed (vaginal itching).   nitroGLYCERIN 0.4 MG SL tablet Commonly known as: NITROSTAT Place 0.4 mg under the tongue every 5 (five) minutes x 3 doses as needed for chest pain.   omeprazole 20 MG capsule Commonly known as: PRILOSEC Take 20 mg by mouth daily.   Pen Needles 3/16" 31G X 5 MM Misc 1 Device by Does not apply route 5 (five) times daily. With insulin administration   sennosides-docusate sodium 8.6-50 MG tablet Commonly  known as: SENOKOT-S Take 1 tablet by mouth every other day. IN THE MORNING   timolol 0.5 % ophthalmic solution Commonly known as: TIMOPTIC Place 1 drop into the left eye 2 (two) times daily.        Review of Systems  Constitutional:  Negative for activity change and appetite change.  HENT: Negative.    Respiratory:  Negative for cough and shortness of breath.   Cardiovascular:  Negative for leg swelling.  Gastrointestinal:  Negative for constipation.  Genitourinary: Negative.   Musculoskeletal:  Positive for gait problem. Negative for arthralgias and myalgias.  Skin: Negative.   Neurological:  Negative for dizziness and weakness.  Psychiatric/Behavioral:  Positive for confusion. Negative for dysphoric mood and sleep disturbance.     Immunization History  Administered Date(s) Administered   Influenza, High Dose Seasonal PF 09/01/2005   PFIZER(Purple Top)SARS-COV-2 Vaccination 12/02/2019, 12/26/2019   Tdap 07/11/2014   Pertinent  Health Maintenance Due  Topic Date Due   FOOT EXAM  Never done   OPHTHALMOLOGY EXAM  Never done   COLONOSCOPY (Pts 45-77yrs Insurance coverage will need to be confirmed)  Never done   MAMMOGRAM  Never done   DEXA SCAN  Never done   INFLUENZA VACCINE  04/22/2022   HEMOGLOBIN A1C  07/27/2022      01/20/2022    3:45 PM 02/06/2022    6:28 AM 02/06/2022    3:00 PM 02/06/2022    8:10 PM 02/07/2022   11:00 AM  Fall Risk  Falls in the past year? 0      Was there an injury with Fall? 0      Fall Risk Category Calculator 0      Fall Risk Category Low      Patient Fall Risk Level Low fall risk Moderate fall risk Moderate fall risk High fall risk High fall risk  Patient at Risk for Falls Due to No Fall Risks      Fall risk Follow up Falls evaluation completed       Functional Status Survey:    Vitals:  03/03/22 1034  BP: (!) 145/82  Pulse: 82  Resp: 18  Temp: 98.1 F (36.7 C)  SpO2: 100%  Weight: 121 lb 6.4 oz (55.1 kg)  Height: 5\' 7"  (1.702  m)   Body mass index is 19.01 kg/m. Physical Exam Vitals reviewed.  Constitutional:      Appearance: Normal appearance.  HENT:     Head: Normocephalic.     Nose: Nose normal.     Mouth/Throat:     Mouth: Mucous membranes are moist.     Pharynx: Oropharynx is clear.  Eyes:     Pupils: Pupils are equal, round, and reactive to light.  Cardiovascular:     Rate and Rhythm: Normal rate and regular rhythm.     Pulses: Normal pulses.     Heart sounds: Normal heart sounds. No murmur heard. Pulmonary:     Effort: Pulmonary effort is normal.     Breath sounds: Normal breath sounds.  Abdominal:     General: Abdomen is flat. Bowel sounds are normal.     Palpations: Abdomen is soft.  Musculoskeletal:        General: No swelling.     Cervical back: Neck supple.     Comments: S/p Right BKA  Skin:    General: Skin is warm.  Neurological:     General: No focal deficit present.     Mental Status: She is alert and oriented to person, place, and time.  Psychiatric:        Mood and Affect: Mood normal.        Thought Content: Thought content normal.     Labs reviewed: Recent Labs    10/28/21 1039 12/18/21 0000 02/06/22 0612 02/07/22 0222 02/11/22 0000 02/20/22 0247  NA 138   < > 137 137 134* 143  K 4.2   < > 4.3 4.4 5.0 4.3  CL 100   < > 101 104 96* 103  CO2 32   < > 27 27 21  26*  GLUCOSE 249*  --  277* 248*  --   --   BUN 24   < > 24* 22 27* 23*  CREATININE 0.99   < > 0.79 0.86 0.7 0.7  CALCIUM 9.8   < > 9.4 8.5* 9.1 9.2   < > = values in this interval not displayed.   Recent Labs    04/13/21 1825 08/19/21 0000 10/28/21 1039 12/18/21 0000  AST 19 16 19 14   ALT 9 11 17 9   ALKPHOS 79 144*  --  139*  BILITOT 0.4  --  0.4  --   PROT 7.3  --  6.9  --   ALBUMIN 3.8 3.6  --  3.2*   Recent Labs    04/13/21 1825 07/19/21 1102 09/06/21 0937 10/02/21 0725 10/28/21 1039 12/18/21 0000 02/06/22 0612 02/07/22 0222 02/11/22 0000 02/20/22 0247  WBC 7.5   < > 7.1  --   6.5   < > 7.7 7.6 7.7 6.3  NEUTROABS 4.4  --  4,395  --  3,562  --   --   --   --   --   HGB 10.7*   < > 10.1*   < > 10.4*   < > 10.2* 8.5* 11.2* 11.2*  HCT 32.0*   < > 31.2*   < > 32.3*   < > 31.7* 26.6* 34* 34*  MCV 90.9   < > 91.0  --  88.7  --  88.5 87.5  --   --  PLT 331   < > 357  --  286   < > 367 325 418*  --    < > = values in this interval not displayed.   Lab Results  Component Value Date   TSH 2.90 06/10/2021   Lab Results  Component Value Date   HGBA1C 12.7 01/24/2022   Lab Results  Component Value Date   CHOL 179 03/19/2021   HDL 83 (A) 03/19/2021   LDLCALC 86 03/19/2021   TRIG 52 03/19/2021    Significant Diagnostic Results in last 30 days:  No results found.  Assessment/Plan 1. Status post below-knee amputation of right lower extremity ([place]) No Pain Working with therapy Needing help with her ADLS and Transfers Discussed with therapy and Nurses and she is not safe to go back to her AL room  2. Type 1 diabetes mellitus with hyperglycemia (HCC) Continue Lantus in morning I have reduced her Sliding scale coverage so she not drop less then 100 Most of her sugars are between 120-250 She is getting sliding scale with her meals and HS Overall better controlled sugars right now  3. Primary hypertension BP fluctuating from 140 to 100 Has h/o Orthostatic Will not change the dose today  4. Anemia of chronic disease HGB much better now  5. Hyperlipidemia, unspecified hyperlipidemia type On statin 6 Cognitive impairment Having some worsening in following all detail commands to take care of her BKA  Other issues H/O NSTEMI (non-ST elevated myocardial infarction) ([place]) On Plavix and statin Age-related osteoporosis without current pathological fracture Will Need repeat DEXA in 2023.  On calcium and Vit D Ischemic cardiomyopathy EF 50 % 9/22 On Lisinopril and Lasix Could not tolerate beta blockers due to low BP and dizziness diabetic nephropathy  (HCC) Had Proteinuria per patient On Low dose of ACE inibitor Primary open angle glaucoma (POAG) of both eyes, moderate stage Poor Vision Follows with Opthalmologist H/o Superficial DVT in setting of Acute illness Was treated with Elquis for 3 motnhs  Family/ staff Communication:  Total time spent in this patient care encounter was  45_  minutes; greater than 50% of the visit spent counseling patient and staff, reviewing records , Labs and coordinating care for problems addressed at this encounter.    Labs/tests ordered:

## 2022-03-17 ENCOUNTER — Encounter: Payer: Self-pay | Admitting: Internal Medicine

## 2022-03-17 DIAGNOSIS — E1065 Type 1 diabetes mellitus with hyperglycemia: Secondary | ICD-10-CM

## 2022-03-17 DIAGNOSIS — I10 Essential (primary) hypertension: Secondary | ICD-10-CM

## 2022-03-17 DIAGNOSIS — L989 Disorder of the skin and subcutaneous tissue, unspecified: Secondary | ICD-10-CM

## 2022-03-17 DIAGNOSIS — Z89511 Acquired absence of right leg below knee: Secondary | ICD-10-CM

## 2022-03-17 LAB — CBC: RBC: 3.59 — AB (ref 3.87–5.11)

## 2022-03-17 LAB — CBC AND DIFFERENTIAL
HCT: 31 — AB (ref 36–46)
Hemoglobin: 10.3 — AB (ref 12.0–16.0)
Platelets: 288 10*3/uL (ref 150–400)
WBC: 6.4

## 2022-03-17 NOTE — Progress Notes (Signed)
err Location:   [facility] Room Number: 154 Place of Service:  SNF [phone]) Provider:  Einar Crow MD   [name], MD  Patient Care Team: [name], MD as PCP - General (Internal Medicine) [name], MD as Referring Physician (Ophthalmology) Beverely Pace, [name], MD as Referring Physician (Cardiology) Bertram Gala, MD as Referring Physician (Cardiology)  Extended Emergency Contact Information Primary Emergency Contact: [name] Address: [address]          [place], [place] [address] [name] of [place] Home Phone: [phone] Mobile Phone: [phone] Relation: Son Preferred language: English Interpreter needed? No Secondary Emergency Contact: [name],[name] Mobile Phone: [phone] Relation: Relative  Code Status:  Full Code Goals of care: Advanced Directive information    03/17/2022    9:21 AM  Advanced Directives  Does Patient Have a Medical Advance Directive? Yes  Type of Advance Directive Healthcare Power of Attorney  Does patient want to make changes to medical advance directive? No - Patient declined  Copy of Healthcare Power of Attorney in Chart? Yes - validated most recent copy scanned in chart (See row information)     Chief Complaint  Patient presents with   Acute Visit    Diabetes and Left Leg Blister    HPI:  Pt is a 71 y.o. female seen today for an acute visit for CBGS and Left Leg Blister  Patient in Rehab in [facility] after Right BKA for Chronic Osteomyelitis   Patient H/o Type 1 Diabetes For past 50 years. Has been on Pump but now Long Acting Insulin and Novolog now CAD S/o CABG in 2012 osteoporosis 2021 -2.7 Glaucoma with Poor Vision Mild Cognitive impairment   Underwent BKA by Dr Victorino Dike on 05/18 for Osteomyelitis  Is in Rehab Seen today to evaluate      Past Medical History:  Diagnosis Date   Abnormal thyroid blood test    Per Records recevied from [facility],  Dr.Pearson   Acute deep vein thrombosis of right peroneal vein (HCC) 10/05/2020   RLE DVT   Anxiety    Arthritis    RA   Coronary artery disease    Diabetes type I [facility])    Per [facility] New Patient Packet   Diabetic retinopathy (HCC)    Per [facility] New Patient Packet   DKA (diabetic ketoacidosis) (HCC) 2021   Dr.William Zackowski.  Insulin Pump was out of Insulin   Glaucoma    Per [facility] New Patient Packet   H/O heart bypass surgery 2012   Dr. Excell Seltzer, Per [facility] New Patient Packet   Heart murmur    when I was a teenager   History of blood transfusion    History of kidney stones    passed   Hx of CABG    Per records received form [facility], Dr.Pearson    Left bundle branch block    Mild mitral regurgitation    Per records from [facility], Dr.Pearson    Neuropathy 1976   Per [facility] New Patient Packet   NSTEMI (non-ST elevated myocardial infarction) (HCC) 09/21/2020   NSTEMI/demand ischemic in setting of DKA, patent grafts by 09/24/20 LHC, continue medical therapy   Osteoporosis    T Score -2.7 , Per [facility] New Patient Packet   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 due to underlying condition [facility])    Per Records recevied from [facility], Dr.Pearson   PVD (peripheral vascular disease) (HCC) 10/27/2021   Type 1  diabetes mellitus with hyperglycemia (HCC) 10/28/2021   Past Surgical History:  Procedure Laterality Date   ABDOMINAL AORTOGRAM W/LOWER EXTREMITY N/A 10/02/2021   Procedure: ABDOMINAL AORTOGRAM W/LOWER EXTREMITY;  Surgeon: Victorino Sparrow, MD;  Location: [facility] INVASIVE CV LAB;  Service: Cardiovascular;  Laterality: N/A;   AMPUTATION Right 04/17/2021   Procedure: RIGHT GREAT TOE AMPUTATION;  Surgeon: [name], MD;  Location: [facility] OR;  Service: Orthopedics;  Laterality: Right;   AMPUTATION Right 07/19/2021   Procedure: RIGHT FOOT 1ST RAY AMPUTATION;  Surgeon: [name], MD;  Location: [facility] OR;  Service: Orthopedics;  Laterality: Right;    AMPUTATION Right 02/06/2022   Procedure: AMPUTATION BELOW KNEE;  Surgeon: Toni Arthurs, MD;  Location: [facility] OR;  Service: Orthopedics;  Laterality: Right;  regional block   CESAREAN SECTION  1970   Dr. Aquilla Hacker, Per [facility] New Patient Packet   CESAREAN SECTION  1973   Dr. Aquilla Hacker, Per [facility] New Patient Packet   CORONARY ARTERY BYPASS GRAFT      Allergies  Allergen Reactions   Penicillins Anaphylaxis    Allergies as of 03/17/2022       Reactions   Penicillins Anaphylaxis        Medication List        Accurate as of March 17, 2022  9:21 AM. If you have any questions, ask your nurse or doctor.          acetaminophen 500 MG tablet Commonly known as: TYLENOL Take 1,000 mg by mouth 2 (two) times daily as needed for moderate pain.   atorvastatin 40 MG tablet Commonly known as: LIPITOR Take 40 mg by mouth at bedtime.   brimonidine 0.2 % ophthalmic solution Commonly known as: ALPHAGAN Place 1 drop into the left eye 3 (three) times daily.   calcium carbonate 1500 (600 Ca) MG Tabs tablet Commonly known as: OSCAL Take 600 mg of elemental calcium by mouth daily.   clopidogrel 75 MG tablet Commonly known as: PLAVIX Take 75 mg by mouth in the morning.   docusate sodium 100 MG capsule Commonly known as: COLACE Take 100 mg by mouth daily as needed for mild constipation.   Fish Oil 1000 MG Caps Take 1,000 mg by mouth in the morning.   FreeStyle Libre 14 Day Sensor Misc Inject 1 sensor to the skin every 14 days for continuous glucose monitoring.   furosemide 20 MG tablet Commonly known as: LASIX Take 20 mg by mouth every other day.   glucosamine-chondroitin 500-400 MG tablet Take 1 tablet by mouth every morning.   HAIR/SKIN/NAILS PO Take 1 tablet by mouth in the morning.   I-VITE PO Take 1 tablet by mouth in the morning.   insulin lispro 100 UNIT/ML KwikPen Commonly known as: HUMALOG Inject 5-15 Units into the skin See admin instructions. If blood sugar is 151-200  = 5 units, 201-250 = 8 units, 251-300  = 10 units, 301-350 = 12 units, 351-400 = 14 units, 401 and above =  16.  If blood sugar is greater than 200 inject 5 units at bedtime, if greater than 500 call on call provider   Lantus SoloStar 100 UNIT/ML Solostar Pen Generic drug: insulin glargine Inject 20 Units into the skin at bedtime. PATIENT SELF ADMINISTERS   latanoprost 0.005 % ophthalmic solution Commonly known as: XALATAN Place 1 drop into both eyes every evening.   lisinopril 2.5 MG tablet Commonly known as: ZESTRIL Take 2.5 mg by mouth in the morning.   miconazole 200  MG vaginal suppository Commonly known as: MICOTIN Place 200 mg vaginally at bedtime as needed (vaginal itching).   nitroGLYCERIN 0.4 MG SL tablet Commonly known as: NITROSTAT Place 0.4 mg under the tongue every 5 (five) minutes x 3 doses as needed for chest pain.   omeprazole 20 MG capsule Commonly known as: PRILOSEC Take 20 mg by mouth daily.   Pen Needles 3/16" 31G X 5 MM Misc 1 Device by Does not apply route 5 (five) times daily. With insulin administration   sennosides-docusate sodium 8.6-50 MG tablet Commonly known as: SENOKOT-S Take 1 tablet by mouth every other day. IN THE MORNING   timolol 0.5 % ophthalmic solution Commonly known as: TIMOPTIC Place 1 drop into the left eye 2 (two) times daily.        Review of Systems  Immunization History  Administered Date(s) Administered   Influenza, High Dose Seasonal PF 09/01/2005   PFIZER(Purple Top)SARS-COV-2 Vaccination 12/02/2019, 12/26/2019   Tdap 07/11/2014   Pertinent  Health Maintenance Due  Topic Date Due   FOOT EXAM  Never done   OPHTHALMOLOGY EXAM  Never done   COLONOSCOPY (Pts 45-44yrs Insurance coverage will need to be confirmed)  Never done   MAMMOGRAM  Never done   DEXA SCAN  Never done   INFLUENZA VACCINE  04/22/2022   HEMOGLOBIN A1C  07/27/2022      01/20/2022    3:45 PM 02/06/2022    6:28 AM 02/06/2022    3:00 PM 02/06/2022     8:10 PM 02/07/2022   11:00 AM  Fall Risk  Falls in the past year? 0      Was there an injury with Fall? 0      Fall Risk Category Calculator 0      Fall Risk Category Low      Patient Fall Risk Level Low fall risk Moderate fall risk Moderate fall risk High fall risk High fall risk  Patient at Risk for Falls Due to No Fall Risks      Fall risk Follow up Falls evaluation completed       Functional Status Survey:    Vitals:   03/17/22 0909  BP: 114/65  Pulse: 86  Resp: 20  Temp: (!) 96.8 F (36 C)  SpO2: 98%  Weight: 125 lb 3.2 oz (56.8 kg)  Height: 5\' 7"  (1.702 m)   Body mass index is 19.61 kg/m. Physical Exam  Labs reviewed: Recent Labs    10/28/21 1039 12/18/21 0000 02/06/22 0612 02/07/22 0222 02/11/22 0000 02/20/22 0000 02/20/22 0247  NA 138   < > 137 137 134* 143  --   K 4.2   < > 4.3 4.4 5.0 4.3  --   CL 100   < > 101 104 96* 103  --   CO2 32   < > 27 27 21  26*  --   GLUCOSE 249*  --  277* 248*  --   --   --   BUN 24   < > 24* 22 27* 23*  --   CREATININE 0.99   < > 0.79 0.86 0.7 0.7  --   CALCIUM 9.8   < > 9.4 8.5* 9.1  --  9.2   < > = values in this interval not displayed.   Recent Labs    04/13/21 1825 08/19/21 0000 10/28/21 1039 12/18/21 0000  AST 19 16 19 14   ALT 9 11 17 9   ALKPHOS 79 144*  --  139*  BILITOT 0.4  --  0.4  --   PROT 7.3  --  6.9  --   ALBUMIN 3.8 3.6  --  3.2*   Recent Labs    04/13/21 1825 07/19/21 1102 09/06/21 0937 10/02/21 0725 10/28/21 1039 12/18/21 0000 02/06/22 0612 02/07/22 0222 02/11/22 0000 02/20/22 0000  WBC 7.5   < > 7.1  --  6.5   < > 7.7 7.6 7.7 6.3  NEUTROABS 4.4  --  4,395  --  3,562  --   --   --   --   --   HGB 10.7*   < > 10.1*   < > 10.4*   < > 10.2* 8.5* 11.2* 11.2*  HCT 32.0*   < > 31.2*   < > 32.3*   < > 31.7* 26.6* 34* 34*  MCV 90.9   < > 91.0  --  88.7  --  88.5 87.5  --   --   PLT 331   < > 357  --  286   < > 367 325 418*  --    < > = values in this interval not displayed.   Lab Results   Component Value Date   TSH 2.90 06/10/2021   Lab Results  Component Value Date   HGBA1C 12.7 01/24/2022   Lab Results  Component Value Date   CHOL 179 03/19/2021   HDL 83 (A) 03/19/2021   LDLCALC 86 03/19/2021   TRIG 52 03/19/2021    Significant Diagnostic Results in last 30 days:  No results found.  Assessment/Plan There are no diagnoses linked to this encounter.   Family/ staff Communication:   Labs/tests ordered:

## 2022-03-28 ENCOUNTER — Telehealth: Payer: Self-pay | Admitting: Adult Health

## 2022-03-28 DIAGNOSIS — N3000 Acute cystitis without hematuria: Secondary | ICD-10-CM

## 2022-03-28 MED ORDER — NITROFURANTOIN MONOHYD MACRO 100 MG PO CAPS: 100.0000 mg | ORAL_CAPSULE | Freq: Two times a day (BID) | ORAL | 0 refills | Status: DC

## 2022-03-28 NOTE — Telephone Encounter (Signed)
UA C and S report returned with >100,000 colonies of gram pos cocci Sensitivity pending Pt has anaphylaxis with pcn, will avoid cephalosporins. Has no symptoms other than large volume of urine Will start macrobid

## 2022-04-07 ENCOUNTER — Non-Acute Institutional Stay: Payer: Medicare Other | Admitting: Internal Medicine

## 2022-04-07 ENCOUNTER — Encounter: Payer: Self-pay | Admitting: Internal Medicine

## 2022-04-07 DIAGNOSIS — R350 Frequency of micturition: Secondary | ICD-10-CM

## 2022-04-07 DIAGNOSIS — L84 Corns and callosities: Secondary | ICD-10-CM

## 2022-04-07 DIAGNOSIS — E1065 Type 1 diabetes mellitus with hyperglycemia: Secondary | ICD-10-CM

## 2022-04-07 NOTE — Progress Notes (Unsigned)
Location:    [facility] Room Number: 628 Place of Service:  ALF [phone]) Provider:  Einar Crow MD  [name], MD  Patient Care Team: [name], MD as PCP - General (Internal Medicine) [name], MD as Referring Physician (Ophthalmology) Beverely Pace, [name], MD as Referring Physician (Cardiology) Bertram Gala, MD as Referring Physician (Cardiology)  Extended Emergency Contact Information Primary Emergency Contact: [name] Address: [address]          [place], [place] [address] [name] of [place] Home Phone: [phone] Mobile Phone: [phone] Relation: Son Preferred language: English Interpreter needed? No Secondary Emergency Contact: [name],[name] Mobile Phone: [phone] Relation: Relative  Code Status:  Full Code Goals of care: Advanced Directive information    04/07/2022   11:30 AM  Advanced Directives  Does Patient Have a Medical Advance Directive? Yes  Type of Advance Directive Healthcare Power of Attorney  Does patient want to make changes to medical advance directive? No - Patient declined  Copy of Healthcare Power of Attorney in Chart? Yes - validated most recent copy scanned in chart (See row information)     Chief Complaint  Patient presents with   Acute Visit    HPI:  Pt is a 71 y.o. female seen today for an acute visit for Left Great toe Lesion  Lives in [place] in [facility]  Patient H/o Type 1 Diabetes For past 50 years. Has been on Pump but now Long Acting Insulin and Novolog now CAD S/o CABG in 2012 osteoporosis 2021 -2.7 Glaucoma with Poor Vision Mild Cognitive impairment    Underwent BKA by Dr Victorino Dike on 05/18 For Chronic Osteomyelitis  Noticed to have a lesion in her Left Toe  Was told to see Podiatry but  Patient refused She has no pain or  discomfort It was seen by Dr Victorino Dike in April and diagnosed with Diabetic Ulcer but no Intervention suggested Her Great toe has some  blackness below the Nail  Her other issue is Urinary Frequency/ A lot of Urine  per patient  She was diagnosed with Enterococcus UTI. Treated with Macrobid Repeat Cultures were negative She denies any pain or dysuria or Urgency Just Frequency during daytime   Past Medical History:  Diagnosis Date   Abnormal thyroid blood test    Per Records recevied from [facility], Dr.Pearson   Acute deep vein thrombosis of right peroneal vein (HCC) 10/05/2020   RLE DVT   Anxiety    Arthritis    RA   Coronary artery disease    Diabetes type I [facility])    Per [facility] New Patient Packet   Diabetic retinopathy (HCC)    Per [facility] New Patient Packet   DKA (diabetic ketoacidosis) (HCC) 2021   Dr.William Zackowski.  Insulin Pump was out of Insulin   Glaucoma    Per [facility] New Patient Packet   H/O heart bypass surgery 2012   Dr. Excell Seltzer, Per [facility] New Patient Packet   Heart murmur    when I was a teenager   History of blood transfusion    History of kidney stones    passed   Hx of CABG    Per records received form [facility], Dr.Pearson    Left bundle branch block    Mild mitral regurgitation    Per records from [facility], Dr.Pearson    Neuropathy 1976   Per [facility] New Patient Packet   NSTEMI (non-ST elevated myocardial infarction) (HCC) 09/21/2020   NSTEMI/demand ischemic  in setting of DKA, patent grafts by 09/24/20 [facility], continue medical therapy   Osteoporosis    T Score -2.7 , Per [facility] New Patient Packet   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 due to underlying condition [facility])    Per Records recevied from [facility], Dr.Pearson   PVD (peripheral vascular disease) (HCC) 10/27/2021   Type 1 diabetes mellitus with hyperglycemia (HCC) 10/28/2021   Past Surgical History:  Procedure Laterality Date   ABDOMINAL AORTOGRAM W/LOWER EXTREMITY N/A 10/02/2021   Procedure: ABDOMINAL AORTOGRAM W/LOWER EXTREMITY;  Surgeon: Victorino Sparrow, MD;  Location: [facility] INVASIVE CV LAB;   Service: Cardiovascular;  Laterality: N/A;   AMPUTATION Right 04/17/2021   Procedure: RIGHT GREAT TOE AMPUTATION;  Surgeon: [name], MD;  Location: [facility] OR;  Service: Orthopedics;  Laterality: Right;   AMPUTATION Right 07/19/2021   Procedure: RIGHT FOOT 1ST RAY AMPUTATION;  Surgeon: [name], MD;  Location: [facility] OR;  Service: Orthopedics;  Laterality: Right;   AMPUTATION Right 02/06/2022   Procedure: AMPUTATION BELOW KNEE;  Surgeon: Toni Arthurs, MD;  Location: [facility] OR;  Service: Orthopedics;  Laterality: Right;  regional block   CESAREAN SECTION  1970   Dr. Aquilla Hacker, Per [facility] New Patient Packet   CESAREAN SECTION  1973   Dr. Aquilla Hacker, Per [facility] New Patient Packet   CORONARY ARTERY BYPASS GRAFT      Allergies  Allergen Reactions   Penicillins Anaphylaxis    Allergies as of 04/07/2022       Reactions   Penicillins Anaphylaxis        Medication List        Accurate as of April 07, 2022 11:31 AM. If you have any questions, ask your nurse or doctor.          STOP taking these medications    nitrofurantoin (macrocrystal-monohydrate) 100 MG capsule Commonly known as: Macrobid Stopped by: [name], MD       TAKE these medications    acetaminophen 500 MG tablet Commonly known as: TYLENOL Take 1,000 mg by mouth 2 (two) times daily as needed for moderate pain.   atorvastatin 40 MG tablet Commonly known as: LIPITOR Take 40 mg by mouth at bedtime.   brimonidine 0.2 % ophthalmic solution Commonly known as: ALPHAGAN Place 1 drop into the left eye 3 (three) times daily.   calcium carbonate 1500 (600 Ca) MG Tabs tablet Commonly known as: OSCAL Take 600 mg of elemental calcium by mouth daily.   clopidogrel 75 MG tablet Commonly known as: PLAVIX Take 75 mg by mouth in the morning.   docusate sodium 100 MG capsule Commonly known as: COLACE Take 100 mg by mouth daily as needed for mild constipation.   Fish Oil 1000 MG Caps Take 1,000 mg by mouth in the  morning.   FreeStyle Libre 14 Day Sensor Misc Inject 1 sensor to the skin every 14 days for continuous glucose monitoring.   furosemide 20 MG tablet Commonly known as: LASIX Take 20 mg by mouth every other day.   glucosamine-chondroitin 500-400 MG tablet Take 1 tablet by mouth every morning.   HAIR/SKIN/NAILS PO Take 1 tablet by mouth in the morning.   I-VITE PO Take 1 tablet by mouth in the morning.   insulin lispro 100 UNIT/ML KwikPen Commonly known as: HUMALOG Inject 5-15 Units into the skin See admin instructions. If blood sugar is 151-200 = 5 units, 201-250 = 8 units, 251-300  = 10 units,  301-350 = 12 units, 351-400 = 14 units, 401 and above =  16.  If blood sugar is greater than 200 inject 5 units at bedtime, if greater than 500 call on call provider   Lantus SoloStar 100 UNIT/ML Solostar Pen Generic drug: insulin glargine Inject 22 Units into the skin at bedtime. PATIENT SELF ADMINISTERS   latanoprost 0.005 % ophthalmic solution Commonly known as: XALATAN Place 1 drop into both eyes every evening.   lisinopril 2.5 MG tablet Commonly known as: ZESTRIL Take 2.5 mg by mouth in the morning.   miconazole 200 MG vaginal suppository Commonly known as: MICOTIN Place 200 mg vaginally at bedtime as needed (vaginal itching).   nitroGLYCERIN 0.4 MG SL tablet Commonly known as: NITROSTAT Place 0.4 mg under the tongue every 5 (five) minutes x 3 doses as needed for chest pain.   omeprazole 20 MG capsule Commonly known as: PRILOSEC Take 20 mg by mouth daily.   Pen Needles 3/16" 31G X 5 MM Misc 1 Device by Does not apply route 5 (five) times daily. With insulin administration   sennosides-docusate sodium 8.6-50 MG tablet Commonly known as: SENOKOT-S Take 1 tablet by mouth every other day. IN THE MORNING   timolol 0.5 % ophthalmic solution Commonly known as: TIMOPTIC Place 1 drop into the left eye 2 (two) times daily.        Review of Systems  Constitutional:   Negative for activity change and appetite change.  HENT: Negative.    Respiratory:  Negative for cough and shortness of breath.   Cardiovascular:  Negative for leg swelling.  Gastrointestinal:  Negative for constipation.  Genitourinary:  Positive for frequency.  Musculoskeletal:  Positive for gait problem. Negative for arthralgias and myalgias.  Skin: Negative.   Neurological:  Negative for dizziness and weakness.  Psychiatric/Behavioral:  Negative for confusion, dysphoric mood and sleep disturbance.     Immunization History  Administered Date(s) Administered   Influenza, High Dose Seasonal PF 09/01/2005   PFIZER(Purple Top)SARS-COV-2 Vaccination 12/02/2019, 12/26/2019   Tdap 07/11/2014   Pertinent  Health Maintenance Due  Topic Date Due   FOOT EXAM  Never done   OPHTHALMOLOGY EXAM  Never done   COLONOSCOPY (Pts 45-66yrs Insurance coverage will need to be confirmed)  Never done   MAMMOGRAM  Never done   DEXA SCAN  Never done   INFLUENZA VACCINE  04/22/2022   HEMOGLOBIN A1C  07/27/2022      01/20/2022    3:45 PM 02/06/2022    6:28 AM 02/06/2022    3:00 PM 02/06/2022    8:10 PM 02/07/2022   11:00 AM  Fall Risk  Falls in the past year? 0      Was there an injury with Fall? 0      Fall Risk Category Calculator 0      Fall Risk Category Low      Patient Fall Risk Level Low fall risk Moderate fall risk Moderate fall risk High fall risk High fall risk  Patient at Risk for Falls Due to No Fall Risks      Fall risk Follow up Falls evaluation completed       Functional Status Survey:    Vitals:   04/07/22 1117  BP: (!) 147/70  Pulse: 82  Resp: 20  Temp: (!) 97.5 F (36.4 C)  SpO2: 99%  Weight: 142 lb (64.4 kg)  Height: 5\' 7"  (1.702 m)   Body mass index is 22.24 kg/m. Physical Exam Vitals reviewed.  Constitutional:  Appearance: Normal appearance.  HENT:     Head: Normocephalic.     Nose: Nose normal.     Mouth/Throat:     Mouth: Mucous membranes are moist.      Pharynx: Oropharynx is clear.  Eyes:     Pupils: Pupils are equal, round, and reactive to light.  Cardiovascular:     Rate and Rhythm: Normal rate and regular rhythm.     Pulses: Normal pulses.     Heart sounds: Normal heart sounds. No murmur heard. Pulmonary:     Effort: Pulmonary effort is normal.     Breath sounds: Normal breath sounds.  Abdominal:     General: Abdomen is flat. Bowel sounds are normal.     Palpations: Abdomen is soft.  Musculoskeletal:        General: No swelling.     Cervical back: Neck supple.     Comments: S/p BKA in Right  Left leg no edema  A small Lesion in Left great Toe. Feels more like Callus with no Redness discharge Pain Has good pulses in her Foot  Skin:    General: Skin is warm.  Neurological:     General: No focal deficit present.     Mental Status: She is alert and oriented to person, place, and time.  Psychiatric:        Mood and Affect: Mood normal.        Thought Content: Thought content normal.     Labs reviewed: Recent Labs    10/28/21 1039 12/18/21 0000 02/06/22 0612 02/07/22 0222 02/11/22 0000 02/20/22 0000 02/20/22 0247  NA 138   < > 137 137 134* 143  --   K 4.2   < > 4.3 4.4 5.0 4.3  --   CL 100   < > 101 104 96* 103  --   CO2 32   < > 27 27 21  26*  --   GLUCOSE 249*  --  277* 248*  --   --   --   BUN 24   < > 24* 22 27* 23*  --   CREATININE 0.99   < > 0.79 0.86 0.7 0.7  --   CALCIUM 9.8   < > 9.4 8.5* 9.1  --  9.2   < > = values in this interval not displayed.   Recent Labs    04/13/21 1825 08/19/21 0000 10/28/21 1039 12/18/21 0000  AST 19 16 19 14   ALT 9 11 17 9   ALKPHOS 79 144*  --  139*  BILITOT 0.4  --  0.4  --   PROT 7.3  --  6.9  --   ALBUMIN 3.8 3.6  --  3.2*   Recent Labs    04/13/21 1825 07/19/21 1102 09/06/21 0937 10/02/21 0725 10/28/21 1039 12/18/21 0000 02/06/22 0612 02/07/22 0222 02/11/22 0000 02/20/22 0000 03/17/22 0000  WBC 7.5   < > 7.1  --  6.5   < > 7.7 7.6 7.7 6.3 6.4   NEUTROABS 4.4  --  4,395  --  3,562  --   --   --   --   --   --   HGB 10.7*   < > 10.1*   < > 10.4*   < > 10.2* 8.5* 11.2* 11.2* 10.3*  HCT 32.0*   < > 31.2*   < > 32.3*   < > 31.7* 26.6* 34* 34* 31*  MCV 90.9   < > 91.0  --  88.7  --  88.5 87.5  --   --   --   PLT 331   < > 357  --  286   < > 367 325 418*  --  288   < > = values in this interval not displayed.   Lab Results  Component Value Date   TSH 2.90 06/10/2021   Lab Results  Component Value Date   HGBA1C 12.7 01/24/2022   Lab Results  Component Value Date   CHOL 179 03/19/2021   HDL 83 (A) 03/19/2021   LDLCALC 86 03/19/2021   TRIG 52 03/19/2021    Significant Diagnostic Results in last 30 days:  No results found.  Assessment/Plan Corn of toe Will not do anything yet Regulatory affairs officer at her request Urinary frequency Possible due to Lasix  Will discontinue lasix Continue to watch for Weight gain Nurses will let us know No Edema today Type 1 diabetes mellitus with hyperglycemia (HCC) CBGS doing well with Sliding scale Varies from 120 in the morning to 300 with meals Has appointment with her endocrinologist in few weeks Will wait for his input  Other issues Status post below-knee amputation of right lower extremity (HCC) No Pain Working with therapy Anemia of chronic disease HGB much better now    Hyperlipidemia, unspecified hyperlipidemia type On statin Cognitive impairment     H/O NSTEMI (non-ST elevated myocardial infarction) (HCC) On Plavix and statin Age-related osteoporosis without current pathological fracture Will Need repeat DEXA in 2023.  On calcium and Vit D Ischemic cardiomyopathy EF 50 % 9/22 On Lisinopril and Lasix Could not tolerate beta blockers due to low BP and dizziness diabetic nephropathy (HCC) Had Proteinuria per patient On Low dose of ACE inibitor Primary open angle glaucoma (POAG) of both eyes, moderate stage Poor Vision Follows with Opthalmologist     Family/ staff Communication:   Labs/tests ordered:

## 2022-04-21 ENCOUNTER — Encounter: Payer: Self-pay | Admitting: Adult Health

## 2022-04-21 ENCOUNTER — Non-Acute Institutional Stay: Payer: Medicare Other | Admitting: Adult Health

## 2022-04-21 VITALS — BP 104/72 | HR 70 | Temp 97.0°F | Ht 67.0 in | Wt 130.0 lb

## 2022-04-21 DIAGNOSIS — D638 Anemia in other chronic diseases classified elsewhere: Secondary | ICD-10-CM

## 2022-04-21 DIAGNOSIS — I255 Ischemic cardiomyopathy: Secondary | ICD-10-CM | POA: Diagnosis not present

## 2022-04-21 DIAGNOSIS — L84 Corns and callosities: Secondary | ICD-10-CM

## 2022-04-21 DIAGNOSIS — E1021 Type 1 diabetes mellitus with diabetic nephropathy: Secondary | ICD-10-CM | POA: Diagnosis not present

## 2022-04-21 DIAGNOSIS — R5383 Other fatigue: Secondary | ICD-10-CM | POA: Diagnosis not present

## 2022-04-21 NOTE — Progress Notes (Unsigned)
Location:  [facility]  POS: Clinic  Provider: Fletcher Anon, ANP  Code Status: Full code Goals of Care:     04/21/2022    2:53 PM  Advanced Directives  Does Patient Have a Medical Advance Directive? Yes  Type of Advance Directive Healthcare Power of Attorney  Does patient want to make changes to medical advance directive? No - Patient declined  Copy of Healthcare Power of Attorney in Chart? Yes - validated most recent copy scanned in chart (See row information)     Chief Complaint  Patient presents with   Acute Visit    Patient here today to discuss her blood sugar.     HPI: Patient is a 71 y.o. female seen today for discussion regarding blood sugar.    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 Worked with therapy and then transferred to assisted living. She has been a Type 1 DM for over 50 years. Used to be on a insulin pump now on lantus and meal coverage.   She refused lantus this morning due to concern for low reading Also it was held on 7/29 for CBG reading 98.    She feels like something is off. She is falling asleep in the afternoon. We do not have a low reading of her blood sugar to correlate this symptom with hypoglycemia  AM [phone]  Lunch  120-376  Dinner 125-258  Lantus changed to 22 units 6/26 from 25 units due to low readings, SSI adjusted. She is still concerned about low readings an  Taken off lasix due to excess urination 7/17. Noted hx of ischemic cardiomyopathy with EF 50% 9/22. On ace. Could not tolerate beta blockers due to low bp and dizziness.   She has a dark spot to her left great toe. This area was xrayed.  Xray of left toe 04/08/22 done due to dark area Which showed no acute fracture or dislocation, old healed fracture of the distal diaphysis of the proximal phalanx, mild OA, no overt osteomyelitis.  Had ABIs 09/27/21 which showed normal left toe brachial index Pt does not want to see podiatry   Past Medical History:  Diagnosis Date   Abnormal thyroid blood test    Per [facility] recevied from [facility], Dr.Pearson   Acute deep vein thrombosis of right peroneal vein (HCC) 10/05/2020   RLE DVT   Anxiety    Arthritis    RA   Coronary artery disease    Diabetes type I [facility])    Per [facility] New Patient Packet   Diabetic retinopathy (HCC)    Per [facility] New Patient Packet   DKA (diabetic ketoacidosis) (HCC) 2021   Dr.William Zackowski.  Insulin Pump was out of Insulin   Glaucoma    Per [facility] New Patient Packet   H/O heart bypass surgery 2012   Dr. Excell Seltzer, Per [facility] New Patient Packet   Heart murmur    when I was a teenager   History of blood transfusion    History of kidney stones    passed   Hx of CABG    Per records received form [facility], Dr.Pearson    Left bundle branch block    Mild mitral regurgitation    Per records from [facility], Dr.Pearson    Neuropathy 1976   Per [facility] New Patient Packet   NSTEMI (non-ST elevated myocardial infarction) (HCC) 09/21/2020   NSTEMI/demand ischemic in setting of DKA, patent grafts by 09/24/20  LHC, continue medical therapy   Osteoporosis    T Score -2.7 , Per [facility] New Patient Packet   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 due to underlying condition [facility])    Per Records recevied from [facility], Dr.Pearson   PVD (peripheral vascular disease) (HCC) 10/27/2021   Type 1 diabetes mellitus with hyperglycemia (HCC) 10/28/2021    Past Surgical History:  Procedure Laterality Date   ABDOMINAL AORTOGRAM W/LOWER EXTREMITY N/A 10/02/2021   Procedure: ABDOMINAL AORTOGRAM W/LOWER EXTREMITY;  Surgeon: Victorino Sparrow, MD;  Location: [facility] INVASIVE CV LAB;  Service: Cardiovascular;  Laterality: N/A;   AMPUTATION Right 04/17/2021   Procedure: RIGHT GREAT TOE AMPUTATION;  Surgeon: [name], MD;  Location: [facility] OR;  Service: Orthopedics;  Laterality: Right;   AMPUTATION Right 07/19/2021   Procedure: RIGHT FOOT 1ST  RAY AMPUTATION;  Surgeon: [name], MD;  Location: [facility] OR;  Service: Orthopedics;  Laterality: Right;   AMPUTATION Right 02/06/2022   Procedure: AMPUTATION BELOW KNEE;  Surgeon: Toni Arthurs, MD;  [facility]: [facility] OR;  Service: Orthopedics;  Laterality: Right;  regional block   CESAREAN SECTION  1970   Dr. Aquilla Hacker, Per [facility] New Patient Packet   CESAREAN SECTION  1973   Dr. Aquilla Hacker, Per [facility] New Patient Packet   CORONARY ARTERY BYPASS GRAFT      Allergies  Allergen Reactions   Penicillins Anaphylaxis    Outpatient Encounter Medications as of 04/21/2022  Medication Sig   acetaminophen (TYLENOL) 500 MG tablet Take 1,000 mg by mouth 2 (two) times daily as needed for moderate pain.   atorvastatin (LIPITOR) 40 MG tablet Take 40 mg by mouth at bedtime.   Biotin w/ Vitamins C & E (HAIR/SKIN/NAILS PO) Take 1 tablet by mouth in the morning.   brimonidine (ALPHAGAN) 0.2 % ophthalmic solution Place 1 drop into the left eye 3 (three) times daily.   calcium carbonate (OSCAL) 1500 (600 Ca) MG TABS tablet Take 600 mg of elemental calcium by mouth daily.   clopidogrel (PLAVIX) 75 MG tablet Take 75 mg by mouth in the morning.   Continuous Blood Gluc Sensor (FREESTYLE LIBRE 14 DAY SENSOR) MISC Inject 1 sensor to the skin every 14 days for continuous glucose monitoring.   docusate sodium (COLACE) 100 MG capsule Take 100 mg by mouth daily as needed for mild constipation.   glucosamine-chondroitin 500-400 MG tablet Take 1 tablet by mouth every morning.   insulin glargine (LANTUS SOLOSTAR) 100 UNIT/ML Solostar Pen Inject 22 Units into the skin at bedtime. PATIENT SELF ADMINISTERS   insulin lispro (HUMALOG) 100 UNIT/ML KwikPen Inject 5-15 Units into the skin See admin instructions. If blood sugar is 151-200 = 5 units, 201-250 = 8 units, 251-300  = 10 units, 301-350 = 12 units, 351-400 = 14 units, 401 and above =  16.  If blood sugar is greater than 200 inject 5 units at bedtime, if greater than 500 call on  call provider   Insulin Pen Needle (PEN NEEDLES 3/16") 31G X 5 MM MISC 1 Device by Does not apply route 5 (five) times daily. With insulin administration   latanoprost (XALATAN) 0.005 % ophthalmic solution Place 1 drop into both eyes every evening.   lisinopril (ZESTRIL) 2.5 MG tablet Take 2.5 mg by mouth in the morning.   miconazole (MICOTIN) 200 MG vaginal suppository Place 200 mg vaginally at bedtime as needed (vaginal itching).   Multiple Vitamins-Minerals (I-VITE PO) Take 1 tablet by mouth in  the morning.   nitroGLYCERIN (NITROSTAT) 0.4 MG SL tablet Place 0.4 mg under the tongue every 5 (five) minutes x 3 doses as needed for chest pain.   Omega-3 Fatty Acids (FISH OIL) 1000 MG CAPS Take 1,000 mg by mouth in the morning.   omeprazole (PRILOSEC) 20 MG capsule Take 20 mg by mouth daily.   sennosides-docusate sodium (SENOKOT-S) 8.6-50 MG tablet Take 1 tablet by mouth every other day. IN THE MORNING   timolol (TIMOPTIC) 0.5 % ophthalmic solution Place 1 drop into the left eye 2 (two) times daily.   No facility-administered encounter medications on file as of 04/21/2022.    Review of Systems:  Review of Systems  Constitutional:  Positive for fatigue. Negative for activity change, appetite change, chills, diaphoresis, fever and unexpected weight change.  HENT:  Negative for congestion.   Respiratory:  Negative for cough, shortness of breath and wheezing.   Cardiovascular:  Negative for chest pain, palpitations and leg swelling.  Gastrointestinal:  Negative for abdominal distention, abdominal pain, constipation and diarrhea.  Genitourinary:  Negative for difficulty urinating and dysuria.  Musculoskeletal:  Negative for arthralgias, back pain, gait problem, joint swelling and myalgias.  Neurological:  Positive for numbness. Negative for dizziness, tremors, seizures, syncope, facial asymmetry, speech difficulty, weakness, light-headedness and headaches.  Psychiatric/Behavioral:  Negative for  agitation, behavioral problems and confusion.     Health Maintenance  Topic Date Due   FOOT EXAM  Never done   OPHTHALMOLOGY EXAM  Never done   Hepatitis C Screening  Never done   COLONOSCOPY (Pts 45-68yrs Insurance coverage will need to be confirmed)  Never done   MAMMOGRAM  Never done   Zoster Vaccines- Shingrix (1 of 2) Never done   Pneumonia Vaccine 54+ Years old (1 - PCV) Never done   DEXA SCAN  Never done   COVID-19 Vaccine (3 - Pfizer series) 02/20/2020   INFLUENZA VACCINE  04/22/2022   HEMOGLOBIN A1C  07/27/2022   TETANUS/TDAP  07/11/2024   HPV VACCINES  Aged Out    Physical Exam: Vitals:   04/21/22 1515  BP: 104/72  Pulse: 70  Temp: (!) 97 F (36.1 C)  SpO2: 99%  Weight: 130 lb (59 kg)  Height:  (1.702 m)   Body mass index is 20.36 kg/m. Physical Exam Vitals and nursing note reviewed.  Constitutional:      General: She is not in acute distress.    Appearance: She is not diaphoretic.  HENT:     Head: Normocephalic and atraumatic.     Nose: Nose normal.     Mouth/Throat:     Mouth: Mucous membranes are moist.     Pharynx: Oropharynx is clear.  Neck:     Vascular: No JVD.  Cardiovascular:     Rate and Rhythm: Normal rate and regular rhythm.     Heart sounds: No murmur heard. Pulmonary:     Effort: Pulmonary effort is normal. No respiratory distress.     Breath sounds: Normal breath sounds. No wheezing.  Abdominal:     General: Bowel sounds are normal. There is no distension.     Palpations: Abdomen is soft.  Skin:    General: Skin is warm and dry.     Coloration: Skin is pale.     Comments: Dark circle noted to the tip of the left great toe. Not tender. Not red. No drainage.  Left pedal pulse present +1  Neurological:     Mental Status: She is alert and  oriented to person, place, and time.     Labs reviewed: Basic Metabolic Panel: Recent Labs    06/10/21 0000 07/19/21 1102 10/28/21 1039 12/18/21 0000 02/06/22 0612 02/07/22 0222  02/11/22 0000 02/20/22 0000 02/20/22 0247  NA  --    < > 138   < > 137 137 134* 143  --   K  --    < > 4.2   < > 4.3 4.4 5.0 4.3  --   CL  --    < > 100   < > 101 104 96* 103  --   CO2  --    < > 32   < > 27 27 21  26*  --   GLUCOSE  --    < > 249*  --  277* 248*  --   --   --   BUN  --    < > 24   < > 24* 22 27* 23*  --   CREATININE  --    < > 0.99   < > 0.79 0.86 0.7 0.7  --   CALCIUM  --    < > 9.8   < > 9.4 8.5* 9.1  --  9.2  TSH 2.90  --   --   --   --   --   --   --   --    < > = values in this interval not displayed.   Liver Function Tests: Recent Labs    08/19/21 0000 10/28/21 1039 12/18/21 0000  AST 16 19 14   ALT 11 17 9   ALKPHOS 144*  --  139*  BILITOT  --  0.4  --   PROT  --  6.9  --   ALBUMIN 3.6  --  3.2*   No results for input(s): "LIPASE", "AMYLASE" in the last 8760 hours. No results for input(s): "AMMONIA" in the last 8760 hours. CBC: Recent Labs    09/06/21 0937 10/02/21 0725 10/28/21 1039 12/18/21 0000 02/06/22 0612 02/07/22 0222 02/11/22 0000 02/20/22 0000 03/17/22 0000  WBC 7.1  --  6.5   < > 7.7 7.6 7.7 6.3 6.4  NEUTROABS 4,395  --  3,562  --   --   --   --   --   --   HGB 10.1*   < > 10.4*   < > 10.2* 8.5* 11.2* 11.2* 10.3*  HCT 31.2*   < > 32.3*   < > 31.7* 26.6* 34* 34* 31*  MCV 91.0  --  88.7  --  88.5 87.5  --   --   --   PLT 357  --  286   < > 367 325 418*  --  288   < > = values in this interval not displayed.   Lipid Panel: No results for input(s): "CHOL", "HDL", "LDLCALC", "TRIG", "CHOLHDL", "LDLDIRECT" in the last 8760 hours. Lab Results  Component Value Date   HGBA1C 12.7 01/24/2022    Procedures since last visit: No results found.  Assessment/Plan  Fatigue Will check labs Reduce lantus as below  2. 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 Anemia of chronic disease Appears pale Will check CBC due to fatigue  4. Ischemic  cardiomyopathy No sob or edema.  Continue to monitor weights Off lasix due incontinence/frequency  4. Corn of toe No current issues Will continue to monitor area.   Labs/tests ordered:  *  No order type specified * CBC with diff, CMP A1C TSH Lipid panel in am . Next appt:  3 months with Dr Chales Abrahams    Total time :  time greater than 50% of total time spent doing pt counseling and coordination of care

## 2022-04-22 ENCOUNTER — Encounter: Payer: Self-pay | Admitting: Adult Health

## 2022-04-22 LAB — CBC AND DIFFERENTIAL
HCT: 33 — AB (ref 36–46)
Hemoglobin: 11.2 — AB (ref 12.0–16.0)
Neutrophils Absolute: 3.2
Platelets: 262 10*3/uL (ref 150–400)
WBC: 5.5

## 2022-04-22 LAB — HEPATIC FUNCTION PANEL
ALT: 15 U/L (ref 7–35)
AST: 18 (ref 13–35)
Alkaline Phosphatase: 118 (ref 25–125)
Bilirubin, Total: 0.3

## 2022-04-22 LAB — LIPID PANEL
Cholesterol: 173 (ref 0–200)
HDL: 81 — AB (ref 35–70)
LDL Cholesterol: 76
Triglycerides: 77 (ref 40–160)

## 2022-04-22 LAB — CBC: RBC: 3.75 — AB (ref 3.87–5.11)

## 2022-04-22 LAB — BASIC METABOLIC PANEL
BUN: 34 — AB (ref 4–21)
CO2: 24 — AB (ref 13–22)
Chloride: 98 — AB (ref 99–108)
Creatinine: 0.9 (ref 0.5–1.1)
Glucose: 449
Potassium: 4.7 mEq/L (ref 3.5–5.1)
Sodium: 136 — AB (ref 137–147)

## 2022-04-22 LAB — COMPREHENSIVE METABOLIC PANEL: Calcium: 9.6 (ref 8.7–10.7)

## 2022-04-22 LAB — TSH: TSH: 3.78 (ref 0.41–5.90)

## 2022-04-28 ENCOUNTER — Encounter: Payer: Self-pay | Admitting: Internal Medicine

## 2022-04-28 NOTE — Progress Notes (Signed)
Location:   [facility] Room Number: 628 Place of Service:  ALF [phone]) Provider:  Einar Crow MD  [name], MD  Patient Care Team: [name], MD as PCP - General (Internal Medicine) [name], MD as Referring Physician (Ophthalmology) Beverely Pace, [name], MD as Referring Physician (Cardiology) Bertram Gala, MD as Referring Physician (Cardiology)  Extended Emergency Contact Information Primary Emergency Contact: [name] Address: [address]          [place], [place] [address] [name] of [place] Home Phone: [phone] Mobile Phone: [phone] Relation: Son Preferred language: English Interpreter needed? No Secondary Emergency Contact: [name],[name] Mobile Phone: [phone] Relation: Relative  Code Status:  Full Code Goals of care: Advanced Directive information    04/21/2022    2:53 PM  Advanced Directives  Does Patient Have a Medical Advance Directive? Yes  Type of Advance Directive Healthcare Power of Attorney  Does patient want to make changes to medical advance directive? No - Patient declined  Copy of Healthcare Power of Attorney in Chart? Yes - validated most recent copy scanned in chart (See row information)     Chief Complaint  Patient presents with   Acute Visit    HPI:  Pt is a 71 y.o. [place] seen today for an acute visit for    Past Medical History:  Diagnosis Date   Abnormal thyroid blood test    Per Records recevied from [facility], Dr.Pearson   Acute deep vein thrombosis of right peroneal vein (HCC) 10/05/2020   RLE DVT   Anxiety    Arthritis    RA   Coronary artery disease    Diabetes type I [facility])    Per [facility] New Patient Packet   Diabetic retinopathy (HCC)    Per [facility] New Patient Packet   DKA (diabetic ketoacidosis) (HCC) 2021   Dr.William Zackowski.  Insulin Pump was out of Insulin   Glaucoma    Per [facility] New Patient Packet   H/O heart bypass surgery 2012   Dr.  Excell Seltzer, Per [facility] New Patient Packet   Heart murmur    when I was a teenager   History of blood transfusion    History of kidney stones    passed   Hx of CABG    Per records received form [facility], Dr.Pearson    Left bundle branch block    Mild mitral regurgitation    Per records from [facility], Dr.Pearson    Neuropathy 1976   Per [facility] New Patient Packet   NSTEMI (non-ST elevated myocardial infarction) (HCC) 09/21/2020   NSTEMI/demand ischemic in setting of DKA, patent grafts by 09/24/20 [facility], continue medical therapy   Osteoporosis    T Score -2.7 , Per [facility] New Patient Packet   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 due to underlying condition [facility])    Per Records recevied from [facility], Dr.Pearson   PVD (peripheral vascular disease) (HCC) 10/27/2021   Type 1 diabetes mellitus with hyperglycemia (HCC) 10/28/2021   Past Surgical History:  Procedure Laterality Date   ABDOMINAL AORTOGRAM W/LOWER EXTREMITY N/A 10/02/2021   Procedure: ABDOMINAL AORTOGRAM W/LOWER EXTREMITY;  Surgeon: Victorino Sparrow, MD;  Location: [facility] INVASIVE CV LAB;  Service: Cardiovascular;  Laterality: N/A;   AMPUTATION Right 04/17/2021   Procedure: RIGHT GREAT TOE AMPUTATION;  Surgeon: [name], MD;  Location: [facility] OR;  Service: Orthopedics;  Laterality: Right;   AMPUTATION Right 07/19/2021   Procedure: RIGHT FOOT  1ST RAY AMPUTATION;  Surgeon: [name], MD;  Location: [facility] OR;  Service: Orthopedics;  Laterality: Right;   AMPUTATION Right 02/06/2022   Procedure: AMPUTATION BELOW KNEE;  Surgeon: Toni Arthurs, MD;  Location: [facility] OR;  Service: Orthopedics;  Laterality: Right;  regional block   CESAREAN SECTION  1970   Dr. Aquilla Hacker, Per [facility] New Patient Packet   CESAREAN SECTION  1973   Dr. Aquilla Hacker, Per [facility] New Patient Packet   CORONARY ARTERY BYPASS GRAFT      Allergies  Allergen Reactions   Penicillins Anaphylaxis    Allergies as of 04/28/2022       Reactions    Penicillins Anaphylaxis        Medication List        Accurate as of April 28, 2022 11:12 AM. If you have any questions, ask your nurse or doctor.          acetaminophen 500 MG tablet Commonly known as: TYLENOL Take 1,000 mg by mouth 2 (two) times daily as needed for moderate pain.   atorvastatin 40 MG tablet Commonly known as: LIPITOR Take 40 mg by mouth at bedtime.   brimonidine 0.2 % ophthalmic solution Commonly known as: ALPHAGAN Place 1 drop into the left eye 3 (three) times daily.   calcium carbonate 1500 (600 Ca) MG Tabs tablet Commonly known as: OSCAL Take 600 mg of elemental calcium by mouth daily.   clopidogrel 75 MG tablet Commonly known as: PLAVIX Take 75 mg by mouth in the morning.   docusate sodium 100 MG capsule Commonly known as: COLACE Take 100 mg by mouth daily as needed for mild constipation.   Fish Oil 1000 MG Caps Take 1,000 mg by mouth in the morning.   FreeStyle Libre 14 Day Sensor Misc Inject 1 sensor to the skin every 14 days for continuous glucose monitoring.   glucosamine-chondroitin 500-400 MG tablet Take 1 tablet by mouth every morning.   HAIR/SKIN/NAILS PO Take 1 tablet by mouth in the morning.   I-VITE PO Take 1 tablet by mouth in the morning.   insulin lispro 100 UNIT/ML KwikPen Commonly known as: HUMALOG Inject 5-15 Units into the skin See admin instructions. If blood sugar is 151-200 = 5 units, 201-250 = 8 units, 251-300  = 10 units, 301-350 = 12 units, 351-400 = 14 units, 401 and above =  16.  If blood sugar is greater than 200 inject 5 units at bedtime, if greater than 500 call on call provider   Lantus SoloStar 100 UNIT/ML Solostar Pen Generic drug: insulin glargine Inject 18 Units into the skin at bedtime. PATIENT SELF ADMINISTERS   latanoprost 0.005 % ophthalmic solution Commonly known as: XALATAN Place 1 drop into both eyes every evening.   lisinopril 2.5 MG tablet Commonly known as: ZESTRIL Take 2.5 mg by  mouth in the morning.   miconazole 200 MG vaginal suppository Commonly known as: MICOTIN Place 200 mg vaginally at bedtime as needed (vaginal itching).   nitroGLYCERIN 0.4 MG SL tablet Commonly known as: NITROSTAT Place 0.4 mg under the tongue every 5 (five) minutes x 3 doses as needed for chest pain.   omeprazole 20 MG capsule Commonly known as: PRILOSEC Take 20 mg by mouth daily.   Pen Needles 3/16" 31G X 5 MM Misc 1 Device by Does not apply route 5 (five) times daily. With insulin administration   sennosides-docusate sodium 8.6-50 MG tablet Commonly known as: SENOKOT-S Take 1 tablet by mouth every other day.  IN THE MORNING   timolol 0.5 % ophthalmic solution Commonly known as: TIMOPTIC Place 1 drop into the left eye 2 (two) times daily.        Review of Systems  Immunization History  Administered Date(s) Administered   Influenza, High Dose Seasonal PF 09/01/2005   PFIZER(Purple Top)SARS-COV-2 Vaccination 12/02/2019, 12/26/2019   Tdap 07/11/2014   Pertinent  Health Maintenance Due  Topic Date Due   FOOT EXAM  Never done   OPHTHALMOLOGY EXAM  Never done   COLONOSCOPY (Pts 45-85yrs Insurance coverage will need to be confirmed)  Never done   MAMMOGRAM  Never done   DEXA SCAN  Never done   INFLUENZA VACCINE  04/22/2022   HEMOGLOBIN A1C  07/27/2022      02/06/2022    6:28 AM 02/06/2022    3:00 PM 02/06/2022    8:10 PM 02/07/2022   11:00 AM 04/21/2022    2:53 PM  Fall Risk  Falls in the past year?     0  Was there an injury with Fall?     0  Fall Risk Category Calculator     0  Fall Risk Category     Low  Patient Fall Risk Level Moderate fall risk Moderate fall risk High fall risk High fall risk Low fall risk  Patient at Risk for Falls Due to     No Fall Risks  Fall risk Follow up     Falls evaluation completed   Functional Status Survey:    Vitals:   04/28/22 1104  BP: (!) 178/75  Pulse: 85  Resp: 15  Temp: (!) 97.2 F (36.2 C)  SpO2: 100%  Weight:  125 lb 12.8 oz (57.1 kg)  Height: 5\' 7"  (1.702 m)   Body mass index is 19.7 kg/m. Physical Exam  Labs reviewed: Recent Labs    10/28/21 1039 12/18/21 0000 02/06/22 0612 02/07/22 0222 02/11/22 0000 02/20/22 0000 02/20/22 0247 04/22/22 0000  NA 138   < > 137 137 134* 143  --  136*  K 4.2   < > 4.3 4.4 5.0 4.3  --  4.7  CL 100   < > 101 104 96* 103  --  98*  CO2 32   < > 27 27 21  Caitlin*  --  24*  GLUCOSE 249*  --  277* 248*  --   --   --   --   BUN 24   < > 24* 22 27* 23*  --  34*  CREATININE 0.99   < > 0.79 0.86 0.7 0.7  --  0.9  CALCIUM 9.8   < > 9.4 8.5* 9.1  --  9.2 9.6   < > = values in this interval not displayed.   Recent Labs    08/19/21 0000 10/28/21 1039 12/18/21 0000 04/22/22 0000  AST 16 19 14 18   ALT 11 17 9 15   ALKPHOS 144*  --  139* 118  BILITOT  --  0.4  --   --   PROT  --  6.9  --   --   ALBUMIN 3.6  --  3.2*  --    Recent Labs    09/06/21 0937 10/02/21 0725 10/28/21 1039 12/18/21 0000 02/06/22 0612 02/07/22 0222 02/11/22 0000 02/20/22 0000 06/Caitlin/23 0000 04/22/22 0000  WBC 7.1  --  6.5   < > 7.7 7.6 7.7 6.3 6.4 5.5  NEUTROABS 4,395  --  3,562  --   --   --   --   --   --  3.20  HGB 10.1*   < > 10.4*   < > 10.2* 8.5* 11.2* 11.2* 10.3* 11.2*  HCT 31.2*   < > 32.3*   < > 31.7* Caitlin.6* 34* 34* 31* 33*  MCV 91.0  --  88.7  --  88.5 87.5  --   --   --   --   PLT 357  --  286   < > 367 325 418*  --  288 262   < > = values in this interval not displayed.   Lab Results  Component Value Date   TSH 3.78 04/22/2022   Lab Results  Component Value Date   HGBA1C 12.7 01/24/2022   Lab Results  Component Value Date   CHOL 173 04/22/2022   HDL 81 (A) 04/22/2022   LDLCALC 76 04/22/2022   TRIG 77 04/22/2022    Significant Diagnostic Results in last 30 days:  No results found.  Assessment/Plan There are no diagnoses linked to this encounter.   Family/ staff Communication:   Labs/tests ordered:

## 2022-04-29 ENCOUNTER — Non-Acute Institutional Stay: Payer: Medicare Other | Admitting: Orthopedic Surgery

## 2022-04-29 ENCOUNTER — Encounter: Payer: Self-pay | Admitting: Orthopedic Surgery

## 2022-04-29 DIAGNOSIS — Z89511 Acquired absence of right leg below knee: Secondary | ICD-10-CM | POA: Diagnosis not present

## 2022-04-29 DIAGNOSIS — L03115 Cellulitis of right lower limb: Secondary | ICD-10-CM | POA: Diagnosis not present

## 2022-04-29 DIAGNOSIS — E1021 Type 1 diabetes mellitus with diabetic nephropathy: Secondary | ICD-10-CM

## 2022-04-29 NOTE — Progress Notes (Signed)
Location:   Register Room Number: [place] of Service:  ALF [phone]) Provider:  Windell Moulding, NP  [name], MD  Patient Care Team: [name], MD as PCP - General (Internal Medicine) San Morelle, MD as Referring Physician (Ophthalmology) Atilano Median, Gayleen Orem, MD as Referring Physician (Cardiology) Alyssa Grove, MD as Referring Physician (Cardiology)  Extended Emergency Contact Information Primary Emergency Contact: [name] Address: [address]          [place], [place] [address] [name] of [place] Phone: [phone] Mobile Phone: [phone] Relation: Son Preferred language: [place] Interpreter needed? No Secondary Emergency Contact: [name],[name] Mobile Phone: [phone] Relation: Relative  Code Status:  FULL CODE Goals of care: Advanced Directive information    04/29/2022    9:53 AM  Advanced Directives  Does Patient Have a Medical Advance Directive? Yes  Type of Advance Directive [place]  Does patient want to make changes to medical advance directive? No - Patient declined  Copy of [place] in Chart? Yes - validated most recent copy scanned in chart (See row information)     Chief Complaint  Patient presents with   Acute Visit    Right thigh infection    HPI:  Pt is a 71 y.o. female seen today for an acute visit for right thigh infection.   07/27 she was found on the bathroom floor by staff. She was noted to have 2 large skin tears to right upper thigh. Nursing staff have been applying steri strips and polymem dressing prn. 08/07 nursing reports increased tenderness, purulent drainage and odor to skin tears. In addition, her blood sugars have been elevated. At times, they are registering > 500. She remains on SSI and Lantus. No recent hypoglycemias. Using [employer] to check sugars. Nursing staff administers insulin. Scheduled to see endocrinology  08/10. 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 Denies phantom pain. 07/11 she transferred to AL from rehab. Continues to work with PT/OT. Afebrile, vitals stable.    Past Medical History:  Diagnosis Date   Abnormal thyroid blood test    Per Records recevied from [facility], Dr.Pearson   Acute deep vein thrombosis of right peroneal vein ([place]) 10/05/2020   RLE DVT   Anxiety    Arthritis    RA   Coronary artery disease    Diabetes type I [facility])    Per [facility] New Patient Packet   Diabetic retinopathy ([place])    Per [facility] New Patient Packet   DKA (diabetic ketoacidosis) ([place]) 2021   Dr.William Zackowski.  Insulin Pump was out of Insulin   Glaucoma    Per [place] Patient Packet   H/O heart bypass surgery 2012   Dr. Luana Shu, Per [facility] New Patient Packet   Heart murmur    when I was a teenager   History of blood transfusion    History of kidney stones    passed   Hx of CABG    Per records received form [facility], Dr.Pearson    Left bundle branch block    Mild mitral regurgitation    Per records from [facility], [place]    Neuropathy 1976   Per [place] Patient Packet   NSTEMI (non-ST elevated myocardial infarction) ([place]) 09/21/2020   NSTEMI/demand ischemic in setting of DKA, patent grafts by 09/24/20 [facility], continue medical therapy   Osteoporosis    T Score -2.7 , Per [facility] New Patient Packet   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 due to underlying condition [facility])    Per Records recevied from [facility], Dr.Pearson   PVD (peripheral vascular disease) (H[facility]) 10/27/2021   Type 1 diabetes mellitus with hyperglycemia (HCC) 10/28/2021   Past Surgical History:  Procedure Laterality Date   ABDOMINAL AORTOGRAM W/LOWER EXTREMITY N/A 10/02/2021   Procedure: ABDOMINAL AORTOGRAM W/LOWER EXTREMITY;  Surgeon: Victorino Sparrow, MD;  Location: [facility] INVASIVE CV LAB;  Service: Cardiovascular;  Laterality: N/A;   AMPUTATION Right 04/17/2021   Procedure: RIGHT  GREAT TOE AMPUTATION;  Surgeon: [name], MD;  Location: [facility] OR;  Service: Orthopedics;  Laterality: Right;   AMPUTATION Right 07/19/2021   Procedure: RIGHT FOOT 1ST RAY AMPUTATION;  Surgeon: [name], MD;  Location: [facility] OR;  Service: Orthopedics;  Laterality: Right;   AMPUTATION Right 02/06/2022   Procedure: AMPUTATION BELOW KNEE;  Surgeon: Toni Arthurs, MD;  Location: [facility] OR;  Service: Orthopedics;  Laterality: Right;  regional block   CESAREAN SECTION  1970   Dr. Aquilla Hacker, Per [facility] New Patient Packet   CESAREAN SECTION  1973   Dr. Aquilla Hacker, Per [facility] New Patient Packet   CORONARY ARTERY BYPASS GRAFT      Allergies  Allergen Reactions   Penicillins Anaphylaxis    Allergies as of 04/29/2022       Reactions   Penicillins Anaphylaxis        Medication List        Accurate as of April 29, 2022  9:55 AM. If you have any questions, ask your nurse or doctor.          acetaminophen 500 MG tablet Commonly known as: TYLENOL Take 1,000 mg by mouth 2 (two) times daily as needed for moderate pain.   atorvastatin 40 MG tablet Commonly known as: LIPITOR Take 40 mg by mouth at bedtime.   brimonidine 0.2 % ophthalmic solution Commonly known as: ALPHAGAN Place 1 drop into the left eye 3 (three) times daily.   calcium carbonate 1500 (600 Ca) MG Tabs tablet Commonly known as: OSCAL Take 600 mg of elemental calcium by mouth daily.   clopidogrel 75 MG tablet Commonly known as: PLAVIX Take 75 mg by mouth in the morning.   docusate sodium 100 MG capsule Commonly known as: COLACE Take 100 mg by mouth daily as needed for mild constipation.   Fish Oil 1000 MG Caps Take 1,000 mg by mouth in the morning.   FreeStyle Libre 14 Day Sensor Misc Inject 1 sensor to the skin every 14 days for continuous glucose monitoring.   glucosamine-chondroitin 500-400 MG tablet Take 1 tablet by mouth every morning.   HAIR/SKIN/NAILS PO Take 1 tablet by mouth in the morning.   I-VITE  PO Take 1 tablet by mouth in the morning.   insulin lispro 100 UNIT/ML KwikPen Commonly known as: HUMALOG Inject 5-15 Units into the skin See admin instructions. If blood sugar is 151-200 = 5 units, 201-250 = 8 units, 251-300  = 10 units, 301-350 = 12 units, 351-400 = 14 units, 401 and above =  16.  If blood sugar is greater than 200 inject 5 units at bedtime, if greater than 500 call on call provider   Lantus SoloStar 100 UNIT/ML Solostar Pen Generic drug: insulin glargine Inject 20 Units into the skin at bedtime. PATIENT SELF ADMINISTERS   latanoprost 0.005 % ophthalmic solution Commonly known as: XALATAN Place 1 drop into both eyes every evening.   lisinopril 2.5 MG  tablet Commonly known as: ZESTRIL Take 2.5 mg by mouth in the morning.   miconazole 200 MG vaginal suppository Commonly known as: MICOTIN Place 200 mg vaginally at bedtime as needed (vaginal itching).   nitroGLYCERIN 0.4 MG SL tablet Commonly known as: NITROSTAT Place 0.4 mg under the tongue every 5 (five) minutes x 3 doses as needed for chest pain.   omeprazole 20 MG capsule Commonly known as: PRILOSEC Take 20 mg by mouth daily.   Pen Needles 3/16" 31G X 5 MM Misc 1 Device by Does not apply route 5 (five) times daily. With insulin administration   sennosides-docusate sodium 8.6-50 MG tablet Commonly known as: SENOKOT-S Take 1 tablet by mouth every other day. IN THE MORNING   timolol 0.5 % ophthalmic solution Commonly known as: TIMOPTIC Place 1 drop into the left eye 2 (two) times daily.        Review of Systems  Constitutional:  Negative for activity change, appetite change, chills, fatigue and fever.  HENT:  Negative for trouble swallowing.   Eyes:  Negative for visual disturbance.  Respiratory:  Negative for cough, shortness of breath and wheezing.   Cardiovascular:  Negative for chest pain and leg swelling.  Gastrointestinal:  Negative for abdominal distention, abdominal pain, constipation,  diarrhea, nausea and vomiting.  Endocrine: Negative for polydipsia, polyphagia and polyuria.  Genitourinary:  Negative for dysuria, frequency and hematuria.  Musculoskeletal:  Positive for arthralgias and gait problem. Negative for myalgias.  Skin:  Positive for wound.  Neurological:  Positive for weakness. Negative for dizziness and headaches.  Psychiatric/Behavioral:  Negative for dysphoric mood and sleep disturbance. The patient is not nervous/anxious.     Immunization History  Administered Date(s) Administered   Influenza, High Dose Seasonal PF 09/01/2005   PFIZER(Purple Top)SARS-COV-2 Vaccination 12/02/2019, 12/26/2019   Tdap 07/11/2014   Pertinent  Health Maintenance Due  Topic Date Due   FOOT EXAM  Never done   OPHTHALMOLOGY EXAM  Never done   COLONOSCOPY (Pts 45-32yrs Insurance coverage will need to be confirmed)  Never done   MAMMOGRAM  Never done   DEXA SCAN  Never done   INFLUENZA VACCINE  04/22/2022   HEMOGLOBIN A1C  07/27/2022      02/06/2022    6:28 AM 02/06/2022    3:00 PM 02/06/2022    8:10 PM 02/07/2022   11:00 AM 04/21/2022    2:53 PM  Fall Risk  Falls in the past year?     0  Was there an injury with Fall?     0  Fall Risk Category Calculator     0  Fall Risk Category     Low  Patient Fall Risk Level Moderate fall risk Moderate fall risk High fall risk High fall risk Low fall risk  Patient at Risk for Falls Due to     No Fall Risks  Fall risk Follow up     Falls evaluation completed   Functional Status Survey:    Vitals:   04/29/22 0940  BP: (!) 146/67  Pulse: 85  Resp: 15  Temp: (!) 97.2 F (36.2 C)  SpO2: 100%  Weight: 128 lb 9.6 oz (58.3 kg)  Height: 5\' 7"  (1.702 m)   Body mass index is 20.14 kg/m. Physical Exam Vitals reviewed.  Constitutional:      General: She is not in acute distress. HENT:     Head: Normocephalic.  Eyes:     General:        Right eye: No  discharge.        Left eye: No discharge.  Cardiovascular:     Rate and  Rhythm: Normal rate and regular rhythm.     Pulses: Normal pulses.     Heart sounds: Normal heart sounds.  Pulmonary:     Effort: Pulmonary effort is normal. No respiratory distress.     Breath sounds: Normal breath sounds. No wheezing.  Abdominal:     General: Bowel sounds are normal. There is no distension.     Palpations: Abdomen is soft.     Tenderness: There is no abdominal tenderness.  Musculoskeletal:     Cervical back: Neck supple.     Right lower leg: No edema.     Left lower leg: No edema.  Skin:    General: Skin is warm and dry.     Capillary Refill: Capillary refill takes less than 2 seconds.     Comments: Right upper thigh with 2 skin tears approx 1-3 cm, purulent drainage present, surrounding skin tender with mild erythema, slight odor noted, skin cool to touch.   Neurological:     General: No focal deficit present.     Mental Status: She is alert and oriented to person, place, and time.     Motor: Weakness present.     Gait: Gait abnormal.  Psychiatric:        Mood and Affect: Mood normal.        Behavior: Behavior normal.     Labs reviewed: Recent Labs    10/28/21 1039 12/18/21 0000 02/06/22 0612 02/07/22 0222 02/11/22 0000 02/20/22 0000 02/20/22 0247 04/22/22 0000  NA 138   < > 137 137 134* 143  --  136*  K 4.2   < > 4.3 4.4 5.0 4.3  --  4.7  CL 100   < > 101 104 96* 103  --  98*  CO2 32   < > 27 27 21  26*  --  24*  GLUCOSE 249*  --  277* 248*  --   --   --   --   BUN 24   < > 24* 22 27* 23*  --  34*  CREATININE 0.99   < > 0.79 0.86 0.7 0.7  --  0.9  CALCIUM 9.8   < > 9.4 8.5* 9.1  --  9.2 9.6   < > = values in this interval not displayed.   Recent Labs    08/19/21 0000 10/28/21 1039 12/18/21 0000 04/22/22 0000  AST 16 19 14 18   ALT 11 17 9 15   ALKPHOS 144*  --  139* 118  BILITOT  --  0.4  --   --   PROT  --  6.9  --   --   ALBUMIN 3.6  --  3.2*  --    Recent Labs    09/06/21 0937 10/02/21 0725 10/28/21 1039 12/18/21 0000  02/06/22 0612 02/07/22 0222 02/11/22 0000 02/20/22 0000 03/17/22 0000 04/22/22 0000  WBC 7.1  --  6.5   < > 7.7 7.6 7.7 6.3 6.4 5.5  NEUTROABS 4,395  --  3,562  --   --   --   --   --   --  3.20  HGB 10.1*   < > 10.4*   < > 10.2* 8.5* 11.2* 11.2* 10.3* 11.2*  HCT 31.2*   < > 32.3*   < > 31.7* 26.6* 34* 34* 31* 33*  MCV 91.0  --  88.7  --  88.5  87.5  --   --   --   --   PLT 357  --  286   < > 367 325 418*  --  288 262   < > = values in this interval not displayed.   Lab Results  Component Value Date   TSH 3.78 04/22/2022   Lab Results  Component Value Date   HGBA1C 12.7 01/24/2022   Lab Results  Component Value Date   CHOL 173 04/22/2022   HDL 81 (A) 04/22/2022   LDLCALC 76 04/22/2022   TRIG 77 04/22/2022    Significant Diagnostic Results in last 30 days:  No results found.  Assessment/Plan 1. Cellulitis of right thigh - 07/27 developed skin tears due to fall - 08/07 increased tenderness, purulent drainage and odor noted - high risk for infection due to uncontrolled diabetes - start doxycycline 100 mg po BID x 10 days - start mupirocin 2% ointment- apply to right thigh BID x 10 days  2. 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

## 2022-05-08 ENCOUNTER — Encounter: Payer: Self-pay | Admitting: Adult Health

## 2022-05-08 ENCOUNTER — Non-Acute Institutional Stay: Payer: Medicare Other | Admitting: Adult Health

## 2022-05-08 DIAGNOSIS — E1059 Type 1 diabetes mellitus with other circulatory complications: Secondary | ICD-10-CM

## 2022-05-08 DIAGNOSIS — T148XXA Other injury of unspecified body region, initial encounter: Secondary | ICD-10-CM | POA: Diagnosis not present

## 2022-05-08 DIAGNOSIS — L03115 Cellulitis of right lower limb: Secondary | ICD-10-CM | POA: Diagnosis not present

## 2022-05-08 DIAGNOSIS — L089 Local infection of the skin and subcutaneous tissue, unspecified: Secondary | ICD-10-CM

## 2022-05-08 NOTE — Progress Notes (Signed)
Location:  [facility] Room Number: [phone] Place of Service:  ALF [phone]) Provider:  Fletcher Anon, NP  [name], MD  Patient Care Team: [name], MD as PCP - General (Internal Medicine) [name], MD as Referring Physician (Ophthalmology) Beverely Pace, [name], MD as Referring Physician (Cardiology) Bertram Gala, MD as Referring Physician (Cardiology)  Extended Emergency Contact Information Primary Emergency Contact: [name] Address: [address]          [place], [place] [address] [name] of [place] Home Phone: [phone] Mobile Phone: [phone] Relation: Son Preferred language: English Interpreter needed? No Secondary Emergency Contact: [name],[name] Mobile Phone: [phone] Relation: Relative  Code Status:  Full Goals of care: Advanced Directive information    04/29/2022    9:53 AM  Advanced Directives  Does Patient Have a Medical Advance Directive? Yes  Type of Advance Directive Healthcare Power of Attorney  Does patient want to make changes to medical advance directive? No - Patient declined  Copy of Healthcare Power of Attorney in Chart? Yes - validated most recent copy scanned in chart (See row information)     Chief Complaint  Patient presents with   Acute Visit    Skin Tear    HPI:  Pt is a 71 y.o. female seen today for an acute visit for skin tear.  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 Worked with therapy and then transferred to assisted living. She has been a Type 1 DM for over 50 years. Used to be on a insulin pump now on lantus and meal coverage.  Recently saw endocrinology and they lowered her lantus as she voiced concern for hypoglycemia. A1C 8.6 on 04/22/22 which is improved  She keeps getting skin tears from hitting objects such as her WC She is awaiting a prosthesis from [employer].  Was treated for thigh cellulitis due to an infected skin tear with Doxycycline on  04/29/22.  She reports the area is much improved. Now has a skin tear to the LLE that has some mild yellow drainage and redness per nursing staff that is concerning. No pain or fever per pt. Past Medical History:  Diagnosis Date   Abnormal thyroid blood test    Per Records recevied from [facility], Dr.Pearson   Acute deep vein thrombosis of right peroneal vein (HCC) 10/05/2020   RLE DVT   Anxiety    Arthritis    RA   Coronary artery disease    Diabetes type I [facility])    Per [facility] New Patient Packet   Diabetic retinopathy (HCC)    Per [facility] New Patient Packet   DKA (diabetic ketoacidosis) (HCC) 2021   Dr.William Zackowski.  Insulin Pump was out of Insulin   Glaucoma    Per [facility] New Patient Packet   H/O heart bypass surgery 2012   Dr. Excell Seltzer, Per [facility] New Patient Packet   Heart murmur    when I was a teenager   History of blood transfusion    History of kidney stones    passed   Hx of CABG    Per records received form [facility], Dr.Pearson    Left bundle branch block    Mild mitral regurgitation    Per records from [facility], Dr.Pearson    Neuropathy 1976   Per [facility] New Patient Packet   NSTEMI (non-ST elevated myocardial infarction) (HCC) 09/21/2020   NSTEMI/demand ischemic in setting of DKA, patent grafts by 09/24/20 [facility], continue medical  therapy   Osteoporosis    T Score -2.7 , Per [facility] New Patient Packet   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 due to underlying condition [facility])    Per Records recevied from [facility], Dr.Pearson   PVD (peripheral vascular disease) ([place]) 10/27/2021   Type 1 diabetes mellitus with hyperglycemia ([place]) 10/28/2021   Past Surgical History:  Procedure Laterality Date   ABDOMINAL AORTOGRAM W/LOWER EXTREMITY N/A 10/02/2021   Procedure: ABDOMINAL AORTOGRAM W/LOWER EXTREMITY;  Surgeon: Broadus John, MD;  Location: [place] CV [facility];  Service: Cardiovascular;  Laterality: N/A;   AMPUTATION Right 04/17/2021    Procedure: RIGHT GREAT TOE AMPUTATION;  Surgeon: Newt Minion, MD;  Location: [place];  Service: Orthopedics;  Laterality: Right;   AMPUTATION Right 07/19/2021   Procedure: RIGHT FOOT 1ST RAY AMPUTATION;  Surgeon: Newt Minion, MD;  Location: [place];  Service: Orthopedics;  Laterality: Right;   AMPUTATION Right 02/06/2022   Procedure: AMPUTATION BELOW KNEE;  Surgeon: Wylene Simmer, MD;  Location: [place];  Service: Orthopedics;  Laterality: Right;  regional block   CESAREAN SECTION  1970   Dr. Moses Manners, Per [facility] New Patient Packet   CESAREAN SECTION  1973   Dr. Moses Manners, Per [facility] New Patient Packet   CORONARY ARTERY BYPASS GRAFT      Allergies  Allergen Reactions   Penicillins Anaphylaxis    Outpatient Encounter Medications as of 05/08/2022  Medication Sig   acetaminophen (TYLENOL) 500 MG tablet Take 1,000 mg by mouth 2 (two) times daily as needed for moderate pain.   atorvastatin (LIPITOR) 40 MG tablet Take 40 mg by mouth at bedtime.   Biotin w/ Vitamins C & E (HAIR/SKIN/NAILS PO) Take 1 tablet by mouth in the morning.   brimonidine (ALPHAGAN) 0.2 % ophthalmic solution Place 1 drop into the left eye 3 (three) times daily.   calcium carbonate (OSCAL) 1500 (600 Ca) MG TABS tablet Take 600 mg of elemental calcium by mouth daily.   clopidogrel (PLAVIX) 75 MG tablet Take 75 mg by mouth in the morning.   Continuous Blood Gluc Sensor (FREESTYLE LIBRE 14 DAY SENSOR) MISC Inject 1 sensor to the skin every 14 days for continuous glucose monitoring.   docusate sodium (COLACE) 100 MG capsule Take 100 mg by mouth daily as needed for mild constipation.   glucosamine-chondroitin 500-400 MG tablet Take 1 tablet by mouth every morning.   insulin glargine (LANTUS SOLOSTAR) 100 UNIT/ML Solostar Pen Inject 18 Units into the skin daily. In the morning. PATIENT SELF ADMINISTERS   insulin lispro (HUMALOG) 100 UNIT/ML injection Inject 5 Units into the skin at bedtime.   insulin lispro (HUMALOG) 100 UNIT/ML  KwikPen Inject 5-15 Units into the skin See admin instructions. If blood sugar is 151-200 = 5 units, 201-250 = 8 units, 251-300  = 10 units, 301-350 = 12 units, 351-400 = 14 units, 401 and above =  16.  If blood sugar is greater than 200 inject 5 units at bedtime, if greater than 500 call on call provider   Insulin Pen Needle (PEN NEEDLES 3/16") 31G X 5 MM MISC 1 Device by Does not apply route 5 (five) times daily. With insulin administration   latanoprost (XALATAN) 0.005 % ophthalmic solution Place 1 drop into both eyes every evening.   lisinopril (ZESTRIL) 2.5 MG tablet Take 2.5 mg by mouth in the morning.   miconazole (MICOTIN) 200 MG vaginal suppository Place 200 mg vaginally at bedtime as needed (  vaginal itching).   Multiple Vitamins-Minerals (I-VITE PO) Take 1 tablet by mouth in the morning.   mupirocin ointment (BACTROBAN) 2 % 1 Application 2 (two) times daily.   nitroGLYCERIN (NITROSTAT) 0.4 MG SL tablet Place 0.4 mg under the tongue every 5 (five) minutes x 3 doses as needed for chest pain.   Omega-3 Fatty Acids (FISH OIL) 1000 MG CAPS Take 1,000 mg by mouth in the morning.   omeprazole (PRILOSEC) 20 MG capsule Take 20 mg by mouth daily.   sennosides-docusate sodium (SENOKOT-S) 8.6-50 MG tablet Take 1 tablet by mouth every other day. IN THE MORNING   timolol (TIMOPTIC) 0.5 % ophthalmic solution Place 1 drop into the left eye 2 (two) times daily.   No facility-administered encounter medications on file as of 05/08/2022.    Review of Systems  Constitutional:  Positive for activity change. Negative for appetite change, chills, diaphoresis, fatigue, fever and unexpected weight change.  HENT:  Negative for congestion.   Respiratory:  Negative for cough, shortness of breath and wheezing.   Cardiovascular:  Positive for leg swelling. Negative for chest pain and palpitations.  Gastrointestinal:  Negative for abdominal distention, abdominal pain, constipation and diarrhea.  Genitourinary:   Negative for difficulty urinating and dysuria.  Musculoskeletal:  Positive for gait problem. Negative for arthralgias, back pain, joint swelling and myalgias.  Skin:  Positive for color change and wound.  Neurological:  Negative for dizziness, tremors, seizures, syncope, facial asymmetry, speech difficulty, weakness, light-headedness, numbness and headaches.  Psychiatric/Behavioral:  Negative for agitation, behavioral problems and confusion.     Immunization History  Administered Date(s) Administered   Influenza, High Dose Seasonal PF 09/01/2005   PFIZER(Purple Top)SARS-COV-2 Vaccination 12/02/2019, 12/26/2019   Tdap 07/11/2014   Pertinent  Health Maintenance Due  Topic Date Due   FOOT EXAM  Never done   OPHTHALMOLOGY EXAM  Never done   COLONOSCOPY (Pts 45-90yrs Insurance coverage will need to be confirmed)  Never done   MAMMOGRAM  Never done   DEXA SCAN  Never done   INFLUENZA VACCINE  04/22/2022   HEMOGLOBIN A1C  07/27/2022      02/06/2022    6:28 AM 02/06/2022    3:00 PM 02/06/2022    8:10 PM 02/07/2022   11:00 AM 04/21/2022    2:53 PM  Fall Risk  Falls in the past year?     0  Was there an injury with Fall?     0  Fall Risk Category Calculator     0  Fall Risk Category     Low  Patient Fall Risk Level Moderate fall risk Moderate fall risk High fall risk High fall risk Low fall risk  Patient at Risk for Falls Due to     No Fall Risks  Fall risk Follow up     Falls evaluation completed   Functional Status Survey:    Vitals:   05/08/22 0944  BP: (!) 166/78  Pulse: 60  Resp: 18  Temp: 98.1 F (36.7 C)  TempSrc: Skin  SpO2: 96%  Weight: 124 lb 9.6 oz (56.5 kg)  Height: 5\' 7"  (1.702 m)   Body mass index is 19.52 kg/m. Physical Exam Vitals and nursing note reviewed.  Constitutional:      General: She is not in acute distress.    Appearance: She is not diaphoretic.  HENT:     Head: Normocephalic and atraumatic.  Neck:     Vascular: No JVD.  Cardiovascular:      Rate  and Rhythm: Normal rate and regular rhythm.     Heart sounds: No murmur heard. Pulmonary:     Effort: Pulmonary effort is normal. No respiratory distress.     Breath sounds: Normal breath sounds. No wheezing.  Musculoskeletal:     Left lower leg: Edema (+1) present.  Skin:    General: Skin is warm and dry.     Findings: Erythema present.     Comments: Right thing skin tear healing, no redness, drainage or swelling. Left anterior shin skin tear 100% pink tissue, small amt of yellow drainage. Small amt of erythema surrounding the skin tear. No warmth or spreading redness.   Neurological:     Mental Status: She is alert and oriented to person, place, and time.     Labs reviewed: Recent Labs    10/28/21 1039 12/18/21 0000 02/06/22 0612 02/07/22 0222 02/11/22 0000 02/20/22 0000 02/20/22 0247 04/22/22 0000  NA 138   < > 137 137 134* 143  --  136*  K 4.2   < > 4.3 4.4 5.0 4.3  --  4.7  CL 100   < > 101 104 96* 103  --  98*  CO2 32   < > 27 27 21  26*  --  24*  GLUCOSE 249*  --  277* 248*  --   --   --   --   BUN 24   < > 24* 22 27* 23*  --  34*  CREATININE 0.99   < > 0.79 0.86 0.7 0.7  --  0.9  CALCIUM 9.8   < > 9.4 8.5* 9.1  --  9.2 9.6   < > = values in this interval not displayed.   Recent Labs    08/19/21 0000 10/28/21 1039 12/18/21 0000 04/22/22 0000  AST 16 19 14 18   ALT 11 17 9 15   ALKPHOS 144*  --  139* 118  BILITOT  --  0.4  --   --   PROT  --  6.9  --   --   ALBUMIN 3.6  --  3.2*  --    Recent Labs    09/06/21 0937 10/02/21 0725 10/28/21 1039 12/18/21 0000 02/06/22 0612 02/07/22 0222 02/11/22 0000 02/20/22 0000 03/17/22 0000 04/22/22 0000  WBC 7.1  --  6.5   < > 7.7 7.6 7.7 6.3 6.4 5.5  NEUTROABS 4,395  --  3,562  --   --   --   --   --   --  3.20  HGB 10.1*   < > 10.4*   < > 10.2* 8.5* 11.2* 11.2* 10.3* 11.2*  HCT 31.2*   < > 32.3*   < > 31.7* 26.6* 34* 34* 31* 33*  MCV 91.0  --  88.7  --  88.5 87.5  --   --   --   --   PLT 357  --  286   <  > 367 325 418*  --  288 262   < > = values in this interval not displayed.   Lab Results  Component Value Date   TSH 3.78 04/22/2022   Lab Results  Component Value Date   HGBA1C 12.7 01/24/2022   Lab Results  Component Value Date   CHOL 173 04/22/2022   HDL 81 (A) 04/22/2022   LDLCALC 76 04/22/2022   TRIG 77 04/22/2022    Significant Diagnostic Results in last 30 days:  No results found.  Assessment/Plan  1. Cellulitis of right thigh  Improved  2. Infected skin tear Mild surrounding erythema and drainage to left lower ext skin tear Will extend doxycycline to total 14 days   3. Type 1 diabetes mellitus with other circulatory complication (HCC) [id number] trending down On lantus and meal coverage Followed by endocrinology    Family/ staff Communication: resident   Labs/tests ordered:     Total time 60min  time greater than 50% of total time spent doing pt counseling and coordination of care

## 2022-05-09 ENCOUNTER — Encounter: Payer: Self-pay | Admitting: Adult Health

## 2022-05-09 MED ORDER — DOXYCYCLINE HYCLATE 100 MG PO TABS
100.0000 mg | ORAL_TABLET | Freq: Two times a day (BID) | ORAL | 0 refills | Status: AC
Start: 2022-05-09 — End: 2022-05-13

## 2022-07-09 ENCOUNTER — Other Ambulatory Visit: Payer: Self-pay

## 2022-07-09 ENCOUNTER — Encounter: Payer: Self-pay | Admitting: Rehabilitation

## 2022-07-09 ENCOUNTER — Ambulatory Visit: Payer: Medicare Other | Attending: Student | Admitting: Rehabilitation

## 2022-07-09 DIAGNOSIS — R2689 Other abnormalities of gait and mobility: Secondary | ICD-10-CM | POA: Diagnosis present

## 2022-07-09 DIAGNOSIS — R2681 Unsteadiness on feet: Secondary | ICD-10-CM | POA: Insufficient documentation

## 2022-07-09 DIAGNOSIS — R293 Abnormal posture: Secondary | ICD-10-CM | POA: Diagnosis present

## 2022-07-09 DIAGNOSIS — M6281 Muscle weakness (generalized): Secondary | ICD-10-CM | POA: Diagnosis present

## 2022-07-09 NOTE — Addendum Note (Signed)
Addended by: Cameron Sprang A on: 07/09/2022 02:54 PM   Modules accepted: Orders

## 2022-07-09 NOTE — Therapy (Addendum)
OUTPATIENT PHYSICAL THERAPY PROSTHETICS EVALUATION   Patient Name: Caitlin Sharp MRN: [id number] DOB:1950/10/05, 71 y.o., female Today's Date: 07/09/2022  PCP: Veleta Miners, MD REFERRING PROVIDER: Mechele Claude, PA-C   PT End of Session - 07/09/22 1411     Visit Number 1    Number of Visits 17    Date for PT Re-Evaluation 09/07/22    Authorization Type [employer] Medicare (10th visit PN needed)    PT Start Time 0900    PT Stop Time 1000    PT Time Calculation (min) 60 min    Equipment Utilized During Treatment Gait belt    Activity Tolerance Patient tolerated treatment well             Past Medical History:  Diagnosis Date   Abnormal thyroid blood test    Per Records recevied from [facility], Dr.Pearson   Acute deep vein thrombosis of right peroneal vein ([place]) 10/05/2020   RLE DVT   Anxiety    Arthritis    RA   Coronary artery disease    Diabetes type I ([place])    Per [facility] New Patient Packet   Diabetic retinopathy ([place])    Per [place] New Patient Packet   DKA (diabetic ketoacidosis) ([place]) 2021   Dr.William Zackowski.  Insulin Pump was out of Insulin   Glaucoma    Per [place] Patient Packet   H/O heart bypass surgery 2012   Dr. Luana Shu, Per [facility] New Patient Packet   Heart murmur    when I was a teenager   History of blood transfusion    History of kidney stones    passed   Hx of CABG    Per records received form [facility], Dr.Pearson    Left bundle branch block    Mild mitral regurgitation    Per records from [facility], [place]    Neuropathy 1976   Per [place] Patient Packet   NSTEMI (non-ST elevated myocardial infarction) ([place]) 09/21/2020   NSTEMI/demand ischemic in setting of DKA, patent grafts by 09/24/20 [facility], continue medical therapy   Osteoporosis    T Score -2.7 , Per [facility] New Patient Packet   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) 10/27/2021   Type 1 diabetes mellitus with hyperglycemia ([place]) 10/28/2021   Past Surgical History:  Procedure Laterality Date   ABDOMINAL AORTOGRAM W/LOWER EXTREMITY N/A 10/02/2021   Procedure: ABDOMINAL AORTOGRAM W/LOWER EXTREMITY;  Surgeon: Broadus John, MD;  Location: [place] [facility];  Service: Cardiovascular;  Laterality: N/A;   AMPUTATION Right 04/17/2021   Procedure: RIGHT GREAT TOE AMPUTATION;  Surgeon: Newt Minion, MD;  Location: [place];  Service: Orthopedics;  Laterality: Right;   AMPUTATION Right 07/19/2021   Procedure: RIGHT FOOT 1ST RAY AMPUTATION;  Surgeon: Newt Minion, MD;  Location: [place];  Service: Orthopedics;  Laterality: Right;   AMPUTATION Right 02/06/2022   Procedure: AMPUTATION BELOW KNEE;  Surgeon: Wylene Simmer, MD;  Location: [place];  Service: Orthopedics;  Laterality: Right;  regional block   CESAREAN SECTION  1970   Dr. Moses Manners, Per [facility] New Patient Packet   CESAREAN SECTION  1973   Dr. Moses Manners, Per [facility] New Patient Packet   CORONARY ARTERY BYPASS GRAFT     Patient Active Problem List   Diagnosis Date Noted   Osteomyelitis (  [place]) 02/06/2022   Osteomyelitis of right foot ([place]) 02/06/2022   Type 1 diabetes mellitus with hyperglycemia ([place]) 10/28/2021   PVD (peripheral vascular disease) ([place]) 10/27/2021   Osteomyelitis of great toe of right foot ([facility])    NSVT (nonsustained ventricular tachycardia) ([place]) 01/17/2021   Acute deep vein thrombosis (DVT) of right peroneal vein ([place]) 10/17/2020   Insulin long-term use ([place]) 07/18/2020   Bruit of left carotid artery 10/31/2019   Coronary artery disease involving native coronary artery of native heart without angina pectoris 10/31/2019   Open-angle glaucoma of right eye 10/31/2019   Dry eyes 03/01/2018   Presence of intraocular lens 10/18/2015   Cystoid macular edema 06/14/2012    ONSET DATE: 02/06/22 (amputation date)  REFERRING DIAG: Z89.511 (ICD-10-CM)  - Acquired absence of right leg below knee Z47.89 (ICD-10-CM) - Encounter for prosthetic gait training   THERAPY DIAG:  Unsteadiness on feet  Other abnormalities of gait and mobility  Abnormal posture  Muscle weakness (generalized)  Rationale for Evaluation and Treatment Rehabilitation  SUBJECTIVE:   SUBJECTIVE STATEMENT: Per pt report, "I want to do as much as possible with this leg." "I didn't expect to be here, but here I am."  Pt accompanied by: family member Ovid Curd)  PERTINENT HISTORY: 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 Worked with therapy and then transferred to assisted living. She has been a Type 1 DM for over 50 years. Used to be on a insulin pump now on lantus and meal coverage.  Recently saw endocrinology and they lowered her lantus as she voiced concern for hypoglycemia. A1C 8.6 on 04/22/22 which is improved  She keeps getting skin tears from hitting objects such as her WC She is awaiting a prosthesis from [facility].  Was treated for thigh cellulitis due to an infected skin tear with Doxycycline on 04/29/22.  She reports the area is much improved. Now has a skin tear to the LLE that has some mild yellow drainage and redness per nursing staff that is concerning.    PAIN:  Are you having pain? No   PRECAUTIONS: Fall  WEIGHT BEARING RESTRICTIONS No  FALLS: Has patient fallen in last 6 months? Yes. Number of falls 3  LIVING ENVIRONMENT: Lives with: lives with their family and lives in an assisted living facility Lives in: House/apartment Home Access: Level entry Home layout: One level Stairs: No Has following equipment at home: Environmental consultant - 2 wheeled, Wheelchair (manual), shower chair, Grab bars, and commode is elevated   PLOF: Independent  PATIENT GOALS "To be as independent as possible."   OBJECTIVE:   DIAGNOSTIC FINDINGS:   COGNITION: Overall cognitive status: Within functional limits for tasks assessed   SENSATION: WFL  MUSCLE  LENGTH: Hamstrings: Right lacking approx 5-7 deg from 0.      POSTURE: rounded shoulders, forward head, and increased thoracic kyphosis  LOWER EXTREMITY ROM:   Right eval Left eval  Hip flexion    Hip extension    Hip abduction    Hip adduction    Hip internal rotation    Hip external rotation    Knee flexion    Knee extension    Ankle dorsiflexion    Ankle plantarflexion    Ankle inversion    Ankle eversion     (Blank rows = not tested)  LOWER EXTREMITY MMT:  MMT Right eval Left eval  Hip flexion 4/5 4/5  Hip extension    Hip abduction 4/5 4/5  Hip adduction 4/5 4/5  Hip internal rotation    Hip external rotation    Knee flexion 3+/5 4/5  Knee extension 4/5 4/5  Ankle dorsiflexion    Ankle plantarflexion    Ankle inversion    Ankle eversion    (Blank rows = not tested) All tested in seated position     TRANSFERS: Sit to stand: Min A Stand to sit: Min A Stand pivot transfer: Min A    GAIT: Gait pattern: step to pattern, decreased stride length, knee flexed in stance- Right, lateral hip instability, trunk flexed, and narrow BOS Distance walked: 20' Assistive device utilized: Environmental consultant - 2 wheeled Level of assistance: Min A (son followed with w/c for safety) Gait velocity:  Comments: Pt ambulates with very narrow BOS with LLE adducted under body for improved support.  Cues for upright posture and increased L step length, along with cues for safe positioning of RW.  Had to perform several sit<>stands due to pt sinking too far inside socket, needing several adjustments of socks during session.  However due to wider knee joints/bone side, too many socks made it difficult to don socket (when 7 ply was tried).  Therefore could only use 6 ply.  Discussed that we may need to do cut off socks down at the bottom and eventually add padding inside socket when volume has regulated.     FUNCTIONAL TESTs:      CURRENT PROSTHETIC WEAR ASSESSMENT: Patient is  independent with:  dependent with all aspects of prosthetic care Patient is dependent with: skin check, residual limb care, prosthetic cleaning, ply sock cleaning, correct ply sock adjustment, proper wear schedule/adjustment, proper weight-bearing schedule/adjustment, and Provided max education regarding ply sock adjustment, bringing socks with them when out and about esp when she begins walking for volume changes.  Educated on use of vivewear sock under liner due to fragile skin.  She had a grey sock on under liner at [employer], however PT unsure if this is vivewear or not, so need to call Bobby at [facility] to ensure this is correct.  Educated that it would be great to have CNA come to session as this will be who is assisting.  She was unable to don/doff by herself today and while this is the goal, she will need help in the interim. Pt and son verbalized understanding.  We also discussed that she may have to take transportation from [place] as her son will not always be able to bring her.  Discussed beginning wear time 2hrs, 2 times per day.  She is only to try standing with assist at this time.  She has a lot of sensitivity in distal/posterior residual limb so discussed desensitization techniques during session.  Donning prosthesis: Mod A Doffing prosthesis: Max A Prosthetic wear tolerance: has not been wearing since fitted at [employer] Prosthetic weight bearing tolerance: 2 mins with heavy reliance of UEs on walker during eval.  Edema: none noted Residual limb condition: Note frail skin with small (not even 1cm) blister that is unopened.  Incision healed well, good hair growth.  Prosthetic description: Pin lock suspension K code/activity level with prosthetic use: Level 3      TODAY'S TREATMENT:   TREATMENT:  PATIENT EDUCATED ON FOLLOWING PROSTHETIC CARE: Prosthetic wear tolerance: 4 hours/day, 7 days/week Prosthetic weight bearing tolerance: 2-3 minutes Other education  Skin check, Residual limb  care, Care of non-amputated limb, Prosthetic cleaning, Ply sock cleaning, Correct ply sock adjustment, [place] donning, Proper doffing, Proper wear schedule/adjustment, and Proper weight-bearing schedule/adjustment  PATIENT EDUCATION: Education details: see above, goals, POC Person educated: Patient and Child(ren) Education method: Explanation, Demonstration, Tactile cues, and Verbal cues Education comprehension: verbalized understanding, verbal cues required, tactile cues required, and needs further education   HOME EXERCISE PROGRAM: None initiated today   ASSESSMENT:  CLINICAL IMPRESSION: Patient is a 71 y.o. female who was seen today for physical therapy evaluation and treatment for R BKA in May of this year. Note history of R foot osteomyelitis; hx of DVT, carotid bruit, cystoid macular edema, insulin dependent DM, NSVT, open-angle glaucoma, PVD.  Pt/son needed max education during session on prosthetic care/wear/etc therefore mobility assessment limited, but did perform several sit<>stands with RW at min A level and heavy UE reliance and decreased prosthetic weight bearing.  Pt will benefit from skilled OP neuro PT in order to address deficits.     OBJECTIVE IMPAIRMENTS Abnormal gait, decreased activity tolerance, decreased balance, decreased endurance, decreased knowledge of use of DME, decreased mobility, difficulty walking, decreased ROM, decreased strength, impaired perceived functional ability, impaired flexibility, postural dysfunction, and prosthetic dependency .   ACTIVITY LIMITATIONS carrying, lifting, bending, standing, squatting, stairs, transfers, bathing, toileting, dressing, reach over head, hygiene/grooming, and locomotion level  PARTICIPATION LIMITATIONS: cleaning, laundry, driving, shopping, and community activity  PERSONAL FACTORS 3+ comorbidities: see above  are also affecting patient's functional outcome.   REHAB POTENTIAL: Good  CLINICAL DECISION MAKING:  Evolving/moderate complexity  EVALUATION COMPLEXITY: Moderate   GOALS: Goals reviewed with patient? Yes  SHORT TERM GOALS: Target date: 08/06/2022    Pt/caregiver will be IND with initial HEP in order to indicate improved functional mobility and dec fall risk. Baseline: Goal status: INITIAL  2.  Pt will be able to don/doff prosthesis at min A level and tolerate 6 hours of wear time per day without skin break down. Baseline:  Goal status: INITIAL  3.  Pt will tolerate standing x 5 mins with intermittent support at S level in order to indicate improved independence with ADLs.  Baseline:  Goal status: INITIAL  4.  Will assess BERG balance when able and set LTG to reflect decreased fall risk.  Baseline:  Goal status: INITIAL  5.  Will assess gait speed and set LTG to reflect progress.  Baseline:  Goal status: INITIAL  6.  Pt will ambulate x 100' with RW and prosthesis at min A level in order to indicate improved household mobility.  Baseline:  Goal status: INITIAL  LONG TERM GOALS: Target date: 09/07/2022    Pt will be IND with final HEP in order to indicate improved functional mobility and dec fall risk. Baseline:  Goal status: INITIAL  2.  Pt will ambulate x 150' indoors and 300' outdoors over unlevel paved surfaces with RW at S level in order to indicate improved household and community mobility.  Baseline:  Goal status: INITIAL  3.  Pt will negotiate up/down curb/ramp with RW and 4 steps with rails at S level in order to indicate improved community access.   Baseline:  Goal status: INITIAL  4.  Pt will tolerate standing x 10 mins with intermittent UE support while performing ADLs in order to indicate improved household independence.  Baseline:  Goal status: INITIAL  5.  Will add BERG goal upon assessment.  Baseline:  Goal status: INITIAL  6.  Will add gait speed goal upon assessment  Baseline:  Goal status: INITIAL  7.  Pt will don/doff prosthesis at S  level and tolerate wearing prosthesis >/=90% of awake time in  order to indicate more independent prosthetic care.   Baseline:  Goal status: INITIAL   PLAN: PT FREQUENCY: 2x/week  PT DURATION: 8 weeks  PLANNED INTERVENTIONS: Therapeutic exercises, Therapeutic activity, Neuromuscular re-education, Balance training, Gait training, Patient/Family education, Self Care, Joint mobilization, Stair training, Vestibular training, Prosthetic training, DME instructions, and Manual therapy  PLAN FOR NEXT SESSION: Perform sink HEP and give to pt/caregiver.  If CNA available in session, have them assist with donning/doffing for carryover at ALF.  Continue prosthetic education.  Prosthetic gait.  I think we are going to have to do half socks at bottom because she is sinking too far in socket, but because of her bone structure its difficult to get over tib/fib.     Cameron Sprang, PT, MPT [facility] [address] [place], [place], [address] Phone: [phone]   Fax:  [phone] 07/09/22, 2:44 PM

## 2022-07-14 ENCOUNTER — Non-Acute Institutional Stay: Payer: Medicare Other | Admitting: Internal Medicine

## 2022-07-14 ENCOUNTER — Encounter: Payer: Self-pay | Admitting: Internal Medicine

## 2022-07-14 DIAGNOSIS — R4189 Other symptoms and signs involving cognitive functions and awareness: Secondary | ICD-10-CM

## 2022-07-14 DIAGNOSIS — R400 Somnolence: Secondary | ICD-10-CM | POA: Diagnosis not present

## 2022-07-14 DIAGNOSIS — Z89511 Acquired absence of right leg below knee: Secondary | ICD-10-CM

## 2022-07-14 DIAGNOSIS — E1021 Type 1 diabetes mellitus with diabetic nephropathy: Secondary | ICD-10-CM

## 2022-07-14 DIAGNOSIS — F331 Major depressive disorder, recurrent, moderate: Secondary | ICD-10-CM

## 2022-07-14 NOTE — Progress Notes (Addendum)
Location:  Occupational psychologist of Service:  ALF (13)  Provider:   Code Status: Full Code Goals of Care:     07/09/2022    9:06 AM  Advanced Directives  Does Patient Have a Medical Advance Directive? Yes  Type of Paramedic of [place];Living will  Copy of [place] in Chart? Yes - validated most recent copy scanned in chart (See row information)     Chief Complaint  Patient presents with   Acute Visit    HPI: Patient is a 71 y.o. female seen today for Acute visit   Lives in [place] in [place]   Patient H/o Type 1 Diabetes For past 50 years. Has been on Pump but now Long Acting Insulin and Novolog now CAD S/o CABG in 2012 osteoporosis 2021 -2.7 Glaucoma with Poor Vision Mild Cognitive impairment     Underwent BKA by Dr Doran Durand on 02/07/2343 For Chronic Osteomyelitis Patient was noticed to have cognitive impairment which is worsening.  Per patient she feels that she is always foggy and is unable to make new memories.  Has not noticed any focal deficit. She is also complaining of excessive daytime sleepiness.  Per patient she would go and visit her friend and they would noticed that she is more sleepy.  She sometimes goes to sleep while the nurses are talking to her. She states she has been sleeping well at night also Patient does have mild depression She states that she feels like she is losing control on her own health.  The nurses sometimes do not understand her issues well. Wt Readings from Last 3 Encounters:  07/14/22 126 lb (57.2 kg)  05/08/22 124 lb 9.6 oz (56.5 kg)  04/29/22 128 lb 9.6 oz (58.3 kg)     Past Medical History:  Diagnosis Date   Abnormal thyroid blood test    Per Records recevied from [facility], Dr.Pearson   Acute deep vein thrombosis of right peroneal vein ([place]) 10/05/2020   RLE DVT   Anxiety    Arthritis    RA   Coronary artery disease    Diabetes type I [facility])    Per [facility] New  Patient Packet   Diabetic retinopathy ([place])    Per [facility] New Patient Packet   DKA (diabetic ketoacidosis) ([place]) 2021   Dr.William Zackowski.  Insulin Pump was out of Insulin   Glaucoma    Per [place] Patient Packet   H/O heart bypass surgery 2012   Dr. Luana Shu, Per [facility] New Patient Packet   Heart murmur    when I was a teenager   History of blood transfusion    History of kidney stones    passed   Hx of CABG    Per records received form [facility], Dr.Pearson    Left bundle branch block    Mild mitral regurgitation    Per records from [facility] [place]    Neuropathy 1976   Per [place] Patient Packet   NSTEMI (non-ST elevated myocardial infarction) ([place]) 09/21/2020   NSTEMI/demand ischemic in setting of DKA, patent grafts by 09/24/20 [facility], continue medical therapy   Osteoporosis    T Score -2.7 , Per [place] New Patient Packet   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 due to underlying condition [facility])    Per Records recevied from [facility], Dr.Pearson   PVD (peripheral vascular disease) ([place]) 10/27/2021   Type 1 diabetes mellitus with hyperglycemia (  [place]) 10/28/2021    Past Surgical History:  Procedure Laterality Date   ABDOMINAL AORTOGRAM W/LOWER EXTREMITY N/A 10/02/2021   Procedure: ABDOMINAL AORTOGRAM W/LOWER EXTREMITY;  Surgeon: Broadus John, MD;  Location: [place] CV LAB;  Service: Cardiovascular;  Laterality: N/A;   AMPUTATION Right 04/17/2021   Procedure: RIGHT GREAT TOE AMPUTATION;  Surgeon: Newt Minion, MD;  Location: [place];  Service: Orthopedics;  Laterality: Right;   AMPUTATION Right 07/19/2021   Procedure: RIGHT FOOT 1ST RAY AMPUTATION;  Surgeon: Newt Minion, MD;  Location: [place];  Service: Orthopedics;  Laterality: Right;   AMPUTATION Right 02/06/2022   Procedure: AMPUTATION BELOW KNEE;  Surgeon: Wylene Simmer, MD;  Location: [place];  Service: Orthopedics;  Laterality: Right;  regional block   CESAREAN SECTION  1970    Dr. Moses Manners, Per [facility] New Patient Packet   CESAREAN SECTION  1973   Dr. Moses Manners, Per [facility] New Patient Packet   CORONARY ARTERY BYPASS GRAFT      Allergies  Allergen Reactions   Penicillins Anaphylaxis    Outpatient Encounter Medications as of 07/14/2022  Medication Sig   acetaminophen (TYLENOL) 500 MG tablet Take 1,000 mg by mouth 2 (two) times daily as needed for moderate pain.   atorvastatin (LIPITOR) 40 MG tablet Take 40 mg by mouth at bedtime.   Biotin w/ Vitamins C & E (HAIR/SKIN/NAILS PO) Take 1 tablet by mouth in the morning.   brimonidine (ALPHAGAN) 0.2 % ophthalmic solution Place 1 drop into the left eye 3 (three) times daily.   calcium carbonate (OSCAL) 1500 (600 Ca) MG TABS tablet Take 600 mg of elemental calcium by mouth daily.   clopidogrel (PLAVIX) 75 MG tablet Take 75 mg by mouth in the morning.   Continuous Blood Gluc Sensor (FREESTYLE LIBRE 14 DAY SENSOR) MISC Inject 1 sensor to the skin every 14 days for continuous glucose monitoring.   docusate sodium (COLACE) 100 MG capsule Take 100 mg by mouth daily as needed for mild constipation.   glucosamine-chondroitin 500-400 MG tablet Take 1 tablet by mouth every morning.   insulin glargine (LANTUS SOLOSTAR) 100 UNIT/ML Solostar Pen Inject 18 Units into the skin daily. In the morning. PATIENT SELF ADMINISTERS   insulin lispro (HUMALOG) 100 UNIT/ML injection Inject 5 Units into the skin at bedtime.   insulin lispro (HUMALOG) 100 UNIT/ML KwikPen Inject 5-15 Units into the skin See admin instructions. If blood sugar is 151-200 = 5 units, 201-250 = 8 units, 251-300  = 10 units, 301-350 = 12 units, 351-400 = 14 units, 401 and above =  16.  If blood sugar is greater than 200 inject 5 units at bedtime, if greater than 500 call on call provider   Insulin Pen Needle (PEN NEEDLES 3/16") 31G X 5 MM MISC 1 Device by Does not apply route 5 (five) times daily. With insulin administration   latanoprost (XALATAN) 0.005 % ophthalmic solution  Place 1 drop into both eyes every evening.   lisinopril (ZESTRIL) 2.5 MG tablet Take 2.5 mg by mouth in the morning.   miconazole (MICOTIN) 200 MG vaginal suppository Place 200 mg vaginally at bedtime as needed (vaginal itching).   Multiple Vitamins-Minerals (I-VITE PO) Take 1 tablet by mouth in the morning.   nitroGLYCERIN (NITROSTAT) 0.4 MG SL tablet Place 0.4 mg under the tongue every 5 (five) minutes x 3 doses as needed for chest pain.   Omega-3 Fatty Acids (FISH OIL) 1000 MG CAPS Take 1,000 mg by mouth in  the morning.   omeprazole (PRILOSEC) 20 MG capsule Take 20 mg by mouth daily.   sennosides-docusate sodium (SENOKOT-S) 8.6-50 MG tablet Take 1 tablet by mouth every other day. IN THE MORNING   timolol (TIMOPTIC) 0.5 % ophthalmic solution Place 1 drop into the left eye 2 (two) times daily.   [DISCONTINUED] mupirocin ointment (BACTROBAN) 2 % 1 Application 2 (two) times daily.   No facility-administered encounter medications on file as of 07/14/2022.    Review of Systems:  Review of Systems  Constitutional:  Negative for activity change and appetite change.  HENT: Negative.    Respiratory:  Negative for cough and shortness of breath.   Cardiovascular:  Negative for leg swelling.  Gastrointestinal:  Negative for constipation.  Genitourinary: Negative.   Musculoskeletal:  Positive for gait problem. Negative for arthralgias and myalgias.  Skin: Negative.   Neurological:  Negative for dizziness and weakness.  Psychiatric/Behavioral:  Positive for confusion, dysphoric mood and sleep disturbance.     Health Maintenance  Topic Date Due   FOOT EXAM  Never done   OPHTHALMOLOGY EXAM  Never done   Diabetic kidney evaluation - Urine ACR  Never done   Hepatitis C Screening  Never done   COLONOSCOPY (Pts 45-39yrs Insurance coverage will need to be confirmed)  Never done   MAMMOGRAM  Never done   Zoster Vaccines- Shingrix (1 of 2) Never done   Pneumonia Vaccine 39+ Years old (1 - PCV) Never  done   DEXA SCAN  Never done   COVID-19 Vaccine (3 - Pfizer series) 02/20/2020   INFLUENZA VACCINE  04/22/2022   HEMOGLOBIN A1C  07/27/2022   Diabetic kidney evaluation - GFR measurement  04/23/2023   TETANUS/TDAP  07/11/2024   HPV VACCINES  Aged Out    Physical Exam: Vitals:   07/14/22 2127  BP: 130/60  Pulse: 72  Resp: 18  Temp: 97.7 F (36.5 C)  Weight: 126 lb (57.2 kg)   Body mass index is 19.73 kg/m. Physical Exam Vitals reviewed.  Constitutional:      Appearance: Normal appearance.  HENT:     Head: Normocephalic.     Nose: Nose normal.     Mouth/Throat:     Mouth: Mucous membranes are moist.     Pharynx: Oropharynx is clear.  Eyes:     Pupils: Pupils are equal, round, and reactive to light.  Cardiovascular:     Rate and Rhythm: Normal rate and regular rhythm.     Pulses: Normal pulses.     Heart sounds: Normal heart sounds. No murmur heard. Pulmonary:     Effort: Pulmonary effort is normal.     Breath sounds: Normal breath sounds.  Abdominal:     General: Abdomen is flat. Bowel sounds are normal.     Palpations: Abdomen is soft.  Musculoskeletal:        General: No swelling.     Cervical back: Neck supple.  Skin:    General: Skin is warm.  Neurological:     General: No focal deficit present.     Mental Status: She is alert and oriented to person, place, and time.     Comments: S/p right bka  Psychiatric:        Mood and Affect: Mood normal.        Thought Content: Thought content normal.     Labs reviewed: Basic Metabolic Panel: Recent Labs    10/28/21 1039 12/18/21 0000 02/06/22 0612 02/07/22 0222 02/11/22 0000 02/20/22 0000 02/20/22 0247  04/22/22 0000  NA 138   < > 137 137 134* 143  --  136*  K 4.2   < > 4.3 4.4 5.0 4.3  --  4.7  CL 100   < > 101 104 96* 103  --  98*  CO2 32   < > 27 27 21  26*  --  24*  GLUCOSE 249*  --  277* 248*  --   --   --   --   BUN 24   < > 24* 22 27* 23*  --  34*  CREATININE 0.99   < > 0.79 0.86 0.7 0.7  --   0.9  CALCIUM 9.8   < > 9.4 8.5* 9.1  --  9.2 9.6  TSH  --   --   --   --   --   --   --  3.78   < > = values in this interval not displayed.   Liver Function Tests: Recent Labs    08/19/21 0000 10/28/21 1039 12/18/21 0000 04/22/22 0000  AST 16 19 14 18   ALT 11 17 9 15   ALKPHOS 144*  --  139* 118  BILITOT  --  0.4  --   --   PROT  --  6.9  --   --   ALBUMIN 3.6  --  3.2*  --    No results for input(s): "LIPASE", "AMYLASE" in the last 8760 hours. No results for input(s): "AMMONIA" in the last 8760 hours. CBC: Recent Labs    09/06/21 0937 10/02/21 0725 10/28/21 1039 12/18/21 0000 02/06/22 0612 02/07/22 0222 02/11/22 0000 02/20/22 0000 03/17/22 0000 04/22/22 0000  WBC 7.1  --  6.5   < > 7.7 7.6 7.7 6.3 6.4 5.5  NEUTROABS 4,395  --  3,562  --   --   --   --   --   --  3.20  HGB 10.1*   < > 10.4*   < > 10.2* 8.5* 11.2* 11.2* 10.3* 11.2*  HCT 31.2*   < > 32.3*   < > 31.7* 26.6* 34* 34* 31* 33*  MCV 91.0  --  88.7  --  88.5 87.5  --   --   --   --   PLT 357  --  286   < > 367 325 418*  --  288 262   < > = values in this interval not displayed.   Lipid Panel: Recent Labs    04/22/22 0000  CHOL 173  HDL 81*  LDLCALC 76  TRIG 77   Lab Results  Component Value Date   HGBA1C 12.7 01/24/2022    Procedures since last visit: No results found.  Assessment/Plan 1. Cognitive impairment Check MMSE CBC,CMP Refer to neurology  2. Daytime sleepiness Refer to neurology ? Sleep apnea no issues before  3. 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 S/P BKA (below knee amputation) unilateral, right ([place]) Is working with Ortho to get a prosthesis.  Sometimes patient refuses to wear her prosthesis because it hurts.  5. Moderate episode of recurrent major depressive disorder ([place]) Will not start her on any antidepressants yet. Discussed with the son and he does not want her to see  a neurologist first  Other issues H/O NSTEMI (non-ST elevated myocardial infarction) ([place]) On Plavix and statin Age-related osteoporosis without current pathological fracture Will Need repeat DEXA in 2023.  On calcium  and Vit D Ischemic cardiomyopathy EF 50 % 9/22 On Lisinopril and Lasix Could not tolerate beta blockers due to low BP and dizziness diabetic nephropathy (HCC) Had Proteinuria per patient On Low dose of ACE inibitor Primary open angle glaucoma (POAG) of both eyes, moderate stage Poor Vision Follows with Opthalmologist H/o Superficial DVT in setting of Acute illness Was treated with Elquis for 3 months HLD On statin Labs/tests ordered:  * No order type specified * Next appt:  07/29/2022

## 2022-07-14 NOTE — Addendum Note (Signed)
Addended by: Georgina Snell on: 07/14/2022 09:37 PM   Modules accepted: Orders

## 2022-07-15 ENCOUNTER — Ambulatory Visit: Payer: Medicare Other | Admitting: Physical Therapy

## 2022-07-15 ENCOUNTER — Encounter: Payer: Self-pay | Admitting: Physical Therapy

## 2022-07-15 DIAGNOSIS — M6281 Muscle weakness (generalized): Secondary | ICD-10-CM

## 2022-07-15 DIAGNOSIS — R2681 Unsteadiness on feet: Secondary | ICD-10-CM | POA: Diagnosis not present

## 2022-07-15 DIAGNOSIS — R2689 Other abnormalities of gait and mobility: Secondary | ICD-10-CM

## 2022-07-15 DIAGNOSIS — R293 Abnormal posture: Secondary | ICD-10-CM

## 2022-07-15 NOTE — Therapy (Unsigned)
OUTPATIENT PHYSICAL THERAPY TREATMENT NOTE   Patient Name: Caitlin Sharp MRN: [id number] DOB:[birth year], 71 y.o., female Today's Date: 07/15/2022  PCP: Veleta Miners, MD REFERRING PROVIDER: Mechele Claude, PA-C  END OF SESSION:   PT End of Session - 07/15/22 0938     Visit Number 2    Number of Visits 17    Date for PT Re-Evaluation 09/07/22    Authorization Type [employer] Medicare (10th visit PN needed)    PT Start Time 0936   pt late   PT Stop Time 1024    PT Time Calculation (min) 48 min    Equipment Utilized During Treatment Gait belt    Activity Tolerance Patient tolerated treatment well    Behavior During Therapy WFL for tasks assessed/performed             Past Medical History:  Diagnosis Date   Abnormal thyroid blood test    Per Records recevied from [facility], Dr.Pearson   Acute deep vein thrombosis of right peroneal vein ([place]) 10/05/2020   RLE DVT   Anxiety    Arthritis    RA   Coronary artery disease    Diabetes type I ([place])    Per [facility] New Patient Packet   Diabetic retinopathy ([place])    Per [facility] New Patient Packet   DKA (diabetic ketoacidosis) ([place]) 2021   Dr.William Zackowski.  Insulin Pump was out of Insulin   Glaucoma    Per [place] Patient Packet   H/O heart bypass surgery 2012   Dr. Luana Shu, Per [facility] New Patient Packet   Heart murmur    when I was a teenager   History of blood transfusion    History of kidney stones    passed   Hx of CABG    Per records received form [facility], Dr.Pearson    Left bundle branch block    Mild mitral regurgitation    Per records from [facility], [place]    Neuropathy 1976   Per [place] Patient Packet   NSTEMI (non-ST elevated myocardial infarction) ([place]) 09/21/2020   NSTEMI/demand ischemic in setting of DKA, patent grafts by 09/24/20 [facility], continue medical therapy   Osteoporosis    T Score -2.7 , Per [place] New Patient Packet   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 due  to underlying condition [facility])    Per Records recevied from [facility], Dr.Pearson   PVD (peripheral vascular disease) ([place]) 10/27/2021   Type 1 diabetes mellitus with hyperglycemia ([place]) 10/28/2021   Past Surgical History:  Procedure Laterality Date   ABDOMINAL AORTOGRAM W/LOWER EXTREMITY N/A 10/02/2021   Procedure: ABDOMINAL AORTOGRAM W/LOWER EXTREMITY;  Surgeon: Broadus John, MD;  Location: [place] [facility];  Service: Cardiovascular;  Laterality: N/A;   AMPUTATION Right 04/17/2021   Procedure: RIGHT GREAT TOE AMPUTATION;  Surgeon: Newt Minion, MD;  Location: [place];  Service: Orthopedics;  Laterality: Right;   AMPUTATION Right 07/19/2021   Procedure: RIGHT FOOT 1ST RAY AMPUTATION;  Surgeon: Newt Minion, MD;  Location: [place];  Service: Orthopedics;  Laterality: Right;   AMPUTATION Right 02/06/2022   Procedure: AMPUTATION BELOW KNEE;  Surgeon: Wylene Simmer, MD;  Location: [place];  Service: Orthopedics;  Laterality: Right;  regional block   CESAREAN SECTION  1970   Dr. Moses Manners, Per [facility] New Patient Packet   CESAREAN SECTION  1973   Dr. Moses Manners, Per [facility] New Patient Packet   CORONARY ARTERY BYPASS GRAFT  Patient Active Problem List   Diagnosis Date Noted   Osteomyelitis ([place]) 02/06/2022   Osteomyelitis of right foot ([place]) 02/06/2022   Type 1 diabetes mellitus with hyperglycemia ([place]) 10/28/2021   PVD (peripheral vascular disease) ([place]) 10/27/2021   Osteomyelitis of great toe of right foot ([facility])    NSVT (nonsustained ventricular tachycardia) ([place]) 01/17/2021   Acute deep vein thrombosis (DVT) of right peroneal vein ([place]) 10/17/2020   Insulin long-term use ([place]) 07/18/2020   Bruit of left carotid artery 10/31/2019   Coronary artery disease involving native coronary artery of native heart without angina pectoris 10/31/2019   Open-angle glaucoma of right eye 10/31/2019   Dry eyes 03/01/2018   Presence of intraocular lens 10/18/2015   Cystoid macular edema 06/14/2012     REFERRING DIAG: Z89.511 (ICD-10-CM) - Acquired absence of right leg below knee Z47.89 (ICD-10-CM) - Encounter for prosthetic gait training    THERAPY DIAG:  Unsteadiness on feet  Other abnormalities of gait and mobility  Abnormal posture  Muscle weakness (generalized)  Rationale for Evaluation and Treatment Rehabilitation  PERTINENT HISTORY: 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 Worked with therapy and then transferred to assisted living. She has been a Type 1 DM for over 50 years. Used to be on a insulin pump now on lantus and meal coverage.  Recently saw endocrinology and they lowered her lantus as she voiced concern for hypoglycemia. A1C 8.6 on 04/22/22 which is improved  She keeps getting skin tears from hitting objects such as her WC She is awaiting a prosthesis from [employer].  Was treated for thigh cellulitis due to an infected skin tear with Doxycycline on 04/29/22.  She reports the area is much improved. Now has a skin tear to the LLE that has some mild yellow drainage and redness per nursing staff that is concerning.   PRECAUTIONS: Fall  SUBJECTIVE:                                                                                                                                                                                      SUBJECTIVE STATEMENT:   Pt presents via [facility] transportation this visit.  "I have not done much standing on it because I was told to put it on for 2 hours twice a day."  No falls since evaluation.     PAIN:  Are you having pain? No   OBJECTIVE: (objective measures completed at initial evaluation unless otherwise dated)   CURRENT PROSTHETIC WEAR ASSESSMENT: Patient is independent with:  dependent with all aspects of prosthetic care Patient is dependent with: skin check, residual limb care, prosthetic cleaning, ply sock cleaning,  correct ply sock adjustment, proper wear schedule/adjustment, proper weight-bearing  schedule/adjustment  Re-iterated education provided prior regarding ply sock adjustment, bringing socks with them when out (pt brings orange bag with socks for adjustment this visit, unaware that she has 5 ply sock on) and about esp when she begins walking for volume changes.  Educated on use of vivewear sock under liner due to fragile skin.  She continues to have a folded stretch grey sock w/ a dark wool sock (creating 3 layers essentially) on under liner stating it was provided at [employer].   She continues to need help donning/doffing prosthetic and layered components.     Donning prosthesis: Mod A-time spent allowing pt to don layers and liner w/ mod cuing for sequence of layers and placement to prevent wrinkles and excess pressure; when redonning layers and prosthetic in ADL kitchen several times, pt required mod-maxA and max instructions for choosing appropriate sock ply as pt unsure of what ply she was wearing on presentation to clinic. Doffing prosthesis: Max A Prosthetic wear tolerance: Pt further states she has not actually been wearing the prosthetic (since fitting at [facility]) despite reporting prior edu from eval. Prosthetic weight bearing tolerance: no prolonged weight bearing this session Edema: none noted Residual limb condition: 0.5 cm healing skin tear (dry and closed, mild redness in center) on medial aspect of residual limb and small reddened spot (not a blister, not raised) just inferior to lateral aspect of incision.  Prosthetic description: Pin lock suspension K code/activity level with prosthetic use: Level 3      -STS at ADL sink x1 CGA w/ pt bottoming out in socket immediately in standing, time spent trialing 1 vs 3 play sock w/ difficulty redonning prosthetic.  Pt will likely need a half sock to be further trialed at next session as lateral and medial shape of current socket do not allow a full sock to be donned with successful completion of redonning the prosthetic.  Discussed  this option with pt and encouraged adhering to wearing tolerance previously scheduled.  Edu that is her CNA can successfully don the prosthetic with however much layering she needs to prevent bottoming out that they should practice her standing and weight bearing tolerance at the sink.       TODAY'S TREATMENT:    TREATMENT:  PATIENT EDUCATED ON FOLLOWING PROSTHETIC CARE: Prosthetic wear tolerance: 4 hours/day, 7 days/week Prosthetic weight bearing tolerance: 2-3 minutes Other education  Skin check, Residual limb care, Care of non-amputated limb, Prosthetic cleaning, Ply sock cleaning, Correct ply sock adjustment, Propper donning, Proper doffing, Proper wear schedule/adjustment, and Proper weight-bearing schedule/adjustment       PATIENT EDUCATION: Education details: Continued to encourage having a CNA come in to session to observe donning/doffing to assist pt.  Continuing to wear leg x2 hours twice a day and STS at sink in room at facility practicing sock adjustment including a half sock as needed if clicking too far into socket. Person educated: Patient and Child(ren) Education method: Explanation, Demonstration, Tactile cues, and Verbal cues Education comprehension: verbalized understanding, verbal cues required, tactile cues required, and needs further education     HOME EXERCISE PROGRAM: STS and weight bearing at sink as tolerated throughout the day   ASSESSMENT:   CLINICAL IMPRESSION: Time spent in session discussing layers to donn under prosthetic, edu on sock ply as pt unsure what ply she had with her today, and allowing pt time to practice donning layers and prothesis with assist from the therapist.  Pt performs STS x1 at sink CGA and immediately bottoms out in the socket.  PT unable to donn prosthesis successfully a second time this visit with whole socks donned as well, so will spend time next session cutting socks to better fit bone structure of residual limb and socket.  Will  continue POC.     OBJECTIVE IMPAIRMENTS Abnormal gait, decreased activity tolerance, decreased balance, decreased endurance, decreased knowledge of use of DME, decreased mobility, difficulty walking, decreased ROM, decreased strength, impaired perceived functional ability, impaired flexibility, postural dysfunction, and prosthetic dependency .    ACTIVITY LIMITATIONS carrying, lifting, bending, standing, squatting, stairs, transfers, bathing, toileting, dressing, reach over head, hygiene/grooming, and locomotion level   PARTICIPATION LIMITATIONS: cleaning, laundry, driving, shopping, and community activity   PERSONAL FACTORS 3+ comorbidities: see above  are also affecting patient's functional outcome.    REHAB POTENTIAL: Good   CLINICAL DECISION MAKING: Evolving/moderate complexity   EVALUATION COMPLEXITY: Moderate     GOALS: Goals reviewed with patient? Yes   SHORT TERM GOALS: Target date: 08/06/2022     Pt/caregiver will be IND with initial HEP in order to indicate improved functional mobility and dec fall risk. Baseline: Goal status: INITIAL   2.  Pt will be able to don/doff prosthesis at min A level and tolerate 6 hours of wear time per day without skin break down. Baseline:  Goal status: INITIAL   3.  Pt will tolerate standing x 5 mins with intermittent support at S level in order to indicate improved independence with ADLs.  Baseline:  Goal status: INITIAL   4.  Will assess BERG balance when able and set LTG to reflect decreased fall risk.  Baseline:  Goal status: INITIAL   5.  Will assess gait speed and set LTG to reflect progress.  Baseline:  Goal status: INITIAL   6.  Pt will ambulate x 100' with RW and prosthesis at min A level in order to indicate improved household mobility.  Baseline:  Goal status: INITIAL   LONG TERM GOALS: Target date: 09/07/2022     Pt will be IND with final HEP in order to indicate improved functional mobility and dec fall  risk. Baseline:  Goal status: INITIAL   2.  Pt will ambulate x 150' indoors and 300' outdoors over unlevel paved surfaces with RW at S level in order to indicate improved household and community mobility.  Baseline:  Goal status: INITIAL   3.  Pt will negotiate up/down curb/ramp with RW and 4 steps with rails at S level in order to indicate improved community access.   Baseline:  Goal status: INITIAL   4.  Pt will tolerate standing x 10 mins with intermittent UE support while performing ADLs in order to indicate improved household independence.  Baseline:  Goal status: INITIAL   5.  Will add BERG goal upon assessment.  Baseline:  Goal status: INITIAL   6.  Will add gait speed goal upon assessment  Baseline:  Goal status: INITIAL   7.  Pt will don/doff prosthesis at S level and tolerate wearing prosthesis >/=90% of awake time in order to indicate more independent prosthetic care.   Baseline:  Goal status: INITIAL     PLAN: PT FREQUENCY: 2x/week   PT DURATION: 8 weeks   PLANNED INTERVENTIONS: Therapeutic exercises, Therapeutic activity, Neuromuscular re-education, Balance training, Gait training, Patient/Family education, Self Care, Joint mobilization, Stair training, Vestibular training, Prosthetic training, DME instructions, and Manual therapy   PLAN FOR NEXT SESSION: Perform  sink HEP and give to pt/caregiver.  If CNA available in session, have them assist with donning/doffing for carryover at ALF.  Continue prosthetic education.  Prosthetic gait.  I think we are going to have to do half socks at bottom because she is sinking too far in socket, but because of her bone structure its difficult to get over tib/fib.  Call [place] at [facility] to ensure grey sock pt is wearing in vivewear or alternative.  CNAs requesting resource to learn how to don prosthetic-will plan to provide further instructions in person as well.   Bary Richard, PT, DPT 07/15/2022, 10:29 AM

## 2022-07-17 LAB — HEPATIC FUNCTION PANEL
ALT: 13 U/L (ref 7–35)
AST: 18 (ref 13–35)
Alkaline Phosphatase: 65 (ref 25–125)

## 2022-07-17 LAB — CBC AND DIFFERENTIAL
HCT: 32 — AB (ref 36–46)
Hemoglobin: 10.7 — AB (ref 12.0–16.0)
Platelets: 266 10*3/uL (ref 150–400)
WBC: 5.2

## 2022-07-17 LAB — COMPREHENSIVE METABOLIC PANEL
Albumin: 3.7 (ref 3.5–5.0)
Calcium: 9.2 (ref 8.7–10.7)
eGFR: 64

## 2022-07-17 LAB — BASIC METABOLIC PANEL
BUN: 25 — AB (ref 4–21)
CO2: 29 — AB (ref 13–22)
Chloride: 106 (ref 99–108)
Creatinine: 1 (ref 0.5–1.1)
Glucose: 133
Potassium: 4.7 mEq/L (ref 3.5–5.1)
Sodium: 143 (ref 137–147)

## 2022-07-17 LAB — CBC: RBC: 3.72 — AB (ref 3.87–5.11)

## 2022-07-18 ENCOUNTER — Encounter: Payer: Self-pay | Admitting: Physical Therapy

## 2022-07-18 ENCOUNTER — Ambulatory Visit: Payer: Medicare Other | Admitting: Physical Therapy

## 2022-07-18 DIAGNOSIS — M6281 Muscle weakness (generalized): Secondary | ICD-10-CM

## 2022-07-18 DIAGNOSIS — R2681 Unsteadiness on feet: Secondary | ICD-10-CM

## 2022-07-18 DIAGNOSIS — R293 Abnormal posture: Secondary | ICD-10-CM

## 2022-07-18 DIAGNOSIS — R2689 Other abnormalities of gait and mobility: Secondary | ICD-10-CM

## 2022-07-18 NOTE — Patient Instructions (Signed)
Please help pt don prosthesis w/ grey wool vivewear sock and liner followed by 5 ply half (cut) sock and 1 ply full sock (socks will need further adjustment as pt begins tolerating more weight-bearing).  Have pt practice standing and sitting at counter or sink and remain standing x2-5 minutes as pt can tolerate.  She needs to continue wearing the leg 2x a day for 2 hours at a time ideally once in the morning and once in the evening.  Pt is okay to continue wearing the grey shrinker when not wearing the prosthesis.  Access Code: [email or web address] URL: [email or web address] Date: 07/18/2022 Prepared by: Elease Etienne  Exercises - Standing March with Counter Support  - 2 x daily - 7 x weekly - 1 sets - 10 reps - Seated Knee Flexion Extension AROM   - 2 x daily - 7 x weekly - 1 sets - 10 reps

## 2022-07-18 NOTE — Therapy (Unsigned)
OUTPATIENT PHYSICAL THERAPY TREATMENT NOTE   Patient Name: Caitlin GuildSusan H Skirvin MRN: [id number] DOB:[birth year], 71 y.o., female Today's Date: 07/18/2022  PCP: Einar CrowAnjali Gupta, MD REFERRING PROVIDER: Alfredo MartinezJustin Ollis, PA-C  END OF SESSION:   PT End of Session - 07/18/22 0940     Visit Number 3    Number of Visits 17    Date for PT Re-Evaluation 09/07/22    Authorization Type [employer] Medicare (10th visit PN needed)    PT Start Time 0935    Equipment Utilized During Treatment Gait belt    Activity Tolerance Patient tolerated treatment well    Behavior [facility] for tasks assessed/performed             Past Medical History:  Diagnosis Date   Abnormal thyroid blood test    [facility]   Acute deep vein thrombosis of right peroneal vein (HCC) 10/05/2020   RLE DVT   Anxiety    Arthritis    RA   Coronary artery disease    Diabetes type I (HCC)    [facility]   Diabetic retinopathy ([facility])    [facility]   DKA (diabetic ketoacidosis) (HCC) 2021   Dr.William Zackowski.  Insulin Pump was out of Insulin   Glaucoma    Per [facility] New Patient Packet   H/O heart bypass surgery 2012   Dr. Excell [facility]eltzerBaker, [facility]   Heart murmur    when I was a teenager   History of blood transfusion    History of kidney stones    passed   Hx of CABG    [facility]    Left bundle branch block    Mild mitral regurgitation    [facility]    Neuropathy 1976   [facility]   NSTEMI (non-ST elevated myocardial infarction) (HCC) 09/21/2020   NSTEMI/demand ischemic in setting of DKA, patent grafts by 09/24/20 [facility], continue medical therapy   Osteoporosis    T Score -2.7 , Per [facility] New Patient Packet   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 due to underlying condition [facility](HCC)    [facility]   PVD (peripheral vascular disease) (HCC) 10/27/2021   Type 1 diabetes mellitus with hyperglycemia (HCC) 10/28/2021   Past Surgical History:  Procedure Laterality Date   ABDOMINAL AORTOGRAM W/LOWER EXTREMITY N/A 10/02/2021   Procedure: ABDOMINAL AORTOGRAM W/LOWER EXTREMITY;  Surgeon: Victorino Sparrowobins, Joshua E, MD;  [facility];  Service: Cardiovascular;  Laterality: N/A;   AMPUTATION Right 04/17/2021   Procedure: RIGHT GREAT TOE AMPUTATION;  Surgeon: Nadara Mustarduda, Marcus V, MD;  [facility];  Service: Orthopedics;  Laterality: Right;   AMPUTATION Right 07/19/2021   Procedure: RIGHT FOOT 1ST RAY AMPUTATION;  Surgeon: Nadara Mustarduda, Marcus V, MD;  [facility];  Service: Orthopedics;  Laterality: Right;   AMPUTATION Right 02/06/2022   Procedure: AMPUTATION BELOW KNEE;  Surgeon: Toni ArthursHewitt, John, MD;  Location: [facility] OR;  Service: Orthopedics;  Laterality: Right;  regional block   CESAREAN SECTION  1970   Dr. Aquilla HackerPaul Bower, [facility]   CESAREAN SECTION  1973   Dr. Aquilla HackerPaul Bower[facility]   CORONARY ARTERY BYPASS GRAFT     Patient Active Problem List   Diagnosis Date Noted   Osteomyelitis (HCC) 02/06/2022  Osteomyelitis of right foot ([place]) 02/06/2022   Type 1 diabetes mellitus with hyperglycemia ([place]) 10/28/2021   PVD (peripheral vascular disease) ([place]) 10/27/2021   Osteomyelitis of great toe of right foot (HCC)    NSVT (nonsustained ventricular tachycardia) ([place]) 01/17/2021   Acute deep vein thrombosis (DVT) of right peroneal vein ([place]) 10/17/2020   Insulin long-term use ([place]) 07/18/2020   Bruit of left carotid artery 10/31/2019   Coronary artery disease involving native coronary artery of native heart without angina pectoris 10/31/2019   Open-angle glaucoma of right eye 10/31/2019   Dry eyes 03/01/2018   Presence of intraocular lens 10/18/2015   Cystoid macular edema 06/14/2012    REFERRING DIAG: Z89.511 (ICD-10-CM) - Acquired absence of right leg  below knee Z47.89 (ICD-10-CM) - Encounter for prosthetic gait training    THERAPY DIAG:  Unsteadiness on feet  Other abnormalities of gait and mobility  Abnormal posture  Muscle weakness (generalized)  Rationale for Evaluation and Treatment Rehabilitation  PERTINENT HISTORY: 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 Worked with therapy and then transferred to assisted living. She has been a Type 1 DM for over 50 years. Used to be on a insulin pump now on lantus and meal coverage.  Recently saw endocrinology and they lowered her lantus as she voiced concern for hypoglycemia. A1C 8.6 on 04/22/22 which is improved  She keeps getting skin tears from hitting objects such as her WC She is awaiting a prosthesis from [employer].  Was treated for thigh cellulitis due to an infected skin tear with Doxycycline on 04/29/22.  She reports the area is much improved. Now has a skin tear to the LLE that has some mild yellow drainage and redness per nursing staff that is concerning.   PRECAUTIONS: Fall  SUBJECTIVE:                                                                                                                                                                                      SUBJECTIVE STATEMENT:   Pt presents via [facility] transportation this visit.  "I have not done much standing on it because I was told to put it on for 2 hours twice a day."  No falls since evaluation.     PAIN:  Are you having pain? No   OBJECTIVE: (objective measures completed at initial evaluation unless otherwise dated)   CURRENT PROSTHETIC WEAR ASSESSMENT: Patient is independent with:  dependent with all aspects of prosthetic care Patient is dependent with: skin check, residual limb care, prosthetic cleaning, ply sock cleaning, correct ply sock adjustment, proper wear schedule/adjustment, proper weight-bearing schedule/adjustment  Re-iterated education provided prior regarding  ply sock  adjustment, bringing socks with them when out (pt brings orange bag with socks for adjustment this visit, unaware that she has 5 ply sock on) and about esp when she begins walking for volume changes.  Educated on use of vivewear sock under liner due to fragile skin.  She continues to have a folded stretch grey sock w/ a dark wool sock (creating 3 layers essentially) on under liner stating it was provided at [employer].   She continues to need help donning/doffing prosthetic and layered components.     Donning prosthesis: Mod A-time spent allowing pt to don layers and liner w/ mod cuing for sequence of layers and placement to prevent wrinkles and excess pressure; when redonning layers and prosthetic in ADL kitchen several times, pt required mod-maxA and max instructions for choosing appropriate sock ply as pt unsure of what ply she was wearing on presentation to clinic. Doffing prosthesis: Max A Prosthetic wear tolerance: Pt further states she has not actually been wearing the prosthetic (since fitting at [facility]) despite reporting prior edu from eval. Prosthetic weight bearing tolerance: no prolonged weight bearing this session Edema: none noted Residual limb condition: 0.5 cm healing skin tear (dry and closed, mild redness in center) on medial aspect of residual limb and small reddened spot (not a blister, not raised) just inferior to lateral aspect of incision.  Prosthetic description: Pin lock suspension K code/activity level with prosthetic use: Level 3      -STS at ADL sink x1 CGA w/ pt bottoming out in socket immediately in standing, time spent trialing 1 vs 3 play sock w/ difficulty redonning prosthetic.  Pt will likely need a half sock to be further trialed at next session as lateral and medial shape of current socket do not allow a full sock to be donned with successful completion of redonning the prosthetic.  Discussed this option with pt and encouraged adhering to wearing tolerance previously  scheduled.  Edu that is her CNA can successfully don the prosthetic with however much layering she needs to prevent bottoming out that they should practice her standing and weight bearing tolerance at the sink.       TODAY'S TREATMENT:    TREATMENT:  PATIENT EDUCATED ON FOLLOWING PROSTHETIC CARE: Prosthetic wear tolerance: 4 hours/day, 7 days/week Prosthetic weight bearing tolerance: 2-3 minutes Other education  Skin check, Residual limb care, Care of non-amputated limb, Prosthetic cleaning, Ply sock cleaning, Correct ply sock adjustment, Propper donning, Proper doffing, Proper wear schedule/adjustment, and Proper weight-bearing schedule/adjustment       PATIENT EDUCATION: Education details: Continued to encourage having a CNA come in to session to observe donning/doffing to assist pt.  Continuing to wear leg x2 hours twice a day and STS at sink in room at facility practicing sock adjustment including a half sock as needed if clicking too far into socket. Person educated: Patient and Child(ren) Education method: Explanation, Demonstration, Tactile cues, and Verbal cues Education comprehension: verbalized understanding, verbal cues required, tactile cues required, and needs further education     HOME EXERCISE PROGRAM: PT provided printout for CNAs via son 07/18/2022:  Please help pt don prosthesis w/ grey wool vivewear sock and liner followed by 5 ply half (cut) sock and 1 ply full sock (socks will need further adjustment as pt begins tolerating more weight-bearing).  Have pt practice standing and sitting at counter or sink and remain standing x2-5 minutes as pt can tolerate.  She needs to continue wearing the leg 2x a  day for 2 hours at a time ideally once in the morning and once in the evening.  Pt is okay to continue wearing the grey shrinker when not wearing the prosthesis.   Access Code: [email or web address] URL: [email or web address] Date: 07/18/2022 Prepared by: Elease Etienne    Exercises - Standing March with Counter Support  - 2 x daily - 7 x weekly - 1 sets - 10 reps - Seated Knee Flexion Extension AROM   - 2 x daily - 7 x weekly - 1 sets - 10 reps   ASSESSMENT:   CLINICAL IMPRESSION: Time spent in session discussing layers to donn under prosthetic, edu on sock ply as pt unsure what ply she had with her today, and allowing pt time to practice donning layers and prothesis with assist from the therapist.  Pt performs STS x1 at sink CGA and immediately bottoms out in the socket.  PT unable to donn prosthesis successfully a second time this visit with whole socks donned as well, so will spend time next session cutting socks to better fit bone structure of residual limb and socket.  Will continue POC.     OBJECTIVE IMPAIRMENTS Abnormal gait, decreased activity tolerance, decreased balance, decreased endurance, decreased knowledge of use of DME, decreased mobility, difficulty walking, decreased ROM, decreased strength, impaired perceived functional ability, impaired flexibility, postural dysfunction, and prosthetic dependency .    ACTIVITY LIMITATIONS carrying, lifting, bending, standing, squatting, stairs, transfers, bathing, toileting, dressing, reach over head, hygiene/grooming, and locomotion level   PARTICIPATION LIMITATIONS: cleaning, laundry, driving, shopping, and community activity   PERSONAL FACTORS 3+ comorbidities: see above  are also affecting patient's functional outcome.    REHAB POTENTIAL: Good   CLINICAL DECISION MAKING: Evolving/moderate complexity   EVALUATION COMPLEXITY: Moderate     GOALS: Goals reviewed with patient? Yes   SHORT TERM GOALS: Target date: 08/06/2022     Pt/caregiver will be IND with initial HEP in order to indicate improved functional mobility and dec fall risk. Baseline: Goal status: INITIAL   2.  Pt will be able to don/doff prosthesis at min A level and tolerate 6 hours of wear time per day without skin break  down. Baseline:  Goal status: INITIAL   3.  Pt will tolerate standing x 5 mins with intermittent support at S level in order to indicate improved independence with ADLs.  Baseline:  Goal status: INITIAL   4.  Will assess BERG balance when able and set LTG to reflect decreased fall risk.  Baseline:  Goal status: INITIAL   5.  Will assess gait speed and set LTG to reflect progress.  Baseline:  Goal status: INITIAL   6.  Pt will ambulate x 100' with RW and prosthesis at min A level in order to indicate improved household mobility.  Baseline:  Goal status: INITIAL   LONG TERM GOALS: Target date: 09/07/2022     Pt will be IND with final HEP in order to indicate improved functional mobility and dec fall risk. Baseline:  Goal status: INITIAL   2.  Pt will ambulate x 150' indoors and 300' outdoors over unlevel paved surfaces with RW at S level in order to indicate improved household and community mobility.  Baseline:  Goal status: INITIAL   3.  Pt will negotiate up/down curb/ramp with RW and 4 steps with rails at S level in order to indicate improved community access.   Baseline:  Goal status: INITIAL   4.  Pt will tolerate standing x 10 mins with  intermittent UE support while performing ADLs in order to indicate improved household independence.  Baseline:  Goal status: INITIAL   5.  Will add BERG goal upon assessment.  Baseline:  Goal status: INITIAL   6.  Will add gait speed goal upon assessment  Baseline:  Goal status: INITIAL   7.  Pt will don/doff prosthesis at S level and tolerate wearing prosthesis >/=90% of awake time in order to indicate more independent prosthetic care.   Baseline:  Goal status: INITIAL     PLAN: PT FREQUENCY: 2x/week   PT DURATION: 8 weeks   PLANNED INTERVENTIONS: Therapeutic exercises, Therapeutic activity, Neuromuscular re-education, Balance training, Gait training, Patient/Family education, Self Care, Joint mobilization, Stair training,  Vestibular training, Prosthetic training, DME instructions, and Manual therapy   PLAN FOR NEXT SESSION: Perform sink HEP and give to pt/caregiver.  If CNA available in session, have them assist with donning/doffing for carryover at ALF.  Continue prosthetic education.  Prosthetic gait.  I think we are going to have to do half socks at bottom because she is sinking too far in socket, but because of her bone structure its difficult to get over tib/fib.   CNAs requesting resource to learn how to don prosthetic-will plan to provide further instructions in person as well.   Bary Richard, PT, DPT 07/18/2022, 9:41 AM

## 2022-07-21 ENCOUNTER — Encounter: Payer: Self-pay | Admitting: Internal Medicine

## 2022-07-22 ENCOUNTER — Encounter: Payer: Self-pay | Admitting: Rehabilitation

## 2022-07-22 ENCOUNTER — Ambulatory Visit: Payer: Medicare Other | Admitting: Rehabilitation

## 2022-07-22 DIAGNOSIS — M6281 Muscle weakness (generalized): Secondary | ICD-10-CM

## 2022-07-22 DIAGNOSIS — R2689 Other abnormalities of gait and mobility: Secondary | ICD-10-CM

## 2022-07-22 DIAGNOSIS — R2681 Unsteadiness on feet: Secondary | ICD-10-CM | POA: Diagnosis not present

## 2022-07-22 DIAGNOSIS — R293 Abnormal posture: Secondary | ICD-10-CM

## 2022-07-22 NOTE — Therapy (Signed)
OUTPATIENT PHYSICAL THERAPY TREATMENT NOTE   Patient Name: Caitlin Sharp MRN: [id number] DOB:06/21/1951, 71 y.o., female Today's Date: 07/22/2022  PCP: Einar Crow, MD REFERRING PROVIDER: Alfredo Martinez, PA-C  END OF SESSION:   PT End of Session - 07/22/22 1203     Visit Number 4    Number of Visits 17    Date for PT Re-Evaluation 09/07/22    Authorization Type [employer] Medicare (10th visit PN needed)    PT Start Time 0935    PT Stop Time 1030    PT Time Calculation (min) 55 min    Equipment Utilized During Treatment Gait belt    Activity Tolerance Patient tolerated treatment well    Behavior During Therapy WFL for tasks assessed/performed              Past Medical History:  Diagnosis Date   Abnormal thyroid blood test    Per Records recevied from [facility], Dr.Pearson   Acute deep vein thrombosis of right peroneal vein (HCC) 10/05/2020   RLE DVT   Anxiety    Arthritis    RA   Coronary artery disease    Diabetes type I (HCC)    Per [facility] New Patient Packet   Diabetic retinopathy (HCC)    Per [facility] New Patient Packet   DKA (diabetic ketoacidosis) (HCC) 2021   Dr.William Zackowski.  Insulin Pump was out of Insulin   Glaucoma    Per [facility] New Patient Packet   H/O heart bypass surgery 2012   Dr. Excell Seltzer, Per [facility] New Patient Packet   Heart murmur    when I was a teenager   History of blood transfusion    History of kidney stones    passed   Hx of CABG    Per records received form [facility], Dr.Pearson    Left bundle branch block    Mild mitral regurgitation    Per records from [facility], Dr.Pearson    Neuropathy 1976   Per [facility] New Patient Packet   NSTEMI (non-ST elevated myocardial infarction) (HCC) 09/21/2020   NSTEMI/demand ischemic in setting of DKA, patent grafts by 09/24/20 [facility], continue medical therapy   Osteoporosis    T Score -2.7 , Per [facility] New Patient Packet   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 due to  underlying condition [facility])    Per Records recevied from [facility], Dr.Pearson   PVD (peripheral vascular disease) (HCC) 10/27/2021   Type 1 diabetes mellitus with hyperglycemia (HCC) 10/28/2021   Past Surgical History:  Procedure Laterality Date   ABDOMINAL AORTOGRAM W/LOWER EXTREMITY N/A 10/02/2021   Procedure: ABDOMINAL AORTOGRAM W/LOWER EXTREMITY;  Surgeon: Victorino Sparrow, MD;  Location: [facility] INVASIVE CV LAB;  Service: Cardiovascular;  Laterality: N/A;   AMPUTATION Right 04/17/2021   Procedure: RIGHT GREAT TOE AMPUTATION;  Surgeon: [name], MD;  Location: [facility] OR;  Service: Orthopedics;  Laterality: Right;   AMPUTATION Right 07/19/2021   Procedure: RIGHT FOOT 1ST RAY AMPUTATION;  Surgeon: [name], MD;  Location: [facility] OR;  Service: Orthopedics;  Laterality: Right;   AMPUTATION Right 02/06/2022   Procedure: AMPUTATION BELOW KNEE;  Surgeon: Toni Arthurs, MD;  Location: [facility] OR;  Service: Orthopedics;  Laterality: Right;  regional block   CESAREAN SECTION  1970   Dr. Aquilla Hacker, Per [facility] New Patient Packet   CESAREAN SECTION  1973   Dr. Aquilla Hacker, Per [facility] New Patient Packet   CORONARY ARTERY BYPASS GRAFT  Patient Active Problem List   Diagnosis Date Noted   Osteomyelitis ([place]) 02/06/2022   Osteomyelitis of right foot ([place]) 02/06/2022   Type 1 diabetes mellitus with hyperglycemia ([place]) 10/28/2021   PVD (peripheral vascular disease) ([place]) 10/27/2021   Osteomyelitis of great toe of right foot (HCC)    NSVT (nonsustained ventricular tachycardia) ([place]) 01/17/2021   Acute deep vein thrombosis (DVT) of right peroneal vein ([place]) 10/17/2020   Insulin long-term use ([place]) 07/18/2020   Bruit of left carotid artery 10/31/2019   Coronary artery disease involving native coronary artery of native heart without angina pectoris 10/31/2019   Open-angle glaucoma of right eye 10/31/2019   Dry eyes 03/01/2018   Presence of intraocular lens 10/18/2015   Cystoid macular edema 06/14/2012     REFERRING DIAG: Z89.511 (ICD-10-CM) - Acquired absence of right leg below knee Z47.89 (ICD-10-CM) - Encounter for prosthetic gait training    THERAPY DIAG:  Unsteadiness on feet  Other abnormalities of gait and mobility  Abnormal posture  Muscle weakness (generalized)  Rationale for Evaluation and Treatment Rehabilitation  PERTINENT HISTORY: 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 Worked with therapy and then transferred to assisted living. She has been a Type 1 DM for over 50 years. Used to be on a insulin pump now on lantus and meal coverage.  Recently saw endocrinology and they lowered her lantus as she voiced concern for hypoglycemia. A1C 8.6 on 04/22/22 which is improved  She keeps getting skin tears from hitting objects such as her WC She is awaiting a prosthesis from [facility].  Was treated for thigh cellulitis due to an infected skin tear with Doxycycline on 04/29/22.  She reports the area is much improved. Now has a skin tear to the LLE that has some mild yellow drainage and redness per nursing staff that is concerning.   PRECAUTIONS: Fall  SUBJECTIVE:                                                                                                                                                                                      SUBJECTIVE STATEMENT:   Pt presents via [facility] transportation this visit.  "I have not done much standing on it because I was told to put it on for 2 hours twice a day."  No falls since evaluation.     PAIN:  Are you having pain? No   OBJECTIVE: (objective measures completed at initial evaluation unless otherwise dated)   CURRENT PROSTHETIC WEAR ASSESSMENT: Patient is independent with:  dependent with all aspects of prosthetic care Patient is dependent with: skin check, residual limb care, prosthetic cleaning, ply sock cleaning,  correct ply sock adjustment, proper wear schedule/adjustment, proper weight-bearing  schedule/adjustment Donning prosthesis: MaxA  Doffing prosthesis: Max A due to friction in socket Prosthetic wear tolerance: Pt has been wearing prosthesis consistently 2 hrs, 2x/day since last Friday!!  Prosthetic weight bearing tolerance: ~2-4 minutes at a time Edema: none noted Residual limb condition: vary small healing skin tear (dry and closed, pink)  Prosthetic description: Pin lock suspension K code/activity level with prosthetic use: Level 3  Initial part of session spent re-iterating instructions from session prior to pt and son to clarify.  Answered all questions as appropriate.    In // bars:  -Prosthesis donned w/ pt obtaining standing w/ SBA, bottoming out in the socket -PT attempting to cut higher half socks during session, however was unable to don over bony tibial plateaus so had to go back to using very short 5ply but was able to add an additional cut 3ply at end of session for best fit yet.  Pt also reports better comfort.   -Prior to adding 3ply pt able to ambulate with RW x 38' at min A level. Note MARKED fatigue when getting back to chair needing more assist and cues for safety turn.  During gait, provided tactile and verbal cues for RW, step, RW, step sequence to ensure walker is far enough ahead and she is able to take adequate steps.  Also worked on her increasing R knee extension with stance phase of gait and also worked on on this standing.  Utilized mirror when doing standing exercises to reinforce her having equal WB and improved  R weight shifting onto prosthesis.  Pt very determined to do weight shifting without UE despite PT having safety concerns.  She reports she is doing this at sink and also reaching into cabinet to work on balance.  I feel that she would be safe enough to do standing activities at sink with chair behind her for safety.  Highly recommend she wait until Monday (hopefully once prosthesis fits better) until walking with CNAs.  Pt and son verbalized  understanding.     TODAY'S TREATMENT:    TREATMENT:  PT did not change schedules and re-iterated: PATIENT EDUCATED ON FOLLOWING PROSTHETIC CARE: Prosthetic wear tolerance: 4 hours/day, 7 days/week Prosthetic weight bearing tolerance: 2-3 minutes Other education  Skin check, Residual limb care, Care of non-amputated limb, Prosthetic cleaning, Ply sock cleaning, Correct ply sock adjustment, Propper donning, Proper doffing, Proper wear schedule/adjustment, and Proper weight-bearing schedule/adjustment   PATIENT EDUCATION: Education details: Continued to encourage having a CNA come in to session to observe donning/doffing to assist pt.  Continuing to wear leg x2 hours twice a day and STS at sink in room at facility practicing sock adjustment including a half sock as needed if clicking too far into socket. Person educated: Patient and Child(ren) Education method: Explanation, Demonstration, Tactile cues, and Verbal cues Education comprehension: verbalized understanding, verbal cues required, tactile cues required, and needs further education  HOME EXERCISE PROGRAM: PT provided printout for CNAs via son 07/18/2022:  Please help pt don prosthesis w/ grey wool vivewear sock and liner followed by 5 ply half (cut) sock and 1 ply full sock (socks will need further adjustment as pt begins tolerating more weight-bearing).  Have pt practice standing and sitting at counter or sink and remain standing x2-5 minutes as pt can tolerate.  She needs to continue wearing the leg 2x a day for 2 hours at a time ideally once in the morning and once in the  evening.  Pt is okay to continue wearing the grey shrinker when not wearing the prosthesis.   Access Code: [email or web address] URL: [email or web address] Date: 07/18/2022 Prepared by: Elease Etienne   Exercises - Standing March with Counter Support  - 2 x daily - 7 x weekly - 1 sets - 10 reps - Seated Knee Flexion Extension AROM   - 2 x daily - 7 x weekly -  1 sets - 10 reps - Continue standing at sink w/ caregiver as able throughout the day w/ prosthesis donned.  ASSESSMENT:   CLINICAL IMPRESSION: Continue to spend extensive time in session providing education, clarifying and answering questions for patient and son.  Also recommended they video son donning leg, but son reports he has done this and its difficult due to amount of caregivers.  PT continued to trial varying half socks, however the only way I could get socket donned was to keep very small half sock (5ply) from last session.  I was able to add an additional 3 ply half sock at end of session which provided best fit. Pt to see [facility] on Friday for hopeful adjustments and better fit.     OBJECTIVE IMPAIRMENTS Abnormal gait, decreased activity tolerance, decreased balance, decreased endurance, decreased knowledge of use of DME, decreased mobility, difficulty walking, decreased ROM, decreased strength, impaired perceived functional ability, impaired flexibility, postural dysfunction, and prosthetic dependency .    ACTIVITY LIMITATIONS carrying, lifting, bending, standing, squatting, stairs, transfers, bathing, toileting, dressing, reach over head, hygiene/grooming, and locomotion level   PARTICIPATION LIMITATIONS: cleaning, laundry, driving, shopping, and community activity   PERSONAL FACTORS 3+ comorbidities: see above  are also affecting patient's functional outcome.    REHAB POTENTIAL: Good   CLINICAL DECISION MAKING: Evolving/moderate complexity   EVALUATION COMPLEXITY: Moderate     GOALS: Goals reviewed with patient? Yes   SHORT TERM GOALS: Target date: 08/06/2022     Pt/caregiver will be IND with initial HEP in order to indicate improved functional mobility and dec fall risk. Baseline: Goal status: INITIAL   2.  Pt will be able to don/doff prosthesis at min A level and tolerate 6 hours of wear time per day without skin break down. Baseline:  Goal status: INITIAL    3.  Pt will tolerate standing x 5 mins with intermittent support at S level in order to indicate improved independence with ADLs.  Baseline:  Goal status: INITIAL   4.  Will assess BERG balance when able and set LTG to reflect decreased fall risk.  Baseline:  Goal status: INITIAL   5.  Will assess gait speed and set LTG to reflect progress.  Baseline:  Goal status: INITIAL   6.  Pt will ambulate x 100' with RW and prosthesis at min A level in order to indicate improved household mobility.  Baseline:  Goal status: INITIAL   LONG TERM GOALS: Target date: 09/07/2022     Pt will be IND with final HEP in order to indicate improved functional mobility and dec fall risk. Baseline:  Goal status: INITIAL   2.  Pt will ambulate x 150' indoors and 300' outdoors over unlevel paved surfaces with RW at S level in order to indicate improved household and community mobility.  Baseline:  Goal status: INITIAL   3.  Pt will negotiate up/down curb/ramp with RW and 4 steps with rails at S level in order to indicate improved community access.   Baseline:  Goal status: INITIAL   4.  Pt will tolerate  standing x 10 mins with intermittent UE support while performing ADLs in order to indicate improved household independence.  Baseline:  Goal status: INITIAL   5.  Will add BERG goal upon assessment.  Baseline:  Goal status: INITIAL   6.  Will add gait speed goal upon assessment  Baseline:  Goal status: INITIAL   7.  Pt will don/doff prosthesis at S level and tolerate wearing prosthesis >/=90% of awake time in order to indicate more independent prosthetic care.   Baseline:  Goal status: INITIAL     PLAN: PT FREQUENCY: 2x/week   PT DURATION: 8 weeks   PLANNED INTERVENTIONS: Therapeutic exercises, Therapeutic activity, Neuromuscular re-education, Balance training, Gait training, Patient/Family education, Self Care, Joint mobilization, Stair training, Vestibular training, Prosthetic training,  DME instructions, and Manual therapy   PLAN FOR NEXT SESSION: Perform sink HEP (smartphrase) and give to pt/caregiver.  If fit is better after seeing [facility], I would like to okay her to walk with CNAs.  If CNA available in session, have them assist with donning/doffing for carryover at ALF.  Continue prosthetic education.  Prosthetic gait.  I think we are going to have to do half socks at bottom because she is sinking too far in socket, but because of her bone structure its difficult to get over tib/fib.   Cameron Sprang, PT, MPT [facility] [address] [place], [place], [address] Phone: [phone]   Fax:  [phone] 07/22/22, 12:05 PM

## 2022-07-23 ENCOUNTER — Encounter: Payer: Medicare Other | Admitting: Internal Medicine

## 2022-07-25 ENCOUNTER — Ambulatory Visit: Payer: Medicare Other | Admitting: Physical Therapy

## 2022-07-29 ENCOUNTER — Encounter: Payer: Self-pay | Admitting: Internal Medicine

## 2022-07-29 ENCOUNTER — Ambulatory Visit: Payer: Medicare Other | Attending: Student | Admitting: Physical Therapy

## 2022-07-29 ENCOUNTER — Encounter: Payer: Self-pay | Admitting: Physical Therapy

## 2022-07-29 ENCOUNTER — Non-Acute Institutional Stay: Payer: Medicare Other | Admitting: Internal Medicine

## 2022-07-29 VITALS — BP 130/66 | HR 77 | Temp 97.8°F | Resp 17 | Ht 67.0 in | Wt 124.0 lb

## 2022-07-29 DIAGNOSIS — R2689 Other abnormalities of gait and mobility: Secondary | ICD-10-CM | POA: Insufficient documentation

## 2022-07-29 DIAGNOSIS — R293 Abnormal posture: Secondary | ICD-10-CM | POA: Diagnosis present

## 2022-07-29 DIAGNOSIS — D638 Anemia in other chronic diseases classified elsewhere: Secondary | ICD-10-CM

## 2022-07-29 DIAGNOSIS — R400 Somnolence: Secondary | ICD-10-CM | POA: Diagnosis not present

## 2022-07-29 DIAGNOSIS — M6281 Muscle weakness (generalized): Secondary | ICD-10-CM | POA: Insufficient documentation

## 2022-07-29 DIAGNOSIS — Z89511 Acquired absence of right leg below knee: Secondary | ICD-10-CM

## 2022-07-29 DIAGNOSIS — F331 Major depressive disorder, recurrent, moderate: Secondary | ICD-10-CM

## 2022-07-29 DIAGNOSIS — R2681 Unsteadiness on feet: Secondary | ICD-10-CM | POA: Diagnosis present

## 2022-07-29 DIAGNOSIS — E1021 Type 1 diabetes mellitus with diabetic nephropathy: Secondary | ICD-10-CM | POA: Diagnosis not present

## 2022-07-29 DIAGNOSIS — I255 Ischemic cardiomyopathy: Secondary | ICD-10-CM

## 2022-07-29 DIAGNOSIS — R4189 Other symptoms and signs involving cognitive functions and awareness: Secondary | ICD-10-CM | POA: Diagnosis not present

## 2022-07-29 NOTE — Progress Notes (Signed)
Location:  [place] [facility] Service:  Clinic (12)  Provider:   Code Status: Full Code Goals of Care:     07/29/2022    9:16 AM  Advanced Directives  Does Patient Have a Medical Advance Directive? Yes  Type of Paramedic of [place];Living will;Out of facility DNR (pink MOST or yellow form)  Does patient want to make changes to medical advance directive? No - Patient declined  Copy of [place] in Chart? Yes - validated most recent copy scanned in chart (See row information)     Chief Complaint  Patient presents with   Medical Management of Chronic Issues    3 month follow up.     HPI: Patient is a 71 y.o. female seen today for medical management of chronic diseases.    Lives in [place] in [place]   Patient H/o Type 1 Diabetes For past 50 years. Has been on Pump but now Long Acting Insulin and Novolog now CAD S/o CABG in 2012 osteoporosis 2021 -2.7 Glaucoma with Poor Vision Mild Cognitive impairment Underwent BKA by Dr Doran Durand on 02/06/22 For Chronic Osteomyelitis   Cognitive impairment MMSE 29/30 Passed her Clock But per staff and her son continues to have issue with her memory And some behaviors Repeats herself  Daytime Sleepiness Does get up 2-3 times at night to go to bathroom Especially hard with her BKA Continues ot have issue with Fatigue and sleepiness during daytime Situational Depression She states that she feels like she is losing control on her own health.  The nurses sometimes do not understand her issues well.  Wt Readings from Last 3 Encounters:  07/29/22 124 lb (56.2 kg)  07/14/22 126 lb (57.2 kg)  05/08/22 124 lb 9.6 oz (56.5 kg)    She is now doing well with her walking with her prosthesis Needs assist with Putting it on And her ambulation    Past Medical History:  Diagnosis Date   Abnormal thyroid blood test    Per Records recevied from [facility], Dr.Pearson   Acute deep  vein thrombosis of right peroneal vein ([place]) 10/05/2020   RLE DVT   Anxiety    Arthritis    RA   Coronary artery disease    Diabetes type I [facility])    Per [facility] New Patient Packet   Diabetic retinopathy ([place])    Per [facility] New Patient Packet   DKA (diabetic ketoacidosis) ([place]) 2021   Dr.William Zackowski.  Insulin Pump was out of Insulin   Glaucoma    Per [facility] Patient Packet   H/O heart bypass surgery 2012   Dr. Luana Shu, Per [facility] New Patient Packet   Heart murmur    when I was a teenager   History of blood transfusion    History of kidney stones    passed   Hx of CABG    Per records received form [facility], Dr.Pearson    Left bundle branch block    Mild mitral regurgitation    Per records from [facility], [place]    Neuropathy 1976   Per [facility] Patient Packet   NSTEMI (non-ST elevated myocardial infarction) ([place]) 09/21/2020   NSTEMI/demand ischemic in setting of DKA, patent grafts by 09/24/20 [facility], continue medical therapy   Osteoporosis    T Score -2.7 , Per [place] New Patient Packet   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 due to underlying condition ([place])  Per Records recevied from [facility], [place]   PVD (peripheral vascular disease) ([place]) 10/27/2021   Type 1 diabetes mellitus with hyperglycemia ([place]) 10/28/2021    Past Surgical History:  Procedure Laterality Date   ABDOMINAL AORTOGRAM W/LOWER EXTREMITY N/A 10/02/2021   Procedure: ABDOMINAL AORTOGRAM W/LOWER EXTREMITY;  Surgeon: Broadus John, MD;  Location: [facility] [place] [facility];  Service: Cardiovascular;  Laterality: N/A;   AMPUTATION Right 04/17/2021   Procedure: RIGHT GREAT TOE AMPUTATION;  Surgeon: Newt Minion, MD;  Location: [place];  Service: Orthopedics;  Laterality: Right;   AMPUTATION Right 07/19/2021   Procedure: RIGHT FOOT 1ST RAY AMPUTATION;  Surgeon: Newt Minion, MD;  Location: [place];  Service: Orthopedics;  Laterality: Right;   AMPUTATION Right 02/06/2022    Procedure: AMPUTATION BELOW KNEE;  Surgeon: Wylene Simmer, MD;  Location: [place];  Service: Orthopedics;  Laterality: Right;  regional block   CESAREAN SECTION  1970   Dr. Moses Manners, Per [facility] New Patient Packet   CESAREAN SECTION  1973   Dr. Moses Manners, Per [facility] New Patient Packet   CORONARY ARTERY BYPASS GRAFT      Allergies  Allergen Reactions   Penicillins Anaphylaxis    Outpatient Encounter Medications as of 07/29/2022  Medication Sig   acetaminophen (TYLENOL) 500 MG tablet Take 1,000 mg by mouth 2 (two) times daily as needed for moderate pain.   atorvastatin (LIPITOR) 40 MG tablet Take 40 mg by mouth at bedtime.   Biotin w/ Vitamins C & E (HAIR/SKIN/NAILS PO) Take 1 tablet by mouth in the morning.   brimonidine (ALPHAGAN) 0.2 % ophthalmic solution Place 1 drop into the left eye 3 (three) times daily.   calcium carbonate (OSCAL) 1500 (600 Ca) MG TABS tablet Take 600 mg of elemental calcium by mouth daily.   clopidogrel (PLAVIX) 75 MG tablet Take 75 mg by mouth in the morning.   Continuous Blood Gluc Sensor (FREESTYLE LIBRE 14 DAY SENSOR) MISC Inject 1 sensor to the skin every 14 days for continuous glucose monitoring.   glucosamine-chondroitin 500-400 MG tablet Take 1 tablet by mouth every morning.   insulin glargine (LANTUS SOLOSTAR) 100 UNIT/ML Solostar Pen Inject 18 Units into the skin daily. In the morning. PATIENT SELF ADMINISTERS   insulin lispro (HUMALOG) 100 UNIT/ML injection Inject 5 Units into the skin at bedtime.   insulin lispro (HUMALOG) 100 UNIT/ML KwikPen Inject 5-15 Units into the skin See admin instructions. If blood sugar is 151-200 = 5 units, 201-250 = 8 units, 251-300  = 10 units, 301-350 = 12 units, 351-400 = 14 units, 401 and above =  16.  If blood sugar is greater than 200 inject 5 units at bedtime, if greater than 500 call on call provider   Insulin Pen Needle (PEN NEEDLES 3/16") 31G X 5 MM MISC 1 Device by Does not apply route 5 (five) times daily. With insulin  administration   latanoprost (XALATAN) 0.005 % ophthalmic solution Place 1 drop into both eyes every evening.   lisinopril (ZESTRIL) 2.5 MG tablet Take 2.5 mg by mouth in the morning.   miconazole (MICOTIN) 200 MG vaginal suppository Place 200 mg vaginally at bedtime as needed (vaginal itching).   Multiple Vitamins-Minerals (I-VITE PO) Take 1 tablet by mouth in the morning.   Omega-3 Fatty Acids (FISH OIL) 1000 MG CAPS Take 1,000 mg by mouth in the morning.   omeprazole (PRILOSEC) 20 MG capsule Take 20 mg by mouth daily.   sennosides-docusate sodium (SENOKOT-S) 8.6-50 MG  tablet Take 1 tablet by mouth every other day. IN THE MORNING   timolol (TIMOPTIC) 0.5 % ophthalmic solution Place 1 drop into the left eye 2 (two) times daily.   [DISCONTINUED] docusate sodium (COLACE) 100 MG capsule Take 100 mg by mouth daily as needed for mild constipation.   [DISCONTINUED] nitroGLYCERIN (NITROSTAT) 0.4 MG SL tablet Place 0.4 mg under the tongue every 5 (five) minutes x 3 doses as needed for chest pain.   No facility-administered encounter medications on file as of 07/29/2022.    Review of Systems:  Review of Systems  Constitutional:  Negative for activity change and appetite change.  HENT: Negative.    Respiratory:  Negative for cough and shortness of breath.   Cardiovascular:  Negative for leg swelling.  Gastrointestinal:  Negative for constipation.  Genitourinary:  Positive for frequency.  Musculoskeletal:  Positive for gait problem. Negative for arthralgias and myalgias.  Skin: Negative.   Neurological:  Negative for dizziness and weakness.  Psychiatric/Behavioral:  Positive for confusion and sleep disturbance. Negative for dysphoric mood.     Health Maintenance  Topic Date Due   Medicare Annual Wellness (AWV)  Never done   FOOT EXAM  Never done   OPHTHALMOLOGY EXAM  Never done   Diabetic kidney evaluation - Urine ACR  Never done   Hepatitis C Screening  Never done   COLONOSCOPY (Pts  45-59yrs Insurance coverage will need to be confirmed)  Never done   MAMMOGRAM  Never done   Zoster Vaccines- Shingrix (1 of 2) Never done   Pneumonia Vaccine 69+ Years old (1 - PCV) Never done   DEXA SCAN  Never done   COVID-19 Vaccine (3 - Pfizer series) 02/20/2020   INFLUENZA VACCINE  04/22/2022   HEMOGLOBIN A1C  07/27/2022   Diabetic kidney evaluation - GFR measurement  07/18/2023   TETANUS/TDAP  07/11/2024   HPV VACCINES  Aged Out    Physical Exam: Vitals:   07/29/22 0911  BP: 130/66  Pulse: 77  Resp: 17  Temp: 97.8 F (36.6 C)  TempSrc: Temporal  SpO2: 98%  Weight: 124 lb (56.2 kg)  Height: 5\' 7"  (1.702 m)   Body mass index is 19.42 kg/m. Physical Exam Vitals reviewed.  Constitutional:      Appearance: Normal appearance.  HENT:     Head: Normocephalic.     Right Ear: Tympanic membrane normal.     Left Ear: Tympanic membrane normal.     Ears:     Comments: Small amount of wax in Left ear    Nose: Nose normal.     Mouth/Throat:     Mouth: Mucous membranes are moist.     Pharynx: Oropharynx is clear.  Eyes:     Pupils: Pupils are equal, round, and reactive to light.  Cardiovascular:     Rate and Rhythm: Normal rate and regular rhythm.     Pulses: Normal pulses.     Heart sounds: Normal heart sounds. No murmur heard. Pulmonary:     Effort: Pulmonary effort is normal.     Breath sounds: Normal breath sounds.  Abdominal:     General: Abdomen is flat. Bowel sounds are normal.     Palpations: Abdomen is soft.  Musculoskeletal:        General: No swelling.     Cervical back: Neck supple.     Comments: S/p Right BKA  Skin:    General: Skin is warm.  Neurological:     General: No focal deficit present.  Mental Status: She is alert and oriented to person, place, and time.  Psychiatric:        Mood and Affect: Mood normal.        Thought Content: Thought content normal.     Labs reviewed: Basic Metabolic Panel: Recent Labs    10/28/21 1039  12/18/21 0000 02/06/22 0612 02/07/22 0222 02/11/22 0000 02/20/22 0000 02/20/22 0247 04/22/22 0000 07/17/22 0000  NA 138   < > 137 137   < > 143  --  136* 143  K 4.2   < > 4.3 4.4   < > 4.3  --  4.7 4.7  CL 100   < > 101 104   < > 103  --  98* 106  CO2 32   < > 27 27   < > 26*  --  24* 29*  GLUCOSE 249*  --  277* 248*  --   --   --   --   --   BUN 24   < > 24* 22   < > 23*  --  34* 25*  CREATININE 0.99   < > 0.79 0.86   < > 0.7  --  0.9 1.0  CALCIUM 9.8   < > 9.4 8.5*   < >  --  9.2 9.6 9.2  TSH  --   --   --   --   --   --   --  3.78  --    < > = values in this interval not displayed.   Liver Function Tests: Recent Labs    08/19/21 0000 10/28/21 1039 12/18/21 0000 04/22/22 0000 07/17/22 0000  AST 16 19 14 18 18   ALT 11 17 9 15 13   ALKPHOS 144*  --  139* 118 65  BILITOT  --  0.4  --   --   --   PROT  --  6.9  --   --   --   ALBUMIN 3.6  --  3.2*  --  3.7   No results for input(s): "LIPASE", "AMYLASE" in the last 8760 hours. No results for input(s): "AMMONIA" in the last 8760 hours. CBC: Recent Labs    09/06/21 0937 10/02/21 0725 10/28/21 1039 12/18/21 0000 02/06/22 0612 02/07/22 0222 02/11/22 0000 03/17/22 0000 04/22/22 0000 07/17/22 0000  WBC 7.1  --  6.5   < > 7.7 7.6   < > 6.4 5.5 5.2  NEUTROABS 4,395  --  3,562  --   --   --   --   --  3.20  --   HGB 10.1*   < > 10.4*   < > 10.2* 8.5*   < > 10.3* 11.2* 10.7*  HCT 31.2*   < > 32.3*   < > 31.7* 26.6*   < > 31* 33* 32*  MCV 91.0  --  88.7  --  88.5 87.5  --   --   --   --   PLT 357  --  286   < > 367 325   < > 288 262 266   < > = values in this interval not displayed.   Lipid Panel: Recent Labs    04/22/22 0000  CHOL 173  HDL 81*  LDLCALC 76  TRIG 77   Lab Results  Component Value Date   HGBA1C 12.7 01/24/2022    Procedures since last visit: No results found.  Assessment/Plan 1. Cognitive impairment MMSE 29/30 Cognition issue noticed by family and  staff Has appointment with Neurology  2.  Daytime sleepiness Continues to be issue with fatigue Appointment with Neurology  3. 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 S/P BKA (below knee amputation) unilateral, right ([place]) Working with therapy and her Prosthesis  5. Moderate episode of recurrent major depressive disorder ([place]) Wants to see Neurology before changing anything  6. Anemia of chronic disease Will check B 12 and Iron before next visit  7. Ischemic cardiomyopathy EF 50% in 09/22 and h/o NSTEMI On Plavix and statin Needs follow up with her cardiology 8. Diabetic nephropathy associated with type 1 diabetes mellitus (HCC) On Lisinopril 9 HLD On Statin LDL 83 10 Primary open angle glaucoma (POAG) of both eyes, moderate stage Poor Vision Follows with Opthalmologis 11H/o Osteoporosis Will order DEXA Labs/tests ordered:  Iron studies and B12, DEXA PCV 20 Next appt:  Visit date not found

## 2022-07-29 NOTE — Patient Instructions (Signed)
For wound healing and best skin integrity: -Black or grey vivewear socks need to be in direct contact with the open wound (no tegaderm over wound) in order for wounds to heal. Switch to a clean vivewear every time the prosthesis is put on.  When taking old vivewear off be prepared for flow of blood from wounds due to wet-to-dry dressing if blood dries to sock.  Stop this with a cloth prior to cleaning over and around wound with a wet cloth gently and re-donning clean vivewear under liner. Wear the vivewear and liner only for 2 hours in the morning and 2 hours in the afternoon and evening.  Leaving the prosthesis off until next PT visit 07/31/2022.

## 2022-07-29 NOTE — Therapy (Signed)
OUTPATIENT PHYSICAL THERAPY TREATMENT NOTE   Patient Name: Caitlin Sharp MRN: [id number] DOB:[birth year], 71 y.o., female Today's Date: 07/29/2022  PCP: Einar Crow, MD REFERRING PROVIDER: Alfredo Martinez, PA-C  END OF SESSION:   PT End of Session - 07/29/22 1453     Visit Number 5    Number of Visits 17    Date for PT Re-Evaluation 09/07/22    Authorization Type [employer] Medicare (10th visit PN needed)    PT Start Time 1451   PT ran over with pt prior.   PT Stop Time 1551    PT Time Calculation (min) 60 min    Equipment Utilized During Treatment Gait belt    Activity Tolerance Patient tolerated treatment well;Other (comment)   Son is concerned about pt blood sugar as she nods off during session several times briefly.  Pt has removed sensor so unable to check blood sugar.   Behavior During Therapy WFL for tasks assessed/performed              Past Medical History:  Diagnosis Date   Abnormal thyroid blood test    Per Records recevied from [facility], Dr.Pearson   Acute deep vein thrombosis of right peroneal vein (HCC) 10/05/2020   RLE DVT   Anxiety    Arthritis    RA   Coronary artery disease    Diabetes type I [facility])    Per [facility] New Patient Packet   Diabetic retinopathy (HCC)    Per [facility] New Patient Packet   DKA (diabetic ketoacidosis) (HCC) 2021   Dr.William Zackowski.  Insulin Pump was out of Insulin   Glaucoma    Per [facility] New Patient Packet   H/O heart bypass surgery 2012   Dr. Excell Seltzer, Per [facility] New Patient Packet   Heart murmur    when I was a teenager   History of blood transfusion    History of kidney stones    passed   Hx of CABG    Per records received form [facility], Dr.Pearson    Left bundle branch block    Mild mitral regurgitation    Per records from [facility], Dr.Pearson    Neuropathy 1976   Per [facility] New Patient Packet   NSTEMI (non-ST elevated myocardial infarction) (HCC) 09/21/2020   NSTEMI/demand ischemic in setting of DKA, patent grafts by  09/24/20 [facility], continue medical therapy   Osteoporosis    T Score -2.7 , Per [facility] New Patient Packet   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 due to underlying condition [facility])    Per Records recevied from [facility], Dr.Pearson   PVD (peripheral vascular disease) (HCC) 10/27/2021   Type 1 diabetes mellitus with hyperglycemia (HCC) 10/28/2021   Past Surgical History:  Procedure Laterality Date   ABDOMINAL AORTOGRAM W/LOWER EXTREMITY N/A 10/02/2021   Procedure: ABDOMINAL AORTOGRAM W/LOWER EXTREMITY;  Surgeon: Victorino Sparrow, MD;  Location[facility] INVASIVE CV LAB;  Service: Cardiovascular;  Laterality: N/A;   AMPUTATION Right 04/17/2021   Procedure: RIGHT GREAT TOE AMPUTATION;  Surgeon: [name], MD;  Location: [facility] OR;  Service: Orthopedics;  Laterality: Right;   AMPUTATION Right 07/19/2021   Procedure: RIGHT FOOT 1ST RAY AMPUTATION;  Surgeon: [name], MD;  Location: [facility] OR;  Service: Orthopedics;  Laterality: Right;   AMPUTATION Right 02/06/2022   Procedure: AMPUTATION BELOW KNEE;  Surgeon: Toni Arthurs, MD;  Location: [facility] OR;  Service: Orthopedics;  Laterality: Right;  regional block   CESAREAN SECTION  1970   Dr. Moses Manners, Per [facility] New Patient Packet   CESAREAN SECTION  1973   Dr. 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 ([place]) 07/29/2022   Ischemic cardiomyopathy 07/29/2022   Moderate episode of recurrent major depressive disorder ([place]) 07/29/2022   Anemia of chronic disease 07/29/2022   S/P BKA (below knee amputation) unilateral, right ([address]) 07/29/2022   Daytime sleepiness 07/29/2022   Cognitive impairment 07/29/2022   Osteomyelitis ([place]) 02/06/2022   Osteomyelitis of right foot ([place]) 02/06/2022   Type 1 diabetes mellitus with hyperglycemia ([place]) 10/28/2021   PVD (peripheral vascular disease) ([place])  10/27/2021   Osteomyelitis of great toe of right foot (HCC)    NSVT (nonsustained ventricular tachycardia) ([place]) 01/17/2021   Acute deep vein thrombosis (DVT) of right peroneal vein ([place]) 10/17/2020   Insulin long-term use ([place]) 07/18/2020   Bruit of left carotid artery 10/31/2019   Coronary artery disease involving native coronary artery of native heart without angina pectoris 10/31/2019   Open-angle glaucoma of right eye 10/31/2019   Dry eyes 03/01/2018   Presence of intraocular lens 10/18/2015   Cystoid macular edema 06/14/2012    REFERRING DIAG: Z89.511 (ICD-10-CM) - Acquired absence of right leg below knee Z47.89 (ICD-10-CM) - Encounter for prosthetic gait training    THERAPY DIAG:  Unsteadiness on feet  Other abnormalities of gait and mobility  Abnormal posture  Muscle weakness (generalized)  Rationale for Evaluation and Treatment Rehabilitation  PERTINENT HISTORY: Hx of BKA to the right leg due to osteomyelitis 02/06/22. Worked with therapy and then transferred to assisted living. She has been a Type 1 DM for over 50 years. Used to be on a insulin pump now on lantus and meal coverage.  Recently saw endocrinology and they lowered her lantus as she voiced concern for hypoglycemia. A1C 8.6 on 04/22/22 which is improved  She keeps getting skin tears from hitting objects such as her WC She is awaiting a prosthesis from [facility].  Was treated for thigh cellulitis due to an infected skin tear with Doxycycline on 04/29/22.  She reports the area is much improved. Now has a skin tear to the LLE that has some mild yellow drainage and redness per nursing staff that is concerning.   PRECAUTIONS: Fall  SUBJECTIVE:                                                                                                                                                                                      SUBJECTIVE STATEMENT:  Pt presents w/ son this visit.  She has been walking with CNAs and granddaughter  down halls of [facility].  He states she walked this morning and estimates the distance to be to the doorway of clinic and back (~200').  Pt states she has been following wear schedule and thinks her limb is getting tougher.   PAIN:  Are you having pain? No   OBJECTIVE: (objective measures completed at initial evaluation unless otherwise dated)  TODAY'S TREATMENT:  CURRENT PROSTHETIC WEAR ASSESSMENT: Patient is independent with:  dependent with all aspects of prosthetic care Patient is dependent with: skin check, residual limb care, prosthetic cleaning, ply sock cleaning, correct ply sock adjustment, proper wear schedule/adjustment, proper weight-bearing schedule/adjustment Donning prosthesis: MaxA  Doffing prosthesis: Max A due to friction in socket Prosthetic wear tolerance: Pt has been wearing prosthesis consistently 2 hrs, 2x/day since last Friday!!  Prosthetic weight bearing tolerance: ~2-4 minutes at a time Edema: none noted Residual limb condition: vary small healing skin tear (dry and closed, pink)  Prosthetic description: Pin lock suspension K code/activity level with prosthetic use: Level 3  -Prosthesis doffed maxA w/ pt seated in wheelchair.  Upon doffing prosthesis PT visualizes blood on partial sock and upon doffing black Vivewear sock noted tegaderm over medial area of distal residual limb.  Piece of gauze placed under tegaderm saturated w/ bright red blood.  Upon assessing skin of residual limb, slight redness noted around scar indentation on lateral aspect of the scar.  Another skin tear, not covered with any dressing, appearing worse than the medial one noted on weight bearing aspect of lateral residual limb.  Pt denies knowing how these occurred or exactly when, but son feels likely it happened late yesterday or earlier today.  Possibly related to poor socket fit and/or weight bearing while too far in socket.  Bright redness noted directly over entire patella and medial and  lateral aspect of the superior tibia and fibula.  Treating PT consults primary PT regarding skin integrity change.   -Initially tegaderm placed over most distal uncovered skin tear.  This was removed later in addition to the tegaderm on the medial aspect of the limb in order to clean the limb and re-don a smaller (2XL) Vivewear.  Limb was cleaned w/ wet cloth, no active bleeding occurred.  Pt currently wears a 4XL.  Calf measurement taken today to assess shrinker fit:  28cm (27cm on initial measurement, tape measurer in taut position), thigh measurement 29.5cm in taut position.  -Prosthesis and layers donned and doffed with pt in sitting to assess prosthetic fit and pressure areas with possible needed socket modifications discussed w/ pt and son.  -Extensive education on Vivewear, skin healing, risk of wet-to-dry debridement w/ Vivewear removal, and provided typed noted for CNAs.  Further discussed hygiene management and wash/drying Vivewear, but not drying socks.  Edu to continue wear schedule for Vivewear and liner ONLY until next visit 11/9.  Do not weight-bear until next visit to reassess skin.  Leave prosthesis off until then.  See below for further edu.  Secondary PT provided edu on positioning in bed to prevent direct pressure on most distal wound.   This was held this visit.  See edu. PATIENT EDUCATED ON FOLLOWING PROSTHETIC CARE: Prosthetic wear tolerance: 4 hours/day, 7 days/week Prosthetic weight bearing tolerance: 2-3 minutes Other education  Skin check, Residual limb care, Care of non-amputated limb, Prosthetic cleaning, Ply sock cleaning, Correct ply sock adjustment, [place]pper donning, Proper doffing, Proper wear schedule/adjustment, and  Proper weight-bearing schedule/adjustment   PATIENT EDUCATION: Education details: Continued to encourage having a CNA come in to session to observe donning/doffing to assist pt.   PROVIDED FOR CNAS AND PATIENT: For wound healing and best skin  integrity: -Black or grey vivewear socks need to be in direct contact with the open wound (no tegaderm over wound) in order for wounds to heal. Switch to a clean vivewear every time the prosthesis is put on.  When taking old vivewear off be prepared for flow of blood from wounds due to wet-to-dry dressing if blood dries to sock.  Stop this with a cloth prior to cleaning over and around wound with a wet cloth gently and re-donning clean vivewear under liner. Wear the vivewear and liner only for 2 hours in the morning and 2 hours in the afternoon and evening.  Leaving the prosthesis off until next PT visit 07/31/2022. Person educated: Patient and Child(ren) Education method: Explanation, Demonstration, Tactile cues, and Verbal cues Education comprehension: verbalized understanding, verbal cues required, tactile cues required, and needs further education  HOME EXERCISE PROGRAM: PT provided printout for CNAs via son 07/18/2022:  Please help pt don prosthesis w/ grey wool vivewear sock and liner followed by 5 ply half (cut) sock and 1 ply full sock (socks will need further adjustment as pt begins tolerating more weight-bearing).  Have pt practice standing and sitting at counter or sink and remain standing x2-5 minutes as pt can tolerate.  She needs to continue wearing the leg 2x a day for 2 hours at a time ideally once in the morning and once in the evening.  Pt is okay to continue wearing the grey shrinker when not wearing the prosthesis.   Access Code: [email or web address] URL: [email or web address] Date: 07/18/2022 Prepared by: Elease Etienne   Exercises - Standing March with Counter Support  - 2 x daily - 7 x weekly - 1 sets - 10 reps - Seated Knee Flexion Extension AROM   - 2 x daily - 7 x weekly - 1 sets - 10 reps - Continue standing at sink w/ caregiver as able throughout the day w/ prosthesis donned.  ASSESSMENT:   CLINICAL IMPRESSION: This session spent assessing and problem solving  acute skin breakdown.  Extensive edu on alteration of wear schedule to liner and Vivewear socks only.  Discussion of not wearing prosthesis or weight bearing until next visit 07/31/2022.  Residual limb cleaned and measured for best Vivewear fit.  Will continue to monitor changes at next visit and adjust POC accordingly.    OBJECTIVE IMPAIRMENTS Abnormal gait, decreased activity tolerance, decreased balance, decreased endurance, decreased knowledge of use of DME, decreased mobility, difficulty walking, decreased ROM, decreased strength, impaired perceived functional ability, impaired flexibility, postural dysfunction, and prosthetic dependency .    ACTIVITY LIMITATIONS carrying, lifting, bending, standing, squatting, stairs, transfers, bathing, toileting, dressing, reach over head, hygiene/grooming, and locomotion level   PARTICIPATION LIMITATIONS: cleaning, laundry, driving, shopping, and community activity   PERSONAL FACTORS 3+ comorbidities: see above  are also affecting patient's functional outcome.    REHAB POTENTIAL: Good   CLINICAL DECISION MAKING: Evolving/moderate complexity   EVALUATION COMPLEXITY: Moderate     GOALS: Goals reviewed with patient? Yes   SHORT TERM GOALS: Target date: 08/06/2022     Pt/caregiver will be IND with initial HEP in order to indicate improved functional mobility and dec fall risk. Baseline: Goal status: INITIAL   2.  Pt will be able to don/doff prosthesis at min A level and tolerate  6 hours of wear time per day without skin break down. Baseline:  Goal status: INITIAL   3.  Pt will tolerate standing x 5 mins with intermittent support at S level in order to indicate improved independence with ADLs.  Baseline:  Goal status: INITIAL   4.  Will assess BERG balance when able and set LTG to reflect decreased fall risk.  Baseline:  Goal status: INITIAL   5.  Will assess gait speed and set LTG to reflect progress.  Baseline:  Goal status: INITIAL    6.  Pt will ambulate x 100' with RW and prosthesis at min A level in order to indicate improved household mobility.  Baseline:  Goal status: INITIAL   LONG TERM GOALS: Target date: 09/07/2022     Pt will be IND with final HEP in order to indicate improved functional mobility and dec fall risk. Baseline:  Goal status: INITIAL   2.  Pt will ambulate x 150' indoors and 300' outdoors over unlevel paved surfaces with RW at S level in order to indicate improved household and community mobility.  Baseline:  Goal status: INITIAL   3.  Pt will negotiate up/down curb/ramp with RW and 4 steps with rails at S level in order to indicate improved community access.   Baseline:  Goal status: INITIAL   4.  Pt will tolerate standing x 10 mins with intermittent UE support while performing ADLs in order to indicate improved household independence.  Baseline:  Goal status: INITIAL   5.  Will add BERG goal upon assessment.  Baseline:  Goal status: INITIAL   6.  Will add gait speed goal upon assessment  Baseline:  Goal status: INITIAL   7.  Pt will don/doff prosthesis at S level and tolerate wearing prosthesis >/=90% of awake time in order to indicate more independent prosthetic care.   Baseline:  Goal status: INITIAL     PLAN: PT FREQUENCY: 2x/week   PT DURATION: 8 weeks   PLANNED INTERVENTIONS: Therapeutic exercises, Therapeutic activity, Neuromuscular re-education, Balance training, Gait training, Patient/Family education, Self Care, Joint mobilization, Stair training, Vestibular training, Prosthetic training, DME instructions, and Manual therapy   PLAN FOR NEXT SESSION: Perform sink HEP (smartphrase) and give to pt/caregiver.  If fit is better after seeing [facility], I would like to okay her to walk with CNAs.  If CNA available in session, have them assist with donning/doffing for carryover at ALF.  Continue prosthetic education.  Prosthetic gait.  I think we are going to have to do half socks  at bottom because she is sinking too far in socket, but because of her bone structure its difficult to get over tib/fib.  Elease Etienne, PT, [place] [address] [place], [place], [address] Phone: [phone]   Fax:  [phone] 07/29/22, 5:50 PM

## 2022-07-31 ENCOUNTER — Ambulatory Visit: Payer: Medicare Other | Admitting: Rehabilitation

## 2022-07-31 ENCOUNTER — Encounter: Payer: Self-pay | Admitting: Rehabilitation

## 2022-07-31 DIAGNOSIS — R2681 Unsteadiness on feet: Secondary | ICD-10-CM | POA: Diagnosis not present

## 2022-07-31 DIAGNOSIS — R293 Abnormal posture: Secondary | ICD-10-CM

## 2022-07-31 DIAGNOSIS — R2689 Other abnormalities of gait and mobility: Secondary | ICD-10-CM

## 2022-07-31 DIAGNOSIS — M6281 Muscle weakness (generalized): Secondary | ICD-10-CM

## 2022-07-31 NOTE — Therapy (Signed)
OUTPATIENT PHYSICAL THERAPY TREATMENT NOTE   Patient Name: Caitlin Sharp MRN: [id number] DOB:04-16-1951, 71 y.o., female Today's Date: 07/31/2022  PCP: Veleta Miners, MD REFERRING PROVIDER: Mechele Claude, PA-C  END OF SESSION:   PT End of Session - 07/31/22 1145     Visit Number 6    Number of Visits 17    Date for PT Re-Evaluation 09/07/22    Authorization Type [employer] Medicare (10th visit PN needed)    PT Start Time 0931    PT Stop Time 1018    PT Time Calculation (min) 47 min    Equipment Utilized During Treatment Gait belt    Activity Tolerance Patient tolerated treatment well;Other (comment)   Son is concerned about pt blood sugar as she nods off during session several times briefly.  Pt has removed sensor so unable to check blood sugar.   Behavior During Therapy WFL for tasks assessed/performed              Past Medical History:  Diagnosis Date   Abnormal thyroid blood test    Per Records recevied from [facility], Dr.Pearson   Acute deep vein thrombosis of right peroneal vein ([place]) 10/05/2020   RLE DVT   Anxiety    Arthritis    RA   Coronary artery disease    Diabetes type I [facility])    Per [facility] New Patient Packet   Diabetic retinopathy ([place])    Per [facility] New Patient Packet   DKA (diabetic ketoacidosis) ([place]) 2021   Dr.William Zackowski.  Insulin Pump was out of Insulin   Glaucoma    Per [place] Patient Packet   H/O heart bypass surgery 2012   Dr. Luana Shu, Per [facility] New Patient Packet   Heart murmur    when I was a teenager   History of blood transfusion    History of kidney stones    passed   Hx of CABG    Per records [facility], Dr.Pearson    Left bundle branch block    Mild mitral regurgitation    Per records from [facility], [place]    Neuropathy 1976   Per [place] Patient Packet   NSTEMI (non-ST elevated myocardial infarction) ([place]) 09/21/2020   NSTEMI/demand ischemic in setting of DKA, patent grafts by 09/24/20 [facility], continue medical  therapy   Osteoporosis    T Score -2.7 , Per [place] New Patient Packet   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 due to underlying condition [facility])    Per Records recevied from [facility], Dr.Pearson   PVD (peripheral vascular disease) ([place]) 10/27/2021   Type 1 diabetes mellitus with hyperglycemia ([place]) 10/28/2021   Past Surgical History:  Procedure Laterality Date   ABDOMINAL AORTOGRAM W/LOWER EXTREMITY N/A 10/02/2021   Procedure: ABDOMINAL AORTOGRAM W/LOWER EXTREMITY;  Surgeon: Broadus John, MD;  Location: [place] CV LAB;  Service: Cardiovascular;  Laterality: N/A;   AMPUTATION Right 04/17/2021   Procedure: RIGHT GREAT TOE AMPUTATION;  Surgeon: Newt Minion, MD;  Location: [place];  Service: Orthopedics;  Laterality: Right;   AMPUTATION Right 07/19/2021   Procedure: RIGHT FOOT 1ST RAY AMPUTATION;  Surgeon: Newt Minion, MD;  Location: [place];  Service: Orthopedics;  Laterality: Right;   AMPUTATION Right 02/06/2022   Procedure: AMPUTATION BELOW KNEE;  Surgeon: Wylene Simmer, MD;  Location: [place];  Service: Orthopedics;  Laterality: Right;  regional block   CESAREAN SECTION  1970   Dr. Moses Manners,  Per [facility] New Patient Packet   CESAREAN SECTION  1973   Dr. 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) 07/29/2022   Ischemic cardiomyopathy 07/29/2022   Moderate episode of recurrent major depressive disorder (HCC) 07/29/2022   Anemia of chronic disease 07/29/2022   S/P BKA (below knee amputation) unilateral, right (HCC) 07/29/2022   Daytime sleepiness 07/29/2022   Cognitive impairment 07/29/2022   Osteomyelitis (HCC) 02/06/2022   Osteomyelitis of right foot (HCC) 02/06/2022   Type 1 diabetes mellitus with hyperglycemia (HCC) 10/28/2021   PVD (peripheral vascular disease) (HCC) 10/27/2021   Osteomyelitis of  great toe of right foot (HCC)    NSVT (nonsustained ventricular tachycardia) (HCC) 01/17/2021   Acute deep vein thrombosis (DVT) of right peroneal vein (HCC) 10/17/2020   Insulin long-term use (HCC) 07/18/2020   Bruit of left carotid artery 10/31/2019   Coronary artery disease involving native coronary artery of native heart without angina pectoris 10/31/2019   Open-angle glaucoma of right eye 10/31/2019   Dry eyes 03/01/2018   Presence of intraocular lens 10/18/2015   Cystoid macular edema 06/14/2012    REFERRING DIAG: Z89.511 (ICD-10-CM) - Acquired absence of right leg below knee Z47.89 (ICD-10-CM) - Encounter for prosthetic gait training    THERAPY DIAG:  Unsteadiness on feet  Other abnormalities of gait and mobility  Abnormal posture  Muscle weakness (generalized)  Rationale for Evaluation and Treatment Rehabilitation  PERTINENT HISTORY: Hx of BKA to the right leg due to osteomyelitis 02/06/22. Worked with therapy and then transferred to assisted living. She has been a Type 1 DM for over 50 years. Used to be on a insulin pump now on lantus and meal coverage.  Recently saw endocrinology and they lowered her lantus as she voiced concern for hypoglycemia. A1C 8.6 on 04/22/22 which is improved  She keeps getting skin tears from hitting objects such as her WC She is awaiting a prosthesis from [employer].  Was treated for thigh cellulitis due to an infected skin tear with Doxycycline on 04/29/22.  She reports the area is much improved. Now has a skin tear to the LLE that has some mild yellow drainage and redness per nursing staff that is concerning.   PRECAUTIONS: Fall  SUBJECTIVE:                                                                                                                                                                                      SUBJECTIVE STATEMENT:   Pt did not recall this  PT and other PT checking her skin or any part of session on Tuesday.  She is tearful  today reporting that the facility "tells her son and thinks that she has dementia."    PAIN:  Are you having pain? No   OBJECTIVE: (objective measures completed at initial evaluation unless otherwise dated)  TODAY'S TREATMENT:  CURRENT PROSTHETIC WEAR ASSESSMENT: Patient is independent with:  dependent with all aspects of prosthetic care Patient is dependent with: skin check, residual limb care, prosthetic cleaning, ply sock cleaning, correct ply sock adjustment, proper wear schedule/adjustment, proper weight-bearing schedule/adjustment Donning prosthesis: MaxA  Doffing prosthesis: Max A due to friction in socket Prosthetic wear tolerance: Pt has been wearing prosthesis consistently 2 hrs, 2x/day since last Friday!!  Prosthetic weight bearing tolerance: ~2-4 minutes at a time Edema: none noted Residual limb condition: vary small healing skin tear (dry and closed, pink)  Prosthetic description: Pin lock suspension K code/activity level with prosthetic use: Level 3      PATIENT EDUCATED ON FOLLOWING PROSTHETIC CARE: Prosthetic wear tolerance: 4 hours/day, 7 days/week Prosthetic weight bearing tolerance: 2-3 minutes Other education  Skin check, Residual limb care, Care of non-amputated limb, Prosthetic cleaning, Ply sock cleaning, Correct ply sock adjustment, Propper donning, Proper doffing, Proper wear schedule/adjustment, and Proper weight-bearing schedule/adjustment    GAIT: Gait pattern: step to pattern, step through pattern, decreased stride length, lateral hip instability, trunk flexed, and narrow BOS Distance walked: 30' x 3 Assistive device utilized: Environmental consultant - 2 wheeled Level of assistance: Min A Comments: Jamey Reas, PT here from [facility] to assist with skin assessment.  Note that wounds do seem to be healing well, small scabbed area at distal end is slightly smaller at approx 2-34mm and area on lateral distal end mostly healed with pinpoint scab on this area.  Shirlean Mylar did  bring 2 XL vivewear with her to session to cut and don during session.  We did this and note better wear/compression with this size so recommend she alternate these when wearing leg and wearing larger one with shrinker when not wearing prosthesis.  Again, note that her pin is very long therefore she does still get many clicks when initially donned.  Added 1 ply and had her stand/walk and note patella still too far in socket, therefore added 3 ply (total 4 ply) and ambulated another 93' with RW.  Pt did very well with second bout of gait with more comfort.  Had also added theraband around walker with two different colors for where to "aim" feet to encourage less adduction.  We did mark a couple areas that would be better if trimmed on prosthesis, so will call and talk to bobby at [facility] regarding this.    Overall, she did very well today, pt able to assist with donning socks and pushing limb into socket today.  Will continue to work on this in future sessions.    Provided this to patient and caregiver (aide from [employer]) during session:   INSTRUCTIONS FOR CNAS ON DONNING/DOFFING PROSTHESIS:   1:  WHEN WEARING PROSTHESIS, DON SNUG (SMALLER) BLACK CUT OFF SOCK FIRST UNDER SILICONE LINER.  HAVE HER SCOOT TO EDGE OF CHAIR.  THIS SHOULD BE DIRECTLY ON SKIN.  IF SHE IS HAVING BLEEDING OR IT GETS SOILED, SWITCH TO HER OTHER BLACK CUT OFF SOCK.  WHEN NOT WEARING PROSTHESIS, WEAR BLACK CUT OFF SOCK THAT IS LARGER WITH GRAY SHRINKER WITH RING.    2: INVERT LINER AND ENSURE THAT END IS AS FLAT  AS POSSIBLE AND ENSURE PIN IS IN LINE WITH BONE.     3: WHEN DONNING WHITE SOCKS, HAVE HER TRY AND BUNDLE UP AND PLACE MIDDLE FINGERS IN HOLE SO THAT SHE KNOWS WHERE HOLE IS WHEN PUTTING ON.     3: PLACE PROSTHESIS ON FLOOR AND TIP BACK ON HEEL, HAVE HER PLACE LIMB INSIDE TO HEAR AT LEAST ONE CLICK PRIOR TO STANDING.  WORK ON HAVING HER PUSH HER KNEE DOWN INTO SOCKET.    4:  CAN PLACE SMALL DOT OF BABY/MINERAL OIL ON  KNEE CAP AND RUB UPWARD TO PREVENT FRICTION RUBBING FROM LINER.    5: BEGIN WEARING PROSTHESIS 2 HOURS ON THEN DO 2 HOURS OFF, THEN 2 HOURS ON (CAN DO 2-3 TIMES A DAY LIKE THIS).  AFTER A WEEK, IF SKIN STILL LOOKS GOOD/HEALING, INCREASE TO 3 HOURS ON, 2 HOURS OFF, 3 HOURS ON.  (CAN DO 2-3 TIMES PER DAY AS WELL).     PATIENT EDUCATION:  See above   HOME EXERCISE PROGRAM:    Access Code: [email or web address] URL: [email or web address] Date: 07/18/2022 Prepared by: Elease Etienne   Exercises - Standing March with Counter Support  - 2 x daily - 7 x weekly - 1 sets - 10 reps - Seated Knee Flexion Extension AROM   - 2 x daily - 7 x weekly - 1 sets - 10 reps - Continue standing at sink w/ caregiver as able throughout the day w/ prosthesis donned.  ASSESSMENT:   CLINICAL IMPRESSION: Skilled session with Jamey Reas, PT to better assess wounds and come up with wear plan and also assess prosthetic fit/sock adjustment.  Pt tearful in session as she feels facility she lives in is "telling everyone" she has dementia, however she does seem to have some memory deficits as she did not recall our session on Tuesday.     OBJECTIVE IMPAIRMENTS Abnormal gait, decreased activity tolerance, decreased balance, decreased endurance, decreased knowledge of use of DME, decreased mobility, difficulty walking, decreased ROM, decreased strength, impaired perceived functional ability, impaired flexibility, postural dysfunction, and prosthetic dependency .    ACTIVITY LIMITATIONS carrying, lifting, bending, standing, squatting, stairs, transfers, bathing, toileting, dressing, reach over head, hygiene/grooming, and locomotion level   PARTICIPATION LIMITATIONS: cleaning, laundry, driving, shopping, and community activity   PERSONAL FACTORS 3+ comorbidities: see above  are also affecting patient's functional outcome.    REHAB POTENTIAL: Good   CLINICAL DECISION MAKING: Evolving/moderate complexity    EVALUATION COMPLEXITY: Moderate     GOALS: Goals reviewed with patient? Yes   SHORT TERM GOALS: Target date: 08/06/2022     Pt/caregiver will be IND with initial HEP in order to indicate improved functional mobility and dec fall risk. Baseline: Goal status: INITIAL   2.  Pt will be able to don/doff prosthesis at min A level and tolerate 6 hours of wear time per day without skin break down. Baseline:  Goal status: INITIAL   3.  Pt will tolerate standing x 5 mins with intermittent support at S level in order to indicate improved independence with ADLs.  Baseline:  Goal status: INITIAL   4.  Will assess BERG balance when able and set LTG to reflect decreased fall risk.  Baseline:  Goal status: INITIAL   5.  Will assess gait speed and set LTG to reflect progress.  Baseline:  Goal status: INITIAL   6.  Pt will ambulate x 100' with RW and prosthesis at min A level in order to indicate improved household mobility.  Baseline:  Goal status: INITIAL   LONG TERM GOALS: Target date: 09/07/2022     Pt will be IND with final HEP in order to indicate improved functional mobility and dec fall risk. Baseline:  Goal status: INITIAL   2.  Pt will ambulate x 150' indoors and 300' outdoors over unlevel paved surfaces with RW at S level in order to indicate improved household and community mobility.  Baseline:  Goal status: INITIAL   3.  Pt will negotiate up/down curb/ramp with RW and 4 steps with rails at S level in order to indicate improved community access.   Baseline:  Goal status: INITIAL   4.  Pt will tolerate standing x 10 mins with intermittent UE support while performing ADLs in order to indicate improved household independence.  Baseline:  Goal status: INITIAL   5.  Will add BERG goal upon assessment.  Baseline:  Goal status: INITIAL   6.  Will add gait speed goal upon assessment  Baseline:  Goal status: INITIAL   7.  Pt will don/doff prosthesis at S level and  tolerate wearing prosthesis >/=90% of awake time in order to indicate more independent prosthetic care.   Baseline:  Goal status: INITIAL     PLAN: PT FREQUENCY: 2x/week   PT DURATION: 8 weeks   PLANNED INTERVENTIONS: Therapeutic exercises, Therapeutic activity, Neuromuscular re-education, Balance training, Gait training, Patient/Family education, Self Care, Joint mobilization, Stair training, Vestibular training, Prosthetic training, DME instructions, and Manual therapy   PLAN FOR NEXT SESSION: How is wear time going? Is she up to 3 hours 2-3times per day? I recommended WB no more than 4-5 mins at a time before resting several minutes.  Continue to check skin.  Continue prosthetic education.  Prosthetic gait.  have her work on donning/doffing prosthesis.  Start sink HEP/standing exercise.   Cameron Sprang, PT, MPT [facility] [address] [place], [place], [address] Phone: [phone]   Fax:  [phone] 07/31/22, 11:46 AM

## 2022-07-31 NOTE — Patient Instructions (Signed)
INSTRUCTIONS FOR CNAS ON DONNING/DOFFING PROSTHESIS:  1:  WHEN WEARING PROSTHESIS, DON SNUG (SMALLER) BLACK CUT OFF SOCK FIRST UNDER SILICONE LINER.  HAVE HER SCOOT TO EDGE OF CHAIR.  THIS SHOULD BE DIRECTLY ON SKIN.  IF SHE IS HAVING BLEEDING OR IT GETS SOILED, SWITCH TO HER OTHER BLACK CUT OFF SOCK.  WHEN NOT WEARING PROSTHESIS, WEAR BLACK CUT OFF SOCK THAT IS LARGER WITH GRAY SHRINKER WITH RING.   2: INVERT LINER AND ENSURE THAT END IS AS FLAT AS POSSIBLE AND ENSURE PIN IS IN LINE WITH BONE.    3: WHEN DONNING WHITE SOCKS, HAVE HER TRY AND BUNDLE UP AND PLACE MIDDLE FINGERS IN HOLE SO THAT SHE KNOWS WHERE HOLE IS WHEN PUTTING ON.    3: PLACE PROSTHESIS ON FLOOR AND TIP BACK ON HEEL, HAVE HER PLACE LIMB INSIDE TO HEAR AT LEAST ONE CLICK PRIOR TO STANDING.  WORK ON HAVING HER PUSH HER KNEE DOWN INTO SOCKET.   4:  CAN PLACE SMALL DOT OF BABY/MINERAL OIL ON KNEE CAP AND RUB UPWARD TO PREVENT FRICTION RUBBING FROM LINER.   5: BEGIN WEARING PROSTHESIS 2 HOURS ON THEN DO 2 HOURS OFF, THEN 2 HOURS ON (CAN DO 2-3 TIMES A DAY LIKE THIS).  AFTER A WEEK, IF SKIN STILL LOOKS GOOD/HEALING, INCREASE TO 3 HOURS ON, 2 HOURS OFF, 3 HOURS ON.  (CAN DO 2-3 TIMES PER DAY AS WELL).

## 2022-08-01 ENCOUNTER — Other Ambulatory Visit: Payer: Self-pay | Admitting: Internal Medicine

## 2022-08-01 DIAGNOSIS — M810 Age-related osteoporosis without current pathological fracture: Secondary | ICD-10-CM

## 2022-08-04 ENCOUNTER — Ambulatory Visit
Admission: RE | Admit: 2022-08-04 | Discharge: 2022-08-04 | Disposition: A | Discharge status: Home / Self Care | Payer: Medicare Other | Source: Ambulatory Visit | Attending: Internal Medicine | Admitting: Internal Medicine

## 2022-08-04 DIAGNOSIS — M810 Age-related osteoporosis without current pathological fracture: Secondary | ICD-10-CM

## 2022-08-05 ENCOUNTER — Encounter: Payer: Self-pay | Admitting: Rehabilitation

## 2022-08-05 ENCOUNTER — Ambulatory Visit: Payer: Medicare Other | Admitting: Rehabilitation

## 2022-08-05 DIAGNOSIS — R293 Abnormal posture: Secondary | ICD-10-CM

## 2022-08-05 DIAGNOSIS — R2689 Other abnormalities of gait and mobility: Secondary | ICD-10-CM

## 2022-08-05 DIAGNOSIS — M6281 Muscle weakness (generalized): Secondary | ICD-10-CM

## 2022-08-05 DIAGNOSIS — R2681 Unsteadiness on feet: Secondary | ICD-10-CM

## 2022-08-05 NOTE — Therapy (Signed)
OUTPATIENT PHYSICAL THERAPY TREATMENT NOTE   Patient Name: Caitlin Sharp MRN: [id number] DOB:1950-12-03, 71 y.o., female Today's Date: 08/05/2022  PCP: Veleta Miners, MD REFERRING PROVIDER: Mechele Claude, PA-C  END OF SESSION:   PT End of Session - 08/05/22 1142     Visit Number 7    Number of Visits 17    Date for PT Re-Evaluation 09/07/22    Authorization Type [employer] Medicare (10th visit PN needed)    PT Start Time 1017    PT Stop Time 1104    PT Time Calculation (min) 47 min    Equipment Utilized During Treatment Gait belt    Activity Tolerance Patient tolerated treatment well;Other (comment)   Son is concerned about pt blood sugar as she nods off during session several times briefly.  Pt has removed sensor so unable to check blood sugar.   Behavior During Therapy WFL for tasks assessed/performed               Past Medical History:  Diagnosis Date   Abnormal thyroid blood test    Per Records recevied from [facility], Dr.Pearson   Acute deep vein thrombosis of right peroneal vein ([place]) 10/05/2020   RLE DVT   Anxiety    Arthritis    RA   Coronary artery disease    Diabetes type I [facility])    Per [facility] New Patient Packet   Diabetic retinopathy ([place])    Per [facility] New Patient Packet   DKA (diabetic ketoacidosis) ([place]) 2021   Dr.William Zackowski.  Insulin Pump was out of Insulin   Glaucoma    Per [place] Patient Packet   H/O heart bypass surgery 2012   Dr. Luana Shu, Per [facility] New Patient Packet   Heart murmur    when I was a teenager   History of blood transfusion    History of kidney stones    passed   Hx of CABG    Per records received form [facility], Dr.Pearson    Left bundle branch block    Mild mitral regurgitation    Per records from [facility], [place]    Neuropathy 1976   Per [place] Patient Packet   NSTEMI (non-ST elevated myocardial infarction) ([place]) 09/21/2020   NSTEMI/demand ischemic in setting of DKA, patent grafts by 09/24/20 [facility], continue medical  therapy   Osteoporosis    T Score -2.7 , Per [place] New Patient Packet   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 due to underlying condition [facility])    Per Records recevied from [facility], Dr.Pearson   PVD (peripheral vascular disease) ([place]) 10/27/2021   Type 1 diabetes mellitus with hyperglycemia ([place]) 10/28/2021   Past Surgical History:  Procedure Laterality Date   ABDOMINAL AORTOGRAM W/LOWER EXTREMITY N/A 10/02/2021   Procedure: ABDOMINAL AORTOGRAM W/LOWER EXTREMITY;  Surgeon: Broadus John, MD;  Location: [place] CV LAB;  Service: Cardiovascular;  Laterality: N/A;   AMPUTATION Right 04/17/2021   Procedure: RIGHT GREAT TOE AMPUTATION;  Surgeon: Newt Minion, MD;  Location: [place];  Service: Orthopedics;  Laterality: Right;   AMPUTATION Right 07/19/2021   Procedure: RIGHT FOOT 1ST RAY AMPUTATION;  Surgeon: Newt Minion, MD;  Location: [place];  Service: Orthopedics;  Laterality: Right;   AMPUTATION Right 02/06/2022   Procedure: AMPUTATION BELOW KNEE;  Surgeon: Wylene Simmer, MD;  Location: [place];  Service: Orthopedics;  Laterality: Right;  regional block   CESAREAN SECTION  1970   Dr. Eddie Dibbles  [place], Per [facility] New Patient Packet   CESAREAN SECTION  1973   Dr. 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) 07/29/2022   Ischemic cardiomyopathy 07/29/2022   Moderate episode of recurrent major depressive disorder (HCC) 07/29/2022   Anemia of chronic disease 07/29/2022   S/P BKA (below knee amputation) unilateral, right (HCC) 07/29/2022   Daytime sleepiness 07/29/2022   Cognitive impairment 07/29/2022   Osteomyelitis (HCC) 02/06/2022   Osteomyelitis of right foot (HCC) 02/06/2022   Type 1 diabetes mellitus with hyperglycemia (HCC) 10/28/2021   PVD (peripheral vascular disease) (HCC) 10/27/2021   Osteomyelitis of  great toe of right foot (HCC)    NSVT (nonsustained ventricular tachycardia) (HCC) 01/17/2021   Acute deep vein thrombosis (DVT) of right peroneal vein (HCC) 10/17/2020   Insulin long-term use (HCC) 07/18/2020   Bruit of left carotid artery 10/31/2019   Coronary artery disease involving native coronary artery of native heart without angina pectoris 10/31/2019   Open-angle glaucoma of right eye 10/31/2019   Dry eyes 03/01/2018   Presence of intraocular lens 10/18/2015   Cystoid macular edema 06/14/2012    REFERRING DIAG: Z89.511 (ICD-10-CM) - Acquired absence of right leg below knee Z47.89 (ICD-10-CM) - Encounter for prosthetic gait training    THERAPY DIAG:  Unsteadiness on feet  Abnormal posture  Other abnormalities of gait and mobility  Muscle weakness (generalized)  Rationale for Evaluation and Treatment Rehabilitation  PERTINENT HISTORY: Hx of BKA to the right leg due to osteomyelitis 02/06/22. Worked with therapy and then transferred to assisted living. She has been a Type 1 DM for over 50 years. Used to be on a insulin pump now on lantus and meal coverage.  Recently saw endocrinology and they lowered her lantus as she voiced concern for hypoglycemia. A1C 8.6 on 04/22/22 which is improved  She keeps getting skin tears from hitting objects such as her WC She is awaiting a prosthesis from [employer].  Was treated for thigh cellulitis due to an infected skin tear with Doxycycline on 04/29/22.  She reports the area is much improved. Now has a skin tear to the LLE that has some mild yellow drainage and redness per nursing staff that is concerning.   PRECAUTIONS: Fall  SUBJECTIVE:                                                                                                                                                                                      SUBJECTIVE STATEMENT:   Pt did not recall  this PT and other PT checking her skin or any part of session on Tuesday.  She is tearful  today reporting that the facility "tells her son and thinks that she has dementia."    PAIN:  Are you having pain? No   OBJECTIVE: (objective measures completed at initial evaluation unless otherwise dated)  TODAY'S TREATMENT:  CURRENT PROSTHETIC WEAR ASSESSMENT: Patient is independent with:  dependent with all aspects of prosthetic care Patient is dependent with: skin check, residual limb care, prosthetic cleaning, ply sock cleaning, correct ply sock adjustment, proper wear schedule/adjustment, proper weight-bearing schedule/adjustment Donning prosthesis: MaxA  Doffing prosthesis: Max A Prosthetic wear tolerance: Pt has been wearing prosthesis consistently 2 hrs, 2x/day since last Friday!!  Prosthetic weight bearing tolerance: approx 4 minutes at a time Edema: none noted Residual limb condition: both initial skin tears healing very well, no scab noted on more proximal area and only very small scab on more distal area. She did have new skin tear on upper limb, see below for details.  Prosthetic description: Pin lock suspension K code/activity level with prosthetic use: Level 3      PATIENT EDUCATED ON FOLLOWING PROSTHETIC CARE: Prosthetic wear tolerance: 4 hours/day, 7 days/week Prosthetic weight bearing tolerance: 2-3 minutes Other education  Skin check, Residual limb care, Care of non-amputated limb, Prosthetic cleaning, Ply sock cleaning, Correct ply sock adjustment, Propper donning, Proper doffing, Proper wear schedule/adjustment, and Proper weight-bearing schedule/adjustment    GAIT: Gait pattern: step to pattern, step through pattern, decreased stride length, lateral hip instability, trunk flexed, and narrow BOS Distance walked: 45' x 1, 115' x 1, 50' x 1 Assistive device utilized: Environmental consultant - 2 wheeled Level of assistance: SBA and CGA Comments: Ambulated first distance with 4 ply total (note that 3 ply was cut off sock and she did not have her bag of extra socks with her)  with pain noted at knee cap and pt sitting too far in socket.  Clinic had an extra 5 ply therefore removed 4 and donned 5 ply.  This provided better fit during gait x 115'.  She could likely apply another 1 ply later today during second wear time, educated on this during session.  Note that we utilized walker with theraband around bottom and two targets to aim feet for to avoid adduction of prosthesis.  Also cues for upright posture and intermittent upward gaze.  Pt doing very well today.    Therex:  Initiated standing balance at counter top, see below for details on exercises.  Performed 2 laps of side stepping and 10 reps of each of the remaining exercises.        PATIENT EDUCATION:  Pt with small skin tear on upper leg (she states she thinks she bumped limb getting into w/c this morning).  She had applied bandaid as it was right before leaving to come to therapy.  PT removed and cleaned.  Still actively bleeding a mild amount but donned tegaderm (provided extra tegaderm for her at home).  Education to leave on until starts to peel and re apply if needed. Also educated to increase wear time to 3 hours on 2 hours off, 3 hours on, etc.  As she is tolerating 2 hour wear time well and wounds are healing well.  Pt verbalized understanding.    HOME EXERCISE PROGRAM:   Access Code: [email or web address] URL: [email or web address] Date: 08/05/2022 Prepared by: Cameron Sprang  Exercises - Side Stepping with Counter Support  - 1 x daily - 7 x  weekly - 2 sets - 10 reps - Standing Hip Abduction with Counter Support  - 1 x daily - 7 x weekly - 2 sets - 10 reps - Standing Thoracic and Cervical Rotation with Reach at Wall  - 1 x daily - 7 x weekly - 2 sets - 10 reps  INSTRUCTIONS FOR CNAS ON DONNING/DOFFING PROSTHESIS:   1:  WHEN WEARING PROSTHESIS, DON SNUG (SMALLER) BLACK CUT OFF SOCK FIRST UNDER SILICONE LINER.  HAVE HER SCOOT TO EDGE OF CHAIR.  THIS SHOULD BE DIRECTLY ON SKIN.  IF SHE IS HAVING  BLEEDING OR IT GETS SOILED, SWITCH TO HER OTHER BLACK CUT OFF SOCK.  WHEN NOT WEARING PROSTHESIS, WEAR BLACK CUT OFF SOCK THAT IS LARGER WITH GRAY SHRINKER WITH RING.    2: INVERT LINER AND ENSURE THAT END IS AS FLAT AS POSSIBLE AND ENSURE PIN IS IN LINE WITH BONE.     3: WHEN DONNING WHITE SOCKS, HAVE HER TRY AND BUNDLE UP AND PLACE MIDDLE FINGERS IN HOLE SO THAT SHE KNOWS WHERE HOLE IS WHEN PUTTING ON.     3: PLACE PROSTHESIS ON FLOOR AND TIP BACK ON HEEL, HAVE HER PLACE LIMB INSIDE TO HEAR AT LEAST ONE CLICK PRIOR TO STANDING.  WORK ON HAVING HER PUSH HER KNEE DOWN INTO SOCKET.    4:  CAN PLACE SMALL DOT OF BABY/MINERAL OIL ON KNEE CAP AND RUB UPWARD TO PREVENT FRICTION RUBBING FROM LINER.    5: BEGIN WEARING PROSTHESIS 2 HOURS ON THEN DO 2 HOURS OFF, THEN 2 HOURS ON (CAN DO 2-3 TIMES A DAY LIKE THIS).  AFTER A WEEK, IF SKIN STILL LOOKS GOOD/HEALING, INCREASE TO 3 HOURS ON, 2 HOURS OFF, 3 HOURS ON.  (CAN DO 2-3 TIMES PER DAY AS WELL).    ASSESSMENT:   CLINICAL IMPRESSION: Skilled session continues to focus on prosthetic education with wear, donning/doffing, and sock ply adjustment.  We were also able to continue with gait training and standing exercises today.  She tolerated all very well.    OBJECTIVE IMPAIRMENTS Abnormal gait, decreased activity tolerance, decreased balance, decreased endurance, decreased knowledge of use of DME, decreased mobility, difficulty walking, decreased ROM, decreased strength, impaired perceived functional ability, impaired flexibility, postural dysfunction, and prosthetic dependency .    ACTIVITY LIMITATIONS carrying, lifting, bending, standing, squatting, stairs, transfers, bathing, toileting, dressing, reach over head, hygiene/grooming, and locomotion level   PARTICIPATION LIMITATIONS: cleaning, laundry, driving, shopping, and community activity   PERSONAL FACTORS 3+ comorbidities: see above  are also affecting patient's functional outcome.    REHAB  POTENTIAL: Good   CLINICAL DECISION MAKING: Evolving/moderate complexity   EVALUATION COMPLEXITY: Moderate     GOALS: Goals reviewed with patient? Yes   SHORT TERM GOALS: Target date: 08/06/2022     Pt/caregiver will be IND with initial HEP in order to indicate improved functional mobility and dec fall risk. Baseline: Goal status: INITIAL   2.  Pt will be able to don/doff prosthesis at min A level and tolerate 6 hours of wear time per day without skin break down. Baseline:  Goal status: INITIAL   3.  Pt will tolerate standing x 5 mins with intermittent support at S level in order to indicate improved independence with ADLs.  Baseline:  Goal status: INITIAL   4.  Will assess BERG balance when able and set LTG to reflect decreased fall risk.  Baseline:  Goal status: INITIAL   5.  Will assess gait speed and set LTG to reflect progress.  Baseline:  Goal status: INITIAL   6.  Pt will ambulate x 100' with RW and prosthesis at min A level in order to indicate improved household mobility.  Baseline:  Goal status: INITIAL   LONG TERM GOALS: Target date: 09/07/2022     Pt will be IND with final HEP in order to indicate improved functional mobility and dec fall risk. Baseline:  Goal status: INITIAL   2.  Pt will ambulate x 150' indoors and 300' outdoors over unlevel paved surfaces with RW at S level in order to indicate improved household and community mobility.  Baseline:  Goal status: INITIAL   3.  Pt will negotiate up/down curb/ramp with RW and 4 steps with rails at S level in order to indicate improved community access.   Baseline:  Goal status: INITIAL   4.  Pt will tolerate standing x 10 mins with intermittent UE support while performing ADLs in order to indicate improved household independence.  Baseline:  Goal status: INITIAL   5.  Will add BERG goal upon assessment.  Baseline:  Goal status: INITIAL   6.  Will add gait speed goal upon assessment  Baseline:   Goal status: INITIAL   7.  Pt will don/doff prosthesis at S level and tolerate wearing prosthesis >/=90% of awake time in order to indicate more independent prosthetic care.   Baseline:  Goal status: INITIAL     PLAN: PT FREQUENCY: 2x/week   PT DURATION: 8 weeks   PLANNED INTERVENTIONS: Therapeutic exercises, Therapeutic activity, Neuromuscular re-education, Balance training, Gait training, Patient/Family education, Self Care, Joint mobilization, Stair training, Vestibular training, Prosthetic training, DME instructions, and Manual therapy   PLAN FOR NEXT SESSION: STGS!! How is wear time going? Is she up to 3 hours 2-3times per day? I recommended WB no more than 4-5 mins at a time before resting several minutes.  Continue to check skin.  Continue prosthetic education.  Prosthetic gait.  have her work on donning/doffing prosthesis.  Add to standing exercises.    Cameron Sprang, PT, MPT [facility] [address] [place], [place], [address] Phone: [phone]   Fax:  [phone] 08/05/22, 11:45 AM

## 2022-08-07 ENCOUNTER — Telehealth: Payer: Self-pay | Admitting: Internal Medicine

## 2022-08-07 ENCOUNTER — Encounter: Payer: Self-pay | Admitting: Rehabilitation

## 2022-08-07 ENCOUNTER — Ambulatory Visit: Payer: Medicare Other | Admitting: Rehabilitation

## 2022-08-07 DIAGNOSIS — R2681 Unsteadiness on feet: Secondary | ICD-10-CM

## 2022-08-07 DIAGNOSIS — R2689 Other abnormalities of gait and mobility: Secondary | ICD-10-CM

## 2022-08-07 DIAGNOSIS — M6281 Muscle weakness (generalized): Secondary | ICD-10-CM

## 2022-08-07 DIAGNOSIS — R293 Abnormal posture: Secondary | ICD-10-CM

## 2022-08-07 NOTE — Telephone Encounter (Signed)
Lauren, RN from [employer] is calling to schedule patient for appointment. Please call [facility] RN back to schedule stated Candie will be coming onto shift their direct number is [phone]. Thank you

## 2022-08-07 NOTE — Therapy (Signed)
OUTPATIENT PHYSICAL THERAPY TREATMENT NOTE   Patient Name: Caitlin Sharp MRN: [id number] DOB:03/20/1951, 71 y.o., female Today's Date: 08/07/2022  PCP: Veleta Miners, MD REFERRING PROVIDER: Mechele Claude, PA-C  END OF SESSION:   PT End of Session - 08/07/22 1019     Visit Number 8    Number of Visits 17    Date for PT Re-Evaluation 09/07/22    Authorization Type [employer] Medicare (10th visit PN needed)    PT Start Time 1017    PT Stop Time 1100    PT Time Calculation (min) 43 min    Equipment Utilized During Treatment Gait belt    Activity Tolerance Patient tolerated treatment well;Other (comment)   Son is concerned about pt blood sugar as she nods off during session several times briefly.  Pt has removed sensor so unable to check blood sugar.   Behavior During Therapy WFL for tasks assessed/performed               Past Medical History:  Diagnosis Date   Abnormal thyroid blood test    Per Records recevied from [facility], Dr.Pearson   Acute deep vein thrombosis of right peroneal vein ([place]) 10/05/2020   RLE DVT   Anxiety    Arthritis    RA   Coronary artery disease    Diabetes type I [facility])    Per [facility] New Patient Packet   Diabetic retinopathy ([place])    Per [facility] New Patient Packet   DKA (diabetic ketoacidosis) ([place]) 2021   Dr.William Zackowski.  Insulin Pump was out of Insulin   Glaucoma    Per [place] Patient Packet   H/O heart bypass surgery 2012   Dr. Luana Shu, Per [facility] New Patient Packet   Heart murmur    when I was a teenager   History of blood transfusion    History of kidney stones    passed   Hx of CABG    Per records received form [facility], Dr.Pearson    Left bundle branch block    Mild mitral regurgitation    Per records from [facility] [place]    Neuropathy 1976   Per [place] Patient Packet   NSTEMI (non-ST elevated myocardial infarction) ([place]) 09/21/2020   NSTEMI/demand ischemic in setting of DKA, patent grafts by 09/24/20 [facility], continue medical  therapy   Osteoporosis    T Score -2.7 , Per [place] New Patient Packet   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 due to underlying condition [facility])    Per Records recevied from [facility], Dr.Pearson   PVD (peripheral vascular disease) ([place]) 10/27/2021   Type 1 diabetes mellitus with hyperglycemia ([place]) 10/28/2021   Past Surgical History:  Procedure Laterality Date   ABDOMINAL AORTOGRAM W/LOWER EXTREMITY N/A 10/02/2021   Procedure: ABDOMINAL AORTOGRAM W/LOWER EXTREMITY;  Surgeon: Broadus John, MD;  Location: [place] CV LAB;  Service: Cardiovascular;  Laterality: N/A;   AMPUTATION Right 04/17/2021   Procedure: RIGHT GREAT TOE AMPUTATION;  Surgeon: Newt Minion, MD;  Location: [place];  Service: Orthopedics;  Laterality: Right;   AMPUTATION Right 07/19/2021   Procedure: RIGHT FOOT 1ST RAY AMPUTATION;  Surgeon: Newt Minion, MD;  Location: [place];  Service: Orthopedics;  Laterality: Right;   AMPUTATION Right 02/06/2022   Procedure: AMPUTATION BELOW KNEE;  Surgeon: Wylene Simmer, MD;  Location: [place];  Service: Orthopedics;  Laterality: Right;  regional block   CESAREAN SECTION  1970   Dr. Eddie Dibbles  Marica Otter, Per [facility] New Patient Packet   CESAREAN SECTION  1973   Dr. 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 ([place]) 07/29/2022   Ischemic cardiomyopathy 07/29/2022   Moderate episode of recurrent major depressive disorder ([place]) 07/29/2022   Anemia of chronic disease 07/29/2022   S/P BKA (below knee amputation) unilateral, right ([place]) 07/29/2022   Daytime sleepiness 07/29/2022   Cognitive impairment 07/29/2022   Osteomyelitis ([place]) 02/06/2022   Osteomyelitis of right foot ([place]) 02/06/2022   Type 1 diabetes mellitus with hyperglycemia ([place]) 10/28/2021   PVD (peripheral vascular disease) ([place]) 10/27/2021   Osteomyelitis of  great toe of right foot ([facility])    NSVT (nonsustained ventricular tachycardia) ([place]) 01/17/2021   Acute deep vein thrombosis (DVT) of right peroneal vein ([place]) 10/17/2020   Insulin long-term use ([place]) 07/18/2020   Bruit of left carotid artery 10/31/2019   Coronary artery disease involving native coronary artery of native heart without angina pectoris 10/31/2019   Open-angle glaucoma of right eye 10/31/2019   Dry eyes 03/01/2018   Presence of intraocular lens 10/18/2015   Cystoid macular edema 06/14/2012    REFERRING DIAG: Z89.511 (ICD-10-CM) - Acquired absence of right leg below knee Z47.89 (ICD-10-CM) - Encounter for prosthetic gait training    THERAPY DIAG:  Unsteadiness on feet  Abnormal posture  Other abnormalities of gait and mobility  Muscle weakness (generalized)  Rationale for Evaluation and Treatment Rehabilitation  PERTINENT HISTORY: Hx of BKA to the right leg due to osteomyelitis 02/06/22. Worked with therapy and then transferred to assisted living. She has been a Type 1 DM for over 50 years. Used to be on a insulin pump now on lantus and meal coverage.  Recently saw endocrinology and they lowered her lantus as she voiced concern for hypoglycemia. A1C 8.6 on 04/22/22 which is improved  She keeps getting skin tears from hitting objects such as her WC She is awaiting a prosthesis from [employer].  Was treated for thigh cellulitis due to an infected skin tear with Doxycycline on 04/29/22.  She reports the area is much improved. Now has a skin tear to the LLE that has some mild yellow drainage and redness per nursing staff that is concerning.   PRECAUTIONS: Fall  SUBJECTIVE:                                                                                                                                                                                      SUBJECTIVE STATEMENT:   Pt doing well, present  with leg donned and leg rest on w/c today.  Son accompanying her.    PAIN:  Are you  having pain? No   OBJECTIVE: (objective measures completed at initial evaluation unless otherwise dated)  TODAY'S TREATMENT:  CURRENT PROSTHETIC WEAR ASSESSMENT: Patient is independent with:  She is now able to assist some with doffing leg, donning socks Patient is dependent with: skin check, residual limb care, prosthetic cleaning, ply sock cleaning, correct ply sock adjustment, proper wear schedule/adjustment, proper weight-bearing schedule/adjustment Donning prosthesis: Mod/maxA Doffing prosthesis: min A Prosthetic wear tolerance: Pt has been wearing prosthesis consistently 2 hrs on, 2 hours off  Prosthetic weight bearing tolerance: approx 4-5 minutes at a time Edema: none noted Residual limb condition: Previous skin tears are healed, one small skin tear from Tuesday still has tegaderm but seems to be healing.  Prosthetic description: Pin lock suspension K code/activity level with prosthetic use: Level 3      PATIENT EDUCATED ON FOLLOWING PROSTHETIC CARE: Prosthetic wear tolerance: 4 hours/day, 7 days/week Prosthetic weight bearing tolerance: 2-3 minutes Other education  Skin check, Residual limb care, Care of non-amputated limb, Prosthetic cleaning, Ply sock cleaning, Correct ply sock adjustment, [place]pper donning, Proper doffing, Proper wear schedule/adjustment, and Proper weight-bearing schedule/adjustment    GAIT: Gait pattern: step to pattern, step through pattern, decreased stride length, lateral hip instability, trunk flexed, and narrow BOS Distance walked: 115' x 2 reps, gait to/from //  bars (15' x 2) Assistive device utilized: Environmental consultant - 2 wheeled Level of assistance: SBA and CGA Comments: Pt present with 5ply today and initially this was appropriate however with first lap around track, note sinking further into socket, so donned another 1 ply sock.  Pt did well assisting sinking limb into socket.    In // bars had her perform step taps to 4" step for improved R lateral  weight shift and weight bearing through prosthesis.  She did very well with this, esp when cued to look at mirror for better feedback.    Therex:  Initiated standing balance at counter top, see below for details on exercises.  Performed 2 laps of side stepping and 10 reps of each of the remaining exercises.    NMR: initated BERG balance test.   [facility] PT Assessment - 08/07/22 1316       Standardized Balance Assessment   Standardized Balance Assessment Berg Balance Test      Berg Balance Test   Sit to Stand Able to stand  independently using hands    Standing Unsupported Able to stand 2 minutes with supervision    Sitting with Back Unsupported but Feet Supported on Floor or Stool Able to sit safely and securely 2 minutes    Stand to Sit Controls descent by using hands    Transfers Needs one person to assist                  PATIENT EDUCATION:  Continue with HEP and walking as able    HOME EXERCISE PROGRAM:   Access Code: [email or web address] URL: [email or web address] Date: 08/05/2022 Prepared by: Cameron Sprang  Exercises - Side Stepping with Counter Support  - 1 x daily - 7 x weekly - 2 sets - 10 reps - Standing Hip Abduction with Counter Support  - 1 x daily - 7 x weekly - 2 sets - 10 reps - Standing Thoracic and Cervical Rotation with Reach at [place]  - 1 x daily - 7 x weekly - 2 sets - 10 reps  INSTRUCTIONS FOR CNAS ON DONNING/DOFFING PROSTHESIS:   1:  WHEN WEARING PROSTHESIS, DON SNUG (SMALLER) BLACK CUT OFF SOCK FIRST UNDER SILICONE LINER.  HAVE HER SCOOT TO EDGE OF CHAIR.  THIS SHOULD BE DIRECTLY ON SKIN.  IF SHE IS HAVING BLEEDING OR IT GETS SOILED, SWITCH TO HER OTHER BLACK CUT OFF SOCK.  WHEN NOT WEARING PROSTHESIS, WEAR BLACK CUT OFF SOCK THAT IS LARGER WITH GRAY SHRINKER WITH RING.    2: INVERT LINER AND ENSURE THAT END IS AS FLAT AS POSSIBLE AND ENSURE PIN IS IN LINE WITH BONE.     3: WHEN DONNING WHITE SOCKS, HAVE HER TRY AND BUNDLE UP AND PLACE MIDDLE  FINGERS IN HOLE SO THAT SHE KNOWS WHERE HOLE IS WHEN PUTTING ON.     3: PLACE PROSTHESIS ON FLOOR AND TIP BACK ON HEEL, HAVE HER PLACE LIMB INSIDE TO HEAR AT LEAST ONE CLICK PRIOR TO STANDING.  WORK ON HAVING HER PUSH HER KNEE DOWN INTO SOCKET.    4:  CAN PLACE SMALL DOT OF BABY/MINERAL OIL ON KNEE CAP AND RUB UPWARD TO PREVENT FRICTION RUBBING FROM LINER.    5: BEGIN WEARING PROSTHESIS 2 HOURS ON THEN DO 2 HOURS OFF, THEN 2 HOURS ON (CAN DO 2-3 TIMES A DAY LIKE THIS).  AFTER A WEEK, IF SKIN STILL LOOKS GOOD/HEALING, INCREASE TO 3 HOURS ON, 2 HOURS OFF, 3 HOURS ON.  (CAN DO 2-3 TIMES PER DAY AS WELL).    ASSESSMENT:   CLINICAL IMPRESSION: Skilled session continues to focus on prosthetic education with wear, donning/doffing, and sock ply adjustment.  She is able to increase her gait distance/tolerance.  Initiated BERG balance for goal check.  See above.     OBJECTIVE IMPAIRMENTS Abnormal gait, decreased activity tolerance, decreased balance, decreased endurance, decreased knowledge of use of DME, decreased mobility, difficulty walking, decreased ROM, decreased strength, impaired perceived functional ability, impaired flexibility, postural dysfunction, and prosthetic dependency .    ACTIVITY LIMITATIONS carrying, lifting, bending, standing, squatting, stairs, transfers, bathing, toileting, dressing, reach over head, hygiene/grooming, and locomotion level   PARTICIPATION LIMITATIONS: cleaning, laundry, driving, shopping, and community activity   PERSONAL FACTORS 3+ comorbidities: see above  are also affecting patient's functional outcome.    REHAB POTENTIAL: Good   CLINICAL DECISION MAKING: Evolving/moderate complexity   EVALUATION COMPLEXITY: Moderate     GOALS: Goals reviewed with patient? Yes   SHORT TERM GOALS: Target date: 08/06/2022     Pt/caregiver will be IND with initial HEP in order to indicate improved functional mobility and dec fall risk. Baseline: Goal status: MET     2.  Pt will be able to don/doff prosthesis at min A level and tolerate 6 hours of wear time per day without skin break down. Baseline: mod/max A at times  Goal status: Progressing    3.  Pt will tolerate standing x 5 mins with intermittent support at S level in order to indicate improved independence with ADLs.  Baseline: Can stand up to 2 mins without support  Goal status: Progressing    4.  Will assess BERG balance when able and set LTG to reflect decreased fall risk.  Baseline:  Goal status: INITIAL   5.  Will assess gait speed and set LTG to reflect progress.  Baseline:  Goal status: INITIAL   6.  Pt will ambulate x 100' with RW and prosthesis at min A level in order to indicate improved household mobility.  Baseline: 115' Goal status: MET    LONG  TERM GOALS: Target date: 09/07/2022     Pt will be IND with final HEP in order to indicate improved functional mobility and dec fall risk. Baseline:  Goal status: INITIAL   2.  Pt will ambulate x 150' indoors and 300' outdoors over unlevel paved surfaces with RW at S level in order to indicate improved household and community mobility.  Baseline:  Goal status: INITIAL   3.  Pt will negotiate up/down curb/ramp with RW and 4 steps with rails at S level in order to indicate improved community access.   Baseline:  Goal status: INITIAL   4.  Pt will tolerate standing x 10 mins with intermittent UE support while performing ADLs in order to indicate improved household independence.  Baseline:  Goal status: INITIAL   5.  Will add BERG goal upon assessment.  Baseline:  Goal status: INITIAL   6.  Will add gait speed goal upon assessment  Baseline:  Goal status: INITIAL   7.  Pt will don/doff prosthesis at S level and tolerate wearing prosthesis >/=90% of awake time in order to indicate more independent prosthetic care.   Baseline:  Goal status: INITIAL     PLAN: PT FREQUENCY: 2x/week   PT DURATION: 8 weeks   PLANNED  INTERVENTIONS: Therapeutic exercises, Therapeutic activity, Neuromuscular re-education, Balance training, Gait training, Patient/Family education, Self Care, Joint mobilization, Stair training, Vestibular training, Prosthetic training, DME instructions, and Manual therapy   PLAN FOR NEXT SESSION: Remaining STGS-check her gait speed!! How is wear time going? Is she up to 3 hours 2-3times per day? I recommended WB no more than 4-5 mins at a time before resting several minutes.  Continue to check skin.  Continue prosthetic education.  Prosthetic gait.  have her work on donning/doffing prosthesis.  Add to standing exercises.    Cameron Sprang, PT, MPT [facility] [address] [place], [place], [address] Phone: [phone]   Fax:  [phone] 08/07/22, 1:09 PM

## 2022-08-09 LAB — BASIC METABOLIC PANEL
BUN: 25 — AB (ref 4–21)
CO2: 24 — AB (ref 13–22)
Chloride: 97 — AB (ref 99–108)
Glucose: 494
Potassium: 4.5 mEq/L (ref 3.5–5.1)
Sodium: 133 — AB (ref 137–147)

## 2022-08-09 LAB — COMPREHENSIVE METABOLIC PANEL
Calcium: 9.3 (ref 8.7–10.7)
eGFR: 76

## 2022-08-12 ENCOUNTER — Non-Acute Institutional Stay: Payer: Medicare Other | Admitting: Orthopedic Surgery

## 2022-08-12 ENCOUNTER — Ambulatory Visit: Payer: Medicare Other | Admitting: Physical Therapy

## 2022-08-12 ENCOUNTER — Encounter: Payer: Self-pay | Admitting: Physical Therapy

## 2022-08-12 ENCOUNTER — Encounter: Payer: Self-pay | Admitting: Orthopedic Surgery

## 2022-08-12 DIAGNOSIS — E1065 Type 1 diabetes mellitus with hyperglycemia: Secondary | ICD-10-CM

## 2022-08-12 DIAGNOSIS — R2681 Unsteadiness on feet: Secondary | ICD-10-CM

## 2022-08-12 DIAGNOSIS — R2689 Other abnormalities of gait and mobility: Secondary | ICD-10-CM

## 2022-08-12 DIAGNOSIS — S80812A Abrasion, left lower leg, initial encounter: Secondary | ICD-10-CM | POA: Diagnosis not present

## 2022-08-12 DIAGNOSIS — M6281 Muscle weakness (generalized): Secondary | ICD-10-CM

## 2022-08-12 DIAGNOSIS — R293 Abnormal posture: Secondary | ICD-10-CM

## 2022-08-12 NOTE — Therapy (Signed)
OUTPATIENT PHYSICAL THERAPY TREATMENT NOTE   Patient Name: Caitlin Sharp MRN: [id number] DOB:18-Apr-1951, 71 y.o., female Today's Date: 08/12/2022  PCP: Veleta Miners, MD REFERRING PROVIDER: Mechele Claude, PA-C  END OF SESSION:   PT End of Session - 08/12/22 1044     Visit Number 9    Number of Visits 17    Date for PT Re-Evaluation 09/07/22    Authorization Type [employer] Medicare (10th visit PN needed)    PT Start Time 1042   pt agreeable to be seen early   PT Stop Time 1132    PT Time Calculation (min) 50 min    Equipment Utilized During Treatment Gait belt    Activity Tolerance Patient tolerated treatment well    Behavior During Therapy WFL for tasks assessed/performed               Past Medical History:  Diagnosis Date   Abnormal thyroid blood test    Per Records recevied from [facility], Dr.Pearson   Acute deep vein thrombosis of right peroneal vein ([place]) 10/05/2020   RLE DVT   Anxiety    Arthritis    RA   Coronary artery disease    Diabetes type I ([place])    Per [facility] New Patient Packet   Diabetic retinopathy ([place]    Per [facility] New Patient Packet   DKA (diabetic ketoacidosis) ([place]) 2021   Dr.William Zackowski.  Insulin Pump was out of Insulin   Glaucoma    Per [place] Patient Packet   H/O heart bypass surgery 2012   Dr. Luana Shu, Per [facility] New Patient Packet   Heart murmur    when I was a teenager   History of blood transfusion    History of kidney stones    passed   Hx of CABG    Per records received form [facility], Dr.Pearson    Left bundle branch block    Mild mitral regurgitation    Per records from [facility], [place]    Neuropathy 1976   Per [place] Patient Packet   NSTEMI (non-ST elevated myocardial infarction) ([place]) 09/21/2020   NSTEMI/demand ischemic in setting of DKA, patent grafts by 09/24/20 [facility], continue medical therapy   Osteoporosis    T Score -2.7 , Per [place] New Patient Packet   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  with diabetes mellitus due to underlying condition [facility])    Per Records recevied from [facility], Dr.Pearson   PVD (peripheral vascular disease) ([place]) 10/27/2021   Type 1 diabetes mellitus with hyperglycemia ([place]) 10/28/2021   Past Surgical History:  Procedure Laterality Date   ABDOMINAL AORTOGRAM W/LOWER EXTREMITY N/A 10/02/2021   Procedure: ABDOMINAL AORTOGRAM W/LOWER EXTREMITY;  Surgeon: Broadus John, MD;  Location: [place] [facility];  Service: Cardiovascular;  Laterality: N/A;   AMPUTATION Right 04/17/2021   Procedure: RIGHT GREAT TOE AMPUTATION;  Surgeon: Newt Minion, MD;  Location: [place];  Service: Orthopedics;  Laterality: Right;   AMPUTATION Right 07/19/2021   Procedure: RIGHT FOOT 1ST RAY AMPUTATION;  Surgeon: Newt Minion, MD;  Location: [place];  Service: Orthopedics;  Laterality: Right;   AMPUTATION Right 02/06/2022   Procedure: AMPUTATION BELOW KNEE;  Surgeon: Wylene Simmer, MD;  Location: [place];  Service: Orthopedics;  Laterality: Right;  regional block   CESAREAN SECTION  1970   Dr. Moses Manners, Per [facility] New Patient Packet   CESAREAN SECTION  1973   Dr. 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 ([place]) 07/29/2022   Ischemic cardiomyopathy 07/29/2022   Moderate episode of recurrent major depressive disorder ([place]) 07/29/2022   Anemia of chronic disease 07/29/2022   S/P BKA (below knee amputation) unilateral, right ([place]) 07/29/2022   Daytime sleepiness 07/29/2022   Cognitive impairment 07/29/2022   Osteomyelitis ([place]) 02/06/2022   Osteomyelitis of right foot ([place]) 02/06/2022   Type 1 diabetes mellitus with hyperglycemia ([place]) 10/28/2021   PVD (peripheral vascular disease) ([place]) 10/27/2021   Osteomyelitis of great toe of right foot ([facility])    NSVT (nonsustained ventricular tachycardia) ([place]) 01/17/2021   Acute deep vein thrombosis (DVT) of  right peroneal vein ([place]) 10/17/2020   Insulin long-term use ([place]) 07/18/2020   Bruit of left carotid artery 10/31/2019   Coronary artery disease involving native coronary artery of native heart without angina pectoris 10/31/2019   Open-angle glaucoma of right eye 10/31/2019   Dry eyes 03/01/2018   Presence of intraocular lens 10/18/2015   Cystoid macular edema 06/14/2012    REFERRING DIAG: Z89.511 (ICD-10-CM) - Acquired absence of right leg below knee Z47.89 (ICD-10-CM) - Encounter for prosthetic gait training    THERAPY DIAG:  Unsteadiness on feet  Abnormal posture  Other abnormalities of gait and mobility  Muscle weakness (generalized)  Rationale for Evaluation and Treatment Rehabilitation  PERTINENT HISTORY: Hx of BKA to the right leg due to osteomyelitis 02/06/22. Worked with therapy and then transferred to assisted living. She has been a Type 1 DM for over 50 years. Used to be on a insulin pump now on lantus and meal coverage.  Recently saw endocrinology and they lowered her lantus as she voiced concern for hypoglycemia. A1C 8.6 on 04/22/22 which is improved  She keeps getting skin tears from hitting objects such as her WC She is awaiting a prosthesis from [facility].  Was treated for thigh cellulitis due to an infected skin tear with Doxycycline on 04/29/22.  She reports the area is much improved. Now has a skin tear to the LLE that has some mild yellow drainage and redness per nursing staff that is concerning.   PRECAUTIONS: Fall  SUBJECTIVE:                                                                                                                                                                                      SUBJECTIVE STATEMENT:   Pt reports she is doing well other than being wet from the rain, present with leg donned and leg rest on w/c today.  Granddaughter Delcie Roch present accompanying  her.    PAIN:  Are you having pain? No   OBJECTIVE: (objective measures  completed at initial evaluation unless otherwise dated)  TODAY'S TREATMENT:  CURRENT PROSTHETIC WEAR ASSESSMENT: Patient is independent with:  She is now able to assist some with doffing leg, donning socks; she continues to have difficulty w/ pressing the button at the distal end of the socket and pushing the socket off. Patient is dependent with: skin check, residual limb care, prosthetic cleaning, ply sock cleaning, correct ply sock adjustment, proper wear schedule/adjustment, proper weight-bearing schedule/adjustment Donning prosthesis: Mod/maxA Doffing prosthesis: min A Prosthetic wear tolerance: (Endorses same from prior visit11/21) Pt has been wearing prosthesis consistently 2 hrs on, 2 hours off; granddaughter states the prosthesis sometimes is worn closer to 3-4 hours due to pt schedule changes for appts, etc.  PT confirms that this is okay and what we would like to see in terms of tolerance so long as the skin remains intact and dry. Prosthetic weight bearing tolerance: approx 4-5 minutes at a time Edema: none noted Residual limb condition: Skin is intact today w/ evidence of small medial skin tear healing uncovered.  Skin on most distal-lateral portion of limb appears dry and mildly flaky. Prosthetic description: Pin lock suspension K code/activity level with prosthetic use: Level 3  PT doffs prosthetic to assess layering as there appeared to be no socks on the outside of the liner today.  Granddaughter assisted w/ donning prosthesis today, her second attempt, she requests feedback from therapist.  Provided edu and demonstration of layering and reviewed edu provided by therapist prior (see HEP section).  When doffed 3 ply half sock and 1 ply whole sock layered over half black vivewear sock under liner, moderate patellar redness noted.  Explained correction to pt and granddaughter.  Attempted first stand w/ correction of layering using 4 ply socks.  Pt reporting the leg felt "spongy" at the  bottom of the socket so therapist adjusted 1 ply whole sock to 3 ply whole sock w/ 3 ply half sock underneath to prevent edges rolling.  Pt reported this switch felt more stable, no signs of sinking in socket.    GAIT: Gait pattern: step to pattern, step through pattern, decreased stride length, lateral hip instability, trunk flexed, and narrow BOS Distance walked: 28' + 33' x2 (10MWT and return to wheelchair) Assistive device utilized: Environmental consultant - 2 wheeled Level of assistance: SBA and CGA Comments: Intermittent scuffing of prosthetic toe on floor and decreased heel strike.  No excessive sinking in socket noted during ambulation, but pt reported mild patellar pressure at end of lap.  Provided external cues to improve step size and BOS.   [facility] PT Assessment - 08/12/22 1110       Berg Balance Test   Sit to Stand Able to stand  independently using hands    Standing Unsupported Able to stand 2 minutes with supervision    Sitting with Back Unsupported but Feet Supported on Floor or Stool Able to sit safely and securely 2 minutes    Stand to Sit Controls descent by using hands    Transfers Needs one person to assist    Standing Unsupported with Eyes Closed Able to stand 10 seconds with supervision    Standing Unsupported with Feet Together Able to place feet together independently but unable to hold for 30 seconds   21.88sec   From Standing, Reach Forward with Outstretched Arm Reaches forward but needs supervision    From Standing Position, Pick up  Object from Floor Unable to pick up shoe, but reaches 2-5 cm (1-2") from shoe and balances independently    From Standing Position, Turn to Look Behind Over each Shoulder Turn sideways only but maintains balance    Turn 360 Degrees Needs assistance while turning    Standing Unsupported, Alternately Place Feet on Step/Stool Able to complete >2 steps/needs minimal assist    Standing Unsupported, One Foot in Front Able to take small step independently and  hold 30 seconds   inc sway and right trunk rotation when RLE in rear   Standing on One Leg Unable to try or needs assist to prevent fall   repeated trials w/ posterior LOB when putting leg back on ground, minA for recovery   Total Score 27    Berg comment: 27/56; high fall risk            10MWT w/ RW:  25.59 sec = 0.39 m/sec OR 1.29 ft/sec  PATIENT EDUCATED ON FOLLOWING PROSTHETIC CARE: Prosthetic wear tolerance: 4 hours/day, 7 days/week; Discussed progressing towards 6 hours per day Prosthetic weight bearing tolerance: 2-3 minutes; Re-iterated adjusting tolerance to 4-5 minutes before several minutes of rest Other education  Skin check, Residual limb care, Care of non-amputated limb, Prosthetic cleaning, Ply sock cleaning, Correct ply sock adjustment, [place]pper donning, Proper doffing, Proper wear schedule/adjustment, and Proper weight-bearing schedule/adjustment   PATIENT EDUCATION:  Continue with HEP and walking as able   HOME EXERCISE PROGRAM:  Access Code: [email or web address] URL: [email or web address] Date: 08/05/2022 Prepared by: Cameron Sprang  Exercises - Side Stepping with Counter Support  - 1 x daily - 7 x weekly - 2 sets - 10 reps - Standing Hip Abduction with Counter Support  - 1 x daily - 7 x weekly - 2 sets - 10 reps - Standing Thoracic and Cervical Rotation with Reach at [place]  - 1 x daily - 7 x weekly - 2 sets - 10 reps  INSTRUCTIONS FOR CNAS ON DONNING/DOFFING PROSTHESIS:   1:  WHEN WEARING PROSTHESIS, DON SNUG (SMALLER) BLACK CUT OFF SOCK FIRST UNDER SILICONE LINER.  HAVE HER SCOOT TO EDGE OF CHAIR.  THIS SHOULD BE DIRECTLY ON SKIN.  IF SHE IS HAVING BLEEDING OR IT GETS SOILED, SWITCH TO HER OTHER BLACK CUT OFF SOCK.  WHEN NOT WEARING PROSTHESIS, WEAR BLACK CUT OFF SOCK THAT IS LARGER WITH GRAY SHRINKER WITH RING.    2: INVERT LINER AND ENSURE THAT END IS AS FLAT AS POSSIBLE AND ENSURE PIN IS IN LINE WITH BONE.     3: WHEN DONNING WHITE SOCKS, HAVE HER TRY AND  BUNDLE UP AND PLACE MIDDLE FINGERS IN HOLE SO THAT SHE KNOWS WHERE HOLE IS WHEN PUTTING ON.     3: PLACE PROSTHESIS ON FLOOR AND TIP BACK ON HEEL, HAVE HER PLACE LIMB INSIDE TO HEAR AT LEAST ONE CLICK PRIOR TO STANDING.  WORK ON HAVING HER PUSH HER KNEE DOWN INTO SOCKET.    4:  CAN PLACE SMALL DOT OF BABY/MINERAL OIL ON KNEE CAP AND RUB UPWARD TO PREVENT FRICTION RUBBING FROM LINER.    5: BEGIN WEARING PROSTHESIS 2 HOURS ON THEN DO 2 HOURS OFF, THEN 2 HOURS ON (CAN DO 2-3 TIMES A DAY LIKE THIS).  AFTER A WEEK, IF SKIN STILL LOOKS GOOD/HEALING, INCREASE TO 3 HOURS ON, 2 HOURS OFF, 3 HOURS ON.  (CAN DO 2-3 TIMES PER DAY AS WELL).    ASSESSMENT:   CLINICAL IMPRESSION: Proceeded with prosthetic education including granddaughter this session to  promote improved caregiver support and safety for patient.  Further assessed STGs w/ pt scoring 27/56 on BERG assessment and ambulating with a reduced speed of 1.29 ft/sec, both indicating an elevated fall risk.  Will continue to address balance and ambulatory deficits noted in ongoing POC.   OBJECTIVE IMPAIRMENTS Abnormal gait, decreased activity tolerance, decreased balance, decreased endurance, decreased knowledge of use of DME, decreased mobility, difficulty walking, decreased ROM, decreased strength, impaired perceived functional ability, impaired flexibility, postural dysfunction, and prosthetic dependency .    ACTIVITY LIMITATIONS carrying, lifting, bending, standing, squatting, stairs, transfers, bathing, toileting, dressing, reach over head, hygiene/grooming, and locomotion level   PARTICIPATION LIMITATIONS: cleaning, laundry, driving, shopping, and community activity   PERSONAL FACTORS 3+ comorbidities: see above  are also affecting patient's functional outcome.    REHAB POTENTIAL: Good   CLINICAL DECISION MAKING: Evolving/moderate complexity   EVALUATION COMPLEXITY: Moderate     GOALS: Goals reviewed with patient? Yes   SHORT TERM GOALS:  Target date: 08/06/2022     Pt/caregiver will be IND with initial HEP in order to indicate improved functional mobility and dec fall risk. Baseline: Goal status: MET    2.  Pt will be able to don/doff prosthesis at min A level and tolerate 6 hours of wear time per day without skin break down. Baseline: mod/max A at times  Goal status: Progressing    3.  Pt will tolerate standing x 5 mins with intermittent support at S level in order to indicate improved independence with ADLs.  Baseline: Can stand up to 2 mins without support  Goal status: Progressing    4.  Will assess BERG balance when able and set LTG to reflect decreased fall risk.  Baseline: (08/12/2022) 27/56 Goal status: MET   5.  Will assess gait speed and set LTG to reflect progress.  Baseline: 1.29 ft/sec (08/12/2022) Goal status: MET   6.  Pt will ambulate x 100' with RW and prosthesis at min A level in order to indicate improved household mobility.  Baseline: 115' Goal status: MET    LONG TERM GOALS: Target date: 09/07/2022     Pt will be IND with final HEP in order to indicate improved functional mobility and dec fall risk. Baseline:  Goal status: INITIAL   2.  Pt will ambulate x 150' indoors and 300' outdoors over unlevel paved surfaces with RW at S level in order to indicate improved household and community mobility.  Baseline:  Goal status: INITIAL   3.  Pt will negotiate up/down curb/ramp with RW and 4 steps with rails at S level in order to indicate improved community access.   Baseline:  Goal status: INITIAL   4.  Pt will tolerate standing x 10 mins with intermittent UE support while performing ADLs in order to indicate improved household independence.  Baseline:  Goal status: INITIAL   5.  Pt will increase BERG balance score to >/=40/56 to demonstrate improved static balance.  Baseline: 27/56 (11/21) Goal status: INITIAL   6.  Pt will demonstrate a gait speed of >/=2.0 feet/sec in order to decrease  risk for falls. Baseline: 1.29 ft/sec w/ RW (11/21) Goal status: INITIAL   7.  Pt will don/doff prosthesis at S level and tolerate wearing prosthesis >/=90% of awake time in order to indicate more independent prosthetic care.   Baseline:  Goal status: INITIAL     PLAN: PT FREQUENCY: 2x/week   PT DURATION: 8 weeks   PLANNED INTERVENTIONS: Therapeutic exercises, Therapeutic activity,  Neuromuscular re-education, Balance training, Gait training, Patient/Family education, Self Care, Joint mobilization, Stair training, Vestibular training, Prosthetic training, DME instructions, and Manual therapy   PLAN FOR NEXT SESSION: Remaining STGS-check her gait speed!! How is wear time going? Is she up to 3 hours 2-3times per day? I recommended WB no more than 4-5 mins at a time before resting several minutes.  Continue to check skin.  Continue prosthetic education.  Prosthetic gait.  have her work on donning/doffing prosthesis.  Add to standing exercises.    Elease Etienne, PT, [place] [address] [place], [place], [address] Phone: [phone]   Fax:  [phone] 08/12/22, 4:02 PM

## 2022-08-12 NOTE — Progress Notes (Signed)
Location:  [place] Room Number: [phone] Place of Service:  ALF [phone]) Provider:  Windell Moulding, NP   Patient Care Team: Caitlin Dad, MD as PCP - General (Internal Medicine) Caitlin Morelle, MD as Referring Physician (Ophthalmology) Caitlin Sharp, Gayleen Orem, MD as Referring Physician (Cardiology) Caitlin Grove, MD as Referring Physician (Cardiology)  Extended Emergency Contact Information Primary Emergency Contact: Caitlin Sharp Address: [address]          [place],  [address] Caitlin Sharp of [place] Phone: [phone] Mobile Phone: [phone] Relation: Son Preferred language: English Interpreter needed? No Secondary Emergency Contact: [name],[name] Mobile Phone: [phone] Relation: Relative  Code Status:  Full Code Goals of care: Advanced Directive information    08/12/2022   11:56 AM  Advanced Directives  Does Patient Have a Medical Advance Directive? Yes  Type of Paramedic of [place];Living will;Out of facility DNR (pink MOST or yellow form)  Does patient want to make changes to medical advance directive? No - Patient declined  Copy of [place] in Chart? Yes - validated most recent copy scanned in chart (See row information)     Chief Complaint  Patient presents with   Acute Visit    Hyperglycemia   Quality Metric Gaps    To Discuss the following as listed to the Care Gaps as listed below.     HPI:  Pt is a 71 y.o. female seen today for an acute visit for left ankle skin abrasion.   She currently resides on the assisted living unit at [facility] PMH: DVT, CAD, cardiomyopathy, PVD, T1DM, osteomyelitis, R BKA, anemia, depression, and unstable gait.   Nursing reports small abrasion to left inner ankle. She reports accidentally rubbing up against wheelchair. She denies pain, swelling or purulent drainage. Afebrile. Nursing has been covering it with gauze prn.   11/17 she  her sugars were unreadable and indicated "high" reading. She was noted to have a 494 blood sugar earlier in the day. She was asymptomatic. Reports Freestyle Libre sensor was not working correctly. Stat bmp was ordered> 494. Blood sugars have since lowered. No recent hypoglycemias. She remains on Lantus and SSI.   Recent sugars:  11/21- 195,332  11/20- 200, 211, 215, 268  11/19- 158, 221, 316, 159, 89   Past Medical History:  Diagnosis Date   Abnormal thyroid blood test    Per Records recevied from [facility], Dr.Pearson   Acute deep vein thrombosis of right peroneal vein ([place]) 10/05/2020   RLE DVT   Anxiety    Arthritis    RA   Coronary artery disease    Diabetes type I [facility])    Per [facility] New Patient Packet   Diabetic retinopathy ([place])    Per [facility] New Patient Packet   DKA (diabetic ketoacidosis) ([place]) 2021   Dr.William Zackowski.  Insulin Pump was out of Insulin   Glaucoma    Per [place] Patient Packet   H/O heart bypass surgery 2012   Dr. Luana Shu, Per [facility] New Patient Packet   Heart murmur    when I was a teenager   History of blood transfusion    History of kidney stones    passed   Hx of CABG    Per records received form [facility], Dr.Pearson    Left bundle branch block    Mild mitral regurgitation    Per records from [facility] [place]    Neuropathy 1976   Per [facility] New Patient  Packet   NSTEMI (non-ST elevated myocardial infarction) (HCC) 09/21/2020   NSTEMI/demand ischemic in setting of DKA, patent grafts by 09/24/20 [facility], continue medical therapy   Osteoporosis    T Score -2.7 , Per [facility] New Patient Packet   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 due to underlying condition [facility])    Per Records recevied from [facility], Dr.Pearson   PVD (peripheral vascular disease) (HCC) 10/27/2021   Type 1 diabetes mellitus with hyperglycemia (HCC) 10/28/2021   Past Surgical History:  Procedure Laterality Date   ABDOMINAL AORTOGRAM W/LOWER  EXTREMITY N/A 10/02/2021   Procedure: ABDOMINAL AORTOGRAM W/LOWER EXTREMITY;  Surgeon: Victorino Sparrow, MD;  Location: [facility] INVASIVE CV LAB;  Service: Cardiovascular;  Laterality: N/A;   AMPUTATION Right 04/17/2021   Procedure: RIGHT GREAT TOE AMPUTATION;  Surgeon: [name], MD;  Location: [facility] OR;  Service: Orthopedics;  Laterality: Right;   AMPUTATION Right 07/19/2021   Procedure: RIGHT FOOT 1ST RAY AMPUTATION;  Surgeon: [name], MD;  Location: [facility] OR;  Service: Orthopedics;  Laterality: Right;   AMPUTATION Right 02/06/2022   Procedure: AMPUTATION BELOW KNEE;  Surgeon: Toni Arthurs, MD;  Location: [facility] OR;  Service: Orthopedics;  Laterality: Right;  regional block   CESAREAN SECTION  1970   Dr. Aquilla Hacker, Per [facility] New Patient Packet   CESAREAN SECTION  1973   Dr. Aquilla Hacker, Per [facility] New Patient Packet   CORONARY ARTERY BYPASS GRAFT      Allergies  Allergen Reactions   Penicillins Anaphylaxis    Outpatient Encounter Medications as of 08/12/2022  Medication Sig   acetaminophen (TYLENOL) 500 MG tablet Take 1,000 mg by mouth 2 (two) times daily as needed for moderate pain.   atorvastatin (LIPITOR) 40 MG tablet Take 40 mg by mouth at bedtime.   Biotin w/ Vitamins C & E (HAIR/SKIN/NAILS PO) Take 1 tablet by mouth in the morning.   brimonidine (ALPHAGAN) 0.2 % ophthalmic solution Place 1 drop into the left eye 3 (three) times daily.   calcium carbonate (OSCAL) 1500 (600 Ca) MG TABS tablet Take 600 mg of elemental calcium by mouth daily.   clopidogrel (PLAVIX) 75 MG tablet Take 75 mg by mouth in the morning.   Continuous Blood Gluc Sensor (FREESTYLE LIBRE 14 DAY SENSOR) MISC Inject 1 sensor to the skin every 14 days for continuous glucose monitoring.   docusate sodium (COLACE) 100 MG capsule Take 100 mg by mouth as needed for mild constipation.   glucosamine-chondroitin 500-400 MG tablet Take 1 tablet by mouth every morning.   insulin glargine (LANTUS SOLOSTAR) 100 UNIT/ML Solostar Pen  Inject 18 Units into the skin daily. In the morning. PATIENT SELF ADMINISTERS   insulin lispro (HUMALOG) 100 UNIT/ML injection Inject 5 Units into the skin at bedtime.   insulin lispro (HUMALOG) 100 UNIT/ML KwikPen Inject 5-15 Units into the skin See admin instructions. If blood sugar is 151-200 = 5 units, 201-250 = 8 units, 251-300  = 10 units, 301-350 = 12 units, 351-400 = 14 units, 401 and above =  16.  If blood sugar is greater than 200 inject 5 units at bedtime, if greater than 500 call on call provider   Insulin Pen Needle (PEN NEEDLES 3/16") 31G X 5 MM MISC 1 Device by Does not apply route 5 (five) times daily. With insulin administration   latanoprost (XALATAN) 0.005 % ophthalmic solution Place 1 drop into both eyes every evening.   lisinopril (ZESTRIL) 2.5 MG tablet  Take 2.5 mg by mouth in the morning.   miconazole (MICOTIN) 200 MG vaginal suppository Place 200 mg vaginally at bedtime as needed (vaginal itching).   Multiple Vitamins-Minerals (I-VITE PO) Take 1 tablet by mouth in the morning.   nitroGLYCERIN (NITROSTAT) 0.4 MG SL tablet Place 0.4 mg under the tongue every 5 (five) minutes as needed for chest pain.   Omega-3 Fatty Acids (FISH OIL) 1000 MG CAPS Take 1,000 mg by mouth in the morning.   omeprazole (PRILOSEC) 20 MG capsule Take 20 mg by mouth daily.   sennosides-docusate sodium (SENOKOT-S) 8.6-50 MG tablet Take 1 tablet by mouth every other day. IN THE MORNING   timolol (TIMOPTIC) 0.5 % ophthalmic solution Place 1 drop into the left eye 2 (two) times daily.   No facility-administered encounter medications on file as of 08/12/2022.    Review of Systems  Constitutional:  Negative for activity change, appetite change and fever.  HENT:  Negative for congestion.   Respiratory:  Negative for cough, shortness of breath and wheezing.   Cardiovascular:  Negative for chest pain and leg swelling.  Gastrointestinal:  Negative for nausea and vomiting.  Endocrine: Negative for  polydipsia, polyphagia and polyuria.  Genitourinary:  Negative for dysuria.  Musculoskeletal:  Positive for gait problem.  Skin:  Positive for wound.  Neurological:  Positive for weakness. Negative for dizziness.  Psychiatric/Behavioral:  Positive for confusion. Negative for dysphoric mood. The patient is not nervous/anxious.     Immunization History  Administered Date(s) Administered   Influenza, High Dose Seasonal PF 09/01/2005   PFIZER(Purple Top)SARS-COV-2 Vaccination 12/02/2019, 12/26/2019   Tdap 07/11/2014   Pertinent  Health Maintenance Due  Topic Date Due   FOOT EXAM  Never done   OPHTHALMOLOGY EXAM  Never done   COLONOSCOPY (Pts 45-36yrs Insurance coverage will need to be confirmed)  Never done   MAMMOGRAM  Never done   INFLUENZA VACCINE  04/22/2022   HEMOGLOBIN A1C  07/27/2022   DEXA SCAN  Completed      02/06/2022    8:10 PM 02/07/2022   11:00 AM 04/21/2022    2:53 PM 07/29/2022    9:15 AM 08/12/2022   11:48 AM  Fall Risk  Falls in the past year?   0 1 1  Was there an injury with Fall?   0 1 1  Fall Risk Category Calculator   0 3 3  Fall Risk Category   Low High High  Patient Fall Risk Level High fall risk High fall risk Low fall risk High fall risk High fall risk  Patient at Risk for Falls Due to   No Fall Risks History of fall(s);Impaired balance/gait;Impaired mobility;Impaired vision History of fall(s);Impaired balance/gait;Impaired mobility;Impaired vision  Fall risk Follow up   Falls evaluation completed Falls evaluation completed Falls evaluation completed   Functional Status Survey:    Vitals:   08/12/22 1145  BP: (!) 168/84  Pulse: 71  Resp: 16  Temp: (!) 97 F (36.1 C)  SpO2: 99%  Weight: 122 lb 4 oz (55.5 kg)  Height: 5\' 7"  (1.702 m)   Body mass index is 19.15 kg/m. Physical Exam Vitals reviewed.  Constitutional:      General: She is not in acute distress. HENT:     Head: Normocephalic.  Eyes:     General:        Right eye: No  discharge.        Left eye: No discharge.  Cardiovascular:     Rate and Rhythm:  Normal rate and regular rhythm.     Pulses: Normal pulses.     Heart sounds: Normal heart sounds.  Pulmonary:     Effort: Pulmonary effort is normal. No respiratory distress.     Breath sounds: Normal breath sounds. No wheezing.  Abdominal:     General: Bowel sounds are normal. There is no distension.     Palpations: Abdomen is soft.     Tenderness: There is no abdominal tenderness.  Musculoskeletal:     Cervical back: Neck supple.     Left lower leg: No edema.     Comments: Right BKA  Skin:    General: Skin is warm and dry.     Capillary Refill: Capillary refill takes less than 2 seconds.     Findings: Lesion present.     Comments: Approx < 1 cm open abrasion to left inner ankle, CDI, mild erythema, no swelling/purulent drainage/warmth/tenderness noted.   Neurological:     General: No focal deficit present.     Mental Status: She is alert. Mental status is at baseline.     Motor: Weakness present.     Gait: Gait abnormal.     Comments: wheelchair  Psychiatric:        Mood and Affect: Mood normal.        Behavior: Behavior normal.     Labs reviewed: Recent Labs    10/28/21 1039 12/18/21 0000 02/06/22 0612 02/07/22 0222 02/11/22 0000 02/20/22 0000 02/20/22 0247 04/22/22 0000 07/17/22 0000 08/09/22 1226  NA 138   < > 137 137   < > 143  --  136* 143 133*  K 4.2   < > 4.3 4.4   < > 4.3  --  4.7 4.7 4.5  CL 100   < > 101 104   < > 103  --  98* 106 97*  CO2 32   < > 27 27   < > 26*  --  24* 29* 24*  GLUCOSE 249*  --  277* 248*  --   --   --   --   --   --   BUN 24   < > 24* 22   < > 23*  --  34* 25* 25*  CREATININE 0.99   < > 0.79 0.86   < > 0.7  --  0.9 1.0  --   CALCIUM 9.8   < > 9.4 8.5*   < >  --    < > 9.6 9.2 9.3   < > = values in this interval not displayed.   Recent Labs    08/19/21 0000 10/28/21 1039 12/18/21 0000 04/22/22 0000 07/17/22 0000  AST 16 19 14 18 18   ALT 11  17 9 15 13   ALKPHOS 144*  --  139* 118 65  BILITOT  --  0.4  --   --   --   PROT  --  6.9  --   --   --   ALBUMIN 3.6  --  3.2*  --  3.7   Recent Labs    09/06/21 0937 10/02/21 0725 10/28/21 1039 12/18/21 0000 02/06/22 0612 02/07/22 0222 02/11/22 0000 03/17/22 0000 04/22/22 0000 07/17/22 0000  WBC 7.1  --  6.5   < > 7.7 7.6   < > 6.4 5.5 5.2  NEUTROABS 4,395  --  3,562  --   --   --   --   --  3.20  --   HGB 10.1*   < >  10.4*   < > 10.2* 8.5*   < > 10.3* 11.2* 10.7*  HCT 31.2*   < > 32.3*   < > 31.7* 26.6*   < > 31* 33* 32*  MCV 91.0  --  88.7  --  88.5 87.5  --   --   --   --   PLT 357  --  286   < > 367 325   < > 288 262 266   < > = values in this interval not displayed.   Lab Results  Component Value Date   TSH 3.78 04/22/2022   Lab Results  Component Value Date   HGBA1C 12.7 01/24/2022   Lab Results  Component Value Date   CHOL 173 04/22/2022   HDL 81 (A) 04/22/2022   LDLCALC 76 04/22/2022   TRIG 77 04/22/2022    Significant Diagnostic Results in last 30 days:  DG BONE DENSITY (DXA)  Result Date: 08/04/2022 EXAM: DUAL X-RAY ABSORPTIOMETRY (DXA) FOR BONE MINERAL DENSITY IMPRESSION: Referring Physician:  Virgie Sharp Your patient completed a bone mineral density test using GE Lunar iDXA system (analysis version: 16). Technologist: sec PATIENT: Name: Caitlin Sharp, Caitlin Sharp Patient ID: [id number] Birth Date: 06/24/1951 Height: 67.0 in. Sex: Female Measured: 08/04/2022 Weight: 124.0 lbs. Indications: Advanced Age, Estrogen Deficient, Postmenopausal, Rheumatoid Arthritis (714.0) Fractures: Left Foot Treatments: Calcium (E943.0), Vitamin D (E933.5) ASSESSMENT: The BMD measured at Forearm Radius 33% is 0.602 g/cm2 with a T-score of -3.2. This patient is considered osteoporotic according to [facility]) criteria. The quality of the exam is limited by the ares of the body that can be scanned for the DXA exam as the pt is wheelchair bound with a RT-sided BKA. The  LSpine scan along with B/L hip scans were not obtained due to patient condition. Site Region Measured Date Measured Age YA BMD Significant CHANGE T-score Right Forearm Radius 33% 08/04/2022 72.8 -3.2 0.602 g/cm2 World Health Organization [facility]) criteria for post-menopausal, Caucasian Women: Normal       T-score at or above -1 SD Osteopenia   T-score between -1 and -2.5 SD Osteoporosis T-score at or below -2.5 SD RECOMMENDATION: 1. All patients should optimize calcium and vitamin D intake. 2. Consider FDA-approved medical therapies in postmenopausal women and men aged 60 years and older, based on the following: a. A hip or vertebral (clinical or morphometric) fracture. b. T-score = -2.5 at the femoral neck or spine after appropriate evaluation to exclude secondary causes. c. Low bone mass (T-score between -1.0 and -2.5 at the femoral neck or spine) and a 10-year probability of a hip fracture = 3% or a 10-year probability of a major osteoporosis-related fracture = 20% based on the [place]-adapted WHO algorithm. d. Clinician judgment and/or patient preferences may indicate treatment for people with 10-year fracture probabilities above or below these levels. FOLLOW-UP: Patients with diagnosis of osteoporosis or at high risk for fracture should have regular bone mineral density tests.? Patients eligible for Medicare are allowed routine testing every 2 years.? The testing frequency can be increased to one year for patients who have rapidly progressing disease, are receiving or discontinuing medical therapy to restore bone mass, or have additional risk factors. I have reviewed this study and agree with the findings. [facility] Electronically Signed   By: Franki Cabot M.D.   On: 08/04/2022 10:05    Assessment/Plan 1. Abrasion of skin of left lower leg - < 1 cm open abrasion to left inner ankle, CDI, no  sign of infection - cleanse with saline, apply antibiotic ointment, cover with non adherent dressing QAM x  7 days - recommend wearing longer socks to protect skin  2. Type 1 diabetes mellitus with hyperglycemia (HCC) - 11/17 sugars elevated> 494> asymptomatic - suspect due to malfunction with Freestyle Libre - sugars now back to normal - no recent hypoglycemias - cont Lantus and SSI    Family/ staff Communication: plan discussed with patient and nurse  Labs/tests ordered:  none

## 2022-08-19 ENCOUNTER — Ambulatory Visit: Payer: Medicare Other | Admitting: Rehabilitation

## 2022-08-19 ENCOUNTER — Encounter: Payer: Self-pay | Admitting: Rehabilitation

## 2022-08-19 DIAGNOSIS — M6281 Muscle weakness (generalized): Secondary | ICD-10-CM

## 2022-08-19 DIAGNOSIS — R2689 Other abnormalities of gait and mobility: Secondary | ICD-10-CM

## 2022-08-19 DIAGNOSIS — R2681 Unsteadiness on feet: Secondary | ICD-10-CM

## 2022-08-19 DIAGNOSIS — R293 Abnormal posture: Secondary | ICD-10-CM

## 2022-08-19 NOTE — Therapy (Signed)
OUTPATIENT PHYSICAL THERAPY TREATMENT NOTE/PROGRESS NOTE   Patient Name: Caitlin Sharp MRN: [id number] DOB:May 14, 1951, 71 y.o., female Today's Date: 08/19/2022  PCP: Veleta Miners, MD REFERRING PROVIDER: Mechele Claude, PA-C  END OF SESSION:   PT End of Session - 08/19/22 1019     Visit Number 10    Number of Visits 17    Date for PT Re-Evaluation 09/07/22    Authorization Type [employer] Medicare (10th visit PN needed)    PT Start Time 1017    PT Stop Time 1105    PT Time Calculation (min) 48 min    Equipment Utilized During Treatment Gait belt    Activity Tolerance Patient tolerated treatment well    Behavior During Therapy WFL for tasks assessed/performed               Past Medical History:  Diagnosis Date   Abnormal thyroid blood test    Per Records recevied from [facility], Dr.Pearson   Acute deep vein thrombosis of right peroneal vein ([place]) 10/05/2020   RLE DVT   Anxiety    Arthritis    RA   Coronary artery disease    Diabetes type I ([place])    Per [facility] New Patient Packet   Diabetic retinopathy ([place])    Per [place] New Patient Packet   DKA (diabetic ketoacidosis) ([place]) 2021   Dr.William Zackowski.  Insulin Pump was out of Insulin   Glaucoma    Per [place] Patient Packet   H/O heart bypass surgery 2012   Dr. Luana Shu, Per [facility] New Patient Packet   Heart murmur    when I was a teenager   History of blood transfusion    History of kidney stones    passed   Hx of CABG    Per records received form [facility], Dr.Pearson    Left bundle branch block    Mild mitral regurgitation    Per records from [facility] [place]    Neuropathy 1976   Per [place] Patient Packet   NSTEMI (non-ST elevated myocardial infarction) ([place]) 09/21/2020   NSTEMI/demand ischemic in setting of DKA, patent grafts by 09/24/20 [facility], continue medical therapy   Osteoporosis    T Score -2.7 , Per [place] New Patient Packet   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 with diabetes  mellitus due to underlying condition [facility])    Per Records recevied from [facility], Dr.Pearson   PVD (peripheral vascular disease) ([place]) 10/27/2021   Type 1 diabetes mellitus with hyperglycemia ([place]) 10/28/2021   Past Surgical History:  Procedure Laterality Date   ABDOMINAL AORTOGRAM W/LOWER EXTREMITY N/A 10/02/2021   Procedure: ABDOMINAL AORTOGRAM W/LOWER EXTREMITY;  Surgeon: Broadus John, MD;  Location: [place] [facility];  Service: Cardiovascular;  Laterality: N/A;   AMPUTATION Right 04/17/2021   Procedure: RIGHT GREAT TOE AMPUTATION;  Surgeon: Newt Minion, MD;  Location: [place];  Service: Orthopedics;  Laterality: Right;   AMPUTATION Right 07/19/2021   Procedure: RIGHT FOOT 1ST RAY AMPUTATION;  Surgeon: Newt Minion, MD;  Location: [place];  Service: Orthopedics;  Laterality: Right;   AMPUTATION Right 02/06/2022   Procedure: AMPUTATION BELOW KNEE;  Surgeon: Wylene Simmer, MD;  Location: [place];  Service: Orthopedics;  Laterality: Right;  regional block   CESAREAN SECTION  1970   Dr. Moses Manners, Per [facility] New Patient Packet   CESAREAN SECTION  1973   Dr. 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 ([place]) 07/29/2022   Ischemic cardiomyopathy 07/29/2022   Moderate episode of recurrent major depressive disorder ([place]) 07/29/2022   Anemia of chronic disease 07/29/2022   S/P BKA (below knee amputation) unilateral, right ([place]) 07/29/2022   Daytime sleepiness 07/29/2022   Cognitive impairment 07/29/2022   Osteomyelitis ([place]) 02/06/2022   Osteomyelitis of right foot ([place]) 02/06/2022   Type 1 diabetes mellitus with hyperglycemia ([place]) 10/28/2021   PVD (peripheral vascular disease) ([place]) 10/27/2021   Osteomyelitis of great toe of right foot ([facility])    NSVT (nonsustained ventricular tachycardia) ([place]) 01/17/2021   Acute deep vein thrombosis (DVT) of right peroneal  vein ([place]) 10/17/2020   Insulin long-term use ([place]) 07/18/2020   Bruit of left carotid artery 10/31/2019   Coronary artery disease involving native coronary artery of native heart without angina pectoris 10/31/2019   Open-angle glaucoma of right eye 10/31/2019   Dry eyes 03/01/2018   Presence of intraocular lens 10/18/2015   Cystoid macular edema 06/14/2012    REFERRING DIAG: Z89.511 (ICD-10-CM) - Acquired absence of right leg below knee Z47.89 (ICD-10-CM) - Encounter for prosthetic gait training    THERAPY DIAG:  Unsteadiness on feet  Abnormal posture  Other abnormalities of gait and mobility  Muscle weakness (generalized)  Rationale for Evaluation and Treatment Rehabilitation  PERTINENT HISTORY: Hx of BKA to the right leg due to osteomyelitis 02/06/22. Worked with therapy and then transferred to assisted living. She has been a Type 1 DM for over 50 years. Used to be on a insulin pump now on lantus and meal coverage.  Recently saw endocrinology and they lowered her lantus as she voiced concern for hypoglycemia. A1C 8.6 on 04/22/22 which is improved  She keeps getting skin tears from hitting objects such as her WC She is awaiting a prosthesis from [facility].  Was treated for thigh cellulitis due to an infected skin tear with Doxycycline on 04/29/22.  She reports the area is much improved. Now has a skin tear to the LLE that has some mild yellow drainage and redness per nursing staff that is concerning.   PRECAUTIONS: Fall  SUBJECTIVE:                                                                                                                                                                                      SUBJECTIVE STATEMENT:   Pt has not been wearing leg for 3 hours/2x/day.  She reports she did wear most of the day on Thanksgiving.    PAIN:  Are you having pain? No   OBJECTIVE: (objective measures  completed at initial evaluation unless otherwise dated)  TODAY'S TREATMENT:   CURRENT PROSTHETIC WEAR ASSESSMENT: Patient is independent with:  She is now able to assist some with doffing leg, donning socks; she continues to have difficulty w/ pressing the button at the distal end of the socket but was able doe doff remaining layers.  She also was able do don socks/liner/leg today with only min A to tip leg appropriately to get leg into socket.   Patient is dependent with: skin check, residual limb care, prosthetic cleaning, ply sock cleaning, correct ply sock adjustment, proper wear schedule/adjustment, proper weight-bearing schedule/adjustment Donning prosthesis: Min A Doffing prosthesis: min A Prosthetic wear tolerance:  Pt inconsistent with prosthetic wear wearing some days for 3 hours, but other days (like yesterday) only 30 mins.  Continue to encourage 3 hours, 2x/day as long as skin looks good.   Prosthetic weight bearing tolerance: approx 4-5 minutes at a time Edema: none noted Residual limb condition: Skin is intact today. Skin on most distal-lateral portion of limb appears dry and mildly flaky. Prosthetic description: Pin lock suspension K code/activity level with prosthetic use: Level 3  Pt presents today with vivewear under liner, and 1 ply sock total. This seemed to be appropriate amount of socks during this session.  At end of session she was somewhat too far in socket and suggested she could try 2, 1ply socks when she redons this afternoon.    GAIT: Gait pattern: step to pattern, step through pattern, decreased stride length, lateral hip instability, trunk flexed, and narrow BOS Distance walked: 115'  Assistive device utilized: Environmental consultant - 2 wheeled Level of assistance: SBA and CGA Comments: Intermittent scuffing of prosthetic toe on floor and decreased heel strike.  Still having some continued R knee flexion but this did improve with continued gait and exercise. No excessive sinking in socket noted during ambulation.  Provided external cues to improve step  size and BOS.    STAIRS:  Level of Assistance: CGA  Stair Negotiation Technique: Step to Pattern Forwards with Bilateral Rails  Number of Stairs: 4   Height of Stairs: 6  Comments: Had not seen patient attempt stairs, so we did during this session.  She did very well with cues for sequencing and technique.  Following stairs and exercise, son states that their ramp is temporary and only has handrail on R side going up.  Feel that she would do well going up sideways so will put in plan to try next session.    In // bars: began with LLE step taps to 4" step x 10 reps with BUE support>LUE only x 5 reps>RUE support only (to increase R lateral weight shift) x 5 reps.  Standing on small rocker board biased horizontally with feet apart EO, BUE support to maintain level balance 20 secs>single UE support x 2 sets of 20 secs>no UE support x 2 sets of 20 secs.  Cues for upright posture, forward gaze to mirror for feedback on posture and decreased hip IR/add.  Standing on foam airex with feet apart without UE support (needed intermittent support at times) x 3 sets of 20 secs.  Ended with cone taps, alt LEs x 10 reps with BUE support and then RUE only support x 3 reps (this caused increased pressure so only did a few times to show improved R lateral weight shifting).     PATIENT EDUCATED ON FOLLOWING PROSTHETIC CARE: Prosthetic wear tolerance: 4 hours/day, 7 days/week; Discussed progressing towards 6 hours per day  Prosthetic weight bearing tolerance: 2-3 minutes; Re-iterated adjusting tolerance to 4-5 minutes before rest Other education  Skin check, Residual limb care, Care of non-amputated limb, Prosthetic cleaning, Ply sock cleaning, Correct ply sock adjustment, [place] donning, Proper doffing, Proper wear schedule/adjustment, and Proper weight-bearing schedule/adjustment   PATIENT EDUCATION:  Continue with HEP and walking as able   HOME EXERCISE PROGRAM:  Access Code: [email or web address] URL:  [email or web address] Date: 08/05/2022 Prepared by: Cameron Sprang  Exercises - Side Stepping with Counter Support  - 1 x daily - 7 x weekly - 2 sets - 10 reps - Standing Hip Abduction with Counter Support  - 1 x daily - 7 x weekly - 2 sets - 10 reps - Standing Thoracic and Cervical Rotation with Reach at [place]  - 1 x daily - 7 x weekly - 2 sets - 10 reps  INSTRUCTIONS FOR CNAS ON DONNING/DOFFING PROSTHESIS:   1:  WHEN WEARING PROSTHESIS, DON SNUG (SMALLER) BLACK CUT OFF SOCK FIRST UNDER SILICONE LINER.  HAVE HER SCOOT TO EDGE OF CHAIR.  THIS SHOULD BE DIRECTLY ON SKIN.  IF SHE IS HAVING BLEEDING OR IT GETS SOILED, SWITCH TO HER OTHER BLACK CUT OFF SOCK.  WHEN NOT WEARING PROSTHESIS, WEAR BLACK CUT OFF SOCK THAT IS LARGER WITH GRAY SHRINKER WITH RING.    2: INVERT LINER AND ENSURE THAT END IS AS FLAT AS POSSIBLE AND ENSURE PIN IS IN LINE WITH BONE.     3: WHEN DONNING WHITE SOCKS, HAVE HER TRY AND BUNDLE UP AND PLACE MIDDLE FINGERS IN HOLE SO THAT SHE KNOWS WHERE HOLE IS WHEN PUTTING ON.     3: PLACE PROSTHESIS ON FLOOR AND TIP BACK ON HEEL, HAVE HER PLACE LIMB INSIDE TO HEAR AT LEAST ONE CLICK PRIOR TO STANDING.  WORK ON HAVING HER PUSH HER KNEE DOWN INTO SOCKET.    4:  CAN PLACE SMALL DOT OF BABY/MINERAL OIL ON KNEE CAP AND RUB UPWARD TO PREVENT FRICTION RUBBING FROM LINER.    5: BEGIN WEARING PROSTHESIS 2 HOURS ON THEN DO 2 HOURS OFF, THEN 2 HOURS ON (CAN DO 2-3 TIMES A DAY LIKE THIS).  AFTER A WEEK, IF SKIN STILL LOOKS GOOD/HEALING, INCREASE TO 3 HOURS ON, 2 HOURS OFF, 3 HOURS ON.  (CAN DO 2-3 TIMES PER DAY AS WELL).     Progress Note Reporting Period 07/09/22 to 08/19/22  See note below for Objective Data and Assessment of Progress/Goals.      ASSESSMENT:   CLINICAL IMPRESSION: Skilled session continues to focus on gait with RW for improved quality and R knee/hip extension.  Also continue to work on balance with decreasing support in // bars to encouraged improved  R lateral weight shift onto prosthesis.  Pt doing well and continue to encourage her to wear leg for 6 hours total per day (with break in between), working on walking to bathroom (with assist from facility per their requirements) in hopes of beginning to walk into therapy next week.    OBJECTIVE IMPAIRMENTS Abnormal gait, decreased activity tolerance, decreased balance, decreased endurance, decreased knowledge of use of DME, decreased mobility, difficulty walking, decreased ROM, decreased strength, impaired perceived functional ability, impaired flexibility, postural dysfunction, and prosthetic dependency .    ACTIVITY LIMITATIONS carrying, lifting, bending, standing, squatting, stairs, transfers, bathing, toileting, dressing, reach over head, hygiene/grooming, and locomotion level   PARTICIPATION LIMITATIONS: cleaning, laundry, driving, shopping, and community activity   PERSONAL FACTORS 3+ comorbidities: see above  are also affecting patient's functional outcome.  REHAB POTENTIAL: Good   CLINICAL DECISION MAKING: Evolving/moderate complexity   EVALUATION COMPLEXITY: Moderate     GOALS: Goals reviewed with patient? Yes   SHORT TERM GOALS: Target date: 08/06/2022     Pt/caregiver will be IND with initial HEP in order to indicate improved functional mobility and dec fall risk. Baseline: Goal status: MET    2.  Pt will be able to don/doff prosthesis at min A level and tolerate 6 hours of wear time per day without skin break down. Baseline: mod/max A at times  Goal status: Progressing    3.  Pt will tolerate standing x 5 mins with intermittent support at S level in order to indicate improved independence with ADLs.  Baseline: Can stand up to 2 mins without support  Goal status: Progressing    4.  Will assess BERG balance when able and set LTG to reflect decreased fall risk.  Baseline: (08/12/2022) 27/56 Goal status: MET   5.  Will assess gait speed and set LTG to reflect  progress.  Baseline: 1.29 ft/sec (08/12/2022) Goal status: MET   6.  Pt will ambulate x 100' with RW and prosthesis at min A level in order to indicate improved household mobility.  Baseline: 115' Goal status: MET    LONG TERM GOALS: Target date: 09/07/2022     Pt will be IND with final HEP in order to indicate improved functional mobility and dec fall risk. Baseline:  Goal status: INITIAL   2.  Pt will ambulate x 150' indoors and 300' outdoors over unlevel paved surfaces with RW at S level in order to indicate improved household and community mobility.  Baseline:  Goal status: INITIAL   3.  Pt will negotiate up/down curb/ramp with RW and 4 steps with rails at S level in order to indicate improved community access.   Baseline:  Goal status: INITIAL   4.  Pt will tolerate standing x 10 mins with intermittent UE support while performing ADLs in order to indicate improved household independence.  Baseline:  Goal status: INITIAL   5.  Pt will increase BERG balance score to >/=40/56 to demonstrate improved static balance.  Baseline: 27/56 (11/21) Goal status: INITIAL   6.  Pt will demonstrate a gait speed of >/=2.0 feet/sec in order to decrease risk for falls. Baseline: 1.29 ft/sec w/ RW (11/21) Goal status: INITIAL   7.  Pt will don/doff prosthesis at S level and tolerate wearing prosthesis >/=90% of awake time in order to indicate more independent prosthetic care.   Baseline:  Goal status: INITIAL     PLAN: PT FREQUENCY: 2x/week   PT DURATION: 8 weeks   PLANNED INTERVENTIONS: Therapeutic exercises, Therapeutic activity, Neuromuscular re-education, Balance training, Gait training, Patient/Family education, Self Care, Joint mobilization, Stair training, Vestibular training, Prosthetic training, DME instructions, and Manual therapy   PLAN FOR NEXT SESSION: Is she wearing 6 hours total per day?? I want her to try stairs sideways with R rail as her son would like to get rid of  temp ramp if possible, also I want to see if she can get prone for hip flex stretch.  Add to HEP if appropriate. Continue to check skin.  Continue prosthetic education.  Prosthetic gait, balance, SLS tasks.  have her work on donning/doffing prosthesis.  Add to standing exercises.    Cameron Sprang, PT, MPT [facility] [address] [place], [place], [address] Phone: [phone]   Fax:  [phone] 08/19/22, 11:15 AM

## 2022-08-20 ENCOUNTER — Encounter: Payer: Self-pay | Admitting: Orthopedic Surgery

## 2022-08-20 ENCOUNTER — Non-Acute Institutional Stay: Payer: Medicare Other | Admitting: Orthopedic Surgery

## 2022-08-20 DIAGNOSIS — E1065 Type 1 diabetes mellitus with hyperglycemia: Secondary | ICD-10-CM | POA: Diagnosis not present

## 2022-08-20 DIAGNOSIS — R350 Frequency of micturition: Secondary | ICD-10-CM

## 2022-08-20 MED ORDER — BD GLUCOSE 5 G PO CHEW: 15.0000 g | CHEWABLE_TABLET | ORAL | 12 refills | Status: DC | PRN

## 2022-08-20 NOTE — Progress Notes (Signed)
Location:  [facility] Room Number: 628/A Place of Service:  ALF [phone]) Provider:  Yvonna Alanis, NP   [name], MD  Patient Care Team: [name], MD as PCP - General (Internal Medicine) San Morelle, MD as Referring Physician (Ophthalmology) Atilano Median, Gayleen Orem, MD as Referring Physician (Cardiology) Alyssa Grove, MD as Referring Physician (Cardiology)  Extended Emergency Contact Information Primary Emergency Contact: [name] Address: [address]          [place], [place] [address] [name] of [place] Phone: [phone] Mobile Phone: [phone] Relation: Son Preferred language: English Interpreter needed? No Secondary Emergency Contact: [name],[name] Mobile Phone: [phone] Relation: Relative  Code Status:  Full code Goals of care: Advanced Directive information    08/12/2022   11:56 AM  Advanced Directives  Does Patient Have a Medical Advance Directive? Yes  Type of Paramedic of [place];Living will;Out of facility DNR (pink MOST or yellow form)  Does patient want to make changes to medical advance directive? No - Patient declined  Copy of [place] in Chart? Yes - validated most recent copy scanned in chart (See row information)     Chief Complaint  Patient presents with   Acute Visit    HPI:  Pt is a 71 y.o. female seen today for acute visit due to hypoglycemia.   She currently resides on the assisted living unit at [facility] PMH: DVT, CAD, cardiomyopathy, PVD, T1DM, osteomyelitis, R BKA, anemia, depression, and unstable gait.   Lunch blood glucose 53. She was drowsy, pale and diaphoretic during our encounter. She received SSI 5 units and Lantus 18 units this morning. She ate oatmeal and strawberries for breakfast. She was awake during whole event and able to answer simple questions. Repeat blood glucose 56 (check with different meter). She began to  eat a sandwich and drink sips of coca cola at end of encounter.   Nursing reports increased urinary frequency x 2 days. She admits to a few incontinent episodes as well. Denies dysuria, fever or flank pain.    Past Medical History:  Diagnosis Date   Abnormal thyroid blood test    Per Records recevied from [facility], Dr.Pearson   Acute deep vein thrombosis of right peroneal vein ([place]) 10/05/2020   RLE DVT   Anxiety    Arthritis    RA   Coronary artery disease    Diabetes type I [facility])    Per [facility] New Patient Packet   Diabetic retinopathy ([place])    Per [facility] New Patient Packet   DKA (diabetic ketoacidosis) ([place]) 2021   Dr.William Zackowski.  Insulin Pump was out of Insulin   Glaucoma    Per [place] Patient Packet   H/O heart bypass surgery 2012   Dr. Luana Shu, Per [facility] New Patient Packet   Heart murmur    when I was a teenager   History of blood transfusion    History of kidney stones    passed   Hx of CABG    Per records received form [facility], Dr.Pearson    Left bundle branch block    Mild mitral regurgitation    Per records from [facility] [place]    Neuropathy 1976   Per [place] Patient Packet   NSTEMI (non-ST elevated myocardial infarction) ([place]) 09/21/2020   NSTEMI/demand ischemic in setting of DKA, patent grafts by 09/24/20 [facility], continue medical therapy   Osteoporosis    T Score -2.7 ,  Per [facility] New Patient Packet   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 due to underlying condition [facility])    Per Records recevied from [facility], Dr.Pearson   PVD (peripheral vascular disease) ([place]) 10/27/2021   Type 1 diabetes mellitus with hyperglycemia ([place]) 10/28/2021   Past Surgical History:  Procedure Laterality Date   ABDOMINAL AORTOGRAM W/LOWER EXTREMITY N/A 10/02/2021   Procedure: ABDOMINAL AORTOGRAM W/LOWER EXTREMITY;  Surgeon: Broadus John, MD;  Location: [place] CV LAB;  Service: Cardiovascular;  Laterality: N/A;   AMPUTATION  Right 04/17/2021   Procedure: RIGHT GREAT TOE AMPUTATION;  Surgeon: Newt Minion, MD;  Location: [place];  Service: Orthopedics;  Laterality: Right;   AMPUTATION Right 07/19/2021   Procedure: RIGHT FOOT 1ST RAY AMPUTATION;  Surgeon: Newt Minion, MD;  Location: [place];  Service: Orthopedics;  Laterality: Right;   AMPUTATION Right 02/06/2022   Procedure: AMPUTATION BELOW KNEE;  Surgeon: Wylene Simmer, MD;  Location: [place];  Service: Orthopedics;  Laterality: Right;  regional block   CESAREAN SECTION  1970   Dr. Moses Manners, Per [facility] New Patient Packet   CESAREAN SECTION  1973   Dr. Moses Manners, Per [facility] New Patient Packet   CORONARY ARTERY BYPASS GRAFT      Allergies  Allergen Reactions   Penicillins Anaphylaxis    Outpatient Encounter Medications as of 08/20/2022  Medication Sig   acetaminophen (TYLENOL) 500 MG tablet Take 1,000 mg by mouth 2 (two) times daily as needed for moderate pain.   atorvastatin (LIPITOR) 40 MG tablet Take 40 mg by mouth at bedtime.   Biotin w/ Vitamins C & E (HAIR/SKIN/NAILS PO) Take 1 tablet by mouth in the morning.   brimonidine (ALPHAGAN) 0.2 % ophthalmic solution Place 1 drop into the left eye 3 (three) times daily.   calcium carbonate (OSCAL) 1500 (600 Ca) MG TABS tablet Take 600 mg of elemental calcium by mouth daily.   clopidogrel (PLAVIX) 75 MG tablet Take 75 mg by mouth in the morning.   Continuous Blood Gluc Sensor (FREESTYLE LIBRE 14 DAY SENSOR) MISC Inject 1 sensor to the skin every 14 days for continuous glucose monitoring.   docusate sodium (COLACE) 100 MG capsule Take 100 mg by mouth as needed for mild constipation.   glucosamine-chondroitin 500-400 MG tablet Take 1 tablet by mouth every morning.   insulin glargine (LANTUS SOLOSTAR) 100 UNIT/ML Solostar Pen Inject 18 Units into the skin daily. In the morning. PATIENT SELF ADMINISTERS   insulin lispro (HUMALOG) 100 UNIT/ML injection Inject 5 Units into the skin at bedtime.   insulin lispro (HUMALOG)  100 UNIT/ML KwikPen Inject 5-15 Units into the skin See admin instructions. If blood sugar is 151-200 = 5 units, 201-250 = 8 units, 251-300  = 10 units, 301-350 = 12 units, 351-400 = 14 units, 401 and above =  16.  If blood sugar is greater than 200 inject 5 units at bedtime, if greater than 500 call on call provider   Insulin Pen Needle (PEN NEEDLES 3/16") 31G X 5 MM MISC 1 Device by Does not apply route 5 (five) times daily. With insulin administration   latanoprost (XALATAN) 0.005 % ophthalmic solution Place 1 drop into both eyes every evening.   lisinopril (ZESTRIL) 2.5 MG tablet Take 2.5 mg by mouth in the morning.   miconazole (MICOTIN) 200 MG vaginal suppository Place 200 mg vaginally at bedtime as needed (vaginal itching).   Multiple Vitamins-Minerals (I-VITE PO) Take 1 tablet by  mouth in the morning.   nitroGLYCERIN (NITROSTAT) 0.4 MG SL tablet Place 0.4 mg under the tongue every 5 (five) minutes as needed for chest pain.   Omega-3 Fatty Acids (FISH OIL) 1000 MG CAPS Take 1,000 mg by mouth in the morning.   omeprazole (PRILOSEC) 20 MG capsule Take 20 mg by mouth daily.   sennosides-docusate sodium (SENOKOT-S) 8.6-50 MG tablet Take 1 tablet by mouth every other day. IN THE MORNING   timolol (TIMOPTIC) 0.5 % ophthalmic solution Place 1 drop into the left eye 2 (two) times daily.   No facility-administered encounter medications on file as of 08/20/2022.    Review of Systems  Constitutional:  Positive for chills. Negative for fatigue and fever.  HENT:  Negative for sore throat and trouble swallowing.   Respiratory:  Negative for cough, shortness of breath and wheezing.   Cardiovascular:  Negative for chest pain and leg swelling.  Gastrointestinal:  Negative for constipation, diarrhea, nausea and vomiting.  Genitourinary:  Positive for frequency. Negative for dysuria and hematuria.  Musculoskeletal:  Positive for gait problem.  Skin:  Positive for color change.  Neurological:  Positive  for weakness. Negative for dizziness and headaches.  Psychiatric/Behavioral:  Negative for confusion and dysphoric mood. The patient is not nervous/anxious.     Immunization History  Administered Date(s) Administered   Influenza, High Dose Seasonal PF 09/01/2005   PFIZER(Purple Top)SARS-COV-2 Vaccination 12/02/2019, 12/26/2019   Tdap 07/11/2014   Pertinent  Health Maintenance Due  Topic Date Due   FOOT EXAM  Never done   OPHTHALMOLOGY EXAM  Never done   COLONOSCOPY (Pts 45-45yrs Insurance coverage will need to be confirmed)  Never done   MAMMOGRAM  Never done   INFLUENZA VACCINE  04/22/2022   HEMOGLOBIN A1C  07/27/2022   DEXA SCAN  Completed      02/06/2022    8:10 PM 02/07/2022   11:00 AM 04/21/2022    2:53 PM 07/29/2022    9:15 AM 08/12/2022   11:48 AM  [place] in the past year?   0 1 1  Was there an injury with Fall?   0 1 1  Fall Risk Category Calculator   0 3 3  Fall Risk Category   Low High High  Patient Fall Risk Level High fall risk High fall risk Low fall risk High fall risk High fall risk  Patient at Risk for Falls Due to   No Fall Risks History of fall(s);Impaired balance/gait;Impaired mobility;Impaired vision History of fall(s);Impaired balance/gait;Impaired mobility;Impaired vision  Fall risk Follow up   Falls evaluation completed Falls evaluation completed Falls evaluation completed   Functional Status Survey:    Vitals:   08/20/22 1508  BP: (!) 171/74  Pulse: 76  Resp: 18  Temp: (!) 97.5 F (36.4 C)  SpO2: 98%  Weight: 126 lb 8 oz (57.4 kg)  Height: 5\' 7"  (1.702 m)   Body mass index is 19.81 kg/m. Physical Exam Vitals reviewed.  Constitutional:      General: She is not in acute distress.    Comments: Pale, diaphoretic  Eyes:     General:        Right eye: No discharge.        Left eye: No discharge.  Cardiovascular:     Rate and Rhythm: Normal rate and regular rhythm.     Pulses: Normal pulses.     Heart sounds: Normal heart sounds.   Pulmonary:     Effort: Pulmonary effort is normal.  No respiratory distress.     Breath sounds: Normal breath sounds. No wheezing.  Abdominal:     General: Bowel sounds are normal. There is no distension.     Palpations: Abdomen is soft.     Tenderness: There is no abdominal tenderness.  Musculoskeletal:     Cervical back: Neck supple.     Left lower leg: No edema.     Comments: Right BKA  Skin:    General: Skin is warm.     Capillary Refill: Capillary refill takes less than 2 seconds.     Coloration: Skin is pale.  Neurological:     General: No focal deficit present.     Mental Status: She is oriented to person, place, and time and easily aroused.     Motor: Weakness present.     Gait: Gait abnormal.     Comments: wheelchair  Psychiatric:        Mood and Affect: Mood normal.        Behavior: Behavior normal.     Labs reviewed: Recent Labs    10/28/21 1039 12/18/21 0000 02/06/22 0612 02/07/22 0222 02/11/22 0000 02/20/22 0000 02/20/22 0247 04/22/22 0000 07/17/22 0000 08/09/22 1226  NA 138   < > 137 137   < > 143  --  136* 143 133*  K 4.2   < > 4.3 4.4   < > 4.3  --  4.7 4.7 4.5  CL 100   < > 101 104   < > 103  --  98* 106 97*  CO2 32   < > 27 27   < > 26*  --  24* 29* 24*  GLUCOSE 249*  --  277* 248*  --   --   --   --   --   --   BUN 24   < > 24* 22   < > 23*  --  34* 25* 25*  CREATININE 0.99   < > 0.79 0.86   < > 0.7  --  0.9 1.0  --   CALCIUM 9.8   < > 9.4 8.5*   < >  --    < > 9.6 9.2 9.3   < > = values in this interval not displayed.   Recent Labs    10/28/21 1039 12/18/21 0000 04/22/22 0000 07/17/22 0000  AST 19 14 18 18   ALT 17 9 15 13   ALKPHOS  --  139* 118 65  BILITOT 0.4  --   --   --   PROT 6.9  --   --   --   ALBUMIN  --  3.2*  --  3.7   Recent Labs    09/06/21 0937 10/02/21 0725 10/28/21 1039 12/18/21 0000 02/06/22 0612 02/07/22 0222 02/11/22 0000 03/17/22 0000 04/22/22 0000 07/17/22 0000  WBC 7.1  --  6.5   < > 7.7 7.6   < > 6.4  5.5 5.2  NEUTROABS 4,395  --  3,562  --   --   --   --   --  3.20  --   HGB 10.1*   < > 10.4*   < > 10.2* 8.5*   < > 10.3* 11.2* 10.7*  HCT 31.2*   < > 32.3*   < > 31.7* 26.6*   < > 31* 33* 32*  MCV 91.0  --  88.7  --  88.5 87.5  --   --   --   --   PLT 357  --  286   < > 367 325   < > 288 262 266   < > = values in this interval not displayed.   Lab Results  Component Value Date   TSH 3.78 04/22/2022   Lab Results  Component Value Date   HGBA1C 12.7 01/24/2022   Lab Results  Component Value Date   CHOL 173 04/22/2022   HDL 81 (A) 04/22/2022   LDLCALC 76 04/22/2022   TRIG 77 04/22/2022    Significant Diagnostic Results in last 30 days:  DG BONE DENSITY (DXA)  Result Date: 08/04/2022 EXAM: DUAL X-RAY ABSORPTIOMETRY (DXA) FOR BONE MINERAL DENSITY IMPRESSION: Referring Physician:  [name] Your patient completed a bone mineral density test using GE Lunar iDXA system (analysis version: 16). Technologist: sec PATIENT: Name: Kynna, Ealy Patient ID: [id number] Birth Date: Feb 12, 1951 Height: 67.0 in. Sex: Female Measured: 08/04/2022 Weight: 124.0 lbs. Indications: Advanced Age, Estrogen Deficient, Postmenopausal, Rheumatoid Arthritis (714.0) Fractures: Left Foot Treatments: Calcium (E943.0), Vitamin D (E933.5) ASSESSMENT: The BMD measured at Forearm Radius 33% is 0.602 g/cm2 with a T-score of -3.2. This patient is considered osteoporotic according to [facility]) criteria. The quality of the exam is limited by the ares of the body that can be scanned for the DXA exam as the pt is wheelchair bound with a RT-sided BKA. The LSpine scan along with B/L hip scans were not obtained due to patient condition. Site Region Measured Date Measured Age YA BMD Significant CHANGE T-score Right Forearm Radius 33% 08/04/2022 72.8 -3.2 0.602 g/cm2 World Health Organization [facility]) criteria for post-menopausal, Caucasian Women: Normal       T-score at or above -1 SD Osteopenia   T-score  between -1 and -2.5 SD Osteoporosis T-score at or below -2.5 SD RECOMMENDATION: 1. All patients should optimize calcium and vitamin D intake. 2. Consider FDA-approved medical therapies in postmenopausal women and men aged 16 years and older, based on the following: a. A hip or vertebral (clinical or morphometric) fracture. b. T-score = -2.5 at the femoral neck or spine after appropriate evaluation to exclude secondary causes. c. Low bone mass (T-score between -1.0 and -2.5 at the femoral neck or spine) and a 10-year probability of a hip fracture = 3% or a 10-year probability of a major osteoporosis-related fracture = 20% based on the [place]-adapted WHO algorithm. d. Clinician judgment and/or patient preferences may indicate treatment for people with 10-year fracture probabilities above or below these levels. FOLLOW-UP: Patients with diagnosis of osteoporosis or at high risk for fracture should have regular bone mineral density tests.? Patients eligible for Medicare are allowed routine testing every 2 years.? The testing frequency can be increased to one year for patients who have rapidly progressing disease, are receiving or discontinuing medical therapy to restore bone mass, or have additional risk factors. I have reviewed this study and agree with the findings. [facility] Electronically Signed   By: Franki Cabot M.D.   On: 08/04/2022 10:05    Assessment/Plan 1. Type 1 diabetes mellitus with hyperglycemia ([place]) - 11/21 noted to have hyperglycemia> blood glucose 494 - known to refuse insulin at times - hypoglycemia after AM insulin> initial 53> rechecked 56  - she was given SSI 5 units and Lantus 18 units after breakfast - pale, diaphoretic, drowsy during encounter, rechecked CBG 363 - start glucose tablets 2 mg prn for blood glucose < 70, administer every 15 minutes until blood sugar > 70  2. Urinary frequency - onset 2 days  ago - also having incontinence - afebrile, no  dysuria/hematuria - UA/culture    Family/ staff Communication: plan discussed with patient and nurse  Labs/tests ordered:  UA/culture

## 2022-08-22 ENCOUNTER — Ambulatory Visit: Payer: Medicare Other | Attending: Student | Admitting: Physical Therapy

## 2022-08-22 DIAGNOSIS — R2689 Other abnormalities of gait and mobility: Secondary | ICD-10-CM | POA: Insufficient documentation

## 2022-08-22 DIAGNOSIS — R293 Abnormal posture: Secondary | ICD-10-CM | POA: Insufficient documentation

## 2022-08-22 DIAGNOSIS — R2681 Unsteadiness on feet: Secondary | ICD-10-CM | POA: Insufficient documentation

## 2022-08-22 DIAGNOSIS — M6281 Muscle weakness (generalized): Secondary | ICD-10-CM | POA: Insufficient documentation

## 2022-08-23 LAB — BASIC METABOLIC PANEL
BUN: 42 — AB (ref 4–21)
CO2: 24 — AB (ref 13–22)
Chloride: 104 (ref 99–108)
Creatinine: 0.9 (ref 0.5–1.1)
Glucose: 148
Potassium: 4.3 mEq/L (ref 3.5–5.1)
Sodium: 139 (ref 137–147)

## 2022-08-23 LAB — HEPATIC FUNCTION PANEL
ALT: 18 U/L (ref 7–35)
AST: 19 (ref 13–35)
Alkaline Phosphatase: 74 (ref 25–125)
Bilirubin, Total: 0.3

## 2022-08-23 LAB — COMPREHENSIVE METABOLIC PANEL
Albumin: 3.6 (ref 3.5–5.0)
Calcium: 9.5 (ref 8.7–10.7)
Globulin: 2.1
eGFR: 66

## 2022-08-23 LAB — VITAMIN B12: Vitamin B-12: 270

## 2022-08-23 LAB — IRON,TIBC AND FERRITIN PANEL
%SAT: 17.61
Ferritin: 33.3
Iron: 41
TIBC: 275
UIBC: 234

## 2022-08-25 ENCOUNTER — Encounter: Payer: Medicare Other | Admitting: Adult Health

## 2022-08-26 ENCOUNTER — Encounter: Payer: Self-pay | Admitting: Rehabilitation

## 2022-08-26 ENCOUNTER — Ambulatory Visit: Payer: Medicare Other | Admitting: Rehabilitation

## 2022-08-26 DIAGNOSIS — R2681 Unsteadiness on feet: Secondary | ICD-10-CM

## 2022-08-26 DIAGNOSIS — R293 Abnormal posture: Secondary | ICD-10-CM

## 2022-08-26 DIAGNOSIS — M6281 Muscle weakness (generalized): Secondary | ICD-10-CM

## 2022-08-26 DIAGNOSIS — R2689 Other abnormalities of gait and mobility: Secondary | ICD-10-CM

## 2022-08-26 NOTE — Therapy (Signed)
OUTPATIENT PHYSICAL THERAPY TREATMENT NOTE   Patient Name: Caitlin Sharp MRN: [id number] DOB:10/29/1950, 71 y.o., female Today's Date: 08/26/2022  PCP: Veleta Miners, MD REFERRING PROVIDER: Mechele Claude, PA-C  END OF SESSION:   PT End of Session - 08/26/22 0940     Visit Number 11    Number of Visits 17    Date for PT Re-Evaluation 09/07/22    Authorization Type [employer] Medicare (10th visit PN needed)    PT Start Time 0935    PT Stop Time 1018    PT Time Calculation (min) 43 min    Equipment Utilized During Treatment Gait belt    Activity Tolerance Patient tolerated treatment well    Behavior During Therapy WFL for tasks assessed/performed               Past Medical History:  Diagnosis Date   Abnormal thyroid blood test    Per Records recevied from [facility], Dr.Pearson   Acute deep vein thrombosis of right peroneal vein ([place]) 10/05/2020   RLE DVT   Anxiety    Arthritis    RA   Coronary artery disease    Diabetes type I ([place])    Per [facility] New Patient Packet   Diabetic retinopathy ([place])    Per [place] New Patient Packet   DKA (diabetic ketoacidosis) ([place]) 2021   Dr.William Zackowski.  Insulin Pump was out of Insulin   Glaucoma    Per [place] Patient Packet   H/O heart bypass surgery 2012   Dr. Luana Shu, Per [facility] New Patient Packet   Heart murmur    when I was a teenager   History of blood transfusion    History of kidney stones    passed   Hx of CABG    Per records received form [facility], Dr.Pearson    Left bundle branch block    Mild mitral regurgitation    Per records from [facility], [employer]    Neuropathy 1976   Per [place] Patient Packet   NSTEMI (non-ST elevated myocardial infarction) ([place]) 09/21/2020   NSTEMI/demand ischemic in setting of DKA, patent grafts by 09/24/20 [facility], continue medical therapy   Osteoporosis    T Score -2.7 , Per [place] New Patient Packet   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 due to  underlying condition [facility])    Per Records recevied from [facility], Dr.Pearson   PVD (peripheral vascular disease) ([place]) 10/27/2021   Type 1 diabetes mellitus with hyperglycemia ([place]) 10/28/2021   Past Surgical History:  Procedure Laterality Date   ABDOMINAL AORTOGRAM W/LOWER EXTREMITY N/A 10/02/2021   Procedure: ABDOMINAL AORTOGRAM W/LOWER EXTREMITY;  Surgeon: Broadus John, MD;  Location: [place] CV [facility];  Service: Cardiovascular;  Laterality: N/A;   AMPUTATION Right 04/17/2021   Procedure: RIGHT GREAT TOE AMPUTATION;  Surgeon: Newt Minion, MD;  Location: [place];  Service: Orthopedics;  Laterality: Right;   AMPUTATION Right 07/19/2021   Procedure: RIGHT FOOT 1ST RAY AMPUTATION;  Surgeon: Newt Minion, MD;  Location: [place];  Service: Orthopedics;  Laterality: Right;   AMPUTATION Right 02/06/2022   Procedure: AMPUTATION BELOW KNEE;  Surgeon: Wylene Simmer, MD;  Location: [place];  Service: Orthopedics;  Laterality: Right;  regional block   CESAREAN SECTION  1970   Dr. Moses Manners, Per [facility] New Patient Packet   CESAREAN SECTION  1973   Dr. 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 ([place]) 07/29/2022   Ischemic cardiomyopathy 07/29/2022   Moderate episode of recurrent major depressive disorder ([place]) 07/29/2022   Anemia of chronic disease 07/29/2022   S/P BKA (below knee amputation) unilateral, right ([place]) 07/29/2022   Daytime sleepiness 07/29/2022   Cognitive impairment 07/29/2022   Osteomyelitis ([place]) 02/06/2022   Osteomyelitis of right foot ([place]) 02/06/2022   Type 1 diabetes mellitus with hyperglycemia ([place]) 10/28/2021   PVD (peripheral vascular disease) ([place]) 10/27/2021   Osteomyelitis of great toe of right foot ([facility])    NSVT (nonsustained ventricular tachycardia) ([place]) 01/17/2021   Acute deep vein thrombosis (DVT) of right peroneal vein ([place])  10/17/2020   Insulin long-term use ([place]) 07/18/2020   Bruit of left carotid artery 10/31/2019   Coronary artery disease involving native coronary artery of native heart without angina pectoris 10/31/2019   Open-angle glaucoma of right eye 10/31/2019   Dry eyes 03/01/2018   Presence of intraocular lens 10/18/2015   Cystoid macular edema 06/14/2012    REFERRING DIAG: Z89.511 (ICD-10-CM) - Acquired absence of right leg below knee Z47.89 (ICD-10-CM) - Encounter for prosthetic gait training    THERAPY DIAG:  Unsteadiness on feet  Abnormal posture  Other abnormalities of gait and mobility  Muscle weakness (generalized)  Rationale for Evaluation and Treatment Rehabilitation  PERTINENT HISTORY: Hx of BKA to the right leg due to osteomyelitis 02/06/22. Worked with therapy and then transferred to assisted living. She has been a Type 1 DM for over 50 years. Used to be on a insulin pump now on lantus and meal coverage.  Recently saw endocrinology and they lowered her lantus as she voiced concern for hypoglycemia. A1C 8.6 on 04/22/22 which is improved  She keeps getting skin tears from hitting objects such as her WC She is awaiting a prosthesis from [employer].  Was treated for thigh cellulitis due to an infected skin tear with Doxycycline on 04/29/22.  She reports the area is much improved. Now has a skin tear to the LLE that has some mild yellow drainage and redness per nursing staff that is concerning.   PRECAUTIONS: Fall  SUBJECTIVE:                                                                                                                                                                                      SUBJECTIVE STATEMENT:   Pt has been wearing leg more consistently, wore for 5 hours while at grandson's wedding on Saturday.  Did a lot of walking at the wedding! No skin breakdown   PAIN:  Are you having  pain? No   OBJECTIVE: (objective measures completed at initial evaluation unless  otherwise dated)  TODAY'S TREATMENT:  CURRENT PROSTHETIC WEAR ASSESSMENT: Patient is independent with:  She is now able to assist some with doffing leg, donning socks; Pt able to release button and pull leg off by herself today!  Patient is dependent with: skin check, residual limb care, prosthetic cleaning, ply sock cleaning, correct ply sock adjustment, proper wear schedule/adjustment, proper weight-bearing schedule/adjustment Donning prosthesis: min A (to assist with aligning pin) Doffing prosthesis: S Prosthetic wear tolerance:  Pt is wearing 6 hours a day more consistently  Prosthetic weight bearing tolerance: approx 10-15 minutes at a time Edema: none noted Residual limb condition: Skin is intact today. Skin on most distal-lateral portion of limb appears dry and mildly flaky. Prosthetic description: Pin lock suspension K code/activity level with prosthetic use: Level 3  Pt presents today with vivewear under liner, and 5 ply sock total. This seemed to be appropriate amount of socks during this session.    GAIT: Gait pattern: step to pattern, step through pattern, decreased stride length, lateral hip instability, trunk flexed, and narrow BOS Distance walked: 115'  Assistive device utilized: Environmental consultant - 2 wheeled Level of assistance: SBA  Comments: Decreased heel strike.  Still having some continued R knee flexion but this did improve with continued gait and exercise. No excessive sinking in socket noted during ambulation.  Provided external cues to improve step size and BOS.    STAIRS:  Level of Assistance: CGA and Min A  Stair Negotiation Technique: Step to Pattern Sideways with Single Rail on Right  Number of Stairs: 4   Height of Stairs: 6  Comments: Performed stairs as she would at her son's house if ramp removed.  She is able to ascend and descend with R rail with min A with cues for ensuring safe foot/prosthetic placement.  Educated son that she is safe to do stairs with assist  from son as needed at their house.    Spent a good amount of time in session on mat for LE flexibility.  Pt able to get into prone today with feet off EOM to allow more hamstring stretch with hip flex stretch.  PT attempted to flex R knee however pt reports pain in knee in this position.  Had get get prone on elbows (lifting chest slightly) x 10-15 secs at a time.  Then had her lie on L side and PT manually provided R hip flex stretch x 30 secs x 2 sets.  Ended with supine R hip flex stretch with R LE off EOM x 1 min.  Provided this for HEP.     PATIENT EDUCATED ON FOLLOWING PROSTHETIC CARE: Prosthetic wear tolerance: 4 hours/day, 7 days/week; Discussed progressing towards 6 hours per day Prosthetic weight bearing tolerance: 2-3 minutes; Re-iterated adjusting tolerance to 4-5 minutes before rest Other education  Skin check, Residual limb care, Care of non-amputated limb, Prosthetic cleaning, Ply sock cleaning, Correct ply sock adjustment, [place]pper donning, Proper doffing, Proper wear schedule/adjustment, and Proper weight-bearing schedule/adjustment    PATIENT EDUCATION:  Continue with HEP and walking as able   HOME EXERCISE PROGRAM:  Access Code: [email or web address] URL: [email or web address] Date: 08/05/2022 Prepared by: Cameron Sprang  Exercises - Side Stepping with Counter Support  - 1 x daily - 7 x weekly - 2 sets - 10 reps - Standing Hip Abduction with Counter Support  - 1 x daily - 7 x weekly - 2 sets - 10 reps - Standing  Thoracic and Cervical Rotation with Reach at Wall  - 1 x daily - 7 x weekly - 2 sets - 10 reps  INSTRUCTIONS FOR CNAS ON DONNING/DOFFING PROSTHESIS:   1:  WHEN WEARING PROSTHESIS, DON SNUG (SMALLER) BLACK CUT OFF SOCK FIRST UNDER SILICONE LINER.  HAVE HER SCOOT TO EDGE OF CHAIR.  THIS SHOULD BE DIRECTLY ON SKIN.  IF SHE IS HAVING BLEEDING OR IT GETS SOILED, SWITCH TO HER OTHER BLACK CUT OFF SOCK.  WHEN NOT WEARING PROSTHESIS, WEAR BLACK CUT OFF SOCK THAT IS  LARGER WITH GRAY SHRINKER WITH RING.    2: INVERT LINER AND ENSURE THAT END IS AS FLAT AS POSSIBLE AND ENSURE PIN IS IN LINE WITH BONE.     3: WHEN DONNING WHITE SOCKS, HAVE HER TRY AND BUNDLE UP AND PLACE MIDDLE FINGERS IN HOLE SO THAT SHE KNOWS WHERE HOLE IS WHEN PUTTING ON.     3: PLACE PROSTHESIS ON FLOOR AND TIP BACK ON HEEL, HAVE HER PLACE LIMB INSIDE TO HEAR AT LEAST ONE CLICK PRIOR TO STANDING.  WORK ON HAVING HER PUSH HER KNEE DOWN INTO SOCKET.    4:  CAN PLACE SMALL DOT OF BABY/MINERAL OIL ON KNEE CAP AND RUB UPWARD TO PREVENT FRICTION RUBBING FROM LINER.    5: BEGIN WEARING PROSTHESIS 2 HOURS ON THEN DO 2 HOURS OFF, THEN 2 HOURS ON (CAN DO 2-3 TIMES A DAY LIKE THIS).  AFTER A WEEK, IF SKIN STILL LOOKS GOOD/HEALING, INCREASE TO 3 HOURS ON, 2 HOURS OFF, 3 HOURS ON.  (CAN DO 2-3 TIMES PER DAY AS WELL).          ASSESSMENT:   CLINICAL IMPRESSION: Skilled session continues to focus on her being independent with donning/doffing prosthesis.  She is able to doff at S level today but needs min A to line up pin when donning liner.  She continues to make progress with gait and stairs in session.  She also was able to ambulate in and out of clinic today with son!   OBJECTIVE IMPAIRMENTS Abnormal gait, decreased activity tolerance, decreased balance, decreased endurance, decreased knowledge of use of DME, decreased mobility, difficulty walking, decreased ROM, decreased strength, impaired perceived functional ability, impaired flexibility, postural dysfunction, and prosthetic dependency .    ACTIVITY LIMITATIONS carrying, lifting, bending, standing, squatting, stairs, transfers, bathing, toileting, dressing, reach over head, hygiene/grooming, and locomotion level   PARTICIPATION LIMITATIONS: cleaning, laundry, driving, shopping, and community activity   PERSONAL FACTORS 3+ comorbidities: see above  are also affecting patient's functional outcome.    REHAB POTENTIAL: Good   CLINICAL  DECISION MAKING: Evolving/moderate complexity   EVALUATION COMPLEXITY: Moderate     GOALS: Goals reviewed with patient? Yes   SHORT TERM GOALS: Target date: 08/06/2022     Pt/caregiver will be IND with initial HEP in order to indicate improved functional mobility and dec fall risk. Baseline: Goal status: MET    2.  Pt will be able to don/doff prosthesis at min A level and tolerate 6 hours of wear time per day without skin break down. Baseline: mod/max A at times  Goal status: Progressing    3.  Pt will tolerate standing x 5 mins with intermittent support at S level in order to indicate improved independence with ADLs.  Baseline: Can stand up to 2 mins without support  Goal status: Progressing    4.  Will assess BERG balance when able and set LTG to reflect decreased fall risk.  Baseline: (08/12/2022) 27/56 Goal status:  MET   5.  Will assess gait speed and set LTG to reflect progress.  Baseline: 1.29 ft/sec (08/12/2022) Goal status: MET   6.  Pt will ambulate x 100' with RW and prosthesis at min A level in order to indicate improved household mobility.  Baseline: 115' Goal status: MET    LONG TERM GOALS: Target date: 09/07/2022     Pt will be IND with final HEP in order to indicate improved functional mobility and dec fall risk. Baseline:  Goal status: INITIAL   2.  Pt will ambulate x 150' indoors and 300' outdoors over unlevel paved surfaces with RW at S level in order to indicate improved household and community mobility.  Baseline:  Goal status: INITIAL   3.  Pt will negotiate up/down curb/ramp with RW and 4 steps with rails at S level in order to indicate improved community access.   Baseline:  Goal status: INITIAL   4.  Pt will tolerate standing x 10 mins with intermittent UE support while performing ADLs in order to indicate improved household independence.  Baseline:  Goal status: INITIAL   5.  Pt will increase BERG balance score to >/=40/56 to demonstrate  improved static balance.  Baseline: 27/56 (11/21) Goal status: INITIAL   6.  Pt will demonstrate a gait speed of >/=2.0 feet/sec in order to decrease risk for falls. Baseline: 1.29 ft/sec w/ RW (11/21) Goal status: INITIAL   7.  Pt will don/doff prosthesis at S level and tolerate wearing prosthesis >/=90% of awake time in order to indicate more independent prosthetic care.   Baseline:  Goal status: INITIAL     PLAN: PT FREQUENCY: 2x/week   PT DURATION: 8 weeks   PLANNED INTERVENTIONS: Therapeutic exercises, Therapeutic activity, Neuromuscular re-education, Balance training, Gait training, Patient/Family education, Self Care, Joint mobilization, Stair training, Vestibular training, Prosthetic training, DME instructions, and Manual therapy   PLAN FOR NEXT SESSION: Is she wearing 6 hours total per day?? How is hip stretch going? Add to HEP if appropriate. Continue to check skin.  Continue prosthetic education.  Prosthetic gait, balance, SLS tasks.  have her work on donning/doffing prosthesis.  Add to standing exercises.    Cameron Sprang, PT, MPT [facility] [address] [place], [place], [address] Phone: [phone]   Fax:  [phone] 08/26/22, 12:19 PM

## 2022-08-28 ENCOUNTER — Ambulatory Visit: Payer: Medicare Other | Admitting: Rehabilitation

## 2022-08-28 ENCOUNTER — Encounter: Payer: Self-pay | Admitting: Rehabilitation

## 2022-08-28 DIAGNOSIS — R293 Abnormal posture: Secondary | ICD-10-CM

## 2022-08-28 DIAGNOSIS — M6281 Muscle weakness (generalized): Secondary | ICD-10-CM

## 2022-08-28 DIAGNOSIS — R2681 Unsteadiness on feet: Secondary | ICD-10-CM | POA: Diagnosis not present

## 2022-08-28 DIAGNOSIS — R2689 Other abnormalities of gait and mobility: Secondary | ICD-10-CM

## 2022-08-28 NOTE — Therapy (Signed)
OUTPATIENT PHYSICAL THERAPY TREATMENT NOTE   Patient Name: Caitlin Sharp MRN: [id number] DOB:12-31-1950, 71 y.o., female Today's Date: 08/28/2022  PCP: Veleta Miners, MD REFERRING PROVIDER: Mechele Claude, PA-C  END OF SESSION:   PT End of Session - 08/28/22 0937     Visit Number 12    Number of Visits 17    Date for PT Re-Evaluation 09/07/22    Authorization Type [employer] Medicare (10th visit PN needed)    PT Start Time 0934    PT Stop Time 1017    PT Time Calculation (min) 43 min    Equipment Utilized During Treatment Gait belt    Activity Tolerance Patient tolerated treatment well    Behavior During Therapy WFL for tasks assessed/performed               Past Medical History:  Diagnosis Date   Abnormal thyroid blood test    Per Records recevied from [facility], Dr.Pearson   Acute deep vein thrombosis of right peroneal vein ([place]) 10/05/2020   RLE DVT   Anxiety    Arthritis    RA   Coronary artery disease    Diabetes type I ([place])    Per [facility] New Patient Packet   Diabetic retinopathy ([place])    Per [facility] New Patient Packet   DKA (diabetic ketoacidosis) ([place]) 2021   Dr.William Zackowski.  Insulin Pump was out of Insulin   Glaucoma    Per [facility] Patient Packet   H/O heart bypass surgery 2012   Dr. Luana Shu, Per [facility] New Patient Packet   Heart murmur    when I was a teenager   History of blood transfusion    History of kidney stones    passed   Hx of CABG    Per records received form [facility], Dr.Pearson    Left bundle branch block    Mild mitral regurgitation    Per records from [facility], [place]    Neuropathy 1976   Per [facility] Patient Packet   NSTEMI (non-ST elevated myocardial infarction) ([place]) 09/21/2020   NSTEMI/demand ischemic in setting of DKA, patent grafts by 09/24/20 [facility], continue medical therapy   Osteoporosis    T Score -2.7 , Per [facility] New Patient Packet   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 due to  underlying condition [facility])    Per Records recevied from [facility], Dr.Pearson   PVD (peripheral vascular disease) ([place]) 10/27/2021   Type 1 diabetes mellitus with hyperglycemia ([place]) 10/28/2021   Past Surgical History:  Procedure Laterality Date   ABDOMINAL AORTOGRAM W/LOWER EXTREMITY N/A 10/02/2021   Procedure: ABDOMINAL AORTOGRAM W/LOWER EXTREMITY;  Surgeon: Broadus John, MD;  Location: [place] [facility];  Service: Cardiovascular;  Laterality: N/A;   AMPUTATION Right 04/17/2021   Procedure: RIGHT GREAT TOE AMPUTATION;  Surgeon: Newt Minion, MD;  Location: [facility];  Service: Orthopedics;  Laterality: Right;   AMPUTATION Right 07/19/2021   Procedure: RIGHT FOOT 1ST RAY AMPUTATION;  Surgeon: Newt Minion, MD;  Location: [facility];  Service: Orthopedics;  Laterality: Right;   AMPUTATION Right 02/06/2022   Procedure: AMPUTATION BELOW KNEE;  Surgeon: Wylene Simmer, MD;  Location: [facility] [place];  Service: Orthopedics;  Laterality: Right;  regional block   CESAREAN SECTION  1970   Dr. Moses Manners, Per [facility] New Patient Packet   CESAREAN SECTION  1973   Dr. 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 ([place]) 07/29/2022   Ischemic cardiomyopathy 07/29/2022   Moderate episode of recurrent major depressive disorder ([place]) 07/29/2022   Anemia of chronic disease 07/29/2022   S/P BKA (below knee amputation) unilateral, right ([place]) 07/29/2022   Daytime sleepiness 07/29/2022   Cognitive impairment 07/29/2022   Osteomyelitis ([place]) 02/06/2022   Osteomyelitis of right foot ([place]) 02/06/2022   Type 1 diabetes mellitus with hyperglycemia ([place]) 10/28/2021   PVD (peripheral vascular disease) ([place]) 10/27/2021   Osteomyelitis of great toe of right foot ([facility])    NSVT (nonsustained ventricular tachycardia) ([place]) 01/17/2021   Acute deep vein thrombosis (DVT) of right peroneal vein ([place])  10/17/2020   Insulin long-term use ([place]) 07/18/2020   Bruit of left carotid artery 10/31/2019   Coronary artery disease involving native coronary artery of native heart without angina pectoris 10/31/2019   Open-angle glaucoma of right eye 10/31/2019   Dry eyes 03/01/2018   Presence of intraocular lens 10/18/2015   Cystoid macular edema 06/14/2012    REFERRING DIAG: Z89.511 (ICD-10-CM) - Acquired absence of right leg below knee Z47.89 (ICD-10-CM) - Encounter for prosthetic gait training    THERAPY DIAG:  Unsteadiness on feet  Abnormal posture  Other abnormalities of gait and mobility  Muscle weakness (generalized)  Rationale for Evaluation and Treatment Rehabilitation  PERTINENT HISTORY: Hx of BKA to the right leg due to osteomyelitis 02/06/22. Worked with therapy and then transferred to assisted living. She has been a Type 1 DM for over 50 years. Used to be on a insulin pump now on lantus and meal coverage.  Recently saw endocrinology and they lowered her lantus as she voiced concern for hypoglycemia. A1C 8.6 on 04/22/22 which is improved  She keeps getting skin tears from hitting objects such as her WC She is awaiting a prosthesis from [facility].  Was treated for thigh cellulitis due to an infected skin tear with Doxycycline on 04/29/22.  She reports the area is much improved. Now has a skin tear to the LLE that has some mild yellow drainage and redness per nursing staff that is concerning.   PRECAUTIONS: Fall  SUBJECTIVE:                                                                                                                                                                                      SUBJECTIVE STATEMENT:   Only wore leg for 3 hours on Tuesday after seeing me and didn't wear very much yesterday.  She reports new skin tear on bottom of residual limb.   PAIN:  Are you having pain?  No   OBJECTIVE: (objective measures completed at initial evaluation unless otherwise  dated)  TODAY'S TREATMENT:  CURRENT PROSTHETIC WEAR ASSESSMENT: Patient is independent with:  She is now able to assist some with doffing leg, donning socks; Pt able to release button and pull leg off by herself. (PT assisted today for time constraint to assess skin tear)  Patient is dependent with: skin check, residual limb care, prosthetic cleaning, ply sock cleaning, correct ply sock adjustment, proper wear schedule/adjustment, proper weight-bearing schedule/adjustment Donning prosthesis: min A (to assist with aligning pin) Doffing prosthesis: S Prosthetic wear tolerance:  Pt is wearing 6 hours a day more consistently  Prosthetic weight bearing tolerance: approx 10-15 minutes at a time Edema: none noted Residual limb condition: Has new small skin tear on R end of residual limb measuring approx 109m x 3 mm.  There was a bandaid donned.  PT continued to provide education on NOT using bandaid and to allow vivewear sock be directly on any wounds to aid in healing.   Prosthetic description: Pin lock suspension K code/activity level with prosthetic use: Level 3  Pt presents today with vivewear under liner, and 5 ply sock total. This seemed to be appropriate amount of socks during this session.    GAIT: Gait pattern: step to pattern, step through pattern, decreased stride length, lateral hip instability, trunk flexed, and narrow BOS Distance walked: 25' x 2 to and from nustep and // bars.   Assistive device utilized: Walker - 2 wheeled Level of assistance: SBA  Comments: Decreased heel strike.  Still having some continued R knee flexion but this did improve with continued gait and exercise. No excessive sinking in socket noted during ambulation.  Provided external cues to improve step size and BOS.    Seated nustep x 6 mins at level 4 resistance with continued cuing for "pushing" legs into more extension throughout.  Pt did keep stopping with conversation from PT, so cues to continue with task.  PT did ask if her facility had exercise equipment and she reports they do. I feel this would be good for her to do if she is allowed/has assist to get set up.    NMR in // bars:  Standing on foam airex with feet apart, maintaining balance x 20 secs (2 sets) without UE support.  Forward stepping with cues for prosthetic placement x 10 reps with UE support.  Marching in place on airex x 20 reps with cues for upright posture and forward gaze.  Forward walking in // bars with L side only to work on improved R lateral weight shift and prosthetic WB x 4 laps. She continues to have VERY narrow BOS therefore added beam to middle of // bars for visual aid in keeping wider BOS.  She still needs max cuing to avoid stepping right against board esp with LLE.    PATIENT EDUCATED ON FOLLOWING PROSTHETIC CARE: Prosthetic wear tolerance: 4 hours/day, 7 days/week; Discussed progressing towards 6 hours per day Prosthetic weight bearing tolerance: 10-15 mins Other education  Skin check, Residual limb care, Care of non-amputated limb, Prosthetic cleaning, Ply sock cleaning, Correct ply sock adjustment, [place] donning, Proper doffing, Proper wear schedule/adjustment, and Proper weight-bearing schedule/adjustment    PATIENT EDUCATION:  Continue with HEP and walking as able   HOME EXERCISE PROGRAM:  Access Code: [email or web address] URL: [email or web address] Date: 08/05/2022 Prepared by: ECameron Sprang Exercises - Side Stepping with Counter Support  - 1 x daily - 7 x weekly - 2 sets -  10 reps - Standing Hip Abduction with Counter Support  - 1 x daily - 7 x weekly - 2 sets - 10 reps - Standing Thoracic and Cervical Rotation with Reach at Wall  - 1 x daily - 7 x weekly - 2 sets - 10 reps  INSTRUCTIONS FOR CNAS ON DONNING/DOFFING PROSTHESIS:   1:  WHEN WEARING PROSTHESIS, DON SNUG (SMALLER) BLACK CUT OFF SOCK FIRST UNDER SILICONE LINER.  HAVE HER SCOOT TO EDGE OF CHAIR.  THIS SHOULD BE DIRECTLY ON SKIN.  IF SHE  IS HAVING BLEEDING OR IT GETS SOILED, SWITCH TO HER OTHER BLACK CUT OFF SOCK.  WHEN NOT WEARING PROSTHESIS, WEAR BLACK CUT OFF SOCK THAT IS LARGER WITH GRAY SHRINKER WITH RING.    2: INVERT LINER AND ENSURE THAT END IS AS FLAT AS POSSIBLE AND ENSURE PIN IS IN LINE WITH BONE.     3: WHEN DONNING WHITE SOCKS, HAVE HER TRY AND BUNDLE UP AND PLACE MIDDLE FINGERS IN HOLE SO THAT SHE KNOWS WHERE HOLE IS WHEN PUTTING ON.     3: PLACE PROSTHESIS ON FLOOR AND TIP BACK ON HEEL, HAVE HER PLACE LIMB INSIDE TO HEAR AT LEAST ONE CLICK PRIOR TO STANDING.  WORK ON HAVING HER PUSH HER KNEE DOWN INTO SOCKET.    4:  CAN PLACE SMALL DOT OF BABY/MINERAL OIL ON KNEE CAP AND RUB UPWARD TO PREVENT FRICTION RUBBING FROM LINER.    5: BEGIN WEARING PROSTHESIS 2 HOURS ON THEN DO 2 HOURS OFF, THEN 2 HOURS ON (CAN DO 2-3 TIMES A DAY LIKE THIS).  AFTER A WEEK, IF SKIN STILL LOOKS GOOD/HEALING, INCREASE TO 3 HOURS ON, 2 HOURS OFF, 3 HOURS ON.  (CAN DO 2-3 TIMES PER DAY AS WELL).          ASSESSMENT:   CLINICAL IMPRESSION: Skilled session focused on BLE strengthening with warm up on nustep which she tolerated well today.  Feel that she could do this at her facility with assist for set up.  Also continue to work on balance with emphasis on improved R lateral weight shift and WB along with better quality of gait.     OBJECTIVE IMPAIRMENTS Abnormal gait, decreased activity tolerance, decreased balance, decreased endurance, decreased knowledge of use of DME, decreased mobility, difficulty walking, decreased ROM, decreased strength, impaired perceived functional ability, impaired flexibility, postural dysfunction, and prosthetic dependency .    ACTIVITY LIMITATIONS carrying, lifting, bending, standing, squatting, stairs, transfers, bathing, toileting, dressing, reach over head, hygiene/grooming, and locomotion level   PARTICIPATION LIMITATIONS: cleaning, laundry, driving, shopping, and community activity   PERSONAL FACTORS  3+ comorbidities: see above  are also affecting patient's functional outcome.    REHAB POTENTIAL: Good   CLINICAL DECISION MAKING: Evolving/moderate complexity   EVALUATION COMPLEXITY: Moderate     GOALS: Goals reviewed with patient? Yes   SHORT TERM GOALS: Target date: 08/06/2022     Pt/caregiver will be IND with initial HEP in order to indicate improved functional mobility and dec fall risk. Baseline: Goal status: MET    2.  Pt will be able to don/doff prosthesis at min A level and tolerate 6 hours of wear time per day without skin break down. Baseline: mod/max A at times  Goal status: Progressing    3.  Pt will tolerate standing x 5 mins with intermittent support at S level in order to indicate improved independence with ADLs.  Baseline: Can stand up to 2 mins without support  Goal status: Progressing  4.  Will assess BERG balance when able and set LTG to reflect decreased fall risk.  Baseline: (08/12/2022) 27/56 Goal status: MET   5.  Will assess gait speed and set LTG to reflect progress.  Baseline: 1.29 ft/sec (08/12/2022) Goal status: MET   6.  Pt will ambulate x 100' with RW and prosthesis at min A level in order to indicate improved household mobility.  Baseline: 115' Goal status: MET    LONG TERM GOALS: Target date: 09/07/2022     Pt will be IND with final HEP in order to indicate improved functional mobility and dec fall risk. Baseline:  Goal status: INITIAL   2.  Pt will ambulate x 150' indoors and 300' outdoors over unlevel paved surfaces with RW at S level in order to indicate improved household and community mobility.  Baseline:  Goal status: INITIAL   3.  Pt will negotiate up/down curb/ramp with RW and 4 steps with rails at S level in order to indicate improved community access.   Baseline:  Goal status: INITIAL   4.  Pt will tolerate standing x 10 mins with intermittent UE support while performing ADLs in order to indicate improved household  independence.  Baseline:  Goal status: INITIAL   5.  Pt will increase BERG balance score to >/=40/56 to demonstrate improved static balance.  Baseline: 27/56 (11/21) Goal status: INITIAL   6.  Pt will demonstrate a gait speed of >/=2.0 feet/sec in order to decrease risk for falls. Baseline: 1.29 ft/sec w/ RW (11/21) Goal status: INITIAL   7.  Pt will don/doff prosthesis at S level and tolerate wearing prosthesis >/=90% of awake time in order to indicate more independent prosthetic care.   Baseline:  Goal status: INITIAL     PLAN: PT FREQUENCY: 2x/week   PT DURATION: 8 weeks   PLANNED INTERVENTIONS: Therapeutic exercises, Therapeutic activity, Neuromuscular re-education, Balance training, Gait training, Patient/Family education, Self Care, Joint mobilization, Stair training, Vestibular training, Prosthetic training, DME instructions, and Manual therapy   PLAN FOR NEXT SESSION: Is she wearing 6 hours total per day?? How is hip stretch going? Add to HEP if appropriate. Continue to check skin.  Continue prosthetic education.  Prosthetic gait, balance, SLS tasks.  have her work on donning/doffing prosthesis.  Add to standing exercises.    Cameron Sprang, PT, MPT [facility] [address] [place], [place], [address] Phone: [phone]   Fax:  [phone] 08/28/22, 10:51 AM

## 2022-09-01 ENCOUNTER — Telehealth: Payer: Self-pay | Admitting: Internal Medicine

## 2022-09-01 NOTE — Telephone Encounter (Signed)
Patients CBGS are running more then 500 in the morning She has refused to take her Sliding scale coverage sometimes and also her Lantus was decreased because she had few Low CBGS Will add Lantus 5 units in the evening to help her CBGS in the morning Also Add Novolog 14 units in sliding scale for CBGS more then 500

## 2022-09-02 ENCOUNTER — Encounter: Payer: Self-pay | Admitting: Rehabilitation

## 2022-09-02 ENCOUNTER — Ambulatory Visit: Payer: Medicare Other | Admitting: Rehabilitation

## 2022-09-02 DIAGNOSIS — R2681 Unsteadiness on feet: Secondary | ICD-10-CM | POA: Diagnosis not present

## 2022-09-02 DIAGNOSIS — R293 Abnormal posture: Secondary | ICD-10-CM

## 2022-09-02 DIAGNOSIS — M6281 Muscle weakness (generalized): Secondary | ICD-10-CM

## 2022-09-02 DIAGNOSIS — R2689 Other abnormalities of gait and mobility: Secondary | ICD-10-CM

## 2022-09-02 NOTE — Therapy (Signed)
OUTPATIENT PHYSICAL THERAPY TREATMENT NOTE   Patient Name: Caitlin Sharp MRN: [id number] DOB:10-Oct-1950, 71 y.o., female Today's Date: 09/02/2022  PCP: Veleta Miners, MD REFERRING PROVIDER: Mechele Claude, PA-C  END OF SESSION:   PT End of Session - 09/02/22 0946     Visit Number 13    Number of Visits 17    Date for PT Re-Evaluation 09/07/22    Authorization Type [employer] Medicare (10th visit PN needed)    PT Start Time 0937    PT Stop Time 1020    PT Time Calculation (min) 43 min    Equipment Utilized During Treatment Gait belt    Activity Tolerance Patient tolerated treatment well    Behavior During Therapy WFL for tasks assessed/performed               Past Medical History:  Diagnosis Date   Abnormal thyroid blood test    Per Records recevied from [facility], Dr.Pearson   Acute deep vein thrombosis of right peroneal vein ([place]) 10/05/2020   RLE DVT   Anxiety    Arthritis    RA   Coronary artery disease    Diabetes type I ([place])    Per [facility] New Patient Packet   Diabetic retinopathy ([place])    Per [place] New Patient Packet   DKA (diabetic ketoacidosis) ([place]) 2021   Dr.William Zackowski.  Insulin Pump was out of Insulin   Glaucoma    Per [place] Patient Packet   H/O heart bypass surgery 2012   Dr. Luana Shu, Per [facility] New Patient Packet   Heart murmur    when I was a teenager   History of blood transfusion    History of kidney stones    passed   Hx of CABG    Per records received form [facility], Dr.Pearson    Left bundle branch block    Mild mitral regurgitation    Per records from [facility], [place]    Neuropathy 1976   Per [place] Patient Packet   NSTEMI (non-ST elevated myocardial infarction) ([place]) 09/21/2020   NSTEMI/demand ischemic in setting of DKA, patent grafts by 09/24/20 [facility], continue medical therapy   Osteoporosis    T Score -2.7 , Per 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 due to  underlying condition [facility])    Per Records recevied from [facility], Dr.Pearson   PVD (peripheral vascular disease) ([place]) 10/27/2021   Type 1 diabetes mellitus with hyperglycemia ([place]) 10/28/2021   Past Surgical History:  Procedure Laterality Date   ABDOMINAL AORTOGRAM W/LOWER EXTREMITY N/A 10/02/2021   Procedure: ABDOMINAL AORTOGRAM W/LOWER EXTREMITY;  Surgeon: Broadus John, MD;  Location: [place] [facility];  Service: Cardiovascular;  Laterality: N/A;   AMPUTATION Right 04/17/2021   Procedure: RIGHT GREAT TOE AMPUTATION;  Surgeon: Newt Minion, MD;  Location: [place];  Service: Orthopedics;  Laterality: Right;   AMPUTATION Right 07/19/2021   Procedure: RIGHT FOOT 1ST RAY AMPUTATION;  Surgeon: Newt Minion, MD;  Location: [place];  Service: Orthopedics;  Laterality: Right;   AMPUTATION Right 02/06/2022   Procedure: AMPUTATION BELOW KNEE;  Surgeon: Wylene Simmer, MD;  Location: [place];  Service: Orthopedics;  Laterality: Right;  regional block   CESAREAN SECTION  1970   Dr. Moses Manners, Per [facility] New Patient Packet   CESAREAN SECTION  1973   Dr. 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 ([place]) 07/29/2022   Ischemic cardiomyopathy 07/29/2022   Moderate episode of recurrent major depressive disorder ([place]) 07/29/2022   Anemia of chronic disease 07/29/2022   S/P BKA (below knee amputation) unilateral, right ([place]) 07/29/2022   Daytime sleepiness 07/29/2022   Cognitive impairment 07/29/2022   Osteomyelitis ([place]) 02/06/2022   Osteomyelitis of right foot ([place]) 02/06/2022   Type 1 diabetes mellitus with hyperglycemia ([place]) 10/28/2021   PVD (peripheral vascular disease) ([place]) 10/27/2021   Osteomyelitis of great toe of right foot ([facility])    NSVT (nonsustained ventricular tachycardia) ([place]) 01/17/2021   Acute deep vein thrombosis (DVT) of right peroneal vein ([place])  10/17/2020   Insulin long-term use ([place]) 07/18/2020   Bruit of left carotid artery 10/31/2019   Coronary artery disease involving native coronary artery of native heart without angina pectoris 10/31/2019   Open-angle glaucoma of right eye 10/31/2019   Dry eyes 03/01/2018   Presence of intraocular lens 10/18/2015   Cystoid macular edema 06/14/2012    REFERRING DIAG: Z89.511 (ICD-10-CM) - Acquired absence of right leg below knee Z47.89 (ICD-10-CM) - Encounter for prosthetic gait training    THERAPY DIAG:  Unsteadiness on feet  Abnormal posture  Other abnormalities of gait and mobility  Muscle weakness (generalized)  Rationale for Evaluation and Treatment Rehabilitation  PERTINENT HISTORY: Hx of BKA to the right leg due to osteomyelitis 02/06/22. Worked with therapy and then transferred to assisted living. She has been a Type 1 DM for over 50 years. Used to be on a insulin pump now on lantus and meal coverage.  Recently saw endocrinology and they lowered her lantus as she voiced concern for hypoglycemia. A1C 8.6 on 04/22/22 which is improved  She keeps getting skin tears from hitting objects such as her WC She is awaiting a prosthesis from [facility].  Was treated for thigh cellulitis due to an infected skin tear with Doxycycline on 04/29/22.  She reports the area is much improved. Now has a skin tear to the LLE that has some mild yellow drainage and redness per nursing staff that is concerning.   PRECAUTIONS: Fall  SUBJECTIVE:                                                                                                                                                                                      SUBJECTIVE STATEMENT:   Has been wearing leg more consistently but not actually timing.    PAIN:  Are you having pain? No   OBJECTIVE: (objective measures completed at initial evaluation unless otherwise dated)  TODAY'S TREATMENT:  CURRENT PROSTHETIC WEAR ASSESSMENT: Patient is  independent with:  She is now able to assist some with doffing leg, donning socks;  Patient is dependent with: skin check, residual limb care, prosthetic cleaning, ply sock cleaning, correct ply sock adjustment, proper wear schedule/adjustment, proper weight-bearing schedule/adjustment Donning prosthesis: min A (to assist with aligning pin) Doffing prosthesis: S Prosthetic wear tolerance:  Pt is wearing 6 hours a day more consistently (wore about 6 hours Saturday and Sunday per patient report, but did not wear yesterday due to wound on leg.). Prosthetic weight bearing tolerance: approx 10-15 minutes at a time Edema: none noted Residual limb condition: Pts son reports that she has same skin tear/wound on end of residual limb.  He had taken a picture of it and it seemed larger than previous session with this PT.  PT reassessed in our session and it was closed and looked much better.  Pt reports she has been doing vivewear and left off completely yesterday to allow air.  Continue to educate on healing properties of vivewear shrinker.  Provided education on use of lotion at night and using damp wash cloth to gentle clean off in the am prior to donning leg due to pts dry/flaky skin.  Pt and son verbalized understanding.   Prosthetic description: Pin lock suspension K code/activity level with prosthetic use: Level 3  Pt presents today with vivewear under liner, and 1 ply sock total.We increased to 5 ply total in session.   GAIT: Gait pattern: step to pattern, step through pattern, decreased stride length, lateral hip instability, trunk flexed, and narrow BOS Distance walked: 100' into clinic to scitfit and another 115' x 2 reps in session for improved quality and distance.  Assistive device utilized: Walker - 2 wheeled Level of assistance: SBA  Comments: Decreased heel strike.  Still having some continued R knee flexion but this did improve with continued gait and exercise. Continues to need cues for  wider BOS, upright posture, and working to increase R knee extension in stance.     Seated scifit x 8 mins at level 2 resistance with LEs only today for improved extension strengthening.  Pt tolerated well with only moderate fatigue.    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  PT provided education on foot placement and posture throughout to ensure R foot gets closer to floor (still remains in flexed position when not cued or forced into extension). Performed x 10 reps.  Also performed lateral weight shifting in between reps of hip abd to get appropriate R lateral weight shift prior to moving LLE.  Performed x 10 reps and also LLE retro stepping x 10 reps, all for improved RLE flexibility and WB.  We did add a 5 ply sock (removed 1 ply) during session for better fit.    Therex:  Seated R hamstring stretch with RLE on bed without prosthesis x 3 sets of 30 secs.  Supine hip flex stretch as in HEP, see below.  2 sets of 2 mins performed in session.    PATIENT EDUCATED ON FOLLOWING PROSTHETIC CARE: Prosthetic wear tolerance: 4 hours/day, 7 days/week; Discussed progressing towards 6 hours per day, continues to be somewhat inconsistent Prosthetic weight bearing tolerance: 10-15 mins Other education  Skin check, Residual limb care, Care of non-amputated limb, Prosthetic cleaning, Ply sock cleaning, Correct  ply sock adjustment, [place] donning, Proper doffing, Proper wear schedule/adjustment, and Proper weight-bearing schedule/adjustment    PATIENT EDUCATION:  Continue with HEP and walking as able   HOME EXERCISE PROGRAM:  Access Code: [email or web address] URL: [email or web address] Date: 09/02/2022 Prepared by: Cameron Sprang  Exercises - Side Stepping with Counter Support  - 1 x daily - 7 x weekly - 2 sets - 10 reps - Standing Hip  Abduction with Counter Support  - 1 x daily - 7 x weekly - 2 sets - 10 reps - Standing Thoracic and Cervical Rotation with Reach at [place]  - 1 x daily - 7 x weekly - 2 sets - 10 reps - Supine Hip Flexor Stretch with Weight  - 1 x daily - 7 x weekly - 1 sets - 2-3 reps - 30-60 secs hold - Seated Table Hamstring Stretch  - 1 x daily - 7 x weekly - 1 sets - 3 reps - 30 hold  Performed bolded exercises 12/12  INSTRUCTIONS FOR CNAS ON DONNING/DOFFING PROSTHESIS:   1:  WHEN WEARING PROSTHESIS, DON SNUG (SMALLER) BLACK CUT OFF SOCK FIRST UNDER SILICONE LINER.  HAVE HER SCOOT TO EDGE OF CHAIR.  THIS SHOULD BE DIRECTLY ON SKIN.  IF SHE IS HAVING BLEEDING OR IT GETS SOILED, SWITCH TO HER OTHER BLACK CUT OFF SOCK.  WHEN NOT WEARING PROSTHESIS, WEAR BLACK CUT OFF SOCK THAT IS LARGER WITH GRAY SHRINKER WITH RING.    2: INVERT LINER AND ENSURE THAT END IS AS FLAT AS POSSIBLE AND ENSURE PIN IS IN LINE WITH BONE.     3: WHEN DONNING WHITE SOCKS, HAVE HER TRY AND BUNDLE UP AND PLACE MIDDLE FINGERS IN HOLE SO THAT SHE KNOWS WHERE HOLE IS WHEN PUTTING ON.     3: PLACE PROSTHESIS ON FLOOR AND TIP BACK ON HEEL, HAVE HER PLACE LIMB INSIDE TO HEAR AT LEAST ONE CLICK PRIOR TO STANDING.  WORK ON HAVING HER PUSH HER KNEE DOWN INTO SOCKET.    4:  CAN PLACE SMALL DOT OF BABY/MINERAL OIL ON KNEE CAP AND RUB UPWARD TO PREVENT FRICTION RUBBING FROM LINER.    5: BEGIN WEARING PROSTHESIS 2 HOURS ON THEN DO 2 HOURS OFF, THEN 2 HOURS ON (CAN DO 2-3 TIMES A DAY LIKE THIS).  AFTER A WEEK, IF SKIN STILL LOOKS GOOD/HEALING, INCREASE TO 3 HOURS ON, 2 HOURS OFF, 3 HOURS ON.  (CAN DO 2-3 TIMES PER DAY AS WELL).          ASSESSMENT:   CLINICAL IMPRESSION: Skilled session continues to work on seated therex for strengthening and flexibility along with gait and standing exercises for improved flexibility and weight bearing through prosthesis.  Continues to have excessive R knee flexion in stance.  Provided hamstring stretch today,  but discussed that we could have prosthetist flex socket slightly if needed.  Would like her to really work on stretching first.     OBJECTIVE IMPAIRMENTS Abnormal gait, decreased activity tolerance, decreased balance, decreased endurance, decreased knowledge of use of DME, decreased mobility, difficulty walking, decreased ROM, decreased strength, impaired perceived functional ability, impaired flexibility, postural dysfunction, and prosthetic dependency .    ACTIVITY LIMITATIONS carrying, lifting, bending, standing, squatting, stairs, transfers, bathing, toileting, dressing, reach over head, hygiene/grooming, and locomotion level   PARTICIPATION LIMITATIONS: cleaning, laundry, driving, shopping, and community activity   PERSONAL FACTORS 3+ comorbidities: see above  are also affecting patient's functional outcome.    REHAB POTENTIAL: Good   CLINICAL DECISION MAKING: Evolving/moderate complexity  EVALUATION COMPLEXITY: Moderate     GOALS: Goals reviewed with patient? Yes   SHORT TERM GOALS: Target date: 08/06/2022     Pt/caregiver will be IND with initial HEP in order to indicate improved functional mobility and dec fall risk. Baseline: Goal status: MET    2.  Pt will be able to don/doff prosthesis at min A level and tolerate 6 hours of wear time per day without skin break down. Baseline: mod/max A at times  Goal status: Progressing    3.  Pt will tolerate standing x 5 mins with intermittent support at S level in order to indicate improved independence with ADLs.  Baseline: Can stand up to 2 mins without support  Goal status: Progressing    4.  Will assess BERG balance when able and set LTG to reflect decreased fall risk.  Baseline: (08/12/2022) 27/56 Goal status: MET   5.  Will assess gait speed and set LTG to reflect progress.  Baseline: 1.29 ft/sec (08/12/2022) Goal status: MET   6.  Pt will ambulate x 100' with RW and prosthesis at min A level in order to indicate  improved household mobility.  Baseline: 115' Goal status: MET    LONG TERM GOALS: Target date: 09/07/2022     Pt will be IND with final HEP in order to indicate improved functional mobility and dec fall risk. Baseline:  Goal status: INITIAL   2.  Pt will ambulate x 150' indoors and 300' outdoors over unlevel paved surfaces with RW at S level in order to indicate improved household and community mobility.  Baseline:  Goal status: INITIAL   3.  Pt will negotiate up/down curb/ramp with RW and 4 steps with rails at S level in order to indicate improved community access.   Baseline:  Goal status: INITIAL   4.  Pt will tolerate standing x 10 mins with intermittent UE support while performing ADLs in order to indicate improved household independence.  Baseline:  Goal status: INITIAL   5.  Pt will increase BERG balance score to >/=40/56 to demonstrate improved static balance.  Baseline: 27/56 (11/21) Goal status: INITIAL   6.  Pt will demonstrate a gait speed of >/=2.0 feet/sec in order to decrease risk for falls. Baseline: 1.29 ft/sec w/ RW (11/21) Goal status: INITIAL   7.  Pt will don/doff prosthesis at S level and tolerate wearing prosthesis >/=90% of awake time in order to indicate more independent prosthetic care.   Baseline:  Goal status: INITIAL     PLAN: PT FREQUENCY: 2x/week   PT DURATION: 8 weeks   PLANNED INTERVENTIONS: Therapeutic exercises, Therapeutic activity, Neuromuscular re-education, Balance training, Gait training, Patient/Family education, Self Care, Joint mobilization, Stair training, Vestibular training, Prosthetic training, DME instructions, and Manual therapy   PLAN FOR NEXT SESSION: check LTGs and recert for 8 more weeks.  Do hip and hamstring stretch, seated scifit, Is she wearing 6 hours total per day?? How is hip stretch going? Add to HEP if appropriate. Continue to check skin.  Continue prosthetic education.  Prosthetic gait, balance, SLS tasks.  have  her work on donning/doffing prosthesis.  Add to standing exercises.    Cameron Sprang, PT, MPT [facility] [address] [place], [place], [address] Phone: [phone]   Fax:  [phone] 09/02/22, 10:50 AM

## 2022-09-04 ENCOUNTER — Ambulatory Visit: Payer: Medicare Other | Admitting: Rehabilitation

## 2022-09-04 ENCOUNTER — Encounter: Payer: Self-pay | Admitting: Rehabilitation

## 2022-09-04 DIAGNOSIS — R2681 Unsteadiness on feet: Secondary | ICD-10-CM

## 2022-09-04 DIAGNOSIS — R293 Abnormal posture: Secondary | ICD-10-CM

## 2022-09-04 DIAGNOSIS — R2689 Other abnormalities of gait and mobility: Secondary | ICD-10-CM

## 2022-09-04 DIAGNOSIS — M6281 Muscle weakness (generalized): Secondary | ICD-10-CM

## 2022-09-04 NOTE — Therapy (Signed)
OUTPATIENT PHYSICAL THERAPY TREATMENT NOTE/RECERTIFICATION    Patient Name: Caitlin Sharp MRN: [id number] DOB:1951/01/28, 71 y.o., female Today's Date: 09/04/2022  PCP: Veleta Miners, MD REFERRING PROVIDER: Mechele Claude, PA-C  END OF SESSION:   PT End of Session - 09/04/22 0934     Visit Number 14    Number of Visits 30   per updated [facility]   Date for PT Re-Evaluation 11/03/22   per updated [place] [employer] (10th visit PN needed)    PT Start Time 0934    PT Stop Time 1017    PT Time Calculation (min) 43 min    Equipment Utilized During Treatment Gait belt    Activity Tolerance Patient tolerated treatment well    Behavior During Therapy WFL for tasks assessed/performed               Past Medical History:  Diagnosis Date   Abnormal thyroid blood test    Per Records recevied from [facility], Dr.Pearson   Acute deep vein thrombosis of right peroneal vein ([place]) 10/05/2020   RLE DVT   Anxiety    Arthritis    RA   Coronary artery disease    Diabetes type I ([place])    Per [facility] New Patient Packet   Diabetic retinopathy ([place])    Per [facility] New Patient Packet   DKA (diabetic ketoacidosis) ([place]) 2021   Dr.William Zackowski.  Insulin Pump was out of Insulin   Glaucoma    Per [place] Patient Packet   H/O heart bypass surgery 2012   Dr. Luana Shu, Per [facility] New Patient Packet   Heart murmur    when I was a teenager   History of blood transfusion    History of kidney stones    passed   Hx of CABG    Per records received form [facility], Dr.Pearson    Left bundle branch block    Mild mitral regurgitation    Per records from [facility] [place]    Neuropathy 1976   Per [facility] Patient Packet   NSTEMI (non-ST elevated myocardial infarction) ([place]) 09/21/2020   NSTEMI/demand ischemic in setting of DKA, patent grafts by 09/24/20 [facility], continue medical therapy   Osteoporosis    T Score -2.7 , Per [facility] New Patient Packet   Proliferative diabetic retinopathy of  both eyes associated with diabetes mellitus due to underlying condition [facility])    Per Records recevied from [facility], Dr.Pearson   PVD (peripheral vascular disease) ([place]) 10/27/2021   Type 1 diabetes mellitus with hyperglycemia ([place]) 10/28/2021   Past Surgical History:  Procedure Laterality Date   ABDOMINAL AORTOGRAM W/LOWER EXTREMITY N/A 10/02/2021   Procedure: ABDOMINAL AORTOGRAM W/LOWER EXTREMITY;  Surgeon: Broadus John, MD;  Location: [facility];  Service: Cardiovascular;  Laterality: N/A;   AMPUTATION Right 04/17/2021   Procedure: RIGHT GREAT TOE AMPUTATION;  Surgeon: Newt Minion, MD;  Location: [facility];  Service: Orthopedics;  Laterality: Right;   AMPUTATION Right 07/19/2021   Procedure: RIGHT FOOT 1ST RAY AMPUTATION;  Surgeon: Newt Minion, MD;  Location: [place];  Service: Orthopedics;  Laterality: Right;   AMPUTATION Right 02/06/2022   Procedure: AMPUTATION BELOW KNEE;  Surgeon: Wylene Simmer, MD;  Location: [facility];  Service: Orthopedics;  Laterality: Right;  regional block   CESAREAN SECTION  1970   Dr. Moses Manners, Per [facility] New Patient Packet   CESAREAN SECTION  1973   Dr. 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 ([place]) 07/29/2022   Ischemic cardiomyopathy 07/29/2022   Moderate episode of recurrent major depressive disorder ([place]) 07/29/2022   Anemia of chronic disease 07/29/2022   S/P BKA (below knee amputation) unilateral, right ([place]) 07/29/2022   Daytime sleepiness 07/29/2022   Cognitive impairment 07/29/2022   Osteomyelitis ([place]) 02/06/2022   Osteomyelitis of right foot ([place]) 02/06/2022   Type 1 diabetes mellitus with hyperglycemia ([place]) 10/28/2021   PVD (peripheral vascular disease) ([place]) 10/27/2021   Osteomyelitis of great toe of right foot ([facility])    NSVT (nonsustained ventricular tachycardia) ([place]) 01/17/2021   Acute deep vein  thrombosis (DVT) of right peroneal vein ([place]) 10/17/2020   Insulin long-term use ([place]) 07/18/2020   Bruit of left carotid artery 10/31/2019   Coronary artery disease involving native coronary artery of native heart without angina pectoris 10/31/2019   Open-angle glaucoma of right eye 10/31/2019   Dry eyes 03/01/2018   Presence of intraocular lens 10/18/2015   Cystoid macular edema 06/14/2012    REFERRING DIAG: Z89.511 (ICD-10-CM) - Acquired absence of right leg below knee Z47.89 (ICD-10-CM) - Encounter for prosthetic gait training    THERAPY DIAG:  Unsteadiness on feet  Abnormal posture  Other abnormalities of gait and mobility  Muscle weakness (generalized)  Rationale for Evaluation and Treatment Rehabilitation  PERTINENT HISTORY: Hx of BKA to the right leg due to osteomyelitis 02/06/22. Worked with therapy and then transferred to assisted living. She has been a Type 1 DM for over 50 years. Used to be on a insulin pump now on lantus and meal coverage.  Recently saw endocrinology and they lowered her lantus as she voiced concern for hypoglycemia. A1C 8.6 on 04/22/22 which is improved  She keeps getting skin tears from hitting objects such as her WC She is awaiting a prosthesis from [facility].  Was treated for thigh cellulitis due to an infected skin tear with Doxycycline on 04/29/22.  She reports the area is much improved. Now has a skin tear to the LLE that has some mild yellow drainage and redness per nursing staff that is concerning.   PRECAUTIONS: Fall  SUBJECTIVE:                                                                                                                                                                                      SUBJECTIVE STATEMENT:   No new issues, is in w/c due to being with [facility] staff vs son.   PAIN:  Are you having pain? No  OBJECTIVE: (objective measures completed at initial evaluation unless otherwise dated)  TODAY'S TREATMENT:   CURRENT PROSTHETIC WEAR ASSESSMENT: Patient is independent with:  She is now able to assist some with doffing leg, donning socks;  Patient is dependent with: skin check, residual limb care, prosthetic cleaning, ply sock cleaning, correct ply sock adjustment, proper wear schedule/adjustment, proper weight-bearing schedule/adjustment Donning prosthesis: min A (to assist with aligning pin) Doffing prosthesis: S Prosthetic wear tolerance:  Pt is wearing 6 hours a day more consistently (wore about 6 hours Saturday and Sunday per patient report, but did not wear yesterday due to wound on leg.). Prosthetic weight bearing tolerance: approx 10-15 minutes at a time Edema: none noted Residual limb condition: Scabbed area from previous wound on distal end of limb, healing well, wearing vivewear shrinker.   Prosthetic description: Pin lock suspension K code/activity level with prosthetic use: Level 3  Pt presents today with vivewear under liner, and 3 ply.  We added 2, 1 ply as she did not have a 5 ply with her.  Told her that she could switch it for 5 ply when back to her room.   GAIT: Gait pattern: step to pattern, step through pattern, decreased stride length, lateral hip instability, trunk flexed, and narrow BOS Distance walked: 115' into clinic to scitfit and another 115' x 2 reps in session for improved quality and distance.  Assistive device utilized: Walker - 2 wheeled Level of assistance: SBA  Comments: Decreased heel strike.  Still having some continued R knee flexion but this did improve with continued gait and exercise. Continues to need cues for wider BOS, upright posture, and working to increase R knee extension in stance.    RAMP:  Level of Assistance: CGA Assistive device utilized: Walker - 2 wheeled Ramp Comments: Cues for sequencing and technique, keeping weight on heel of prosthesis when descending.  Has some trouble doing this, but no knee buckle noted.   CURB:  Level of  Assistance: CGA and Min A Assistive device utilized: Environmental consultant - 2 wheeled Curb Comments: Cues and demo for sequencing and technique using RW and for stepping sequence/safety.       [facility] PT Assessment - 09/04/22 0947       Standardized Balance Assessment   Standardized Balance Assessment Berg Balance Test      Berg Balance Test   Sit to Stand Able to stand  independently using hands    Standing Unsupported Able to stand 2 minutes with supervision    Sitting with Back Unsupported but Feet Supported on Floor or Stool Able to sit safely and securely 2 minutes    Stand to Sit Controls descent by using hands    Transfers Needs one person to assist    Standing Unsupported with Eyes Closed Able to stand 10 seconds with supervision    Standing Unsupported with Feet Together Needs help to attain position but able to stand for 30 seconds with feet together    From Standing, Reach Forward with Outstretched Arm Reaches forward but needs supervision    From Standing Position, Pick up Object from Floor Able to pick up shoe, needs supervision    From Standing Position, Turn to Look Behind Over each Shoulder Needs assist to keep from losing balance and falling    Turn 360 Degrees Needs assistance while turning    Standing Unsupported, Alternately Place Feet on Step/Stool Needs assistance to keep from falling or unable to try    Standing Unsupported, One Foot in [place]  Needs help to step but can hold 15 seconds   stepping prosthesis forward   Standing on One Leg Tries to lift leg/unable to hold 3 seconds but remains standing independently   standing on LLE   Total Score 24    Berg comment: < 36 high risk for falls (close to 100%)               PATIENT EDUCATED ON FOLLOWING PROSTHETIC CARE: Prosthetic wear tolerance: 4 hours/day, 7 days/week; Discussed progressing towards 6 hours per day, continues to be somewhat inconsistent Prosthetic weight bearing tolerance: 10-15 mins Other education  Skin  check, Residual limb care, Care of non-amputated limb, Prosthetic cleaning, Ply sock cleaning, Correct ply sock adjustment, [place] donning, Proper doffing, Proper wear schedule/adjustment, and Proper weight-bearing schedule/adjustment    PATIENT EDUCATION:  Continue with HEP and walking as able   HOME EXERCISE PROGRAM:  Access Code: [email or web address] URL: [email or web address] Date: 09/02/2022 Prepared by: Cameron Sprang  Exercises - Side Stepping with Counter Support  - 1 x daily - 7 x weekly - 2 sets - 10 reps - Standing Hip Abduction with Counter Support  - 1 x daily - 7 x weekly - 2 sets - 10 reps - Standing Thoracic and Cervical Rotation with Reach at [place]  - 1 x daily - 7 x weekly - 2 sets - 10 reps - Supine Hip Flexor Stretch with Weight  - 1 x daily - 7 x weekly - 1 sets - 2-3 reps - 30-60 secs hold - Seated Table Hamstring Stretch  - 1 x daily - 7 x weekly - 1 sets - 3 reps - 30 hold  Performed bolded exercises 12/12  INSTRUCTIONS FOR CNAS ON DONNING/DOFFING PROSTHESIS:   1:  WHEN WEARING PROSTHESIS, DON SNUG (SMALLER) BLACK CUT OFF SOCK FIRST UNDER SILICONE LINER.  HAVE HER SCOOT TO EDGE OF CHAIR.  THIS SHOULD BE DIRECTLY ON SKIN.  IF SHE IS HAVING BLEEDING OR IT GETS SOILED, SWITCH TO HER OTHER BLACK CUT OFF SOCK.  WHEN NOT WEARING PROSTHESIS, WEAR BLACK CUT OFF SOCK THAT IS LARGER WITH GRAY SHRINKER WITH RING.    2: INVERT LINER AND ENSURE THAT END IS AS FLAT AS POSSIBLE AND ENSURE PIN IS IN LINE WITH BONE.     3: WHEN DONNING WHITE SOCKS, HAVE HER TRY AND BUNDLE UP AND PLACE MIDDLE FINGERS IN HOLE SO THAT SHE KNOWS WHERE HOLE IS WHEN PUTTING ON.     3: PLACE PROSTHESIS ON FLOOR AND TIP BACK ON HEEL, HAVE HER PLACE LIMB INSIDE TO HEAR AT LEAST ONE CLICK PRIOR TO STANDING.  WORK ON HAVING HER PUSH HER KNEE DOWN INTO SOCKET.    4:  CAN PLACE SMALL DOT OF BABY/MINERAL OIL ON KNEE CAP AND RUB UPWARD TO PREVENT FRICTION RUBBING FROM LINER.    5: BEGIN WEARING PROSTHESIS 2  HOURS ON THEN DO 2 HOURS OFF, THEN 2 HOURS ON (CAN DO 2-3 TIMES A DAY LIKE THIS).  AFTER A WEEK, IF SKIN STILL LOOKS GOOD/HEALING, INCREASE TO 3 HOURS ON, 2 HOURS OFF, 3 HOURS ON.  (CAN DO 2-3 TIMES PER DAY AS WELL).          ASSESSMENT:   CLINICAL IMPRESSION: Skilled session addressed LTGs for recert for another 8 weeks.  Her BERG balance score is actually a little less today vs 4 week ago, however she reports increased back pain today.  She did not seem as confident on leg.  She is progressing with gait and  curb/ramp goals.  Due to slow start with wounds, etc. I am recerting for 8 more weeks to address remaining deficits.     OBJECTIVE IMPAIRMENTS Abnormal gait, decreased activity tolerance, decreased balance, decreased endurance, decreased knowledge of use of DME, decreased mobility, difficulty walking, decreased ROM, decreased strength, impaired perceived functional ability, impaired flexibility, postural dysfunction, and prosthetic dependency .    ACTIVITY LIMITATIONS carrying, lifting, bending, standing, squatting, stairs, transfers, bathing, toileting, dressing, reach over head, hygiene/grooming, and locomotion level   PARTICIPATION LIMITATIONS: cleaning, laundry, driving, shopping, and community activity   PERSONAL FACTORS 3+ comorbidities: see above  are also affecting patient's functional outcome.    REHAB POTENTIAL: Good   CLINICAL DECISION MAKING: Evolving/moderate complexity   EVALUATION COMPLEXITY: Moderate     GOALS: Goals reviewed with patient? Yes   SHORT TERM GOALS: Target date: 08/06/2022     Pt/caregiver will be IND with initial HEP in order to indicate improved functional mobility and dec fall risk. Baseline: Goal status: MET    2.  Pt will be able to don/doff prosthesis at min A level and tolerate 6 hours of wear time per day without skin break down. Baseline: mod/max A at times  Goal status: Progressing    3.  Pt will tolerate standing x 5 mins with  intermittent support at S level in order to indicate improved independence with ADLs.  Baseline: Can stand up to 2 mins without support  Goal status: Progressing    4.  Will assess BERG balance when able and set LTG to reflect decreased fall risk.  Baseline: (08/12/2022) 27/56 Goal status: MET   5.  Will assess gait speed and set LTG to reflect progress.  Baseline: 1.29 ft/sec (08/12/2022) Goal status: MET   6.  Pt will ambulate x 100' with RW and prosthesis at min A level in order to indicate improved household mobility.  Baseline: 115' Goal status: MET    LONG TERM GOALS: Target date: 09/07/2022     Pt will be IND with final HEP in order to indicate improved functional mobility and dec fall risk. Baseline:  Goal status: MET per pt report    2.  Pt will ambulate x 150' indoors and 300' outdoors over unlevel paved surfaces with RW at S level in order to indicate improved household and community mobility.  Baseline: Has met distance indoors with S however have not addressed outdoor gait goals yet.  Goal status: PROGRESSING   3.  Pt will negotiate up/down curb/ramp with RW and 4 steps with rails at S level in order to indicate improved community access.   Baseline: Min/guard to min A level  Goal status: PROGRESSING   4.  Pt will tolerate standing x 10 mins with intermittent UE support while performing ADLs in order to indicate improved household independence.  Baseline:  Goal status: MET per pt report    5.  Pt will increase BERG balance score to >/=40/56 to demonstrate improved static balance.  Baseline: 27/56 (11/21), 24/56 on 09/04/22 Goal status: NOT MET    6.  Pt will demonstrate a gait speed of >/=2.0 feet/sec in order to decrease risk for falls. Baseline: 1.29 ft/sec w/ RW (11/21) Goal status: INITIAL   7.  Pt will don/doff prosthesis at S level and tolerate wearing prosthesis >/=90% of awake time in order to indicate more independent prosthetic care.   Baseline:  Intermittently needs assist to push button on prosthesis to remove but overall is more independent with donning/doffing, not  consistently wearing 6 hours every day.  Goal status: PROGRESSING     UPDATED SHORT TERM GOALS: Target date: 10/03/2021     Pt/caregiver will be IND with ongoing HEP in order to indicate improved functional mobility and dec fall risk. Baseline: Goal status: ONGOING    2.  Pt will be able to don/doff prosthesis consistently at S level and tolerate 6 hours of wear time per day without skin break down. Baseline: Intermittent min A  Goal status: REVISED   3.  Pt will tolerate standing x 15 mins with intermittent support at S level in order to indicate improved independence with ADLs.  Baseline:  Goal status: REVISED    4.  Will improve BERG to >/=31/56 in order to indicate decreased fall risk.   Baseline: (08/12/2022) 27/56 Goal status: REVISED    5.  Will improve gait speed to >/=1.80 ft/sec with RW in order to indicate dec fall risk.  Baseline: 1.29 ft/sec (08/12/2022) Goal status: REVISED    6.  Pt will ambulate x 200' with RW and prosthesis at S in order to indicate improved household mobility.  Baseline: 200' CGA Goal status: REVISED    UPDATED LONG TERM GOALS: Target date: 11/03/2021     Pt will be IND with final HEP in order to indicate improved functional mobility and dec fall risk. Baseline:  Goal status: ONGOING    2.  Pt will ambulate x 150' indoors and 300' outdoors over unlevel paved surfaces with RW at S level in order to indicate improved household and community mobility.  Baseline:  Goal status: ONGOING    3.  Pt will negotiate up/down curb/ramp with RW and 4 steps with rails at S level in order to indicate improved community access.   Baseline:  Goal status: ONGOING      5.  Pt will increase BERG balance score to >/=36/56 to demonstrate improved static balance.  Baseline: 27/56 (11/21) Goal status: REVISED    6.  Pt will demonstrate  a gait speed of >/=2.4 feet/sec in order to decrease risk for falls. Baseline: 1.29 ft/sec w/ RW (11/21) Goal status: REVISED    7.  Pt will don/doff prosthesis at S level and tolerate wearing prosthesis >/=90% of awake time in order to indicate more independent prosthetic care.   Baseline:  Goal status: ONGOING   8.   Will initiate gait training with quad tip cane when appropriate and will update goal.    Baseline:   Goal Status: New     PLAN: PT FREQUENCY: 2x/week   PT DURATION: 8 weeks   PLANNED INTERVENTIONS: Therapeutic exercises, Therapeutic activity, Neuromuscular re-education, Balance training, Gait training, Patient/Family education, Self Care, Joint mobilization, Stair training, Vestibular training, Prosthetic training, DME instructions, and Manual therapy   PLAN FOR NEXT SESSION: Call [facility] if her son is present to schedule visit to adjust socket into more flexion to all foot to be flat during gait. Assess gait speed with RW, gait outdoors as able,  Do hip and hamstring stretch, seated scifit, Is she wearing 6 hours total per day?? How is hip stretch going? Add to HEP if appropriate. Continue to check skin.  Continue prosthetic education.  Prosthetic gait, balance, SLS tasks.  have her work on donning/doffing prosthesis.  Add to standing exercises.    Cameron Sprang, PT, MPT [facility] [address] [place], [place], [address] Phone: [phone]   Fax:  [phone] 09/04/22, 10:48 AM

## 2022-09-08 ENCOUNTER — Encounter: Payer: Self-pay | Admitting: Neurology

## 2022-09-08 ENCOUNTER — Ambulatory Visit (INDEPENDENT_AMBULATORY_CARE_PROVIDER_SITE_OTHER): Payer: Medicare Other | Admitting: Neurology

## 2022-09-08 VITALS — BP 99/52 | HR 76 | Ht 67.0 in | Wt 122.0 lb

## 2022-09-08 DIAGNOSIS — R0681 Apnea, not elsewhere classified: Secondary | ICD-10-CM | POA: Diagnosis not present

## 2022-09-08 DIAGNOSIS — G4719 Other hypersomnia: Secondary | ICD-10-CM | POA: Diagnosis not present

## 2022-09-08 DIAGNOSIS — R0683 Snoring: Secondary | ICD-10-CM | POA: Diagnosis not present

## 2022-09-08 DIAGNOSIS — Z9189 Other specified personal risk factors, not elsewhere classified: Secondary | ICD-10-CM

## 2022-09-08 NOTE — Patient Instructions (Signed)

## 2022-09-08 NOTE — Progress Notes (Signed)
Subjective:    [name] is a 71 y.o. female.  HPI    Star Age, MD, PhD [facility] [address], [address] [place], [place] [address]  Dear Dr. Lyndel Safe,  I saw your patient, Caitlin Sharp, upon your kind request in my neurologic clinic today for initial consultation of her sleep disorder, particularly her daytime somnolence, concern for underlying obstructive sleep apnea.  The patient is accompanied by her daughter-in-law today.  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  Her Epworth sleepiness or is 19 out of 24, fatigue severity score is 31 out of 63.  She had a sleep study at home from what I understand some 3 to 4 years ago but at that time she did not have any sleep apnea and did not qualify for CPAP therapy per patient.  I reviewed your office note from 07/14/2022.  She had blood work through your office at the time which I reviewed.  Hemoglobin on 07/17/2022 was below normal at 10.7, hematocrit below normal at 32, BMP showed glucose of 133, BUN 25, creatinine 1.0.  Sodium and potassium normal.  TSH on 04/22/2022 was in the normal range at 3.78.  Her family has noticed that it is hard for her to stay awake during the day.  Bedtime is generally between 10 and 11 and rise time around 6.  She has 2 grown sons.  She stays at [facility] assisted living.  She is widowed for 11 years, previously her husband had noticed gasping sounds at night and she has been known to snore.   She drinks caffeine in the form of soda, usually 1/day and occasional tea, no daily coffee.  She is a non-smoker and does not utilize any alcohol currently.  Prior sleep test results are not available for my review today.  She may have had testing through her PCP in [place] in the past.  She has had mild forgetfulness but nothing sinister per family.  She has good long-term memory.  Her Past Medical History Is Significant For: Past Medical History:  Diagnosis Date   Abnormal thyroid blood test    Per Records recevied from [facility], Dr.Pearson   Acute deep vein thrombosis of right peroneal vein ([place]) 10/05/2020   RLE DVT   Anxiety    Arthritis    RA   Coronary artery disease    Diabetes type I [facility])    Per [facility] New Patient Packet   Diabetic retinopathy ([place])    Per [facility] New Patient Packet   DKA (diabetic ketoacidosis) ([place]) 2021   Dr.William Zackowski.  Insulin Pump was out of Insulin   Glaucoma    Per [place] Patient Packet   H/O heart bypass surgery 2012   Dr. Luana Shu, Per [facility] New Patient Packet   Heart murmur    when I was a teenager   History of blood transfusion    History of kidney stones    passed   Hx of CABG    Per records received form [facility], Dr.Pearson  Left bundle branch block    Mild mitral regurgitation    Per records from [facility], [place]    Neuropathy 1976   Per [place] Patient Packet   NSTEMI (non-ST elevated myocardial infarction) ([place]) 09/21/2020   NSTEMI/demand ischemic in setting of DKA, patent grafts by 09/24/20 [facility], continue medical therapy   Osteoporosis    T Score -2.7 , Per [place] New Patient Packet   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 due to underlying condition [facility])    Per Records recevied from [facility], Dr.Pearson   PVD (peripheral vascular disease) ([place]) 10/27/2021   Type 1 diabetes mellitus with hyperglycemia ([place]) 10/28/2021    Her Past Surgical History Is Significant For: Past  Surgical History:  Procedure Laterality Date   ABDOMINAL AORTOGRAM W/LOWER EXTREMITY N/A 10/02/2021   Procedure: ABDOMINAL AORTOGRAM W/LOWER EXTREMITY;  Surgeon: Broadus John, MD;  Location: [place] CV LAB;  Service: Cardiovascular;  Laterality: N/A;   AMPUTATION Right 04/17/2021   Procedure: RIGHT GREAT TOE AMPUTATION;  Surgeon: Newt Minion, MD;  Location: [place];  Service: Orthopedics;  Laterality: Right;   AMPUTATION Right 07/19/2021   Procedure: RIGHT FOOT 1ST RAY AMPUTATION;  Surgeon: Newt Minion, MD;  Location: [place];  Service: Orthopedics;  Laterality: Right;   AMPUTATION Right 02/06/2022   Procedure: AMPUTATION BELOW KNEE;  Surgeon: Wylene Simmer, MD;  Location: [place];  Service: Orthopedics;  Laterality: Right;  regional block   CESAREAN SECTION  1970   Dr. Moses Manners, Per [facility] New Patient Packet   CESAREAN SECTION  1973   Dr. Moses Manners, Per [facility] New Patient Packet   CORONARY ARTERY BYPASS GRAFT      Her Family History Is Significant For: Family History  Problem Relation Age of Onset   Congestive Heart Failure Mother        Per [facility] New Patient Packet   Arthritis Mother        Per [place] Patient Packet   Osteoporosis Mother        Per [facility] New Patient Packet   Heart attack Father        Per [place] Patient Packet   Diabetes Sister        Per [facility] New Patient Packet   Emphysema Brother        Per [place] New Patient Packet   Heart disease Brother        Per [place] New Patient Packet   Sleep apnea Neg Hx    Alzheimer's disease Neg Hx    Dementia Neg Hx     Her Social History Is Significant For: Social History   Socioeconomic History   Marital status: Widowed    Spouse name: Not on file   Number of children: Not on file   Years of education: Not on file   Highest education level: Not on file  Occupational History   Not on file  Tobacco Use   Smoking status: Never   Smokeless tobacco: Never  Vaping Use   Vaping Use: Never used  Substance and Sexual Activity    Alcohol use: Never   Drug use: Never   Sexual activity: Not on file  Other Topics Concern   Not on file  Social History Narrative   Per [place] Patient Packet Abstracted on 02/14/2021      Diet: Left blank       Caffeine: Yes      Married, if yes what year:  Widowed, married in [place] you live in a house, apartment, assisted living, condo, trailer, ect: Assisted Living       Is it one or more stories: 2      How many persons live in your home?One      Pets:No      Highest level or education completed:       Current/Past profession: Accountant       Exercise:        Yes          Type and how often: Walking 3-4 time weekly          Living Will: Yes   DNR: No   POA/HPOA: Yes      Social Determinants of Radio broadcast assistant Strain: Not on file  Food Insecurity: Not on file  Transportation Needs: Not on file  Physical Activity: Not on file  Stress: Not on file  Social Connections: Not on file    Her Allergies Are:  Allergies  Allergen Reactions   Penicillins Anaphylaxis  :   Her Current Medications Are:  Outpatient Encounter Medications as of 09/08/2022  Medication Sig   acetaminophen (TYLENOL) 500 MG tablet Take 1,000 mg by mouth 2 (two) times daily as needed for moderate pain.   atorvastatin (LIPITOR) 40 MG tablet Take 40 mg by mouth at bedtime.   Biotin w/ Vitamins C & E (HAIR/SKIN/NAILS PO) Take 1 tablet by mouth in the morning.   brimonidine (ALPHAGAN) 0.2 % ophthalmic solution Place 1 drop into the left eye 3 (three) times daily.   calcium carbonate (OSCAL) 1500 (600 Ca) MG TABS tablet Take 600 mg of elemental calcium by mouth daily.   clopidogrel (PLAVIX) 75 MG tablet Take 75 mg by mouth in the morning.   Continuous Blood Gluc Sensor (FREESTYLE LIBRE 14 DAY SENSOR) MISC Inject 1 sensor to the skin every 14 days for continuous glucose monitoring.   docusate sodium (COLACE) 100 MG capsule Take 100 mg by mouth as needed for mild constipation.    glucosamine-chondroitin 500-400 MG tablet Take 1 tablet by mouth every morning.   glucose (B-D GLUCOSE) 5 g chewable tablet Chew 3 tablets (15 g total) by mouth as needed for low blood sugar.   insulin glargine (LANTUS SOLOSTAR) 100 UNIT/ML Solostar Pen Inject 18 Units into the skin daily. In the morning. PATIENT SELF ADMINISTERS   insulin lispro (HUMALOG) 100 UNIT/ML injection Inject 5 Units into the skin at bedtime.   insulin lispro (HUMALOG) 100 UNIT/ML KwikPen Inject 5-15 Units into the skin See admin instructions. If blood sugar is 151-200 = 5 units, 201-250 = 8 units, 251-300  = 10 units, 301-350 = 12 units, 351-400 = 14 units, 401 and above =  16.  If blood sugar is greater than 200 inject 5 units at bedtime, if greater than 500 call on call provider   Insulin Pen Needle (PEN NEEDLES 3/16") 31G X 5 MM MISC 1 Device by Does not apply route 5 (five) times daily. With insulin administration   latanoprost (XALATAN) 0.005 % ophthalmic solution Place 1 drop into both eyes every evening.   lisinopril (ZESTRIL) 2.5 MG tablet Take 2.5 mg by mouth in the morning.   miconazole (MICOTIN) 200 MG vaginal suppository Place 200 mg vaginally at bedtime as needed (vaginal itching).   Multiple Vitamins-Minerals (I-VITE PO) Take 1 tablet by mouth in the morning.   nitroGLYCERIN (NITROSTAT) 0.4 MG SL tablet Place 0.4 mg  under the tongue every 5 (five) minutes as needed for chest pain.   Omega-3 Fatty Acids (FISH OIL) 1000 MG CAPS Take 1,000 mg by mouth in the morning.   omeprazole (PRILOSEC) 20 MG capsule Take 20 mg by mouth daily.   sennosides-docusate sodium (SENOKOT-S) 8.6-50 MG tablet Take 1 tablet by mouth every other day. IN THE MORNING   timolol (TIMOPTIC) 0.5 % ophthalmic solution Place 1 drop into the left eye 2 (two) times daily.   No facility-administered encounter medications on file as of 09/08/2022.  :   Review of Systems:  Out of a complete 14 point review of systems, all are reviewed and  negative with the exception of these symptoms as listed below:   Review of Systems  Neurological:        Pt here for sleep consult Pt snores,fatigue. Pt denies headaches ,hypertension,sleep study,CPAP machine Pt states memory is day by day    ESS:19 FSS:31     Objective:  Neurological Exam  Physical Exam Physical Examination:   Vitals:   09/08/22 1353  BP: (!) 99/52  Pulse: 76    General Examination: The patient is a very pleasant 71 y.o. female in no acute distress. She appears well-developed and well-nourished and well groomed.   HEENT: Normocephalic, atraumatic, pupils are equal, round and reactive to light, extraocular tracking is good without limitation to gaze excursion or nystagmus noted.  Corrective eyeglasses in place.  Hearing is grossly intact. Face is symmetric with normal facial animation. Speech is clear with no dysarthria noted. There is no hypophonia. There is no lip, neck/head, jaw or voice tremor. Neck is supple with full range of passive and active motion. There are no carotid bruits on auscultation. Oropharynx exam reveals: moderate mouth dryness, adequate dental hygiene and moderate airway crowding, due to small airway, Mallampati class III, tonsils absent, neck circumference of 14-3/8 inches.  Minimal overbite.  Tongue protrudes centrally and palate elevates symmetrically.  Chest: Clear to auscultation without wheezing, rhonchi or crackles noted.  Heart: S1+S2+0, regular and normal without murmurs, rubs or gallops noted.   Abdomen: Soft, non-tender and non-distended.  Extremities: There is no obvious edema.  Right below-knee amputation.   Skin: Warm and dry without trophic changes noted.   Musculoskeletal: exam reveals no obvious joint deformities.   Neurologically:  Mental status: The patient is awake, alert and oriented in all 4 spheres. Her immediate and remote memory, attention, language skills and fund of knowledge are appropriate. There is no  evidence of aphasia, agnosia, apraxia or anomia. Speech is clear with normal prosody and enunciation. Thought process is linear. Mood is normal and affect is normal.  Cranial nerves II - XII are as described above under HEENT exam.  Motor exam: Normal bulk, moving all 4 extremities, no involuntary movements or tremors noted.    Fine motor skills and coordination: grossly intact in the upper extremities.  Cerebellar testing: No dysmetria or intention tremor. There is no truncal or gait ataxia.  Sensory exam: intact to light touch in the upper and lower extremities.  Gait, station and balance: She is in a wheelchair.  Right leg on the leg rest, right below-knee amputation.  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  She also has multiple vascular risk factors and would be at risk for vascular dementia, given that she has had cognitive complaints.  We will go ahead with sleep testing at this time, we mutually agreed to pursue a home sleep test for her convenience.   I had a long chat with the patient and her daughter-in-law about my findings and the diagnosis of sleep apnea, particularly OSA, its prognosis and treatment options. We talked about medical/conservative treatments, surgical interventions and non-pharmacological approaches for symptom control. I explained, in particular, the risks and ramifications of untreated moderate to severe OSA, especially with  respect to developing cardiovascular disease down the road, including congestive heart failure (CHF), difficult to treat hypertension, cardiac arrhythmias (particularly A-fib), neurovascular complications including TIA, stroke and dementia. Even type 2 diabetes has, in part, been linked to untreated OSA. Symptoms of untreated OSA may include (but may not be limited to) daytime sleepiness, nocturia (i.e. frequent nighttime urination), memory problems, mood irritability and suboptimally controlled or worsening mood disorder such as depression and/or anxiety, lack of energy, lack of motivation, physical discomfort, as well as recurrent headaches, especially morning or nocturnal headaches.  I recommended a sleep study at this time. I outlined the differences between a laboratory attended sleep study which is considered more comprehensive and accurate over the option of a home sleep test (HST); the latter may lead to underestimation of sleep disordered breathing in some instances and does not help with diagnosing upper airway resistance syndrome and is not accurate enough to diagnose primary central sleep apnea typically. I outlined possible surgical and non-surgical treatment options of OSA, including the use of a positive airway pressure (PAP) device (i.e. CPAP, AutoPAP/APAP or BiPAP in certain circumstances), a custom-made dental device (aka oral appliance, which would require a referral to a specialist dentist or orthodontist typically, and is generally speaking not considered for patients with full dentures or edentulous state), upper airway surgical options, such as traditional UPPP (which is not considered a first-line treatment) or the Inspire device (hypoglossal nerve stimulator, which would involve a referral for consultation with an ENT surgeon, after careful selection, following inclusion criteria - also not first-line treatment). I explained the PAP treatment option to the patient in detail, as this is  generally considered first-line treatment.  The patient indicated that she would be willing to try PAP therapy, if the need arises. I explained the importance of being compliant with PAP treatment, not only for insurance purposes but primarily to improve patient's symptoms symptoms, and for the patient's long term health benefit, including to reduce Her cardiovascular risks longer-term.    We will pick up our discussion about the next steps and treatment options after testing.  We will keep them posted as to the test results by phone call and/or MyChart messaging where possible.  We will plan to follow-up in sleep clinic accordingly as well.  I answered all their questions today and the patient and her daughter-in-law were in agreement.  I encouraged them to call with any interim questions, concerns, problems or updates or email [place] through MyChart.  Generally speaking, sleep test authorizations may take up to 2 weeks, sometimes less, sometimes longer, the patient is encouraged to get in touch with [place] if they do not hear back from the  sleep lab staff directly within the next 2 weeks.  Thank you very much for allowing me to participate in the care of this nice patient. If I can be of any further assistance to you please do not hesitate to call me at [phone].  Sincerely,   Star Age, MD, PhD

## 2022-09-09 ENCOUNTER — Ambulatory Visit: Payer: Medicare Other | Admitting: Rehabilitation

## 2022-09-09 ENCOUNTER — Ambulatory Visit: Payer: Medicare Other | Admitting: Physical Therapy

## 2022-09-09 ENCOUNTER — Telehealth: Payer: Self-pay | Admitting: Internal Medicine

## 2022-09-09 ENCOUNTER — Encounter: Payer: Self-pay | Admitting: Physical Therapy

## 2022-09-09 DIAGNOSIS — M6281 Muscle weakness (generalized): Secondary | ICD-10-CM

## 2022-09-09 DIAGNOSIS — R293 Abnormal posture: Secondary | ICD-10-CM

## 2022-09-09 DIAGNOSIS — R2681 Unsteadiness on feet: Secondary | ICD-10-CM | POA: Diagnosis not present

## 2022-09-09 DIAGNOSIS — R2689 Other abnormalities of gait and mobility: Secondary | ICD-10-CM

## 2022-09-09 NOTE — Therapy (Signed)
OUTPATIENT PHYSICAL THERAPY TREATMENT NOTE   Patient Name: Caitlin Sharp MRN: [id number] DOB:May 14, 1951, 71 y.o., female Today's Date: 09/09/2022  PCP: Veleta Miners, MD REFERRING PROVIDER: Mechele Claude, PA-C  END OF SESSION:   PT End of Session - 09/09/22 0944     Visit Number 15    Number of Visits 30   per updated [place]   Date for PT Re-Evaluation 11/03/22   per updated [place] [employer] (10th visit PN needed)    PT Start Time 0940   pt late   PT Stop Time 1022    PT Time Calculation (min) 42 min    Equipment Utilized During Treatment Gait belt    Activity Tolerance Patient tolerated treatment well    Behavior During Therapy WFL for tasks assessed/performed               Past Medical History:  Diagnosis Date   Abnormal thyroid blood test    Per Records recevied from [facility], Dr.Pearson   Acute deep vein thrombosis of right peroneal vein ([place]) 10/05/2020   RLE DVT   Anxiety    Arthritis    RA   Coronary artery disease    Diabetes type I ([place])    Per [facility] New Patient Packet   Diabetic retinopathy ([place])    Per [place] New Patient Packet   DKA (diabetic ketoacidosis) ([place]) 2021   Dr.William Zackowski.  Insulin Pump was out of Insulin   Glaucoma    Per [place] Patient Packet   H/O heart bypass surgery 2012   Dr. Luana Shu, Per [facility] New Patient Packet   Heart murmur    when I was a teenager   History of blood transfusion    History of kidney stones    passed   Hx of CABG    Per records received form [facility], Dr.Pearson    Left bundle branch block    Mild mitral regurgitation    Per records from [facility] [place]    Neuropathy 1976   Per [place] Patient Packet   NSTEMI (non-ST elevated myocardial infarction) ([place]) 09/21/2020   NSTEMI/demand ischemic in setting of DKA, patent grafts by 09/24/20 [facility], continue medical therapy   Osteoporosis    T Score -2.7 , Per [place] New Patient Packet   Proliferative diabetic retinopathy of both eyes  associated with diabetes mellitus due to underlying condition [facility])    Per Records recevied from [facility], Dr.Pearson   PVD (peripheral vascular disease) ([place]) 10/27/2021   Type 1 diabetes mellitus with hyperglycemia ([place]) 10/28/2021   Past Surgical History:  Procedure Laterality Date   ABDOMINAL AORTOGRAM W/LOWER EXTREMITY N/A 10/02/2021   Procedure: ABDOMINAL AORTOGRAM W/LOWER EXTREMITY;  Surgeon: Broadus John, MD;  Location: [place] CV [facility];  Service: Cardiovascular;  Laterality: N/A;   AMPUTATION Right 04/17/2021   Procedure: RIGHT GREAT TOE AMPUTATION;  Surgeon: Newt Minion, MD;  Location: [place];  Service: Orthopedics;  Laterality: Right;   AMPUTATION Right 07/19/2021   Procedure: RIGHT FOOT 1ST RAY AMPUTATION;  Surgeon: Newt Minion, MD;  Location: [place];  Service: Orthopedics;  Laterality: Right;   AMPUTATION Right 02/06/2022   Procedure: AMPUTATION BELOW KNEE;  Surgeon: Wylene Simmer, MD;  Location: [place];  Service: Orthopedics;  Laterality: Right;  regional block   CESAREAN SECTION  1970   Dr. Moses Manners, Per [facility] New Patient Packet   CESAREAN SECTION  1973   Dr. 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 ([place]) 07/29/2022   Ischemic cardiomyopathy 07/29/2022   Moderate episode of recurrent major depressive disorder ([place]) 07/29/2022   Anemia of chronic disease 07/29/2022   S/P BKA (below knee amputation) unilateral, right ([place]) 07/29/2022   Daytime sleepiness 07/29/2022   Cognitive impairment 07/29/2022   Osteomyelitis ([place]) 02/06/2022   Osteomyelitis of right foot ([place]) 02/06/2022   Type 1 diabetes mellitus with hyperglycemia ([place]) 10/28/2021   PVD (peripheral vascular disease) ([place]) 10/27/2021   Osteomyelitis of great toe of right foot ([facility])    NSVT (nonsustained ventricular tachycardia) ([place]) 01/17/2021   Acute deep vein thrombosis  (DVT) of right peroneal vein ([place]) 10/17/2020   Insulin long-term use ([place]) 07/18/2020   Bruit of left carotid artery 10/31/2019   Coronary artery disease involving native coronary artery of native heart without angina pectoris 10/31/2019   Open-angle glaucoma of right eye 10/31/2019   Dry eyes 03/01/2018   Presence of intraocular lens 10/18/2015   Cystoid macular edema 06/14/2012    REFERRING DIAG: Z89.511 (ICD-10-CM) - Acquired absence of right leg below knee Z47.89 (ICD-10-CM) - Encounter for prosthetic gait training    THERAPY DIAG:  Unsteadiness on feet  Abnormal posture  Other abnormalities of gait and mobility  Muscle weakness (generalized)  Rationale for Evaluation and Treatment Rehabilitation  PERTINENT HISTORY: Hx of BKA to the right leg due to osteomyelitis 02/06/22. Worked with therapy and then transferred to assisted living. She has been a Type 1 DM for over 50 years. Used to be on a insulin pump now on lantus and meal coverage.  Recently saw endocrinology and they lowered her lantus as she voiced concern for hypoglycemia. A1C 8.6 on 04/22/22 which is improved  She keeps getting skin tears from hitting objects such as her WC She is awaiting a prosthesis from [facility].  Was treated for thigh cellulitis due to an infected skin tear with Doxycycline on 04/29/22.  She reports the area is much improved. Now has a skin tear to the LLE that has some mild yellow drainage and redness per nursing staff that is concerning.   PRECAUTIONS: Fall  SUBJECTIVE:                                                                                                                                                                                      SUBJECTIVE STATEMENT:   Pt reports skin is doing well, son states they were a bit out of sync this morning having difficulty getting here.  She has a bandage on her right hand and left lower leg from hitting her skin on pedal of wheelchair and water  fountain.  PAIN:  Are you having pain? No   OBJECTIVE: (objective measures completed at initial evaluation unless otherwise dated)  TODAY'S TREATMENT:  CURRENT PROSTHETIC WEAR ASSESSMENT: Patient is independent with:  She is now able to assist some with doffing leg, donning socks;  Patient is dependent with: skin check, residual limb care, prosthetic cleaning, ply sock cleaning, correct ply sock adjustment, proper wear schedule/adjustment, proper weight-bearing schedule/adjustment Donning prosthesis: min A (to assist with aligning pin) Doffing prosthesis: S Prosthetic wear tolerance:  Pt is wearing 6 hours a day more consistently (wore about 6 hours Saturday and Sunday per patient report, but did not wear yesterday due to wound on leg.). Prosthetic weight bearing tolerance: approx 30 minutes at a time Edema: none noted Residual limb condition: Scabbed area from previous wound on distal end of limb, healing well, wearing vivewear shrinker.   Prosthetic description: Pin lock suspension K code/activity level with prosthetic use: Level 3  Pt presents today with vivewear under liner, and 5 ply sock.  PT spoke with [facility] to schedule appt for 09/16/2022 at 11am w/ Bobby-left note with receptionist about flexion of socket so heel touches ground and re-iterated to son, son aware and both pt and son agreeable to appt.  GAIT: Gait pattern: step to pattern, step through pattern, decreased stride length, lateral hip instability, trunk flexed, and narrow BOS Distance walked: 380'  Assistive device utilized: Environmental consultant - 2 wheeled Level of assistance: SBA and CGA Comments: Decreased heel strike.  Still having some continued R knee flexion during stance phase w/ heel not touching in stride. Continues to need cues for wider BOS, improved RLE swing through, and approximation to RW.  Pt shrinks posture with turning it seems.  Pt requires CGA during x2 right toe catches w/ increased distance, she denies  feeling fatigued from exercise stating she is just fatigued at baseline today.  PT set out narrowed cones in straight arrangement for weaving w/ pt cued for improved posture and tighter turns.   PATIENT EDUCATED ON FOLLOWING PROSTHETIC CARE: Prosthetic wear tolerance: 4 hours/day, 7 days/week; Discussed progressing towards 6 hours per day, continues to be somewhat inconsistent-PT today unsure of wear schedule as pt stating this varies depending on what she has to do during the day, son unsure. Prosthetic weight bearing tolerance: 10-15 mins Other education  Skin check, Residual limb care, Care of non-amputated limb, Prosthetic cleaning, Ply sock cleaning, Correct ply sock adjustment, [place] donning, Proper doffing, Proper wear schedule/adjustment, and Proper weight-bearing schedule/adjustment    PATIENT EDUCATION:  Continue with HEP (especially stretching hamstrings and hip flexors to improve quality of gait) and walking as able.  HOME EXERCISE PROGRAM:  Access Code: [email or web address] URL: [email or web address] Date: 09/02/2022 Prepared by: Cameron Sprang  Exercises - Side Stepping with Counter Support  - 1 x daily - 7 x weekly - 2 sets - 10 reps - Standing Hip Abduction with Counter Support  - 1 x daily - 7 x weekly - 2 sets - 10 reps - Standing Thoracic and Cervical Rotation with Reach at [place]  - 1 x daily - 7 x weekly - 2 sets - 10 reps - Supine Hip Flexor Stretch with Weight  - 1 x daily - 7 x weekly - 1 sets - 2-3 reps - 30-60 secs hold - Seated Table Hamstring Stretch  - 1 x daily - 7  x weekly - 1 sets - 3 reps - 30 hold  Performed bolded exercises 12/12  INSTRUCTIONS FOR CNAS ON DONNING/DOFFING PROSTHESIS:   1:  WHEN WEARING PROSTHESIS, DON SNUG (SMALLER) BLACK CUT OFF SOCK FIRST UNDER SILICONE LINER.  HAVE HER SCOOT TO EDGE OF CHAIR.  THIS SHOULD BE DIRECTLY ON SKIN.  IF SHE IS HAVING BLEEDING OR IT GETS SOILED, SWITCH TO HER OTHER BLACK CUT OFF SOCK.  WHEN NOT WEARING  PROSTHESIS, WEAR BLACK CUT OFF SOCK THAT IS LARGER WITH GRAY SHRINKER WITH RING.    2: INVERT LINER AND ENSURE THAT END IS AS FLAT AS POSSIBLE AND ENSURE PIN IS IN LINE WITH BONE.     3: WHEN DONNING WHITE SOCKS, HAVE HER TRY AND BUNDLE UP AND PLACE MIDDLE FINGERS IN HOLE SO THAT SHE KNOWS WHERE HOLE IS WHEN PUTTING ON.     3: PLACE PROSTHESIS ON FLOOR AND TIP BACK ON HEEL, HAVE HER PLACE LIMB INSIDE TO HEAR AT LEAST ONE CLICK PRIOR TO STANDING.  WORK ON HAVING HER PUSH HER KNEE DOWN INTO SOCKET.    4:  CAN PLACE SMALL DOT OF BABY/MINERAL OIL ON KNEE CAP AND RUB UPWARD TO PREVENT FRICTION RUBBING FROM LINER.    5: BEGIN WEARING PROSTHESIS 2 HOURS ON THEN DO 2 HOURS OFF, THEN 2 HOURS ON (CAN DO 2-3 TIMES A DAY LIKE THIS).  AFTER A WEEK, IF SKIN STILL LOOKS GOOD/HEALING, INCREASE TO 3 HOURS ON, 2 HOURS OFF, 3 HOURS ON.  (CAN DO 2-3 TIMES PER DAY AS WELL).          ASSESSMENT:   CLINICAL IMPRESSION: Focus of session on continuing gait training w/ focus on turning and improved mechanics during obstacle negotiation.  PT unclear on progression of wear schedule, but pt is progressing in weight bearing tolerance and continues to have adequate skin integrity on the residual limb.  She would further benefit from skilled PT as modifications are made to her socket and prosthesis to continue progressing her safety with upright mobility and progress to LRAD for ambulation as able.    OBJECTIVE IMPAIRMENTS Abnormal gait, decreased activity tolerance, decreased balance, decreased endurance, decreased knowledge of use of DME, decreased mobility, difficulty walking, decreased ROM, decreased strength, impaired perceived functional ability, impaired flexibility, postural dysfunction, and prosthetic dependency .    ACTIVITY LIMITATIONS carrying, lifting, bending, standing, squatting, stairs, transfers, bathing, toileting, dressing, reach over head, hygiene/grooming, and locomotion level   PARTICIPATION  LIMITATIONS: cleaning, laundry, driving, shopping, and community activity   PERSONAL FACTORS 3+ comorbidities: see above  are also affecting patient's functional outcome.    REHAB POTENTIAL: Good   CLINICAL DECISION MAKING: Evolving/moderate complexity   EVALUATION COMPLEXITY: Moderate     GOALS: Goals reviewed with patient? Yes   SHORT TERM GOALS: Target date: 08/06/2022     Pt/caregiver will be IND with initial HEP in order to indicate improved functional mobility and dec fall risk. Baseline: Goal status: MET    2.  Pt will be able to don/doff prosthesis at min A level and tolerate 6 hours of wear time per day without skin break down. Baseline: mod/max A at times  Goal status: Progressing    3.  Pt will tolerate standing x 5 mins with intermittent support at S level in order to indicate improved independence with ADLs.  Baseline: Can stand up to 2 mins without support  Goal status: Progressing    4.  Will assess BERG balance when able and set LTG  to reflect decreased fall risk.  Baseline: (08/12/2022) 27/56 Goal status: MET   5.  Will assess gait speed and set LTG to reflect progress.  Baseline: 1.29 ft/sec (08/12/2022) Goal status: MET   6.  Pt will ambulate x 100' with RW and prosthesis at min A level in order to indicate improved household mobility.  Baseline: 115' Goal status: MET    LONG TERM GOALS: Target date: 09/07/2022     Pt will be IND with final HEP in order to indicate improved functional mobility and dec fall risk. Baseline:  Goal status: MET per pt report    2.  Pt will ambulate x 150' indoors and 300' outdoors over unlevel paved surfaces with RW at S level in order to indicate improved household and community mobility.  Baseline: Has met distance indoors with S however have not addressed outdoor gait goals yet.  Goal status: PROGRESSING   3.  Pt will negotiate up/down curb/ramp with RW and 4 steps with rails at S level in order to indicate improved  community access.   Baseline: Min/guard to min A level  Goal status: PROGRESSING   4.  Pt will tolerate standing x 10 mins with intermittent UE support while performing ADLs in order to indicate improved household independence.  Baseline:  Goal status: MET per pt report    5.  Pt will increase BERG balance score to >/=40/56 to demonstrate improved static balance.  Baseline: 27/56 (11/21), 24/56 on 09/04/22 Goal status: NOT MET    6.  Pt will demonstrate a gait speed of >/=2.0 feet/sec in order to decrease risk for falls. Baseline: 1.29 ft/sec w/ RW (11/21) Goal status: INITIAL   7.  Pt will don/doff prosthesis at S level and tolerate wearing prosthesis >/=90% of awake time in order to indicate more independent prosthetic care.   Baseline: Intermittently needs assist to push button on prosthesis to remove but overall is more independent with donning/doffing, not consistently wearing 6 hours every day.  Goal status: PROGRESSING     UPDATED SHORT TERM GOALS: Target date: 10/03/2021     Pt/caregiver will be IND with ongoing HEP in order to indicate improved functional mobility and dec fall risk. Baseline: Goal status: ONGOING    2.  Pt will be able to don/doff prosthesis consistently at S level and tolerate 6 hours of wear time per day without skin break down. Baseline: Intermittent min A  Goal status: REVISED   3.  Pt will tolerate standing x 15 mins with intermittent support at S level in order to indicate improved independence with ADLs.  Baseline:  Goal status: REVISED    4.  Will improve BERG to >/=31/56 in order to indicate decreased fall risk.   Baseline: (08/12/2022) 27/56 Goal status: REVISED    5.  Will improve gait speed to >/=1.80 ft/sec with RW in order to indicate dec fall risk.  Baseline: 1.29 ft/sec (08/12/2022) Goal status: REVISED    6.  Pt will ambulate x 200' with RW and prosthesis at S in order to indicate improved household mobility.  Baseline: 200'  CGA Goal status: REVISED    UPDATED LONG TERM GOALS: Target date: 11/03/2021     Pt will be IND with final HEP in order to indicate improved functional mobility and dec fall risk. Baseline:  Goal status: ONGOING    2.  Pt will ambulate x 150' indoors and 300' outdoors over unlevel paved surfaces with RW at S level in order to indicate improved  household and community mobility.  Baseline:  Goal status: ONGOING    3.  Pt will negotiate up/down curb/ramp with RW and 4 steps with rails at S level in order to indicate improved community access.   Baseline:  Goal status: ONGOING      5.  Pt will increase BERG balance score to >/=36/56 to demonstrate improved static balance.  Baseline: 27/56 (11/21) Goal status: REVISED    6.  Pt will demonstrate a gait speed of >/=2.4 feet/sec in order to decrease risk for falls. Baseline: 1.29 ft/sec w/ RW (11/21) Goal status: REVISED    7.  Pt will don/doff prosthesis at S level and tolerate wearing prosthesis >/=90% of awake time in order to indicate more independent prosthetic care.   Baseline:  Goal status: ONGOING   8.   Will initiate gait training with quad tip cane when appropriate and will update goal.    Baseline:   Goal Status: New     PLAN: PT FREQUENCY: 2x/week   PT DURATION: 8 weeks   PLANNED INTERVENTIONS: Therapeutic exercises, Therapeutic activity, Neuromuscular re-education, Balance training, Gait training, Patient/Family education, Self Care, Joint mobilization, Stair training, Vestibular training, Prosthetic training, DME instructions, and Manual therapy   PLAN FOR NEXT SESSION: [facility] appt 12/26 after PT appt-what adjustments did they make (flexion of socket for heel touch w/ gait, trim posterior socket?) Assess gait speed with RW-update old goals -can delete at following visit, gait outdoors as able,  Do hip and hamstring stretch, seated scifit, Is she wearing 6 hours total per day?? How is hip stretch going? Continue to check  skin.  Continue prosthetic education.  Prosthetic gait, balance, SLS tasks.  have her work on donning/doffing prosthesis.  Add to standing exercises.    Elease Etienne, PT, [facility] [address] [place], [place], [address] Phone: [phone]   Fax:  [phone] 09/09/22, 3:52 PM

## 2022-09-09 NOTE — Telephone Encounter (Signed)
Patient diagnosed with Covid for Cough and Congestion Started oN paxlovid  Hold statin while on Paxlovid

## 2022-09-11 ENCOUNTER — Ambulatory Visit: Payer: Medicare Other | Admitting: Physical Therapy

## 2022-09-13 ENCOUNTER — Telehealth: Payer: Self-pay | Admitting: Nurse Practitioner

## 2022-09-13 NOTE — Telephone Encounter (Signed)
Called CBG 500s, currently COVID infection, reported oral intake as usual, ordered Humalog 5U stat, check CBG 2 hours, call back if CBG>500 or <150.

## 2022-09-16 ENCOUNTER — Ambulatory Visit: Payer: Medicare Other | Admitting: Physical Therapy

## 2022-09-18 ENCOUNTER — Ambulatory Visit: Payer: Medicare Other | Admitting: Physical Therapy

## 2022-09-23 ENCOUNTER — Encounter: Payer: Self-pay | Admitting: Physical Therapy

## 2022-09-23 ENCOUNTER — Ambulatory Visit: Payer: Medicare Other | Attending: Student | Admitting: Physical Therapy

## 2022-09-23 DIAGNOSIS — M6281 Muscle weakness (generalized): Secondary | ICD-10-CM

## 2022-09-23 DIAGNOSIS — R293 Abnormal posture: Secondary | ICD-10-CM | POA: Diagnosis present

## 2022-09-23 DIAGNOSIS — R2681 Unsteadiness on feet: Secondary | ICD-10-CM

## 2022-09-23 DIAGNOSIS — R2689 Other abnormalities of gait and mobility: Secondary | ICD-10-CM | POA: Diagnosis present

## 2022-09-23 NOTE — Therapy (Signed)
OUTPATIENT PHYSICAL THERAPY TREATMENT NOTE   Patient Name: Caitlin Sharp MRN: [id number] DOB:Sep 25, 1950, 72 y.o., female Today's Date: 09/24/2022  PCP: Veleta Miners, MD REFERRING PROVIDER: Mechele Claude, PA-C  END OF SESSION:   PT End of Session - 09/23/22 0859     Visit Number 16    Number of Visits 30   per updated [place]   Date for PT Re-Evaluation 11/03/22   per updated [place] [employer] (10th visit PN needed)    PT Start Time 0856   pt late   PT Stop Time 0933    PT Time Calculation (min) 37 min    Equipment Utilized During Treatment Gait belt    Activity Tolerance Patient tolerated treatment well    Behavior During Therapy WFL for tasks assessed/performed               Past Medical History:  Diagnosis Date   Abnormal thyroid blood test    Per Records recevied from [facility], Dr.Pearson   Acute deep vein thrombosis of right peroneal vein ([place]) 10/05/2020   RLE DVT   Anxiety    Arthritis    RA   Coronary artery disease    Diabetes type I ([place])    Per [facility] New Patient Packet   Diabetic retinopathy ([place])    Per [facility] New Patient Packet   DKA (diabetic ketoacidosis) ([place]) 2021   Dr.William Zackowski.  Insulin Pump was out of Insulin   Glaucoma    Per [place] Patient Packet   H/O heart bypass surgery 2012   Dr. Luana Shu, Per [facility] New Patient Packet   Heart murmur    when I was a teenager   History of blood transfusion    History of kidney stones    passed   Hx of CABG    Per records received form [facility], Dr.Pearson    Left bundle branch block    Mild mitral regurgitation    Per records from [facility], [place]    Neuropathy 1976   Per [place] Patient Packet   NSTEMI (non-ST elevated myocardial infarction) ([place]) 09/21/2020   NSTEMI/demand ischemic in setting of DKA, patent grafts by 09/24/20 [facility], continue medical therapy   Osteoporosis    T Score -2.7 , Per [place] New Patient Packet   Proliferative diabetic retinopathy of both eyes  associated with diabetes mellitus due to underlying condition [facility])    Per Records recevied from [facility], Dr.Pearson   PVD (peripheral vascular disease) ([place]) 10/27/2021   Type 1 diabetes mellitus with hyperglycemia ([place]) 10/28/2021   Past Surgical History:  Procedure Laterality Date   ABDOMINAL AORTOGRAM W/LOWER EXTREMITY N/A 10/02/2021   Procedure: ABDOMINAL AORTOGRAM W/LOWER EXTREMITY;  Surgeon: Broadus John, MD;  Location: [place] [facility];  Service: Cardiovascular;  Laterality: N/A;   AMPUTATION Right 04/17/2021   Procedure: RIGHT GREAT TOE AMPUTATION;  Surgeon: Newt Minion, MD;  Location: [place];  Service: Orthopedics;  Laterality: Right;   AMPUTATION Right 07/19/2021   Procedure: RIGHT FOOT 1ST RAY AMPUTATION;  Surgeon: Newt Minion, MD;  Location: [place];  Service: Orthopedics;  Laterality: Right;   AMPUTATION Right 02/06/2022   Procedure: AMPUTATION BELOW KNEE;  Surgeon: Wylene Simmer, MD;  Location: [place];  Service: Orthopedics;  Laterality: Right;  regional block   CESAREAN SECTION  1970   Dr. Moses Manners, Per [facility] New Patient Packet   CESAREAN SECTION  1973   Dr. 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) 07/29/2022   Ischemic cardiomyopathy 07/29/2022   Moderate episode of recurrent major depressive disorder (HCC) 07/29/2022   Anemia of chronic disease 07/29/2022   S/P BKA (below knee amputation) unilateral, right (HCC) 07/29/2022   Daytime sleepiness 07/29/2022   Cognitive impairment 07/29/2022   Osteomyelitis (HCC) 02/06/2022   Osteomyelitis of right foot (HCC) 02/06/2022   Type 1 diabetes mellitus with hyperglycemia (HCC) 10/28/2021   PVD (peripheral vascular disease) (HCC) 10/27/2021   Osteomyelitis of great toe of right foot (HCC)    NSVT (nonsustained ventricular tachycardia) (HCC) 01/17/2021   Acute deep vein thrombosis  (DVT) of right peroneal vein (HCC) 10/17/2020   Insulin long-term use (HCC) 07/18/2020   Bruit of left carotid artery 10/31/2019   Coronary artery disease involving native coronary artery of native heart without angina pectoris 10/31/2019   Open-angle glaucoma of right eye 10/31/2019   Dry eyes 03/01/2018   Presence of intraocular lens 10/18/2015   Cystoid macular edema 06/14/2012    REFERRING DIAG: Z89.511 (ICD-10-CM) - Acquired absence of right leg below knee Z47.89 (ICD-10-CM) - Encounter for prosthetic gait training    THERAPY DIAG:  Unsteadiness on feet  Abnormal posture  Other abnormalities of gait and mobility  Muscle weakness (generalized)  Rationale for Evaluation and Treatment Rehabilitation  PERTINENT HISTORY: Hx of BKA to the right leg due to osteomyelitis 02/06/22. Worked with therapy and then transferred to assisted living. She has been a Type 1 DM for over 50 years. Used to be on a insulin pump now on lantus and meal coverage.  Recently saw endocrinology and they lowered her lantus as she voiced concern for hypoglycemia. A1C 8.6 on 04/22/22 which is improved  She keeps getting skin tears from hitting objects such as her WC She is awaiting a prosthesis from [facility].  Was treated for thigh cellulitis due to an infected skin tear with Doxycycline on 04/29/22.  She reports the area is much improved. Now has a skin tear to the LLE that has some mild yellow drainage and redness per nursing staff that is concerning.   PRECAUTIONS: Fall  SUBJECTIVE:                                                                                                                                                                                      SUBJECTIVE STATEMENT:   Pt returns following COVID.  She has been in the COVID unit and not allowed to leave the room so she wore  the prosthetic intermittently.  She wore it a few hours yesterday and walked the hall twice.  Son reports she was sitting and  wearing the prosthetic about 6 hours on New Years Eve.  Pt reports residual limb was a little sore following weight-bearing yesterday.  PAIN:  Are you having pain? No   OBJECTIVE: (objective measures completed at initial evaluation unless otherwise dated)  TODAY'S TREATMENT:  CURRENT PROSTHETIC WEAR ASSESSMENT: Patient is independent with:  She is now able to assist some with doffing leg, donning socks;  Patient is dependent with: skin check, residual limb care, prosthetic cleaning, ply sock cleaning, correct ply sock adjustment, proper wear schedule/adjustment, proper weight-bearing schedule/adjustment Donning prosthesis: min A (to assist with aligning pin) Doffing prosthesis: S Prosthetic wear tolerance:  Pt is wearing 6 hours a day more consistently (wore about 6 hours Saturday and Sunday per patient report, but did not wear yesterday due to wound on leg.). Prosthetic weight bearing tolerance: approx 30 minutes at a time; continuing to vary due to recent COVID Edema: none noted Residual limb condition: Scabbed area from previous wound on distal end of limb, healing well, wearing vivewear shrinker.   Prosthetic description: Pin lock suspension K code/activity level with prosthetic use: Level 3  Pt presents today with vivewear under liner, and 1 ply sock.  PT attempted to speak with [facility] to re-schedule appt from 09/16/2022 at 11am w/ Bobby-left note with receptionist about flexion of socket so heel touches ground and to potentially trim the back of socket where markings are and re-iterated to son, son aware and both pt and son agreeable to appt.  Marland Kitchen  GAIT: Gait pattern: step to pattern, step through pattern, decreased stride length, lateral hip instability, trunk flexed, and narrow BOS Distance walked: 266'  Assistive device utilized: Environmental consultant - 2 wheeled Level of assistance: SBA and CGA Comments: Pt having increased right drift during last ~50' w/ CGA and inc cuing to correct.   Improved fluidity of turns w/ RW management, but continued right knee flexion during stance as well as decreased heel strike.    -10MWT w/ RW:  18.69 sec = 0.54 m/sec OR 1.77 ft/sec  -Standing w/ BUE support forward and crossing midline cone taps > unilateral UE support cued for upright posture and increased right hip flexion to improve success of task  PATIENT EDUCATED ON FOLLOWING PROSTHETIC CARE: Prosthetic wear tolerance: 4 hours/day, 7 days/week; Discussed progressing towards 6 hours per day, continues to be somewhat inconsistent-PT unsure of wear schedule as pt stating this varies depending on what she has to do during the day, son unsure as of late due to staffing inconsistency at facility.  Discussed son speaking to facility about specific donning time and then ambulation time and then doffing time everyday w/ some flexibility as needed by staff. Prosthetic weight bearing tolerance: 10-15 mins Other education  Skin check, Residual limb care, Care of non-amputated limb, Prosthetic cleaning, Ply sock cleaning, Correct ply sock adjustment, Propper donning, Proper doffing, Proper wear schedule/adjustment, and Proper weight-bearing schedule/adjustment    PATIENT EDUCATION:  Continue with HEP (especially stretching hamstrings and hip flexors to improve quality of gait) and walking as able.  Answered questions related to weight-bearing and wear schedule (discussed consistency with facility donning prosthesis and wearing for same amount of time everyday vs going straight to all awake hours-son's goal).  HOME EXERCISE PROGRAM:  Access Code: [email or web address] URL: [email or web address] Date: 09/02/2022 Prepared by: Cameron Sprang  Exercises - Side Stepping with Counter Support  -  1 x daily - 7 x weekly - 2 sets - 10 reps - Standing Hip Abduction with Counter Support  - 1 x daily - 7 x weekly - 2 sets - 10 reps - Standing Thoracic and Cervical Rotation with Reach at Wall  - 1 x daily - 7 x  weekly - 2 sets - 10 reps - Supine Hip Flexor Stretch with Weight  - 1 x daily - 7 x weekly - 1 sets - 2-3 reps - 30-60 secs hold - Seated Table Hamstring Stretch  - 1 x daily - 7 x weekly - 1 sets - 3 reps - 30 hold  Performed bolded exercises 12/12  INSTRUCTIONS FOR CNAS ON DONNING/DOFFING PROSTHESIS:   1:  WHEN WEARING PROSTHESIS, DON SNUG (SMALLER) BLACK CUT OFF SOCK FIRST UNDER SILICONE LINER.  HAVE HER SCOOT TO EDGE OF CHAIR.  THIS SHOULD BE DIRECTLY ON SKIN.  IF SHE IS HAVING BLEEDING OR IT GETS SOILED, SWITCH TO HER OTHER BLACK CUT OFF SOCK.  WHEN NOT WEARING PROSTHESIS, WEAR BLACK CUT OFF SOCK THAT IS LARGER WITH GRAY SHRINKER WITH RING.    2: INVERT LINER AND ENSURE THAT END IS AS FLAT AS POSSIBLE AND ENSURE PIN IS IN LINE WITH BONE.     3: WHEN DONNING WHITE SOCKS, HAVE HER TRY AND BUNDLE UP AND PLACE MIDDLE FINGERS IN HOLE SO THAT SHE KNOWS WHERE HOLE IS WHEN PUTTING ON.     3: PLACE PROSTHESIS ON FLOOR AND TIP BACK ON HEEL, HAVE HER PLACE LIMB INSIDE TO HEAR AT LEAST ONE CLICK PRIOR TO STANDING.  WORK ON HAVING HER PUSH HER KNEE DOWN INTO SOCKET.    4:  CAN PLACE SMALL DOT OF BABY/MINERAL OIL ON KNEE CAP AND RUB UPWARD TO PREVENT FRICTION RUBBING FROM LINER.    5: BEGIN WEARING PROSTHESIS 2 HOURS ON THEN DO 2 HOURS OFF, THEN 2 HOURS ON (CAN DO 2-3 TIMES A DAY LIKE THIS).  AFTER A WEEK, IF SKIN STILL LOOKS GOOD/HEALING, INCREASE TO 3 HOURS ON, 2 HOURS OFF, 3 HOURS ON.  (CAN DO 2-3 TIMES PER DAY AS WELL).          ASSESSMENT:   CLINICAL IMPRESSION: Session limited as pt late this visit.  She returns to PT recovered from recent episode of COVID and does not appear much physically changed in relation to prosthetic care, schedules, or skin integrity.  She continues to ambulate with flexed residual limb posture and requires further work on activity tolerance and general dynamic balance w/ LRAD.  Will continue POC.   OBJECTIVE IMPAIRMENTS Abnormal gait, decreased activity  tolerance, decreased balance, decreased endurance, decreased knowledge of use of DME, decreased mobility, difficulty walking, decreased ROM, decreased strength, impaired perceived functional ability, impaired flexibility, postural dysfunction, and prosthetic dependency .    ACTIVITY LIMITATIONS carrying, lifting, bending, standing, squatting, stairs, transfers, bathing, toileting, dressing, reach over head, hygiene/grooming, and locomotion level   PARTICIPATION LIMITATIONS: cleaning, laundry, driving, shopping, and community activity   PERSONAL FACTORS 3+ comorbidities: see above  are also affecting patient's functional outcome.    REHAB POTENTIAL: Good   CLINICAL DECISION MAKING: Evolving/moderate complexity   EVALUATION COMPLEXITY: Moderate     GOALS: Goals reviewed with patient? Yes   UPDATED SHORT TERM GOALS: Target date: 10/03/2021     Pt/caregiver will be IND with ongoing HEP in order to indicate improved functional mobility and dec fall risk. Baseline: Goal status: ONGOING    2.  Pt will be able to  don/doff prosthesis consistently at S level and tolerate 6 hours of wear time per day without skin break down. Baseline: Intermittent min A  Goal status: REVISED   3.  Pt will tolerate standing x 15 mins with intermittent support at S level in order to indicate improved independence with ADLs.  Baseline:  Goal status: REVISED    4.  Will improve BERG to >/=31/56 in order to indicate decreased fall risk.   Baseline: (08/12/2022) 27/56 Goal status: REVISED    5.  Will improve gait speed to >/=1.80 ft/sec with RW in order to indicate dec fall risk.  Baseline: 1.29 ft/sec (08/12/2022); 1.77 ft/sec (09/23/2022) Goal status: REVISED    6.  Pt will ambulate x 200' with RW and prosthesis at S in order to indicate improved household mobility.  Baseline: 200' CGA Goal status: REVISED    UPDATED LONG TERM GOALS: Target date: 11/03/2021     Pt will be IND with final HEP in order to  indicate improved functional mobility and dec fall risk. Baseline:  Goal status: ONGOING    2.  Pt will ambulate x 150' indoors and 300' outdoors over unlevel paved surfaces with RW at S level in order to indicate improved household and community mobility.  Baseline:  Goal status: ONGOING    3.  Pt will negotiate up/down curb/ramp with RW and 4 steps with rails at S level in order to indicate improved community access.   Baseline:  Goal status: ONGOING      5.  Pt will increase BERG balance score to >/=36/56 to demonstrate improved static balance.  Baseline: 27/56 (11/21) Goal status: REVISED    6.  Pt will demonstrate a gait speed of >/=2.4 feet/sec in order to decrease risk for falls. Baseline: 1.29 ft/sec w/ RW (11/21); 1.77 ft/sec (1/2) Goal status: REVISED    7.  Pt will don/doff prosthesis at S level and tolerate wearing prosthesis >/=90% of awake time in order to indicate more independent prosthetic care.   Baseline:  Goal status: ONGOING   8.   Will initiate gait training with quad tip cane when appropriate and will update goal.    Baseline:   Goal Status: New     PLAN: PT FREQUENCY: 2x/week   PT DURATION: 8 weeks   PLANNED INTERVENTIONS: Therapeutic exercises, Therapeutic activity, Neuromuscular re-education, Balance training, Gait training, Patient/Family education, Self Care, Joint mobilization, Stair training, Vestibular training, Prosthetic training, DME instructions, and Manual therapy   PLAN FOR NEXT SESSION: Did son reschedule [facility] appt?-what adjustments did they make (flexion of socket for heel touch w/ gait, trim posterior socket?), gait outdoors as able,  continue hip and hamstring stretching, seated scifit, Is she wearing 6 hours total per day?? How is hip stretch going? Continue to check skin.  Continue prosthetic education.  Prosthetic gait, balance, SLS tasks.  have her work on donning/doffing prosthesis.  Add to standing exercises.    Camille Bal, PT, DPT [facility] [address] [place], [place], [address] Phone: [phone]   Fax:  [phone] 09/24/22, 9:26 AM

## 2022-09-24 ENCOUNTER — Encounter: Payer: Self-pay | Admitting: Orthopedic Surgery

## 2022-09-24 ENCOUNTER — Non-Acute Institutional Stay: Payer: Medicare Other | Admitting: Orthopedic Surgery

## 2022-09-24 DIAGNOSIS — F331 Major depressive disorder, recurrent, moderate: Secondary | ICD-10-CM

## 2022-09-24 DIAGNOSIS — U071 COVID-19: Secondary | ICD-10-CM

## 2022-09-24 MED ORDER — BUPROPION HCL ER (XL) 150 MG PO TB24: 150.0000 mg | ORAL_TABLET | Freq: Every day | ORAL | 1 refills | Status: DC

## 2022-09-24 NOTE — Progress Notes (Signed)
Location:   [facility] Room Number: 628 Place of Service:  ALF [phone]) Provider:  Hazle Nordmann, NP  [name], MD  Patient Care Team: [name], MD as PCP - General (Internal Medicine) [name], MD as Referring Physician (Ophthalmology) Beverely Pace, [name], MD as Referring Physician (Cardiology) Bertram Gala, MD as Referring Physician (Cardiology)  Extended Emergency Contact Information Primary Emergency Contact: [name] Address: [address]          [place], [place] [address] [name] of [place] Home Phone: [phone] Mobile Phone: [phone] Relation: Son Preferred language: English Interpreter needed? No Secondary Emergency Contact: [name],[name] Mobile Phone: [phone] Relation: Relative  Code Status:  FULL CODE Goals of care: Advanced Directive information    09/24/2022    2:40 PM  Advanced Directives  Does Patient Have a Medical Advance Directive? Yes  Type of Advance Directive Healthcare Power of Attorney  Does patient want to make changes to medical advance directive? No - Patient declined  Copy of Healthcare Power of Attorney in Chart? Yes - validated most recent copy scanned in chart (See row information)     Chief Complaint  Patient presents with   Acute Visit    Depression    HPI:  Pt is a 72 y.o. female seen today for acute visit due to depression.   She currently resides on the assisted living unit at [facility]. PMH: DVT, CAD, cardiomyopathy, PVD, T1DM, osteomyelitis, R BKA, anemia, depression, and unstable gait.    She reports increased depression in the past month. She is sleeping more and less interested in doing things. She recently had covid 19 and was on isolation x 10 days. She is off isolation at this time, but reports her depression has been worse since that experience. 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  Since amputation, she feels her health is failing and upset at times and upset others her age are so active. PHQ score 10 today. Denies plans to harm herself.   Past Medical History:  Diagnosis Date   Abnormal thyroid blood test    Per Records recevied from [facility], Dr.Pearson   Acute deep vein thrombosis of right peroneal vein (HCC) 10/05/2020   RLE DVT   Anxiety    Arthritis    RA   Coronary artery disease    Diabetes type I [facility])    Per [facility] New Patient Packet   Diabetic retinopathy (HCC)    Per [facility] New Patient Packet   DKA (diabetic ketoacidosis) (HCC) 2021   Dr.William Zackowski.  Insulin Pump was out of Insulin   Glaucoma    Per [facility] New Patient Packet   H/O heart bypass surgery 2012   Dr. Excell Seltzer, Per [facility] New Patient Packet   Heart murmur    when I was a teenager   History of blood transfusion    History of kidney stones    passed   Hx of CABG    Per records received form [facility], Dr.Pearson    Left bundle branch block    Mild mitral regurgitation    Per records from [facility], Dr.Pearson    Neuropathy 1976   Per [facility] New Patient Packet   NSTEMI (non-ST elevated myocardial infarction) (HCC) 09/21/2020   NSTEMI/demand ischemic in setting of DKA, patent grafts by 09/24/20 [facility], continue medical therapy   Osteoporosis    T Score -2.7 , Per [facility] New Patient Packet  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 due to underlying condition [facility])    Per Records recevied from [facility], Dr.Pearson   PVD (peripheral vascular disease) (HCC) 10/27/2021   Type 1 diabetes mellitus with hyperglycemia (HCC) 10/28/2021   Past Surgical History:  Procedure Laterality Date   ABDOMINAL AORTOGRAM W/LOWER EXTREMITY N/A 10/02/2021   Procedure: ABDOMINAL AORTOGRAM W/LOWER EXTREMITY;  Surgeon: Victorino Sparrow, MD;  Location: [facility] INVASIVE CV LAB;  Service: Cardiovascular;  Laterality: N/A;   AMPUTATION Right 04/17/2021   Procedure: RIGHT GREAT TOE  AMPUTATION;  Surgeon: [name], MD;  Location: [facility] OR;  Service: Orthopedics;  Laterality: Right;   AMPUTATION Right 07/19/2021   Procedure: RIGHT FOOT 1ST RAY AMPUTATION;  Surgeon: [name], MD;  Location: [facility] OR;  Service: Orthopedics;  Laterality: Right;   AMPUTATION Right 02/06/2022   Procedure: AMPUTATION BELOW KNEE;  Surgeon: Toni Arthurs, MD;  Location: [facility] OR;  Service: Orthopedics;  Laterality: Right;  regional block   CESAREAN SECTION  1970   Dr. Aquilla Hacker, Per [facility] New Patient Packet   CESAREAN SECTION  1973   Dr. Aquilla Hacker, Per [facility] New Patient Packet   CORONARY ARTERY BYPASS GRAFT      Allergies  Allergen Reactions   Penicillins Anaphylaxis    Allergies as of 09/24/2022       Reactions   Penicillins Anaphylaxis        Medication List        Accurate as of September 24, 2022  2:58 PM. If you have any questions, ask your nurse or doctor.          acetaminophen 500 MG tablet Commonly known as: TYLENOL Take 1,000 mg by mouth 2 (two) times daily as needed for moderate pain.   atorvastatin 40 MG tablet Commonly known as: LIPITOR Take 40 mg by mouth at bedtime.   brimonidine 0.2 % ophthalmic solution Commonly known as: ALPHAGAN Place 1 drop into the left eye 3 (three) times daily.   calcium carbonate 1500 (600 Ca) MG Tabs tablet Commonly known as: OSCAL Take 600 mg of elemental calcium by mouth daily.   clopidogrel 75 MG tablet Commonly known as: PLAVIX Take 75 mg by mouth in the morning.   docusate sodium 100 MG capsule Commonly known as: COLACE Take 100 mg by mouth as needed for mild constipation.   Fish Oil 1000 MG Caps Take 1,000 mg by mouth in the morning.   FreeStyle Libre 14 Day Sensor Misc Inject 1 sensor to the skin every 14 days for continuous glucose monitoring.   glucosamine-chondroitin 500-400 MG tablet Take 1 tablet by mouth every morning.   Glucose 2 g Chew Chew 1 tablet by mouth as needed. Give one tablet for blood sugar <  70, may repeat every 15 mins until blood glucose > 70, may give with juice and other foods. What changed: Another medication with the same name was removed. Continue taking this medication, and follow the directions you see here. Changed by: Octavia Heir, NP   HAIR/SKIN/NAILS PO Take 1 tablet by mouth in the morning.   I-VITE PO Take 1 tablet by mouth in the morning.   insulin lispro 100 UNIT/ML KwikPen Commonly known as: HUMALOG Inject 5-15 Units into the skin See admin instructions. If blood sugar is 151-200 = 5 units, 201-250 = 8 units, 251-300  = 10 units, 301-350 = 12 units, 351-400 = 14 units, 401 and above =  16.  If  blood sugar is greater than 200 inject 5 units at bedtime, if greater than 500 call on call provider What changed: Another medication with the same name was removed. Continue taking this medication, and follow the directions you see here. Changed by: Yvonna Alanis, NP   Lantus SoloStar 100 UNIT/ML Solostar Pen Generic drug: insulin glargine Inject 12 Units into the skin daily. In the morning. PATIENT SELF ADMINISTERS   insulin glargine 100 UNIT/ML injection Commonly known as: LANTUS Inject 3 Units into the skin daily. 9:00 pm-10:00 pm   latanoprost 0.005 % ophthalmic solution Commonly known as: XALATAN Place 1 drop into both eyes every evening.   lisinopril 2.5 MG tablet Commonly known as: ZESTRIL Take 2.5 mg by mouth in the morning.   miconazole 200 MG vaginal suppository Commonly known as: MICOTIN Place 200 mg vaginally at bedtime as needed (vaginal itching).   nitroGLYCERIN 0.4 MG SL tablet Commonly known as: NITROSTAT Place 0.4 mg under the tongue every 5 (five) minutes as needed for chest pain.   omeprazole 20 MG capsule Commonly known as: PRILOSEC Take 20 mg by mouth daily.   Pen Needles 3/16" 31G X 5 MM Misc 1 Device by Does not apply route 5 (five) times daily. With insulin administration   sennosides-docusate sodium 8.6-50 MG tablet Commonly  known as: SENOKOT-S Take 1 tablet by mouth every other day. IN THE MORNING   timolol 0.5 % ophthalmic solution Commonly known as: TIMOPTIC Place 1 drop into the left eye 2 (two) times daily.        Review of Systems  Constitutional:  Positive for fatigue. Negative for fever.  HENT:  Negative for congestion and sore throat.   Respiratory:  Negative for cough, shortness of breath and wheezing.   Cardiovascular:  Negative for chest pain and leg swelling.  Gastrointestinal:  Negative for nausea and vomiting.  Musculoskeletal:  Negative for myalgias.  Neurological:  Positive for weakness. Negative for dizziness and headaches.  Psychiatric/Behavioral:  Positive for dysphoric mood. Negative for confusion and sleep disturbance. The patient is not nervous/anxious.     Immunization History  Administered Date(s) Administered   Influenza, High Dose Seasonal PF 09/01/2005   PFIZER(Purple Top)SARS-COV-2 Vaccination 12/02/2019, 12/26/2019   Tdap 07/11/2014   Pertinent  Health Maintenance Due  Topic Date Due   FOOT EXAM  Never done   OPHTHALMOLOGY EXAM  Never done   COLONOSCOPY (Pts 45-68yrs Insurance coverage will need to be confirmed)  Never done   MAMMOGRAM  Never done   INFLUENZA VACCINE  04/22/2022   HEMOGLOBIN A1C  07/27/2022   DEXA SCAN  Completed      02/06/2022    8:10 PM 02/07/2022   11:00 AM 04/21/2022    2:53 PM 07/29/2022    9:15 AM 08/12/2022   11:48 AM  [place] in the past year?   0 1 1  Was there an injury with Fall?   0 1 1  Fall Risk Category Calculator   0 3 3  Fall Risk Category   Low High High  Patient Fall Risk Level High fall risk High fall risk Low fall risk High fall risk High fall risk  Patient at Risk for Falls Due to   No Fall Risks History of fall(s);Impaired balance/gait;Impaired mobility;Impaired vision History of fall(s);Impaired balance/gait;Impaired mobility;Impaired vision  Fall risk Follow up   Falls evaluation completed Falls evaluation  completed Falls evaluation completed   Functional Status Survey:    Vitals:   09/24/22  1434  BP: (!) 150/84  Pulse: 83  Resp: 17  Temp: 97.9 F (36.6 C)  SpO2: 99%  Weight: 124 lb 12.8 oz (56.6 kg)  Height: 5\' 7"  (1.702 m)   Body mass index is 19.55 kg/m. Physical Exam Vitals reviewed.  Constitutional:      General: She is not in acute distress. HENT:     Head: Normocephalic.     Right Ear: There is no impacted cerumen.     Left Ear: There is no impacted cerumen.     Nose: Nose normal.     Mouth/Throat:     Mouth: Mucous membranes are moist.  Eyes:     General:        Right eye: No discharge.        Left eye: No discharge.  Cardiovascular:     Rate and Rhythm: Normal rate and regular rhythm.     Pulses: Normal pulses.     Heart sounds: Normal heart sounds.  Pulmonary:     Effort: Pulmonary effort is normal. No respiratory distress.     Breath sounds: Normal breath sounds. No wheezing.  Abdominal:     General: Bowel sounds are normal. There is no distension.     Tenderness: There is no abdominal tenderness.  Musculoskeletal:     Cervical back: Neck supple.     Left lower leg: No edema.     Comments: Right BKA  Skin:    General: Skin is warm and dry.     Capillary Refill: Capillary refill takes less than 2 seconds.  Neurological:     General: No focal deficit present.     Mental Status: She is alert and oriented to person, place, and time.     Motor: Weakness present.     Gait: Gait abnormal.  Psychiatric:        Mood and Affect: Mood is depressed. Affect is tearful.     Labs reviewed: Recent Labs    10/28/21 1039 12/18/21 0000 02/06/22 0612 02/07/22 0222 02/11/22 0000 02/20/22 0000 02/20/22 0247 04/22/22 0000 07/17/22 0000 08/09/22 1226  NA 138   < > 137 137   < > 143  --  136* 143 133*  K 4.2   < > 4.3 4.4   < > 4.3  --  4.7 4.7 4.5  CL 100   < > 101 104   < > 103  --  98* 106 97*  CO2 32   < > 27 27   < > 26*  --  24* 29* 24*  GLUCOSE 249*   --  277* 248*  --   --   --   --   --   --   BUN 24   < > 24* 22   < > 23*  --  34* 25* 25*  CREATININE 0.99   < > 0.79 0.86   < > 0.7  --  0.9 1.0  --   CALCIUM 9.8   < > 9.4 8.5*   < >  --    < > 9.6 9.2 9.3   < > = values in this interval not displayed.   Recent Labs    10/28/21 1039 12/18/21 0000 04/22/22 0000 07/17/22 0000  AST 19 14 18 18   ALT 17 9 15 13   ALKPHOS  --  139* 118 65  BILITOT 0.4  --   --   --   PROT 6.9  --   --   --  ALBUMIN  --  3.2*  --  3.7   Recent Labs    10/28/21 1039 12/18/21 0000 02/06/22 0612 02/07/22 0222 02/11/22 0000 03/17/22 0000 04/22/22 0000 07/17/22 0000  WBC 6.5   < > 7.7 7.6   < > 6.4 5.5 5.2  NEUTROABS 3,562  --   --   --   --   --  3.20  --   HGB 10.4*   < > 10.2* 8.5*   < > 10.3* 11.2* 10.7*  HCT 32.3*   < > 31.7* 26.6*   < > 31* 33* 32*  MCV 88.7  --  88.5 87.5  --   --   --   --   PLT 286   < > 367 325   < > 288 262 266   < > = values in this interval not displayed.   Lab Results  Component Value Date   TSH 3.78 04/22/2022   Lab Results  Component Value Date   HGBA1C 12.7 01/24/2022   Lab Results  Component Value Date   CHOL 173 04/22/2022   HDL 81 (A) 04/22/2022   LDLCALC 76 04/22/2022   TRIG 77 04/22/2022    Significant Diagnostic Results in last 30 days:  No results found.  Assessment/Plan 1. Moderate episode of recurrent major depressive disorder ([place]) - increased sleeping/ loss of interest/ crying x 1 month> worse after covid isolation - also had right BKA 01/2022 - PHQ score 10 today - start Wellbutrin 150 XL mg po QAM  2. COVID-19 - 12/19 tested positive  - completed paxlovid - no long term symptoms at this time    Family/ staff Communication: plan discussed with patient and nurse  Labs/tests ordered: none

## 2022-09-24 NOTE — Telephone Encounter (Signed)
This encounter was created in error - please disregard.

## 2022-09-25 ENCOUNTER — Ambulatory Visit: Payer: Medicare Other | Admitting: Rehabilitation

## 2022-09-30 ENCOUNTER — Ambulatory Visit: Payer: Medicare Other | Admitting: Physical Therapy

## 2022-09-30 ENCOUNTER — Non-Acute Institutional Stay: Payer: Medicare Other | Admitting: Orthopedic Surgery

## 2022-09-30 ENCOUNTER — Encounter: Payer: Self-pay | Admitting: Orthopedic Surgery

## 2022-09-30 ENCOUNTER — Encounter: Payer: Self-pay | Admitting: Physical Therapy

## 2022-09-30 DIAGNOSIS — R2681 Unsteadiness on feet: Secondary | ICD-10-CM

## 2022-09-30 DIAGNOSIS — S80811S Abrasion, right lower leg, sequela: Secondary | ICD-10-CM | POA: Diagnosis not present

## 2022-09-30 DIAGNOSIS — R293 Abnormal posture: Secondary | ICD-10-CM

## 2022-09-30 DIAGNOSIS — M6281 Muscle weakness (generalized): Secondary | ICD-10-CM

## 2022-09-30 DIAGNOSIS — R2689 Other abnormalities of gait and mobility: Secondary | ICD-10-CM

## 2022-09-30 DIAGNOSIS — F331 Major depressive disorder, recurrent, moderate: Secondary | ICD-10-CM

## 2022-09-30 NOTE — Addendum Note (Signed)
Addended byWindell Moulding E on: 09/30/2022 02:55 PM   Modules accepted: Level of Service

## 2022-09-30 NOTE — Therapy (Signed)
OUTPATIENT PHYSICAL THERAPY TREATMENT NOTE   Patient Name: Caitlin Sharp MRN: [id number] DOB:04/10/1951, 72 y.o., female Today's Date: 09/30/2022  PCP: Veleta Miners, MD REFERRING PROVIDER: Mechele Claude, PA-C  END OF SESSION:   PT End of Session - 09/30/22 0945     Visit Number 17    Number of Visits 30   per updated [place]   Date for PT Re-Evaluation 11/03/22   per updated [place] [employer] (10th visit PN needed)    PT Start Time 0935    Equipment Utilized During Treatment Gait belt    Activity Tolerance Patient tolerated treatment well    Behavior During Therapy [facility] for tasks assessed/performed               Past Medical History:  Diagnosis Date   Abnormal thyroid blood test    Per Records recevied from [facility], Dr.Pearson   Acute deep vein thrombosis of right peroneal vein ([place]) 10/05/2020   RLE DVT   Anxiety    Arthritis    RA   Coronary artery disease    Diabetes type I ([place])    Per [facility] New Patient Packet   Diabetic retinopathy ([place])    Per [facility] New Patient Packet   DKA (diabetic ketoacidosis) ([place]) 2021   Dr.William Zackowski.  Insulin Pump was out of Insulin   Glaucoma    Per [place] Patient Packet   H/O heart bypass surgery 2012   Dr. Luana Shu, Per [facility] New Patient Packet   Heart murmur    when I was a teenager   History of blood transfusion    History of kidney stones    passed   Hx of CABG    Per records received form [facility], Dr.Pearson    Left bundle branch block    Mild mitral regurgitation    Per records from [facility], [place]    Neuropathy 1976   Per [place] Patient Packet   NSTEMI (non-ST elevated myocardial infarction) ([place]) 09/21/2020   NSTEMI/demand ischemic in setting of DKA, patent grafts by 09/24/20 [facility], continue medical therapy   Osteoporosis    T Score -2.7 , Per [place] New Patient Packet   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 due to underlying condition [facility])     Per Records recevied from [facility], Dr.Pearson   PVD (peripheral vascular disease) ([place]) 10/27/2021   Type 1 diabetes mellitus with hyperglycemia ([place]) 10/28/2021   Past Surgical History:  Procedure Laterality Date   ABDOMINAL AORTOGRAM W/LOWER EXTREMITY N/A 10/02/2021   Procedure: ABDOMINAL AORTOGRAM W/LOWER EXTREMITY;  Surgeon: Broadus John, MD;  Location: [place] CV [facility];  Service: Cardiovascular;  Laterality: N/A;   AMPUTATION Right 04/17/2021   Procedure: RIGHT GREAT TOE AMPUTATION;  Surgeon: Newt Minion, MD;  Location: [place];  Service: Orthopedics;  Laterality: Right;   AMPUTATION Right 07/19/2021   Procedure: RIGHT FOOT 1ST RAY AMPUTATION;  Surgeon: Newt Minion, MD;  Location: [place];  Service: Orthopedics;  Laterality: Right;   AMPUTATION Right 02/06/2022   Procedure: AMPUTATION BELOW KNEE;  Surgeon: Wylene Simmer, MD;  Location: [place];  Service: Orthopedics;  Laterality: Right;  regional block   CESAREAN SECTION  1970   Dr. Moses Manners, Per [facility] New Patient Packet   CESAREAN SECTION  1973   Dr. 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) 07/29/2022   Ischemic cardiomyopathy 07/29/2022   Moderate episode of recurrent major depressive disorder (HCC) 07/29/2022   Anemia of chronic disease 07/29/2022   S/P BKA (below knee amputation) unilateral, right (HCC) 07/29/2022   Daytime sleepiness 07/29/2022   Cognitive impairment 07/29/2022   Osteomyelitis (HCC) 02/06/2022   Osteomyelitis of right foot (HCC) 02/06/2022   Type 1 diabetes mellitus with hyperglycemia (HCC) 10/28/2021   PVD (peripheral vascular disease) (HCC) 10/27/2021   Osteomyelitis of great toe of right foot (HCC)    NSVT (nonsustained ventricular tachycardia) (HCC) 01/17/2021   Acute deep vein thrombosis (DVT) of right peroneal vein (HCC) 10/17/2020   Insulin long-term  use (HCC) 07/18/2020   Bruit of left carotid artery 10/31/2019   Coronary artery disease involving native coronary artery of native heart without angina pectoris 10/31/2019   Open-angle glaucoma of right eye 10/31/2019   Dry eyes 03/01/2018   Presence of intraocular lens 10/18/2015   Cystoid macular edema 06/14/2012    REFERRING DIAG: Z89.511 (ICD-10-CM) - Acquired absence of right leg below knee Z47.89 (ICD-10-CM) - Encounter for prosthetic gait training    THERAPY DIAG:  Unsteadiness on feet  Abnormal posture  Other abnormalities of gait and mobility  Muscle weakness (generalized)  Rationale for Evaluation and Treatment Rehabilitation  PERTINENT HISTORY: Hx of BKA to the right leg due to osteomyelitis 02/06/22. Worked with therapy and then transferred to assisted living. She has been a Type 1 DM for over 50 years. Used to be on a insulin pump now on lantus and meal coverage.  Recently saw endocrinology and they lowered her lantus as she voiced concern for hypoglycemia. A1C 8.6 on 04/22/22 which is improved  She keeps getting skin tears from hitting objects such as her WC She is awaiting a prosthesis from [facility].  Was treated for thigh cellulitis due to an infected skin tear with Doxycycline on 04/29/22.  She reports the area is much improved. Now has a skin tear to the LLE that has some mild yellow drainage and redness per nursing staff that is concerning.   PRECAUTIONS: Fall  SUBJECTIVE:                                                                                                                                                                                      SUBJECTIVE STATEMENT:   Pt returns with son.  Son states [facility] appt is Friday 10/03/2022 at 11am.  Son reports a wound on residual limb x4 days, states it is healing as they have been taking it easy with wearing the prosthetic and walking.  States wound occurred when pt was walking wearing no socks thus sinking too far into  socket and rubbing on prominent area of incision.  PAIN:  Are you having pain? No-pt states she feels pressure in area of wound when walking   OBJECTIVE: (objective measures completed at initial evaluation unless otherwise dated)  TODAY'S TREATMENT:  CURRENT PROSTHETIC WEAR ASSESSMENT: Patient is independent with:  She is now able to assist some with doffing leg, donning socks;  Patient is dependent with: skin check, residual limb care, prosthetic cleaning, ply sock cleaning, correct ply sock adjustment, proper wear schedule/adjustment, proper weight-bearing schedule/adjustment Donning prosthesis: min A (to assist with aligning pin) Doffing prosthesis: S Prosthetic wear tolerance:  Pt is wearing 6 hours a day more consistently (wore about 6 hours Saturday and Sunday per patient report, but did not wear yesterday due to wound on leg.). Prosthetic weight bearing tolerance: approx 30 minutes at a time (session prior), since wound developed PT recommended dropping weight bearing time to only when necessary until wound heals (for transfers and shortened walks using sock adjustment to reduce pressure). Edema: none noted Residual limb condition: Scabbed area on distal-lateral incision of residual limb, healing well, wearing vivewear.   Prosthetic description: Pin lock suspension K code/activity level with prosthetic use: Level 3  Pt presents today with gray vivewear under liner.  GAIT: Gait pattern: step to pattern, step through pattern, decreased stride length, lateral hip instability, trunk flexed, and narrow BOS Distance walked: 266'  Assistive device utilized: Environmental consultant - 2 wheeled Level of assistance: SBA and CGA Comments: Pt having increased right drift during last ~50' w/ CGA and inc cuing to correct.  Improved fluidity of turns w/ RW management, but continued right knee flexion during stance as well as decreased heel strike.    PATIENT EDUCATED ON FOLLOWING PROSTHETIC CARE: Prosthetic  wear tolerance: 4 hours/day, 7 days/week; Discussed progressing towards 6 hours per day, continues to be somewhat inconsistent-PT unsure of wear schedule as pt stating this varies depending on what she has to do during the day, son unsure as of late due to staffing inconsistency at facility.  Discussed son speaking to facility about specific donning time and then ambulation time and then doffing time everyday w/ some flexibility as needed by staff. Prosthetic weight bearing tolerance: 10-15 mins Other education  Skin check, Residual limb care, Care of non-amputated limb, Prosthetic cleaning, Ply sock cleaning, Correct ply sock adjustment, Propper donning, Proper doffing, Proper wear schedule/adjustment, and Proper weight-bearing schedule/adjustment    PATIENT EDUCATION:  Continue with HEP (especially stretching hamstrings and hip flexors to improve quality of gait) and walking as able.  Answered questions related to weight-bearing and wear schedule (discussed consistency with facility donning prosthesis and wearing for same amount of time everyday vs going straight to all awake hours-son's goal).  HOME EXERCISE PROGRAM:  Access Code: [email or web address] URL: [email or web address] Date: 09/02/2022 Prepared by: Harriet Butte  Exercises - Side Stepping with Counter Support  - 1 x daily - 7 x weekly - 2 sets - 10 reps - Standing Hip Abduction with Counter Support  - 1 x daily - 7 x weekly - 2 sets - 10 reps - Standing Thoracic and Cervical Rotation with Reach at Wall  - 1 x daily - 7 x weekly - 2 sets - 10 reps - Supine Hip Flexor Stretch with Weight  - 1 x daily - 7 x weekly - 1 sets - 2-3 reps - 30-60 secs hold - Seated Table Hamstring Stretch  -  1 x daily - 7 x weekly - 1 sets - 3 reps - 30 hold  Performed bolded exercises 12/12  INSTRUCTIONS FOR CNAS ON DONNING/DOFFING PROSTHESIS:   1:  WHEN WEARING PROSTHESIS, DON SNUG (SMALLER) BLACK CUT OFF SOCK FIRST UNDER SILICONE LINER.  HAVE HER  SCOOT TO EDGE OF CHAIR.  THIS SHOULD BE DIRECTLY ON SKIN.  IF SHE IS HAVING BLEEDING OR IT GETS SOILED, SWITCH TO HER OTHER BLACK CUT OFF SOCK.  WHEN NOT WEARING PROSTHESIS, WEAR BLACK CUT OFF SOCK THAT IS LARGER WITH GRAY SHRINKER WITH RING.    2: INVERT LINER AND ENSURE THAT END IS AS FLAT AS POSSIBLE AND ENSURE PIN IS IN LINE WITH BONE.     3: WHEN DONNING WHITE SOCKS, HAVE HER TRY AND BUNDLE UP AND PLACE MIDDLE FINGERS IN HOLE SO THAT SHE KNOWS WHERE HOLE IS WHEN PUTTING ON.     3: PLACE PROSTHESIS ON FLOOR AND TIP BACK ON HEEL, HAVE HER PLACE LIMB INSIDE TO HEAR AT LEAST ONE CLICK PRIOR TO STANDING.  WORK ON HAVING HER PUSH HER KNEE DOWN INTO SOCKET.    4:  CAN PLACE SMALL DOT OF BABY/MINERAL OIL ON KNEE CAP AND RUB UPWARD TO PREVENT FRICTION RUBBING FROM LINER.    5: BEGIN WEARING PROSTHESIS 2 HOURS ON THEN DO 2 HOURS OFF, THEN 2 HOURS ON (CAN DO 2-3 TIMES A DAY LIKE THIS).  AFTER A WEEK, IF SKIN STILL LOOKS GOOD/HEALING, INCREASE TO 3 HOURS ON, 2 HOURS OFF, 3 HOURS ON.  (CAN DO 2-3 TIMES PER DAY AS WELL).          ASSESSMENT:   CLINICAL IMPRESSION: Session limited as pt late this visit.  She returns to PT recovered from recent episode of COVID and does not appear much physically changed in relation to prosthetic care, schedules, or skin integrity.  She continues to ambulate with flexed residual limb posture and requires further work on activity tolerance and general dynamic balance w/ LRAD.  Will continue POC.   OBJECTIVE IMPAIRMENTS Abnormal gait, decreased activity tolerance, decreased balance, decreased endurance, decreased knowledge of use of DME, decreased mobility, difficulty walking, decreased ROM, decreased strength, impaired perceived functional ability, impaired flexibility, postural dysfunction, and prosthetic dependency .    ACTIVITY LIMITATIONS carrying, lifting, bending, standing, squatting, stairs, transfers, bathing, toileting, dressing, reach over head,  hygiene/grooming, and locomotion level   PARTICIPATION LIMITATIONS: cleaning, laundry, driving, shopping, and community activity   PERSONAL FACTORS 3+ comorbidities: see above  are also affecting patient's functional outcome.    REHAB POTENTIAL: Good   CLINICAL DECISION MAKING: Evolving/moderate complexity   EVALUATION COMPLEXITY: Moderate     GOALS: Goals reviewed with patient? Yes   UPDATED SHORT TERM GOALS: Target date: 10/03/2021     Pt/caregiver will be IND with ongoing HEP in order to indicate improved functional mobility and dec fall risk. Baseline: Goal status: ONGOING    2.  Pt will be able to don/doff prosthesis consistently at S level and tolerate 6 hours of wear time per day without skin break down. Baseline: Intermittent min A  Goal status: REVISED   3.  Pt will tolerate standing x 15 mins with intermittent support at S level in order to indicate improved independence with ADLs.  Baseline:  Goal status: REVISED    4.  Will improve BERG to >/=31/56 in order to indicate decreased fall risk.   Baseline: (08/12/2022) 27/56 Goal status: REVISED    5.  Will improve gait speed to >/=  1.80 ft/sec with RW in order to indicate dec fall risk.  Baseline: 1.29 ft/sec (08/12/2022); 1.77 ft/sec (09/23/2022) Goal status: REVISED    6.  Pt will ambulate x 200' with RW and prosthesis at S in order to indicate improved household mobility.  Baseline: 200' CGA Goal status: REVISED    UPDATED LONG TERM GOALS: Target date: 11/03/2021     Pt will be IND with final HEP in order to indicate improved functional mobility and dec fall risk. Baseline:  Goal status: ONGOING    2.  Pt will ambulate x 150' indoors and 300' outdoors over unlevel paved surfaces with RW at S level in order to indicate improved household and community mobility.  Baseline:  Goal status: ONGOING    3.  Pt will negotiate up/down curb/ramp with RW and 4 steps with rails at S level in order to indicate improved  community access.   Baseline:  Goal status: ONGOING      5.  Pt will increase BERG balance score to >/=36/56 to demonstrate improved static balance.  Baseline: 27/56 (11/21) Goal status: REVISED    6.  Pt will demonstrate a gait speed of >/=2.4 feet/sec in order to decrease risk for falls. Baseline: 1.29 ft/sec w/ RW (11/21); 1.77 ft/sec (1/2) Goal status: REVISED    7.  Pt will don/doff prosthesis at S level and tolerate wearing prosthesis >/=90% of awake time in order to indicate more independent prosthetic care.   Baseline:  Goal status: ONGOING   8.   Will initiate gait training with quad tip cane when appropriate and will update goal.    Baseline:   Goal Status: New     PLAN: PT FREQUENCY: 2x/week   PT DURATION: 8 weeks   PLANNED INTERVENTIONS: Therapeutic exercises, Therapeutic activity, Neuromuscular re-education, Balance training, Gait training, Patient/Family education, Self Care, Joint mobilization, Stair training, Vestibular training, Prosthetic training, DME instructions, and Manual therapy   PLAN FOR NEXT SESSION: Did son reschedule [facility] appt?-what adjustments did they make (flexion of socket for heel touch w/ gait, trim posterior socket?), gait outdoors as able,  continue hip and hamstring stretching, seated scifit, Is she wearing 6 hours total per day?? How is hip stretch going? Continue to check skin.  Continue prosthetic education.  Prosthetic gait, balance, SLS tasks.  have her work on donning/doffing prosthesis.  Add to standing exercises.    Camille Bal, PT, DPT [facility] [address] [place], [place], [address] Phone: [phone]   Fax:  [phone] 09/30/22, 9:48 AM

## 2022-09-30 NOTE — Progress Notes (Signed)
Location:  [place] Room Number: 631 Place of Service:  ALF [phone]) Provider:  Windell Moulding, NP   Patient Care Team: [name], MD as PCP - General (Internal Medicine) San Morelle, MD as Referring Physician (Ophthalmology) Atilano Median, Gayleen Orem, MD as Referring Physician (Cardiology) Alyssa Grove, MD as Referring Physician (Cardiology)  Extended Emergency Contact Information Primary Emergency Contact: [name] Address: [address]          [place], [place] [address] [name] of [place] Phone: [phone] Mobile Phone: [phone] Relation: Son Preferred language: English Interpreter needed? No Secondary Emergency Contact: [name],[name] Mobile Phone: [phone] Relation: Relative  Code Status:  Full Code Goals of care: Advanced Directive information    09/30/2022   10:02 AM  Advanced Directives  Does Patient Have a Medical Advance Directive? Yes  Type of Paramedic of [place];Living will;Out of facility DNR (pink MOST or yellow form)  Does patient want to make changes to medical advance directive? No - Patient declined  Copy of [place] in Chart? Yes - validated most recent copy scanned in chart (See row information)     Chief Complaint  Patient presents with   Acute Visit    Skin Picking & Anxiety    HPI:  Pt is a 72 y.o. female seen today for acute visit due to skin picking and depression.   She currently resides on the assisted living unit at [facility] PMH: DVT, CAD, cardiomyopathy, PVD, T1DM, osteomyelitis, R BKA, anemia, depression, and unstable gait.    01/03 she was diagnosed with moderate depression. PHQ score 10. Treatment options discussed, she was in agreement to start Wellbutrin over therapy. Nursing reports she has been refusing Wellbutrin x 2 days. When asked about depression today, she replies " I said I was depressed, I did not agree to a pill." I  reminded her about our conversation last week including medication options versus therapy. Wellbutrin chosen due to increased sleepiness/ lack of motivation. She stated " I do not recall that conversation." She denies depression today. Plan to discontinue Wellbutrin per patient request.   Nursing reports possible skin picking behavior the past few days. Dry/open lesion to right stump healed but had increased erythema x 1 day. She refused to let me see it. She claims area has healed.    Past Medical History:  Diagnosis Date   Abnormal thyroid blood test    Per Records recevied from [facility], Dr.Pearson   Acute deep vein thrombosis of right peroneal vein ([place]) 10/05/2020   RLE DVT   Anxiety    Arthritis    RA   Coronary artery disease    Diabetes type I [facility])    Per [facility] New Patient Packet   Diabetic retinopathy ([place])    Per [facility] New Patient Packet   DKA (diabetic ketoacidosis) ([place]) 2021   Dr.William Zackowski.  Insulin Pump was out of Insulin   Glaucoma    Per [place] Patient Packet   H/O heart bypass surgery 2012   Dr. Luana Shu, Per [facility] New Patient Packet   Heart murmur    when I was a teenager   History of blood transfusion    History of kidney stones    passed   Hx of CABG    Per records received form [facility], Dr.Pearson    Left bundle branch block    Mild mitral regurgitation    Per records from [facility], Dr.Pearson  Neuropathy 1976   Per [facility] New Patient Packet   NSTEMI (non-ST elevated myocardial infarction) (HCC) 09/21/2020   NSTEMI/demand ischemic in setting of DKA, patent grafts by 09/24/20 [facility], continue medical therapy   Osteoporosis    T Score -2.7 , Per [facility] New Patient Packet   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 due to underlying condition [facility])    Per Records recevied from [facility], Dr.Pearson   PVD (peripheral vascular disease) (HCC) 10/27/2021   Type 1 diabetes mellitus with hyperglycemia (HCC) 10/28/2021    Past Surgical History:  Procedure Laterality Date   ABDOMINAL AORTOGRAM W/LOWER EXTREMITY N/A 10/02/2021   Procedure: ABDOMINAL AORTOGRAM W/LOWER EXTREMITY;  Surgeon: Victorino Sparrow, MD;  Location: [facility] INVASIVE CV LAB;  Service: Cardiovascular;  Laterality: N/A;   AMPUTATION Right 04/17/2021   Procedure: RIGHT GREAT TOE AMPUTATION;  Surgeon: [name], MD;  Location[facility] OR;  Service: Orthopedics;  Laterality: Right;   AMPUTATION Right 07/19/2021   Procedure: RIGHT FOOT 1ST RAY AMPUTATION;  Surgeon: [name], MD;  Location: [facility] OR;  Service: Orthopedics;  Laterality: Right;   AMPUTATION Right 02/06/2022   Procedure: AMPUTATION BELOW KNEE;  Surgeon: Toni Arthurs, MD;  Location: [facility] OR;  Service: Orthopedics;  Laterality: Right;  regional block   CESAREAN SECTION  1970   Dr. Aquilla Hacker, Per [facility] New Patient Packet   CESAREAN SECTION  1973   Dr. Aquilla Hacker, Per [facility] New Patient Packet   CORONARY ARTERY BYPASS GRAFT      Allergies  Allergen Reactions   Penicillins Anaphylaxis    Outpatient Encounter Medications as of 09/30/2022  Medication Sig   acetaminophen (TYLENOL) 500 MG tablet Take 1,000 mg by mouth 2 (two) times daily as needed for moderate pain.   atorvastatin (LIPITOR) 40 MG tablet Take 40 mg by mouth at bedtime.   Biotin w/ Vitamins C & E (HAIR/SKIN/NAILS PO) Take 1 tablet by mouth in the morning.   brimonidine (ALPHAGAN) 0.2 % ophthalmic solution Place 1 drop into the left eye 3 (three) times daily.   buPROPion (WELLBUTRIN XL) 150 MG 24 hr tablet Take 1 tablet (150 mg total) by mouth daily.   calcium carbonate (OSCAL) 1500 (600 Ca) MG TABS tablet Take 600 mg of elemental calcium by mouth daily.   clopidogrel (PLAVIX) 75 MG tablet Take 75 mg by mouth in the morning.   Continuous Blood Gluc Sensor (FREESTYLE LIBRE 14 DAY SENSOR) MISC Inject 1 sensor to the skin every 14 days for continuous glucose monitoring.   docusate sodium (COLACE) 100 MG capsule Take 100 mg by mouth as  needed for mild constipation.   glucosamine-chondroitin 500-400 MG tablet Take 1 tablet by mouth every morning.   Glucose 2 g CHEW Chew 1 tablet by mouth as needed. Give one tablet for blood sugar < 70, may repeat every 15 mins until blood glucose > 70, may give with juice and other foods.   insulin glargine (LANTUS SOLOSTAR) 100 UNIT/ML Solostar Pen Inject 12 Units into the skin daily. In the morning. PATIENT SELF ADMINISTERS   insulin glargine (LANTUS) 100 UNIT/ML injection Inject 3 Units into the skin daily. 9:00 pm-10:00 pm   insulin lispro (HUMALOG) 100 UNIT/ML KwikPen Inject 5-15 Units into the skin See admin instructions. If blood sugar is 151-200 = 5 units, 201-250 = 8 units, 251-300  = 10 units, 301-350 = 12 units, 351-400 = 14 units, 401 and above =  16.  If blood sugar  is greater than 200 inject 5 units at bedtime, if greater than 500 call on call provider   Insulin Pen Needle (PEN NEEDLES 3/16") 31G X 5 MM MISC 1 Device by Does not apply route 5 (five) times daily. With insulin administration   latanoprost (XALATAN) 0.005 % ophthalmic solution Place 1 drop into both eyes every evening.   lisinopril (ZESTRIL) 2.5 MG tablet Take 2.5 mg by mouth in the morning.   miconazole (MICOTIN) 200 MG vaginal suppository Place 200 mg vaginally at bedtime as needed (vaginal itching).   Multiple Vitamins-Minerals (I-VITE PO) Take 1 tablet by mouth in the morning.   nitroGLYCERIN (NITROSTAT) 0.4 MG SL tablet Place 0.4 mg under the tongue every 5 (five) minutes as needed for chest pain.   Omega-3 Fatty Acids (FISH OIL) 1000 MG CAPS Take 1,000 mg by mouth in the morning.   omeprazole (PRILOSEC) 20 MG capsule Take 20 mg by mouth daily.   sennosides-docusate sodium (SENOKOT-S) 8.6-50 MG tablet Take 1 tablet by mouth every other day. IN THE MORNING   timolol (TIMOPTIC) 0.5 % ophthalmic solution Place 1 drop into the left eye 2 (two) times daily.   No facility-administered encounter medications on file as of  09/30/2022.    Review of Systems  Constitutional:  Negative for activity change and appetite change.  HENT:  Negative for congestion and sore throat.   Eyes:  Negative for visual disturbance.  Respiratory:  Negative for cough, shortness of breath and wheezing.   Cardiovascular:  Negative for chest pain and leg swelling.  Gastrointestinal:  Negative for abdominal distention and abdominal pain.  Musculoskeletal:  Positive for gait problem.  Neurological:  Positive for weakness. Negative for dizziness and headaches.  Psychiatric/Behavioral:  Negative for confusion, dysphoric mood and sleep disturbance. The patient is not nervous/anxious.     Immunization History  Administered Date(s) Administered   Influenza, High Dose Seasonal PF 09/01/2005   PFIZER(Purple Top)SARS-COV-2 Vaccination 12/02/2019, 12/26/2019   Tdap 07/11/2014   Pertinent  Health Maintenance Due  Topic Date Due   FOOT EXAM  Never done   OPHTHALMOLOGY EXAM  Never done   COLONOSCOPY (Pts 45-70yrs Insurance coverage will need to be confirmed)  Never done   MAMMOGRAM  Never done   INFLUENZA VACCINE  04/22/2022   HEMOGLOBIN A1C  07/27/2022   DEXA SCAN  Completed      02/07/2022   11:00 AM 04/21/2022    2:53 PM 07/29/2022    9:15 AM 08/12/2022   11:48 AM 09/30/2022   10:01 AM  Fall Risk  Falls in the past year?  0 1 1 0  Was there an injury with Fall?  0 1 1 0  Fall Risk Category Calculator  0 3 3 0  Fall Risk Category  Low High High Low  Patient Fall Risk Level High fall risk Low fall risk High fall risk High fall risk High fall risk  Patient at Risk for Falls Due to  No Fall Risks History of fall(s);Impaired balance/gait;Impaired mobility;Impaired vision History of fall(s);Impaired balance/gait;Impaired mobility;Impaired vision History of fall(s);Impaired balance/gait;Impaired vision;Impaired mobility  Fall risk Follow up  Falls evaluation completed Falls evaluation completed Falls evaluation completed Falls evaluation  completed   Functional Status Survey:    Vitals:   09/30/22 0954  BP: (!) 178/80  Pulse: 73  Resp: 18  Temp: (!) 97.5 F (36.4 C)  SpO2: 91%  Weight: 124 lb 8 oz (56.5 kg)  Height: 5\' 7"  (1.702 m)   Body mass  index is 19.5 kg/m. Physical Exam Vitals reviewed.  Constitutional:      General: She is not in acute distress. HENT:     Head: Normocephalic.  Eyes:     General:        Right eye: No discharge.        Left eye: No discharge.  Cardiovascular:     Rate and Rhythm: Normal rate and regular rhythm.     Pulses: Normal pulses.     Heart sounds: Normal heart sounds.  Pulmonary:     Effort: Pulmonary effort is normal.     Breath sounds: Normal breath sounds.  Abdominal:     General: Bowel sounds are normal. There is no distension.     Palpations: Abdomen is soft.     Tenderness: There is no abdominal tenderness.  Musculoskeletal:     Cervical back: Neck supple.     Left lower leg: No edema.     Comments: Right BKA  Skin:    General: Skin is warm and dry.     Capillary Refill: Capillary refill takes less than 2 seconds.     Findings: Erythema present.     Comments: UTA, patient refused  Neurological:     General: No focal deficit present.     Mental Status: She is alert and oriented to person, place, and time.     Motor: Weakness present.     Gait: Gait abnormal.     Comments: wheelchair  Psychiatric:        Mood and Affect: Mood normal.        Behavior: Behavior normal.     Labs reviewed: Recent Labs    10/28/21 1039 12/18/21 0000 02/06/22 0612 02/07/22 0222 02/11/22 0000 04/22/22 0000 07/17/22 0000 08/09/22 1226 08/23/22 0000  NA 138   < > 137 137   < > 136* 143 133* 139  K 4.2   < > 4.3 4.4   < > 4.7 4.7 4.5 4.3  CL 100   < > 101 104   < > 98* 106 97* 104  CO2 32   < > 27 27   < > 24* 29* 24* 24*  GLUCOSE 249*  --  277* 248*  --   --   --   --   --   BUN 24   < > 24* 22   < > 34* 25* 25* 42*  CREATININE 0.99   < > 0.79 0.86   < > 0.9 1.0   --  0.9  CALCIUM 9.8   < > 9.4 8.5*   < > 9.6 9.2 9.3 9.5   < > = values in this interval not displayed.   Recent Labs    10/28/21 1039 10/28/21 1039 12/18/21 0000 04/22/22 0000 07/17/22 0000 08/23/22 0000  AST 19  --  14 18 18 19   ALT 17  --  9 15 13 18   ALKPHOS  --    < > 139* 118 65 74  BILITOT 0.4  --   --   --   --   --   PROT 6.9  --   --   --   --   --   ALBUMIN  --   --  3.2*  --  3.7 3.6   < > = values in this interval not displayed.   Recent Labs    10/28/21 1039 12/18/21 0000 02/06/22 0612 02/07/22 0222 02/11/22 0000 03/17/22 0000 04/22/22 0000 07/17/22 0000  WBC  6.5   < > 7.7 7.6   < > 6.4 5.5 5.2  NEUTROABS 3,562  --   --   --   --   --  3.20  --   HGB 10.4*   < > 10.2* 8.5*   < > 10.3* 11.2* 10.7*  HCT 32.3*   < > 31.7* 26.6*   < > 31* 33* 32*  MCV 88.7  --  88.5 87.5  --   --   --   --   PLT 286   < > 367 325   < > 288 262 266   < > = values in this interval not displayed.   Lab Results  Component Value Date   TSH 3.78 04/22/2022   Lab Results  Component Value Date   HGBA1C 12.7 01/24/2022   Lab Results  Component Value Date   CHOL 173 04/22/2022   HDL 81 (A) 04/22/2022   LDLCALC 76 04/22/2022   TRIG 77 04/22/2022    Significant Diagnostic Results in last 30 days:  No results found.  Assessment/Plan 1. Moderate episode of recurrent major depressive disorder (HCC) - 01/03 diagnosed with moderate depression> increased sleeping/ loss of interest/ crying x 1 month per patient - PHQ score 10 - denies depression today, did not recall out conversation last week including treatment options - discontinue Wellbutrin   2. Abrasion of right lower extremity, sequela - UTA, patient refused - nursing questioned skin picking behaviors?     Family/ staff Communication: plan discussed with patient and nurse  Labs/tests ordered:  none

## 2022-10-01 ENCOUNTER — Telehealth: Payer: Self-pay | Admitting: Neurology

## 2022-10-01 NOTE — Telephone Encounter (Signed)
[employer] medicare no auth req  VM full- No msg [place] 09/30/21

## 2022-10-02 ENCOUNTER — Ambulatory Visit: Payer: Medicare Other | Admitting: Physical Therapy

## 2022-10-02 ENCOUNTER — Encounter: Payer: Self-pay | Admitting: Physical Therapy

## 2022-10-02 DIAGNOSIS — R293 Abnormal posture: Secondary | ICD-10-CM

## 2022-10-02 DIAGNOSIS — R2681 Unsteadiness on feet: Secondary | ICD-10-CM | POA: Diagnosis not present

## 2022-10-02 DIAGNOSIS — R2689 Other abnormalities of gait and mobility: Secondary | ICD-10-CM

## 2022-10-02 DIAGNOSIS — M6281 Muscle weakness (generalized): Secondary | ICD-10-CM

## 2022-10-02 NOTE — Therapy (Signed)
OUTPATIENT PHYSICAL THERAPY TREATMENT NOTE   Patient Name: Caitlin Sharp MRN: [id number] DOB:[birth year], 72 y.o., female Today's Date: 10/02/2022  PCP: Einar Crow, MD REFERRING PROVIDER: Alfredo Martinez, PA-C  END OF SESSION:   PT End of Session - 10/02/22 0945     Visit Number 18    Number of Visits 30   per updated pOC   Date for PT Re-Evaluation 11/03/22   per updated POC   Authorization Type [employer] Medicare (10th visit PN needed)    PT Start Time 0940   pt late   PT Stop Time 1025    PT Time Calculation (min) 45 min    Equipment Utilized During Treatment Gait belt    Activity Tolerance Patient tolerated treatment well    Behavior During Therapy WFL for tasks assessed/performed               Past Medical History:  Diagnosis Date   Abnormal thyroid blood test    Per Records recevied from [facility], Dr.Pearson   Acute deep vein thrombosis of right peroneal vein (HCC) 10/05/2020   RLE DVT   Anxiety    Arthritis    RA   Coronary artery disease    Diabetes type I (HCC)    Per [facility] New Patient Packet   Diabetic retinopathy (HCC)    Per [facility] New Patient Packet   DKA (diabetic ketoacidosis) (HCC) 2021   Dr.William Zackowski.  Insulin Pump was out of Insulin   Glaucoma    Per [facility] New Patient Packet   H/O heart bypass surgery 2012   Dr. Excell Seltzer, Per [facility] New Patient Packet   Heart murmur    when I was a teenager   History of blood transfusion    History of kidney stones    passed   Hx of CABG    Per records received form [facility], Dr.Pearson    Left bundle branch block    Mild mitral regurgitation    Per records from [facility], Dr.Pearson    Neuropathy 1976   Per [facility] New Patient Packet   NSTEMI (non-ST elevated myocardial infarction) (HCC) 09/21/2020   NSTEMI/demand ischemic in setting of DKA, patent grafts by 09/24/20 [facility], continue medical therapy   Osteoporosis    T Score -2.7 , Per [facility] New Patient Packet   Proliferative diabetic retinopathy of both eyes  associated with diabetes mellitus due to underlying condition [facility])    Per Records recevied from [facility], Dr.Pearson   PVD (peripheral vascular disease) (HCC) 10/27/2021   Type 1 diabetes mellitus with hyperglycemia (HCC) 10/28/2021   Past Surgical History:  Procedure Laterality Date   ABDOMINAL AORTOGRAM W/LOWER EXTREMITY N/A 10/02/2021   Procedure: ABDOMINAL AORTOGRAM W/LOWER EXTREMITY;  Surgeon: Victorino Sparrow, MD;  Location: [facility] INVASIVE CV LAB;  Service: Cardiovascular;  Laterality: N/A;   AMPUTATION Right 04/17/2021   Procedure: RIGHT GREAT TOE AMPUTATION;  Surgeon: [name], MD;  Location: [facility] OR;  Service: Orthopedics;  Laterality: Right;   AMPUTATION Right 07/19/2021   Procedure: RIGHT FOOT 1ST RAY AMPUTATION;  Surgeon: [name], MD;  Location: [facility] OR;  Service: Orthopedics;  Laterality: Right;   AMPUTATION Right 02/06/2022   Procedure: AMPUTATION BELOW KNEE;  Surgeon: Toni Arthurs, MD;  Location: [facility] OR;  Service: Orthopedics;  Laterality: Right;  regional block   CESAREAN SECTION  1970   Dr. Aquilla Hacker, Per [facility] New Patient Packet   CESAREAN SECTION  1973   Dr. 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 ([place]) 07/29/2022   Ischemic cardiomyopathy 07/29/2022   Moderate episode of recurrent major depressive disorder ([place]) 07/29/2022   Anemia of chronic disease 07/29/2022   S/P BKA (below knee amputation) unilateral, right ([place]) 07/29/2022   Daytime sleepiness 07/29/2022   Cognitive impairment 07/29/2022   Osteomyelitis ([place]) 02/06/2022   Osteomyelitis of right foot ([place]) 02/06/2022   Type 1 diabetes mellitus with hyperglycemia ([place]) 10/28/2021   PVD (peripheral vascular disease) ([place]) 10/27/2021   Osteomyelitis of great toe of right foot (HCC)    NSVT (nonsustained ventricular tachycardia) ([place]) 01/17/2021   Acute deep vein thrombosis  (DVT) of right peroneal vein ([place]) 10/17/2020   Insulin long-term use ([place]) 07/18/2020   Bruit of left carotid artery 10/31/2019   Coronary artery disease involving native coronary artery of native heart without angina pectoris 10/31/2019   Open-angle glaucoma of right eye 10/31/2019   Dry eyes 03/01/2018   Presence of intraocular lens 10/18/2015   Cystoid macular edema 06/14/2012    REFERRING DIAG: Z89.511 (ICD-10-CM) - Acquired absence of right leg below knee Z47.89 (ICD-10-CM) - Encounter for prosthetic gait training    THERAPY DIAG:  Unsteadiness on feet  Abnormal posture  Other abnormalities of gait and mobility  Muscle weakness (generalized)  Rationale for Evaluation and Treatment Rehabilitation  PERTINENT HISTORY: Hx of BKA to the right leg due to osteomyelitis 02/06/22. Worked with therapy and then transferred to assisted living. She has been a Type 1 DM for over 50 years. Used to be on a insulin pump now on lantus and meal coverage.  Recently saw endocrinology and they lowered her lantus as she voiced concern for hypoglycemia. A1C 8.6 on 04/22/22 which is improved  She keeps getting skin tears from hitting objects such as her WC She is awaiting a prosthesis from [facility].  Was treated for thigh cellulitis due to an infected skin tear with Doxycycline on 04/29/22.  She reports the area is much improved. Now has a skin tear to the LLE that has some mild yellow drainage and redness per nursing staff that is concerning.   PRECAUTIONS: Fall  SUBJECTIVE:                                                                                                                                                                                      SUBJECTIVE STATEMENT:   Pt returns with care aide Candace.  Pt states son called and confirmed with [facility] the appt notes discussed last visit.  Pt states she has only been wearing the prosthetic 1 hour a day.  PAIN:  Are you having pain? No   OBJECTIVE:  (objective measures completed at initial evaluation unless otherwise dated)  TODAY'S TREATMENT:  CURRENT PROSTHETIC WEAR ASSESSMENT: Patient is independent with:  She is now able to assist some with doffing leg, donning socks;  Patient is dependent with: skin check, residual limb care, prosthetic cleaning, ply sock cleaning, correct ply sock adjustment, proper wear schedule/adjustment, proper weight-bearing schedule/adjustment Donning prosthesis: min A (to assist with aligning pin) Doffing prosthesis: S Prosthetic wear tolerance:  Pt was more consistently wearing 6 hours per day, but since wound developed she has not put the leg on much the past 4 days as it has been tender.  Recommended continued time without prosthetic (wear once 1 hour in morning and once 1 hour in evening w/ skin checks). Prosthetic weight bearing tolerance: 30 minutes (prior to wound);  recommended 5 minutes at a time until wound is completely healed Edema: none noted Residual limb condition: Scabbed area on distal-medial incision of residual limb appearing to be non-draining, healing well, wearing vivewear.  Small pink flaky area on most distal part of residual limb posterior to incision line. Prosthetic description: Pin lock suspension K code/activity level with prosthetic use: Level 3  Pt presents today with black cut vivewear under liner.  PT doffs Vivewear to assess skin integrity.  PT dons Vivewear, liner, socks, and prosthetic to assess fit.  Able to don 4 ply as this is all patient brought to session today (3 + 1).    GAIT: Gait pattern: step to pattern, step through pattern, decreased stride length, lateral hip instability, trunk flexed, and narrow BOS Distance walked: 200' Assistive device utilized: Environmental consultant - 2 wheeled Level of assistance: SBA Comments: Pt denies pain or excess pressure on residual limb even in area of wound with prolonged ambulation.  Continues to have flexed right LE with intermittent toe drag.   Trialed postural changes and cues for RLE muscle activation to improve weight bearing through the heel of prosthesis with little change noted with effort. -SLS at counter 3x30 sec RLE only -Corner wide semi-tandem alt LE in rear 2x30 sec progressing to no UE support -Step ups w/ LLE and 6" step taps w/ RLE (attempted step up w/ RLE x1) using BUE support progressed to LUE support only x15 each LE -Standing on RLE w/ BUE support toe taps to 3rd step w/ return taps, cued for upright posture PATIENT EDUCATED ON FOLLOWING PROSTHETIC CARE: Prosthetic wear tolerance: 4 hours/day, 7 days/week; 10/02/2022 PT recommended wearing prosthetic 2-3 hours per day at most w/ skin checks after wearing or weight bearing. Prosthetic weight bearing tolerance: 5 mins at a time Other education  Skin check, Residual limb care, Care of non-amputated limb, Prosthetic cleaning, Ply sock cleaning, Correct ply sock adjustment, Propper donning, Proper doffing, Proper wear schedule/adjustment, and Proper weight-bearing schedule/adjustment    PATIENT EDUCATION:  Continue with HEP (especially stretching hamstrings and hip flexors to improve quality of gait) and adjustments to reduced wear and weight bearing schedules.  Clarified that PT has contacted [facility] to ensure they address heel contact w/ ground, trimming socket, and possibly padding the inside portion of the socket to prevent rubbing where the wound developed.  Discussed w/ staff member present today skin checks, asking pt about excess pressure/rubbing/pain during walking on RLE, and assessment of redness if any to ensure it does not develop into wound.  Explained Vivewear in direct  contact with wound bed and emphasized importance of clean Vivewear everyday for wound healing to care aide.  Please bring all sock options to future visits.  HOME EXERCISE PROGRAM:  Access Code: [email or web address] URL: [email or web address] Date: 09/02/2022 Prepared by: Harriet Butte  Exercises - Side Stepping with Counter Support  - 1 x daily - 7 x weekly - 2 sets - 10 reps - Standing Hip Abduction with Counter Support  - 1 x daily - 7 x weekly - 2 sets - 10 reps - Standing Thoracic and Cervical Rotation with Reach at Wall  - 1 x daily - 7 x weekly - 2 sets - 10 reps - Supine Hip Flexor Stretch with Weight  - 1 x daily - 7 x weekly - 1 sets - 2-3 reps - 30-60 secs hold - Seated Table Hamstring Stretch  - 1 x daily - 7 x weekly - 1 sets - 3 reps - 30 hold  Performed bolded exercises 12/12  INSTRUCTIONS FOR CNAS ON DONNING/DOFFING PROSTHESIS:   1:  WHEN WEARING PROSTHESIS, DON SNUG (SMALLER) BLACK CUT OFF SOCK FIRST UNDER SILICONE LINER.  HAVE HER SCOOT TO EDGE OF CHAIR.  THIS SHOULD BE DIRECTLY ON SKIN.  IF SHE IS HAVING BLEEDING OR IT GETS SOILED, SWITCH TO HER OTHER BLACK CUT OFF SOCK.  WHEN NOT WEARING PROSTHESIS, WEAR BLACK CUT OFF SOCK THAT IS LARGER WITH GRAY SHRINKER WITH RING.    2: INVERT LINER AND ENSURE THAT END IS AS FLAT AS POSSIBLE AND ENSURE PIN IS IN LINE WITH BONE.     3: WHEN DONNING WHITE SOCKS, HAVE HER TRY AND BUNDLE UP AND PLACE MIDDLE FINGERS IN HOLE SO THAT SHE KNOWS WHERE HOLE IS WHEN PUTTING ON.     3: PLACE PROSTHESIS ON FLOOR AND TIP BACK ON HEEL, HAVE HER PLACE LIMB INSIDE TO HEAR AT LEAST ONE CLICK PRIOR TO STANDING.  WORK ON HAVING HER PUSH HER KNEE DOWN INTO SOCKET.    4:  CAN PLACE SMALL DOT OF BABY/MINERAL OIL ON KNEE CAP AND RUB UPWARD TO PREVENT FRICTION RUBBING FROM LINER.    5: BEGIN WEARING PROSTHESIS 2 HOURS ON THEN DO 2 HOURS OFF, THEN 2 HOURS ON (CAN DO 2-3 TIMES A DAY LIKE THIS).  AFTER A WEEK, IF SKIN STILL LOOKS GOOD/HEALING, INCREASE TO 3 HOURS ON, 2 HOURS OFF, 3 HOURS ON.  (CAN DO 2-3 TIMES PER DAY AS WELL).    ASSESSMENT:   CLINICAL IMPRESSION: Continued to work on gait training and prosthetic management this session.  Pt did not bring all sock options so only able to pad residual limb w/ 4 ply when pt would have  benefited from additional socks today.  Progressed to balance challenges allowing weight through RLE which anticipating posture to change as socket is adjusted tomorrow.  Will continue with ongoing POC.   OBJECTIVE IMPAIRMENTS Abnormal gait, decreased activity tolerance, decreased balance, decreased endurance, decreased knowledge of use of DME, decreased mobility, difficulty walking, decreased ROM, decreased strength, impaired perceived functional ability, impaired flexibility, postural dysfunction, and prosthetic dependency .    ACTIVITY LIMITATIONS carrying, lifting, bending, standing, squatting, stairs, transfers, bathing, toileting, dressing, reach over head, hygiene/grooming, and locomotion level   PARTICIPATION LIMITATIONS: cleaning, laundry, driving, shopping, and community activity   PERSONAL FACTORS 3+ comorbidities: see above  are also affecting patient's functional outcome.    REHAB POTENTIAL: Good   CLINICAL DECISION MAKING: Evolving/moderate complexity   EVALUATION COMPLEXITY: Moderate     GOALS: Goals reviewed  with patient? Yes   UPDATED SHORT TERM GOALS: Target date: 10/03/2021     Pt/caregiver will be IND with ongoing HEP in order to indicate improved functional mobility and dec fall risk. Baseline: Goal status: ONGOING    2.  Pt will be able to don/doff prosthesis consistently at S level and tolerate 6 hours of wear time per day without skin break down. Baseline: Intermittent min A  Goal status: REVISED   3.  Pt will tolerate standing x 15 mins with intermittent support at S level in order to indicate improved independence with ADLs.  Baseline:  Goal status: REVISED    4.  Will improve BERG to >/=31/56 in order to indicate decreased fall risk.   Baseline: (08/12/2022) 27/56 Goal status: REVISED    5.  Will improve gait speed to >/=1.80 ft/sec with RW in order to indicate dec fall risk.  Baseline: 1.29 ft/sec (08/12/2022); 1.77 ft/sec (09/23/2022) Goal status:  REVISED    6.  Pt will ambulate x 200' with RW and prosthesis at S in order to indicate improved household mobility.  Baseline: 200' CGA Goal status: REVISED    UPDATED LONG TERM GOALS: Target date: 11/03/2021     Pt will be IND with final HEP in order to indicate improved functional mobility and dec fall risk. Baseline:  Goal status: ONGOING    2.  Pt will ambulate x 150' indoors and 300' outdoors over unlevel paved surfaces with RW at S level in order to indicate improved household and community mobility.  Baseline:  Goal status: ONGOING    3.  Pt will negotiate up/down curb/ramp with RW and 4 steps with rails at S level in order to indicate improved community access.   Baseline:  Goal status: ONGOING      5.  Pt will increase BERG balance score to >/=36/56 to demonstrate improved static balance.  Baseline: 27/56 (11/21) Goal status: REVISED    6.  Pt will demonstrate a gait speed of >/=2.4 feet/sec in order to decrease risk for falls. Baseline: 1.29 ft/sec w/ RW (11/21); 1.77 ft/sec (1/2) Goal status: REVISED    7.  Pt will don/doff prosthesis at S level and tolerate wearing prosthesis >/=90% of awake time in order to indicate more independent prosthetic care.   Baseline:  Goal status: ONGOING   8.   Will initiate gait training with quad tip cane when appropriate and will update goal.    Baseline:   Goal Status: New     PLAN: PT FREQUENCY: 2x/week   PT DURATION: 8 weeks   PLANNED INTERVENTIONS: Therapeutic exercises, Therapeutic activity, Neuromuscular re-education, Balance training, Gait training, Patient/Family education, Self Care, Joint mobilization, Stair training, Vestibular training, Prosthetic training, DME instructions, and Manual therapy   PLAN FOR NEXT SESSION: [facility] appt 10/03/2022 (flexion of socket for heel touch w/ gait, trim posterior socket?, padding where wound occurred?), Is wound healed? continue hip and hamstring stretching, seated scifit,  How is  hip stretch going? Continue to check skin.  Continue prosthetic education.  Prosthetic gait, balance, SLS tasks.  have her work on donning/doffing prosthesis.  Add to standing exercises.    Camille Bal, PT, DPT [facility] [address] [place], [place], [address] Phone: [phone]   Fax:  [phone] 10/02/22, 11:02 AM

## 2022-10-07 ENCOUNTER — Ambulatory Visit: Payer: Medicare Other | Admitting: Physical Therapy

## 2022-10-07 NOTE — Telephone Encounter (Signed)
Unable to lvm for patient to schedule sleep study. Will wait for patient to reach out to [place] and will schedule sleep study at that time.   We have attempted to call the patient two times to schedule sleep study.  Patient has been unavailable at the phone numbers we have on file and has not returned our calls.  If patient calls back we will schedule them for their sleep study.

## 2022-10-09 ENCOUNTER — Ambulatory Visit: Payer: Medicare Other | Admitting: Rehabilitation

## 2022-10-09 ENCOUNTER — Encounter: Payer: Self-pay | Admitting: Rehabilitation

## 2022-10-09 DIAGNOSIS — M6281 Muscle weakness (generalized): Secondary | ICD-10-CM

## 2022-10-09 DIAGNOSIS — R2689 Other abnormalities of gait and mobility: Secondary | ICD-10-CM

## 2022-10-09 DIAGNOSIS — R293 Abnormal posture: Secondary | ICD-10-CM

## 2022-10-09 DIAGNOSIS — R2681 Unsteadiness on feet: Secondary | ICD-10-CM | POA: Diagnosis not present

## 2022-10-09 NOTE — Therapy (Signed)
OUTPATIENT PHYSICAL THERAPY TREATMENT NOTE   Patient Name: Caitlin Sharp MRN: [id number] DOB:Feb 07, 1951, 72 y.o., female Today's Date: 10/09/2022  PCP: Veleta Miners, MD REFERRING PROVIDER: Mechele Claude, PA-C  END OF SESSION:   PT End of Session - 10/09/22 1004     Visit Number 19    Number of Visits 30   per updated [place]   Date for PT Re-Evaluation 11/03/22   per updated POC   Authorization Type [employer] Medicare (10th visit PN needed)    PT Start Time 1002    PT Stop Time 1047    PT Time Calculation (min) 45 min    Equipment Utilized During Treatment Gait belt    Activity Tolerance Patient tolerated treatment well    Behavior During Therapy WFL for tasks assessed/performed               Past Medical History:  Diagnosis Date   Abnormal thyroid blood test    Per Records recevied from [facility], Dr.Pearson   Acute deep vein thrombosis of right peroneal vein ([place]) 10/05/2020   RLE DVT   Anxiety    Arthritis    RA   Coronary artery disease    Diabetes type I ([place])    Per [facility] New Patient Packet   Diabetic retinopathy ([place])    Per [place] New Patient Packet   DKA (diabetic ketoacidosis) ([place]) 2021   Dr.William Zackowski.  Insulin Pump was out of Insulin   Glaucoma    Per [place] Patient Packet   H/O heart bypass surgery 2012   Dr. Luana Shu, Per [facility] New Patient Packet   Heart murmur    when I was a teenager   History of blood transfusion    History of kidney stones    passed   Hx of CABG    Per records received form [facility], Dr.Pearson    Left bundle branch block    Mild mitral regurgitation    Per records from [facility], [place]    Neuropathy 1976   Per [place] Patient Packet   NSTEMI (non-ST elevated myocardial infarction) ([place]) 09/21/2020   NSTEMI/demand ischemic in setting of DKA, patent grafts by 09/24/20 [facility], continue medical therapy   Osteoporosis    T Score -2.7 , Per [place] New Patient Packet   Proliferative diabetic retinopathy of both eyes  associated with diabetes mellitus due to underlying condition [facility])    Per Records recevied from [facility], Dr.Pearson   PVD (peripheral vascular disease) ([place]) 10/27/2021   Type 1 diabetes mellitus with hyperglycemia ([place]) 10/28/2021   Past Surgical History:  Procedure Laterality Date   ABDOMINAL AORTOGRAM W/LOWER EXTREMITY N/A 10/02/2021   Procedure: ABDOMINAL AORTOGRAM W/LOWER EXTREMITY;  Surgeon: Broadus John, MD;  Location: [place] [facility];  Service: Cardiovascular;  Laterality: N/A;   AMPUTATION Right 04/17/2021   Procedure: RIGHT GREAT TOE AMPUTATION;  Surgeon: Newt Minion, MD;  Location: [place];  Service: Orthopedics;  Laterality: Right;   AMPUTATION Right 07/19/2021   Procedure: RIGHT FOOT 1ST RAY AMPUTATION;  Surgeon: Newt Minion, MD;  Location: [place];  Service: Orthopedics;  Laterality: Right;   AMPUTATION Right 02/06/2022   Procedure: AMPUTATION BELOW KNEE;  Surgeon: Wylene Simmer, MD;  Location: [place];  Service: Orthopedics;  Laterality: Right;  regional block   CESAREAN SECTION  1970   Dr. Moses Manners, Per [facility] New Patient Packet   CESAREAN SECTION  1973   Dr. 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 ([place]) 07/29/2022   Ischemic cardiomyopathy 07/29/2022   Moderate episode of recurrent major depressive disorder ([place]) 07/29/2022   Anemia of chronic disease 07/29/2022   S/P BKA (below knee amputation) unilateral, right ([place]) 07/29/2022   Daytime sleepiness 07/29/2022   Cognitive impairment 07/29/2022   Osteomyelitis ([place]) 02/06/2022   Osteomyelitis of right foot ([place]) 02/06/2022   Type 1 diabetes mellitus with hyperglycemia ([place]) 10/28/2021   PVD (peripheral vascular disease) ([place]) 10/27/2021   Osteomyelitis of great toe of right foot ([facility])    NSVT (nonsustained ventricular tachycardia) ([place]) 01/17/2021   Acute deep vein thrombosis  (DVT) of right peroneal vein ([place]) 10/17/2020   Insulin long-term use ([place]) 07/18/2020   Bruit of left carotid artery 10/31/2019   Coronary artery disease involving native coronary artery of native heart without angina pectoris 10/31/2019   Open-angle glaucoma of right eye 10/31/2019   Dry eyes 03/01/2018   Presence of intraocular lens 10/18/2015   Cystoid macular edema 06/14/2012    REFERRING DIAG: Z89.511 (ICD-10-CM) - Acquired absence of right leg below knee Z47.89 (ICD-10-CM) - Encounter for prosthetic gait training    THERAPY DIAG:  Unsteadiness on feet  Abnormal posture  Other abnormalities of gait and mobility  Muscle weakness (generalized)  Rationale for Evaluation and Treatment Rehabilitation  PERTINENT HISTORY: Hx of BKA to the right leg due to osteomyelitis 02/06/22. Worked with therapy and then transferred to assisted living. She has been a Type 1 DM for over 50 years. Used to be on a insulin pump now on lantus and meal coverage.  Recently saw endocrinology and they lowered her lantus as she voiced concern for hypoglycemia. A1C 8.6 on 04/22/22 which is improved  She keeps getting skin tears from hitting objects such as her WC She is awaiting a prosthesis from [facility].  Was treated for thigh cellulitis due to an infected skin tear with Doxycycline on 04/29/22.  She reports the area is much improved. Now has a skin tear to the LLE that has some mild yellow drainage and redness per nursing staff that is concerning.   PRECAUTIONS: Fall  SUBJECTIVE:                                                                                                                                                                                      SUBJECTIVE STATEMENT:   Pt arrived late today, stating "I just get here when they say get here."  Aide reports that her ride doesn't get there until 62  and that isn't early enough for a 930 apt time.  PT encouraged them to speak with facility about ride coming  earlier vs changing all appts as they are all at 930 time.    PAIN:  Are you having pain? No   OBJECTIVE: (objective measures completed at initial evaluation unless otherwise dated)  TODAY'S TREATMENT:  CURRENT PROSTHETIC WEAR ASSESSMENT: Patient is independent with:  She is now able to assist some with doffing leg, donning socks;  Patient is dependent with: skin check, residual limb care, prosthetic cleaning, ply sock cleaning, correct ply sock adjustment, proper wear schedule/adjustment, proper weight-bearing schedule/adjustment Donning prosthesis: min A (to assist with aligning pin) Doffing prosthesis: S Prosthetic wear tolerance:  Pt reports continued inconsistent wear time however skin is healed.  So encouraged her to go back to 2-3 hours, 2x/day with at least an hour in between.  Prosthetic weight bearing tolerance: Due to skin healed, encouraged her to go back to 10-15 mins at most, building up to 30 mins as skin allows.  Edema: Limb was slightly swollen today and she reports that she did not wear her shrinker at all times when not wearing leg.   Residual limb condition: Previous wound healed, small new area on upper thigh likely from friction of liner so added small dot of baby oil to area.   Prosthetic description: Pin lock suspension K code/activity level with prosthetic use: Level 3  Pt presents today with leg donned with black vivewear AND white 1 ply sock under liner.  Educated heavily how this is safety concern as leg could fall off if this continued at her facility.  She reports it was a different person but she knew that wasn't right.  PT educated that she needs to advocate for herself if she knows something isn't right or doesn't feel right.  PT took pictures of order of donning vivewear, liner, socks and prosthesis in session. PT to call and speak with son about printing these from her phone and posting with numbers in facility for improved carryover.    GAIT: Gait pattern:  step to pattern, step through pattern, decreased stride length, lateral hip instability, trunk flexed, and narrow BOS Distance walked: 115' Assistive device utilized: Environmental consultant - 2 wheeled Level of assistance: SBA Comments: Pt reports she did go to [facility] and they made adjustments.  She was not able to tell me exactly what they did.  PT did note small pretibial padding added and it seemed that heel was about to get more towards ground today despite her knee still being very flexed during all aspects of gait.  Continued cues for trying to improve knee extension during gait and for more upright posture throughout.    [facility] PT Assessment - 10/09/22 1032       Berg Balance Test   Sit to Stand Able to stand  independently using hands    Standing Unsupported Able to stand 30 seconds unsupported    Sitting with Back Unsupported but Feet Supported on Floor or Stool Able to sit safely and securely 2 minutes    Stand to Sit Controls descent by using hands    Transfers Needs one person to assist                PATIENT EDUCATED ON FOLLOWING PROSTHETIC CARE: Prosthetic wear tolerance: 4 hours/day, 7 days/week; 10/09/2022 PT recommended wearing prosthetic 2-3 hours per day, 2x/day still checking skin Prosthetic weight bearing tolerance: 10-15 mins at a time Other education  Skin  check, Residual limb care, Care of non-amputated limb, Prosthetic cleaning, Ply sock cleaning, Correct ply sock adjustment, [place]pper donning, Proper doffing, Proper wear schedule/adjustment, and Proper weight-bearing schedule/adjustment    PATIENT EDUCATION:  Continue with HEP (especially stretching hamstrings and hip flexors to improve quality of gait).  Continue to educate on going back up in wear time as long as skin integrity is intact.  Continue to educate on proper donning of prosthesis and importance of having vivewear on first then liner then socks for safety.  PT to call and notify son of pictures PT took today to help  with caregiver carryover.    HOME EXERCISE PROGRAM:  Access Code: [email or web address] URL: [email or web address] Date: 09/02/2022 Prepared by: Cameron Sprang  Exercises - Side Stepping with Counter Support  - 1 x daily - 7 x weekly - 2 sets - 10 reps - Standing Hip Abduction with Counter Support  - 1 x daily - 7 x weekly - 2 sets - 10 reps - Standing Thoracic and Cervical Rotation with Reach at [place]  - 1 x daily - 7 x weekly - 2 sets - 10 reps - Supine Hip Flexor Stretch with Weight  - 1 x daily - 7 x weekly - 1 sets - 2-3 reps - 30-60 secs hold - Seated Table Hamstring Stretch  - 1 x daily - 7 x weekly - 1 sets - 3 reps - 30 hold  Performed bolded exercises 12/12  INSTRUCTIONS FOR CNAS ON DONNING/DOFFING PROSTHESIS:   1:  WHEN WEARING PROSTHESIS, DON SNUG (SMALLER) BLACK CUT OFF SOCK FIRST UNDER SILICONE LINER.  HAVE HER SCOOT TO EDGE OF CHAIR.  THIS SHOULD BE DIRECTLY ON SKIN.  IF SHE IS HAVING BLEEDING OR IT GETS SOILED, SWITCH TO HER OTHER BLACK CUT OFF SOCK.  WHEN NOT WEARING PROSTHESIS, WEAR BLACK CUT OFF SOCK THAT IS LARGER WITH GRAY SHRINKER WITH RING.    2: INVERT LINER AND ENSURE THAT END IS AS FLAT AS POSSIBLE AND ENSURE PIN IS IN LINE WITH BONE.     3: WHEN DONNING WHITE SOCKS, HAVE HER TRY AND BUNDLE UP AND PLACE MIDDLE FINGERS IN HOLE SO THAT SHE KNOWS WHERE HOLE IS WHEN PUTTING ON.     3: PLACE PROSTHESIS ON FLOOR AND TIP BACK ON HEEL, HAVE HER PLACE LIMB INSIDE TO HEAR AT LEAST ONE CLICK PRIOR TO STANDING.  WORK ON HAVING HER PUSH HER KNEE DOWN INTO SOCKET.    4:  CAN PLACE SMALL DOT OF BABY/MINERAL OIL ON KNEE CAP AND RUB UPWARD TO PREVENT FRICTION RUBBING FROM LINER.    5: BEGIN WEARING PROSTHESIS 2 HOURS ON THEN DO 2 HOURS OFF, THEN 2 HOURS ON (CAN DO 2-3 TIMES A DAY LIKE THIS).  AFTER A WEEK, IF SKIN STILL LOOKS GOOD/HEALING, INCREASE TO 3 HOURS ON, 2 HOURS OFF, 3 HOURS ON.  (CAN DO 2-3 TIMES PER DAY AS WELL).    ASSESSMENT:   CLINICAL IMPRESSION: Spent a  decent amount of session today taking pictures of order of donning components of prosthesis for better caregiver carryover.  She did well with 1 ply sock today due to added padding in socket.  Began to check STGs and BERG actually seems to be a bit lower today, will finish at next session.      OBJECTIVE IMPAIRMENTS Abnormal gait, decreased activity tolerance, decreased balance, decreased endurance, decreased knowledge of use of DME, decreased mobility, difficulty walking, decreased ROM, decreased strength, impaired perceived functional ability, impaired flexibility, postural dysfunction, and  prosthetic dependency .    ACTIVITY LIMITATIONS carrying, lifting, bending, standing, squatting, stairs, transfers, bathing, toileting, dressing, reach over head, hygiene/grooming, and locomotion level   PARTICIPATION LIMITATIONS: cleaning, laundry, driving, shopping, and community activity   PERSONAL FACTORS 3+ comorbidities: see above  are also affecting patient's functional outcome.    REHAB POTENTIAL: Good   CLINICAL DECISION MAKING: Evolving/moderate complexity   EVALUATION COMPLEXITY: Moderate     GOALS: Goals reviewed with patient? Yes   UPDATED SHORT TERM GOALS: Target date: 10/03/2021     Pt/caregiver will be IND with ongoing HEP in order to indicate improved functional mobility and dec fall risk. Baseline: Goal status: ONGOING    2.  Pt will be able to don/doff prosthesis consistently at S level and tolerate 6 hours of wear time per day without skin break down. Baseline: Intermittent min A  Goal status: REVISED   3.  Pt will tolerate standing x 15 mins with intermittent support at S level in order to indicate improved independence with ADLs.  Baseline:  Goal status: REVISED    4.  Will improve BERG to >/=31/56 in order to indicate decreased fall risk.   Baseline: (08/12/2022) 27/56 Goal status: REVISED    5.  Will improve gait speed to >/=1.80 ft/sec with RW in order to indicate  dec fall risk.  Baseline: 1.29 ft/sec (08/12/2022); 1.77 ft/sec (09/23/2022) Goal status: REVISED    6.  Pt will ambulate x 200' with RW and prosthesis at S in order to indicate improved household mobility.  Baseline: 200' CGA Goal status: REVISED    UPDATED LONG TERM GOALS: Target date: 11/03/2021     Pt will be IND with final HEP in order to indicate improved functional mobility and dec fall risk. Baseline:  Goal status: ONGOING    2.  Pt will ambulate x 150' indoors and 300' outdoors over unlevel paved surfaces with RW at S level in order to indicate improved household and community mobility.  Baseline:  Goal status: ONGOING    3.  Pt will negotiate up/down curb/ramp with RW and 4 steps with rails at S level in order to indicate improved community access.   Baseline:  Goal status: ONGOING      5.  Pt will increase BERG balance score to >/=36/56 to demonstrate improved static balance.  Baseline: 27/56 (11/21) Goal status: REVISED    6.  Pt will demonstrate a gait speed of >/=2.4 feet/sec in order to decrease risk for falls. Baseline: 1.29 ft/sec w/ RW (11/21); 1.77 ft/sec (1/2) Goal status: REVISED    7.  Pt will don/doff prosthesis at S level and tolerate wearing prosthesis >/=90% of awake time in order to indicate more independent prosthetic care.   Baseline:  Goal status: ONGOING   8.   Will initiate gait training with quad tip cane when appropriate and will update goal.    Baseline:   Goal Status: New     PLAN: PT FREQUENCY: 2x/week   PT DURATION: 8 weeks   PLANNED INTERVENTIONS: Therapeutic exercises, Therapeutic activity, Neuromuscular re-education, Balance training, Gait training, Patient/Family education, Self Care, Joint mobilization, Stair training, Vestibular training, Prosthetic training, DME instructions, and Manual therapy   PLAN FOR NEXT SESSION: How does leg look? Finish BERG and STGs,  continue hip and hamstring stretching, seated scifit,  How is hip  stretch going? Continue to check skin.  Continue prosthetic education.  Prosthetic gait, balance, SLS tasks.  have her work on donning/doffing prosthesis.  Add to  standing exercises.    Cameron Sprang, PT, MPT [facility] [address] [place], [place], [address] Phone: [phone]   Fax:  [phone] 10/09/22, 10:55 AM

## 2022-10-12 ENCOUNTER — Emergency Department (HOSPITAL_BASED_OUTPATIENT_CLINIC_OR_DEPARTMENT_OTHER): Payer: Medicare Other

## 2022-10-12 ENCOUNTER — Encounter (HOSPITAL_BASED_OUTPATIENT_CLINIC_OR_DEPARTMENT_OTHER): Payer: Self-pay

## 2022-10-12 ENCOUNTER — Other Ambulatory Visit: Payer: Self-pay

## 2022-10-12 ENCOUNTER — Emergency Department (HOSPITAL_BASED_OUTPATIENT_CLINIC_OR_DEPARTMENT_OTHER)
Admission: EM | Admit: 2022-10-12 | Discharge: 2022-10-13 | Disposition: A | Discharge status: Home / Self Care | Payer: Medicare Other | Attending: Emergency Medicine | Admitting: Emergency Medicine

## 2022-10-12 DIAGNOSIS — Z794 Long term (current) use of insulin: Secondary | ICD-10-CM | POA: Diagnosis not present

## 2022-10-12 DIAGNOSIS — W01198A Fall on same level from slipping, tripping and stumbling with subsequent striking against other object, initial encounter: Secondary | ICD-10-CM | POA: Diagnosis not present

## 2022-10-12 DIAGNOSIS — Z955 Presence of coronary angioplasty implant and graft: Secondary | ICD-10-CM | POA: Diagnosis not present

## 2022-10-12 DIAGNOSIS — S0990XA Unspecified injury of head, initial encounter: Secondary | ICD-10-CM | POA: Diagnosis not present

## 2022-10-12 DIAGNOSIS — I2510 Atherosclerotic heart disease of native coronary artery without angina pectoris: Secondary | ICD-10-CM | POA: Diagnosis not present

## 2022-10-12 DIAGNOSIS — E041 Nontoxic single thyroid nodule: Secondary | ICD-10-CM

## 2022-10-12 DIAGNOSIS — S51812A Laceration without foreign body of left forearm, initial encounter: Secondary | ICD-10-CM | POA: Diagnosis not present

## 2022-10-12 DIAGNOSIS — E1010 Type 1 diabetes mellitus with ketoacidosis without coma: Secondary | ICD-10-CM | POA: Insufficient documentation

## 2022-10-12 DIAGNOSIS — E10319 Type 1 diabetes mellitus with unspecified diabetic retinopathy without macular edema: Secondary | ICD-10-CM | POA: Diagnosis not present

## 2022-10-12 DIAGNOSIS — S41112A Laceration without foreign body of left upper arm, initial encounter: Secondary | ICD-10-CM

## 2022-10-12 DIAGNOSIS — S59912A Unspecified injury of left forearm, initial encounter: Secondary | ICD-10-CM | POA: Diagnosis present

## 2022-10-12 DIAGNOSIS — T148XXA Other injury of unspecified body region, initial encounter: Secondary | ICD-10-CM

## 2022-10-12 DIAGNOSIS — W19XXXA Unspecified fall, initial encounter: Secondary | ICD-10-CM

## 2022-10-12 MED ORDER — ACETAMINOPHEN 500 MG PO TABS
1000.0000 mg | ORAL_TABLET | Freq: Once | ORAL | Status: AC
  Administered 2022-10-12: 1000 mg via ORAL
  Filled 2022-10-12: qty 2

## 2022-10-12 MED ORDER — LIDOCAINE-EPINEPHRINE (PF) 2 %-1:200000 IJ SOLN
20.0000 mL | Freq: Once | INTRAMUSCULAR | Status: AC
  Administered 2022-10-13: 20 mL
  Filled 2022-10-12: qty 20

## 2022-10-12 MED ORDER — LIDOCAINE-EPINEPHRINE-TETRACAINE (LET) TOPICAL GEL
3.0000 mL | Freq: Once | TOPICAL | Status: AC
  Administered 2022-10-12: 3 mL via TOPICAL
  Filled 2022-10-12: qty 3

## 2022-10-12 NOTE — ED Triage Notes (Incomplete)
BIB ems from [facility], NAD, A&O x 4, GCS 15  Pt tripped over walker and fell hitting left arm   Takes plavix, denies LOC or hitting head.

## 2022-10-12 NOTE — ED Notes (Signed)
Left arm wound irrigated with NS. Tolerated well. LET applied per order.

## 2022-10-12 NOTE — ED Triage Notes (Signed)
  BIB ems from [facility], NAD, A&O x 4, GCS 15   Pt tripped over walker and fell hitting left arm, pt has deep laceration on left forearm approx 10-12 cm, oozing blood. Wound redressed Skin tear to right elbow and left upper arm with bleeding controlled   Takes plavix, denies LOC or hitting head.

## 2022-10-12 NOTE — Discharge Instructions (Addendum)
Do not submerge wound for 14 days in any body of water.  Thyroid nodule seen on CT ask your doctor to order an outpatient thyroid ultrasound

## 2022-10-12 NOTE — ED Provider Notes (Addendum)
[place] EMERGENCY DEPARTMENT AT [facility] Provider Note   CSN: [id number] Arrival date & time: 10/12/22  2156     History  Chief Complaint  Patient presents with   Fall   Extremity Laceration    Caitlin Sharp is a 72 y.o. female.  The history is provided by the patient and a relative.  Fall This is a new problem. The current episode started 3 to 5 hours ago. The problem occurs constantly. The problem has been resolved. Nothing aggravates the symptoms. Nothing relieves the symptoms. She has tried nothing for the symptoms. The treatment provided no relief.  Patient with fall on ASA and plavix tripped over a walker and fell with with multiple skin tears and a wound of the LUE.      Past Medical History:  Diagnosis Date   Abnormal thyroid blood test    Per Records recevied from [facility], Dr.Pearson   Acute deep vein thrombosis of right peroneal vein (HCC) 10/05/2020   RLE DVT   Anxiety    Arthritis    RA   Coronary artery disease    Diabetes type I [facility])    Per [facility] New Patient Packet   Diabetic retinopathy (HCC)    Per [facility] New Patient Packet   DKA (diabetic ketoacidosis) (HCC) 2021   Dr.William Zackowski.  Insulin Pump was out of Insulin   Glaucoma    Per [facility] New Patient Packet   H/O heart bypass surgery 2012   Dr. Excell Seltzer, Per [facility] New Patient Packet   Heart murmur    when I was a teenager   History of blood transfusion    History of kidney stones    passed   Hx of CABG    Per records received form [facility], Dr.Pearson    Left bundle branch block    Mild mitral regurgitation    Per records from [facility], Dr.Pearson    Neuropathy 1976   Per [facility] New Patient Packet   NSTEMI (non-ST elevated myocardial infarction) (HCC) 09/21/2020   NSTEMI/demand ischemic in setting of DKA, patent grafts by 09/24/20 [facility], continue medical therapy   Osteoporosis    T Score -2.7 , Per [facility] New Patient Packet   Proliferative diabetic retinopathy of both eyes  associated with diabetes mellitus due to underlying condition [facility])    Per Records recevied from [facility], Dr.Pearson   PVD (peripheral vascular disease) (HCC) 10/27/2021   Type 1 diabetes mellitus with hyperglycemia (HCC) 10/28/2021    Home Medications Prior to Admission medications   Medication Sig Start Date End Date Taking? Authorizing Provider  acetaminophen (TYLENOL) 500 MG tablet Take 1,000 mg by mouth 2 (two) times daily as needed for moderate pain.    [provider]  atorvastatin (LIPITOR) 40 MG tablet Take 40 mg by mouth at bedtime.    [provider]  Biotin w/ Vitamins C & E (HAIR/SKIN/NAILS PO) Take 1 tablet by mouth in the morning.    [provider]  brimonidine (ALPHAGAN) 0.2 % ophthalmic solution Place 1 drop into the left eye 3 (three) times daily.    [provider]  buPROPion (WELLBUTRIN XL) 150 MG 24 hr tablet Take 1 tablet (150 mg total) by mouth daily. 09/24/22 10/24/22  Octavia Heir, NP  calcium carbonate (OSCAL) 1500 (600 Ca) MG TABS tablet Take 600 mg of elemental calcium by mouth daily.    [provider]  clopidogrel (PLAVIX) 75 MG tablet Take 75 mg by mouth  in the morning.    [provider]  Continuous Blood Gluc Sensor (FREESTYLE LIBRE 14 DAY SENSOR) MISC Inject 1 sensor to the skin every 14 days for continuous glucose monitoring. 08/19/21   [provider]  docusate sodium (COLACE) 100 MG capsule Take 100 mg by mouth as needed for mild constipation.    [provider]  glucosamine-chondroitin 500-400 MG tablet Take 1 tablet by mouth every morning.    [provider]  Glucose 2 g CHEW Chew 1 tablet by mouth as needed. Give one tablet for blood sugar < 70, may repeat every 15 mins until blood glucose > 70, may give with juice and other foods.    [provider]  insulin glargine (LANTUS SOLOSTAR) 100 UNIT/ML Solostar Pen Inject 12 Units into the skin daily. In the morning. PATIENT  SELF ADMINISTERS    [provider]  insulin glargine (LANTUS) 100 UNIT/ML injection Inject 3 Units into the skin daily. 9:00 pm-10:00 pm    [provider]  insulin lispro (HUMALOG) 100 UNIT/ML KwikPen Inject 5-15 Units into the skin See admin instructions. If blood sugar is 151-200 = 5 units, 201-250 = 8 units, 251-300  = 10 units, 301-350 = 12 units, 351-400 = 14 units, 401 and above =  16.  If blood sugar is greater than 200 inject 5 units at bedtime, if greater than 500 call on call provider    [provider]  Insulin Pen Needle (PEN NEEDLES 3/16") 31G X 5 MM MISC 1 Device by Does not apply route 5 (five) times daily. With insulin administration    [provider]  latanoprost (XALATAN) 0.005 % ophthalmic solution Place 1 drop into both eyes every evening.    [provider]  lisinopril (ZESTRIL) 2.5 MG tablet Take 2.5 mg by mouth in the morning.    [provider]  miconazole (MICOTIN) 200 MG vaginal suppository Place 200 mg vaginally at bedtime as needed (vaginal itching).    [provider]  Multiple Vitamins-Minerals (I-VITE PO) Take 1 tablet by mouth in the morning.    [provider]  nitroGLYCERIN (NITROSTAT) 0.4 MG SL tablet Place 0.4 mg under the tongue every 5 (five) minutes as needed for chest pain.    [provider]  Omega-3 Fatty Acids (FISH OIL) 1000 MG CAPS Take 1,000 mg by mouth in the morning.    [provider]  omeprazole (PRILOSEC) 20 MG capsule Take 20 mg by mouth daily.    [provider]  sennosides-docusate sodium (SENOKOT-S) 8.6-50 MG tablet Take 1 tablet by mouth every other day. IN THE MORNING    [provider]  timolol (TIMOPTIC) 0.5 % ophthalmic solution Place 1 drop into the left eye 2 (two) times daily. 03/25/21   [provider]      Allergies    Penicillins    Review of Systems   Review of Systems  Constitutional:  Negative for fever.  HENT:   Negative for facial swelling.   Respiratory:  Negative for wheezing and stridor.   Skin:  Positive for wound.  All other systems reviewed and are negative.   Physical Exam Updated Vital Signs BP (!) 197/68   Pulse 84   Temp 98.6 F (37 C) (Oral)   Resp 18   Ht 5\' 7"  (1.702 m)   Wt 55.3 kg   SpO2 100%   BMI 19.11 kg/m  Physical Exam Vitals and nursing note reviewed.  Constitutional:  General: She is not in acute distress.    Appearance: Normal appearance. She is well-developed.  HENT:     Head: Normocephalic and atraumatic.  Eyes:     Pupils: Pupils are equal, round, and reactive to light.  Cardiovascular:     Rate and Rhythm: Normal rate and regular rhythm.     Pulses: Normal pulses.     Heart sounds: Normal heart sounds.  Pulmonary:     Effort: Pulmonary effort is normal. No respiratory distress.     Breath sounds: Normal breath sounds.  Abdominal:     General: Bowel sounds are normal. There is no distension.     Palpations: Abdomen is soft.     Tenderness: There is no abdominal tenderness. There is no guarding or rebound.  Genitourinary:    Vagina: No vaginal discharge.  Musculoskeletal:        General: Normal range of motion.       Arms:     Cervical back: Neck supple.  Skin:    General: Skin is dry.     Capillary Refill: Capillary refill takes less than 2 seconds.     Findings: No erythema or rash.  Neurological:     General: No focal deficit present.     Mental Status: She is alert.     Deep Tendon Reflexes: Reflexes normal.  Psychiatric:        Mood and Affect: Mood normal.     ED Results / Procedures / Treatments   Labs (all labs ordered are listed, but only abnormal results are displayed) Labs Reviewed - No data to display  EKG None  Radiology DG Forearm Left  Result Date: 10/12/2022 CLINICAL DATA:  Status post fall. EXAM: LEFT FOREARM - 2 VIEW COMPARISON:  None Available. FINDINGS: There is no evidence of an acute fracture or other  focal bone lesions. There is moderate to marked severity vascular calcification. Moderate severity soft tissue swelling is seen along the dorsal aspect of the mid left forearm. Associated superficial soft tissue defect is seen within this region. IMPRESSION: Moderate severity soft tissue swelling with superficial soft tissue defect along the dorsal aspect of the mid left forearm. Electronically Signed   By: Virgina Norfolk M.D.   On: 10/12/2022 23:08    Procedures .Marland KitchenLaceration Repair  Date/Time: 10/12/2022 11:55 PM  Performed by: Veatrice Kells, MD Authorized by: Veatrice Kells, MD   Consent:    Consent obtained:  Verbal   Consent given by:  Patient   Risks discussed:  Infection, need for additional repair, nerve damage, pain, poor cosmetic result and poor wound healing   Alternatives discussed:  Delayed treatment Universal protocol:    Patient identity confirmed:  Arm band Anesthesia:    Anesthesia method:  Topical application and local infiltration   Topical anesthetic:  LET   Local anesthetic:  Lidocaine 1% WITH epi Laceration details:    Location:  Shoulder/arm   Shoulder/arm location:  L lower arm   Length (cm):  7.6   Depth (mm):  1 Pre-procedure details:    Preparation:  Patient was prepped and draped in usual sterile fashion Exploration:    Hemostasis achieved with:  Direct pressure   Imaging outcome: foreign body not noted     Wound extent: no foreign body, no nerve damage and no tendon damage   Treatment:    Area cleansed with:  Povidone-iodine, chlorhexidine and saline   Amount of cleaning:  Standard   Irrigation solution:  Sterile saline   Irrigation  volume:  1 liter   Irrigation method:  Syringe   Debridement:  None   Undermining:  None Skin repair:    Repair method:  Staples   Number of staples:  10 Approximation:    Approximation:  Close (distal skin tear was not repairecx) Repair type:    Repair type:  Complex Post-procedure details:    Dressing:   Sterile dressing   Procedure completion:  Tolerated well, no immediate complications     Medications Ordered in ED Medications  lidocaine-EPINEPHrine-tetracaine (LET) topical gel (has no administration in time range)  acetaminophen (TYLENOL) tablet 1,000 mg (has no administration in time range)    ED Course/ Medical Decision Making/ A&P                             Medical Decision Making Patient tripped over walker at facility   Amount and/or Complexity of Data Reviewed Independent Historian: EMS    Details: See above  External Data Reviewed: notes.    Details: Previous notes reviewed  Radiology: ordered and independent interpretation performed.    Details: Negative head CT by me   Risk OTC drugs. Prescription drug management. Risk Details: Wound cleansed and irrigated.  Closed proximally.  Skin tear could not be repaired.  Sterile dressing applied.  Wound care instructions given.  Staple removal in 12 days at urgent care.  No submersion in any body of water for 14 days.  This information was communicated verbally with nurse and tech present and in writing on discharge paperwork.  Patient and son also informed of thyroid nodule verbally and need for close follow up for outpatient [place] and both verbalize understanding and agree to follow up.  This was also provided in writing on discharge paperwork.  Mupirocin topically twice daily for 14 days.  Strict return precautions given    Final Clinical Impression(s) / ED Diagnoses Final diagnoses:  None   Return for intractable cough, coughing up blood, fevers > 100.4 unrelieved by medication, shortness of breath, intractable vomiting, chest pain, shortness of breath, weakness, numbness, changes in speech, facial asymmetry, abdominal pain, passing out, Inability to tolerate liquids or food, cough, altered mental status or any concerns. No signs of systemic illness or infection. The patient is nontoxic-appearing on exam and vital signs are  within normal limits.  I have reviewed the triage vital signs and the nursing notes. Pertinent labs & imaging results that were available during my care of the patient were reviewed by me and considered in my medical decision making (see chart for details). After history, exam, and medical workup I feel the patient has been appropriately medically screened and is safe for discharge home. Pertinent diagnoses were discussed with the patient. Patient was given return precautions.       Yesika Rispoli, MD 10/13/22 [phone]

## 2022-10-13 MED ORDER — MUPIROCIN CALCIUM 2 % EX CREA: 1.0000 | TOPICAL_CREAM | Freq: Two times a day (BID) | CUTANEOUS | 0 refills | Status: DC

## 2022-10-14 ENCOUNTER — Ambulatory Visit: Payer: Medicare Other | Admitting: Physical Therapy

## 2022-10-16 ENCOUNTER — Encounter: Payer: Medicare Other | Admitting: Rehabilitation

## 2022-10-17 ENCOUNTER — Telehealth: Payer: Self-pay | Admitting: Adult Health

## 2022-10-17 ENCOUNTER — Other Ambulatory Visit: Payer: Self-pay | Admitting: Adult Health

## 2022-10-17 DIAGNOSIS — E041 Nontoxic single thyroid nodule: Secondary | ICD-10-CM

## 2022-10-17 NOTE — Telephone Encounter (Signed)
Thyroid ultrasound with inconclusive results and recommendations. Right thyroid lobe midpole nodule measured 1.2cm x 1.2cm x 1.0 cm.  Discussed findings with Dr Lyndel Safe and resident's son Ovid Curd Will repeat ultrasound with [facility] imaging  Staff to communicate with Ms. Deines as well

## 2022-10-21 ENCOUNTER — Ambulatory Visit: Payer: Medicare Other | Admitting: Physical Therapy

## 2022-10-23 ENCOUNTER — Ambulatory Visit: Payer: Medicare Other | Admitting: Rehabilitation

## 2022-11-03 ENCOUNTER — Non-Acute Institutional Stay: Payer: Medicare Other | Admitting: Internal Medicine

## 2022-11-03 DIAGNOSIS — F331 Major depressive disorder, recurrent, moderate: Secondary | ICD-10-CM | POA: Diagnosis not present

## 2022-11-03 DIAGNOSIS — R21 Rash and other nonspecific skin eruption: Secondary | ICD-10-CM

## 2022-11-04 ENCOUNTER — Other Ambulatory Visit: Payer: Medicare Other

## 2022-11-04 NOTE — Progress Notes (Signed)
Location: Occupational psychologist of Service:  ALF (13)  Provider:   Code Status: Full Code Goals of Care:     10/12/2022   10:03 PM  Advanced Directives  Does Patient Have a Medical Advance Directive? No     Chief Complaint  Patient presents with   Acute Visit    HPI: Patient is a 72 y.o. female seen today for an acute visit for Itching and Rash   Lives in AL in [place]   Patient H/o Type 1 Diabetes For past 76 years. Has been on Pump but now Long Acting Insulin and Novolog now CAD S/o CABG in 2012 osteoporosis 2021 -2.7 Glaucoma with Poor Vision Mild Cognitive impairment Underwent BKA by Dr Doran Durand on 02/06/22 For Chronic Osteomyelitis     Was seen few weeks ago for depression and was started on Wellbutrin Patient says she had bad reaction with it and since then she has been itching all over. The Wellbutrin was stopped after few days 01/03-01/09 only  Nurses think she is itching and picking due to anxiety Most of her places are in her stomach and arms and neck area They are now better Patient Adamantly denies depression and says she is doing well and does not need anything new Repeats herself a lot .  Other issues Cognitive impairment MMSE 29/30 Passed her Clock Daytime Sleepiness Continues ot have issue with Fatigue and sleepiness during daytime      Past Medical History:  Diagnosis Date   Abnormal thyroid blood test    Per Records recevied from [facility], Dr.Pearson   Acute deep vein thrombosis of right peroneal vein ([place]) 10/05/2020   RLE DVT   Anxiety    Arthritis    RA   Coronary artery disease    Diabetes type I [facility])    Per [facility] New Patient Packet   Diabetic retinopathy ([place])    Per [facility] New Patient Packet   DKA (diabetic ketoacidosis) ([place]) 2021   Dr.William Zackowski.  Insulin Pump was out of Insulin   Glaucoma    Per [place] Patient Packet   H/O heart bypass surgery 2012   Dr. Luana Shu, Per [facility] New Patient Packet    Heart murmur    when I was a teenager   History of blood transfusion    History of kidney stones    passed   Hx of CABG    Per records received form [facility], Dr.Pearson    Left bundle branch block    Mild mitral regurgitation    Per records from [facility] [place]    Neuropathy 1976   Per [place] Patient Packet   NSTEMI (non-ST elevated myocardial infarction) ([place]) 09/21/2020   NSTEMI/demand ischemic in setting of DKA, patent grafts by 09/24/20 [facility], continue medical therapy   Osteoporosis    T Score -2.7 , Per [place] New Patient Packet   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 due to underlying condition [facility])    Per Records recevied from [facility], Dr.Pearson   PVD (peripheral vascular disease) ([place]) 10/27/2021   Type 1 diabetes mellitus with hyperglycemia ([place]) 10/28/2021    Past Surgical History:  Procedure Laterality Date   ABDOMINAL AORTOGRAM W/LOWER EXTREMITY N/A 10/02/2021   Procedure: ABDOMINAL AORTOGRAM W/LOWER EXTREMITY;  Surgeon: Broadus John, MD;  Location: [place] CV LAB;  Service: Cardiovascular;  Laterality: N/A;   AMPUTATION Right 04/17/2021   Procedure: RIGHT GREAT TOE AMPUTATION;  Surgeon:  Newt Minion, MD;  Location: [place];  Service: Orthopedics;  Laterality: Right;   AMPUTATION Right 07/19/2021   Procedure: RIGHT FOOT 1ST RAY AMPUTATION;  Surgeon: Newt Minion, MD;  Location: [place];  Service: Orthopedics;  Laterality: Right;   AMPUTATION Right 02/06/2022   Procedure: AMPUTATION BELOW KNEE;  Surgeon: Wylene Simmer, MD;  Location: [place];  Service: Orthopedics;  Laterality: Right;  regional block   CESAREAN SECTION  1970   Dr. Moses Manners, Per [facility] New Patient Packet   CESAREAN SECTION  1973   Dr. Moses Manners, Per [facility] New Patient Packet   CORONARY ARTERY BYPASS GRAFT      Allergies  Allergen Reactions   Penicillins Anaphylaxis    Outpatient Encounter Medications as of 11/03/2022  Medication Sig    acetaminophen (TYLENOL) 500 MG tablet Take 1,000 mg by mouth 2 (two) times daily as needed for moderate pain.   atorvastatin (LIPITOR) 40 MG tablet Take 40 mg by mouth at bedtime.   Biotin w/ Vitamins C & E (HAIR/SKIN/NAILS PO) Take 1 tablet by mouth in the morning.   brimonidine (ALPHAGAN) 0.2 % ophthalmic solution Place 1 drop into the left eye 3 (three) times daily.   calcium carbonate (OSCAL) 1500 (600 Ca) MG TABS tablet Take 600 mg of elemental calcium by mouth daily.   clopidogrel (PLAVIX) 75 MG tablet Take 75 mg by mouth in the morning.   Continuous Blood Gluc Sensor (FREESTYLE LIBRE 14 DAY SENSOR) MISC Inject 1 sensor to the skin every 14 days for continuous glucose monitoring.   docusate sodium (COLACE) 100 MG capsule Take 100 mg by mouth as needed for mild constipation.   glucosamine-chondroitin 500-400 MG tablet Take 1 tablet by mouth every morning.   Glucose 2 g CHEW Chew 1 tablet by mouth as needed. Give one tablet for blood sugar < 70, may repeat every 15 mins until blood glucose > 70, may give with juice and other foods.   insulin glargine (LANTUS SOLOSTAR) 100 UNIT/ML Solostar Pen Inject 12 Units into the skin daily. In the morning. PATIENT SELF ADMINISTERS   insulin glargine (LANTUS) 100 UNIT/ML injection Inject 3 Units into the skin daily. 9:00 pm-10:00 pm   insulin lispro (HUMALOG) 100 UNIT/ML KwikPen Inject 5-15 Units into the skin See admin instructions. If blood sugar is 151-200 = 5 units, 201-250 = 8 units, 251-300  = 10 units, 301-350 = 12 units, 351-400 = 14 units, 401 and above =  16.  If blood sugar is greater than 200 inject 5 units at bedtime, if greater than 500 call on call provider   Insulin Pen Needle (PEN NEEDLES 3/16") 31G X 5 MM MISC 1 Device by Does not apply route 5 (five) times daily. With insulin administration   latanoprost (XALATAN) 0.005 % ophthalmic solution Place 1 drop into both eyes every evening.   lisinopril (ZESTRIL) 2.5 MG tablet Take 2.5 mg by mouth  in the morning.   miconazole (MICOTIN) 200 MG vaginal suppository Place 200 mg vaginally at bedtime as needed (vaginal itching).   Multiple Vitamins-Minerals (I-VITE PO) Take 1 tablet by mouth in the morning.   mupirocin cream (BACTROBAN) 2 % Apply 1 Application topically 2 (two) times daily.   nitroGLYCERIN (NITROSTAT) 0.4 MG SL tablet Place 0.4 mg under the tongue every 5 (five) minutes as needed for chest pain.   Omega-3 Fatty Acids (FISH OIL) 1000 MG CAPS Take 1,000 mg by mouth in the morning.   omeprazole (PRILOSEC) 20 MG  capsule Take 20 mg by mouth daily.   sennosides-docusate sodium (SENOKOT-S) 8.6-50 MG tablet Take 1 tablet by mouth every other day. IN THE MORNING   timolol (TIMOPTIC) 0.5 % ophthalmic solution Place 1 drop into the left eye 2 (two) times daily.   No facility-administered encounter medications on file as of 11/03/2022.    Review of Systems:  Review of Systems  Constitutional:  Negative for activity change and appetite change.  HENT: Negative.    Respiratory:  Negative for cough and shortness of breath.   Cardiovascular:  Negative for leg swelling.  Gastrointestinal:  Negative for constipation.  Genitourinary: Negative.   Musculoskeletal:  Positive for gait problem. Negative for arthralgias and myalgias.  Skin:  Positive for rash.  Neurological:  Negative for dizziness and weakness.  Psychiatric/Behavioral:  Positive for confusion. Negative for dysphoric mood and sleep disturbance.     Health Maintenance  Topic Date Due   Medicare Annual Wellness (AWV)  Never done   FOOT EXAM  Never done   OPHTHALMOLOGY EXAM  Never done   Diabetic kidney evaluation - Urine ACR  Never done   Hepatitis C Screening  Never done   COLONOSCOPY (Pts 45-39yr Insurance coverage will need to be confirmed)  Never done   MAMMOGRAM  Never done   Zoster Vaccines- Shingrix (1 of 2) Never done   Pneumonia Vaccine 73 Years old (1 of 1 - PCV) Never done   INFLUENZA VACCINE  04/22/2022    COVID-19 Vaccine (3 - 2023-24 season) 05/23/2022   HEMOGLOBIN A1C  07/27/2022   Diabetic kidney evaluation - eGFR measurement  08/24/2023   DTaP/Tdap/Td (2 - Td or Tdap) 07/11/2024   DEXA SCAN  Completed   HPV VACCINES  Aged Out    Physical Exam: There were no vitals filed for this visit. There is no height or weight on file to calculate BMI. Physical Exam Vitals reviewed.  Constitutional:      Appearance: Normal appearance.  HENT:     Head: Normocephalic.     Nose: Nose normal.     Mouth/Throat:     Mouth: Mucous membranes are moist.     Pharynx: Oropharynx is clear.  Eyes:     Pupils: Pupils are equal, round, and reactive to light.  Cardiovascular:     Rate and Rhythm: Normal rate and regular rhythm.     Pulses: Normal pulses.     Heart sounds: Normal heart sounds. No murmur heard. Pulmonary:     Effort: Pulmonary effort is normal.     Breath sounds: Normal breath sounds.  Abdominal:     General: Abdomen is flat. Bowel sounds are normal.     Palpations: Abdomen is soft.  Musculoskeletal:     Cervical back: Neck supple.  Skin:    General: Skin is warm.     Comments: Papular rash in her Arms and neck Some in her stomach Getting better   Neurological:     Mental Status: She is alert.  Psychiatric:        Mood and Affect: Mood normal.        Thought Content: Thought content normal.     Labs reviewed: Basic Metabolic Panel: Recent Labs    02/06/22 0612 02/07/22 0222 02/11/22 0000 04/22/22 0000 07/17/22 0000 08/09/22 1226 08/23/22 0000  NA 137 137   < > 136* 143 133* 139  K 4.3 4.4   < > 4.7 4.7 4.5 4.3  CL 101 104   < > 98* 106  97* 104  CO2 27 27   < > 24* 29* 24* 24*  GLUCOSE 277* 248*  --   --   --   --   --   BUN 24* 22   < > 34* 25* 25* 42*  CREATININE 0.79 0.86   < > 0.9 1.0  --  0.9  CALCIUM 9.4 8.5*   < > 9.6 9.2 9.3 9.5  TSH  --   --   --  3.78  --   --   --    < > = values in this interval not displayed.   Liver Function Tests: Recent Labs     12/18/21 0000 04/22/22 0000 07/17/22 0000 08/23/22 0000  AST 14 18 18 19  $ ALT 9 15 13 18  $ ALKPHOS 139* 118 65 74  ALBUMIN 3.2*  --  3.7 3.6   No results for input(s): "LIPASE", "AMYLASE" in the last 8760 hours. No results for input(s): "AMMONIA" in the last 8760 hours. CBC: Recent Labs    02/06/22 0612 02/07/22 0222 02/11/22 0000 03/17/22 0000 04/22/22 0000 07/17/22 0000  WBC 7.7 7.6   < > 6.4 5.5 5.2  NEUTROABS  --   --   --   --  3.20  --   HGB 10.2* 8.5*   < > 10.3* 11.2* 10.7*  HCT 31.7* 26.6*   < > 31* 33* 32*  MCV 88.5 87.5  --   --   --   --   PLT 367 325   < > 288 262 266   < > = values in this interval not displayed.   Lipid Panel: Recent Labs    04/22/22 0000  CHOL 173  HDL 81*  LDLCALC 76  TRIG 77   Lab Results  Component Value Date   HGBA1C 12.7 01/24/2022    Procedures since last visit: CT Cervical Spine Wo Contrast  Result Date: 10/12/2022 CLINICAL DATA:  Status post fall. EXAM: CT CERVICAL SPINE WITHOUT CONTRAST TECHNIQUE: Multidetector CT imaging of the cervical spine was performed without intravenous contrast. Multiplanar CT image reconstructions were also generated. RADIATION DOSE REDUCTION: This exam was performed according to the departmental dose-optimization program which includes automated exposure control, adjustment of the mA and/or kV according to patient size and/or use of iterative reconstruction technique. COMPARISON:  None Available. FINDINGS: Alignment: There is approximately 1 mm anterolisthesis of the C4 vertebral body on C5. Skull base and vertebrae: No acute fracture. No primary bone lesion or focal pathologic process. Soft tissues and spinal canal: No prevertebral fluid or swelling. No visible canal hematoma. Disc levels: Marked severity endplate sclerosis and posterior bony spurring are seen at the level of C5-C6. Mild anterior osteophyte formation is seen at the levels of C4-C5, C5-C6 and C6-C7. There is marked severity narrowing  of the anterior atlantoaxial articulation. Marked severity intervertebral disc space narrowing is seen at C5-C6. Bilateral marked severity multilevel facet joint hypertrophy is noted. Upper chest: Negative. Other: A 9 mm x 8 mm attenuation thyroid nodule is seen within the right lobe of the thyroid gland. IMPRESSION: 1. No acute fracture or traumatic subluxation of the cervical spine. 2. Marked severity degenerative changes, most prominent at the level of C5-C6. 3. Thyroid nodule within the right lobe of the thyroid gland. Correlation with nonemergent thyroid gland ultrasound is recommended. This follows ACR consensus guidelines: Managing Incidental Thyroid Nodules Detected on Imaging: White Paper of the ACR Incidental Thyroid Findings Committee. J Am Coll Radiol 2015; 12:143-150. Electronically Signed  By: Virgina Norfolk M.D.   On: 10/12/2022 23:37   CT Head Wo Contrast  Result Date: 10/12/2022 CLINICAL DATA:  Status post fall. EXAM: CT HEAD WITHOUT CONTRAST TECHNIQUE: Contiguous axial images were obtained from the base of the skull through the vertex without intravenous contrast. RADIATION DOSE REDUCTION: This exam was performed according to the departmental dose-optimization program which includes automated exposure control, adjustment of the mA and/or kV according to patient size and/or use of iterative reconstruction technique. COMPARISON:  September 21, 2020 FINDINGS: Brain: There is mild to moderate severity cerebral atrophy with widening of the extra-axial spaces and ventricular dilatation. There are areas of decreased attenuation within the white matter tracts of the supratentorial brain, consistent with microvascular disease changes. Vascular: There is marked severity bilateral cavernous carotid artery calcification. Skull: Normal. Negative for fracture or focal lesion. Sinuses/Orbits: No acute finding. Other: None. IMPRESSION: 1. No acute intracranial abnormality. 2. Generalized cerebral atrophy  and microvascular disease changes of the supratentorial brain. Electronically Signed   By: Virgina Norfolk M.D.   On: 10/12/2022 23:33   DG Forearm Left  Result Date: 10/12/2022 CLINICAL DATA:  Status post fall. EXAM: LEFT FOREARM - 2 VIEW COMPARISON:  None Available. FINDINGS: There is no evidence of an acute fracture or other focal bone lesions. There is moderate to marked severity vascular calcification. Moderate severity soft tissue swelling is seen along the dorsal aspect of the mid left forearm. Associated superficial soft tissue defect is seen within this region. IMPRESSION: Moderate severity soft tissue swelling with superficial soft tissue defect along the dorsal aspect of the mid left forearm. Electronically Signed   By: Virgina Norfolk M.D.   On: 10/12/2022 23:08    Assessment/Plan 1. Rash and nonspecific skin eruption She is refusing for Hydrocortisone cream I have written order for PRN in case if she needs it later  2. Moderate episode of recurrent major depressive disorder ([place]) Wellbutrin Discontinued She is refusing Any new med      Labs/tests ordered:  * No order type specified * Next appt:  12/02/2022

## 2022-11-11 ENCOUNTER — Encounter: Payer: Self-pay | Admitting: Internal Medicine

## 2022-12-02 ENCOUNTER — Encounter (INDEPENDENT_AMBULATORY_CARE_PROVIDER_SITE_OTHER): Payer: Medicare Other | Admitting: Internal Medicine

## 2022-12-16 ENCOUNTER — Non-Acute Institutional Stay: Payer: Medicare Other | Admitting: Internal Medicine

## 2022-12-16 ENCOUNTER — Encounter: Payer: Self-pay | Admitting: Internal Medicine

## 2022-12-16 VITALS — BP 120/68 | HR 74 | Temp 98.0°F | Resp 17 | Ht 67.0 in

## 2022-12-16 DIAGNOSIS — Z89511 Acquired absence of right leg below knee: Secondary | ICD-10-CM | POA: Diagnosis not present

## 2022-12-16 DIAGNOSIS — M810 Age-related osteoporosis without current pathological fracture: Secondary | ICD-10-CM

## 2022-12-16 DIAGNOSIS — E1021 Type 1 diabetes mellitus with diabetic nephropathy: Secondary | ICD-10-CM | POA: Diagnosis not present

## 2022-12-16 DIAGNOSIS — R4189 Other symptoms and signs involving cognitive functions and awareness: Secondary | ICD-10-CM

## 2022-12-16 NOTE — Progress Notes (Signed)
Location: [facility] features editor of Service:  Clinic (12)  Provider:   Code Status:  Goals of Care:     12/16/2022    1:46 PM  Advanced Directives  Does Patient Have a Medical Advance Directive? Yes  Type of Estate agent of [place];Living will     Chief Complaint  Patient presents with   Acute Visit   Immunizations    Discussed the need for shingles, pneumonia, and covid vaccine patient declined the need for any more vaccines   Quality Metric Gaps    Discusses the need for eye and foot exam, hep C screening, colonoscopy, and mammogram    HPI: Patient is a 72 y.o. female seen today for an acute visit for Follow up of her DEXA  Lives in [place] in [place]  She had Bone density done few months ago 11/23 Finally she agreed today to discuss her different options They were unable to perform her Hip or Spine exam as she is s/p BKA and Cannot stand Her Right Forearm radius was -3.2 I discussed all these findings with her Patient did not have any acute complaints.  She is mostly wheelchair-bound.   Rarely uses her prosthesis to walk with mild assist    Patient H/o Type 1 Diabetes For past 50 years. Has been on Pump but now Long Acting Insulin and Novolog now CAD S/o CABG in 2012 osteoporosis 2021 -2.7 Glaucoma with Poor Vision Mild Cognitive impairment Underwent BKA by Dr Victorino Dike on 02/06/22 For Chronic Osteomyelitis      Past Medical History:  Diagnosis Date   Abnormal thyroid blood test    Per Records recevied from [facility], Dr.Pearson   Acute deep vein thrombosis of right peroneal vein (HCC) 10/05/2020   RLE DVT   Anxiety    Arthritis    RA   Coronary artery disease    Diabetes type I [facility])    Per [facility] New Patient Packet   Diabetic retinopathy (HCC)    Per [facility] New Patient Packet   DKA (diabetic ketoacidosis) (HCC) 2021   Dr.William Zackowski.  Insulin Pump was out of Insulin   Glaucoma    Per [facility] New Patient Packet    H/O heart bypass surgery 2012   Dr. Excell Seltzer, Per [facility] New Patient Packet   Heart murmur    when I was a teenager   History of blood transfusion    History of kidney stones    passed   Hx of CABG    Per records received form [facility], Dr.Pearson    Left bundle branch block    Mild mitral regurgitation    Per records from [facility], Dr.Pearson    Neuropathy 1976   Per [facility] New Patient Packet   NSTEMI (non-ST elevated myocardial infarction) (HCC) 09/21/2020   NSTEMI/demand ischemic in setting of DKA, patent grafts by 09/24/20 [facility], continue medical therapy   Osteoporosis    T Score -2.7 , Per [facility] New Patient Packet   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 due to underlying condition [facility])    Per Records recevied from [facility], Dr.Pearson   PVD (peripheral vascular disease) (HCC) 10/27/2021   Type 1 diabetes mellitus with hyperglycemia (HCC) 10/28/2021    Past Surgical History:  Procedure Laterality Date   ABDOMINAL AORTOGRAM W/LOWER EXTREMITY N/A 10/02/2021   Procedure: ABDOMINAL AORTOGRAM W/LOWER EXTREMITY;  Surgeon: Victorino Sparrow, MD;  Location: [facility] INVASIVE CV LAB;  Service: Cardiovascular;  Laterality: N/A;   AMPUTATION Right 04/17/2021   Procedure: RIGHT GREAT TOE AMPUTATION;  Surgeon: [name], MD;  Location: [facility] OR;  Service: Orthopedics;  Laterality: Right;   AMPUTATION Right 07/19/2021   Procedure: RIGHT FOOT 1ST RAY AMPUTATION;  Surgeon: [name], MD;  Location: [facility] OR;  Service: Orthopedics;  Laterality: Right;   AMPUTATION Right 02/06/2022   Procedure: AMPUTATION BELOW KNEE;  Surgeon: Toni Arthurs, MD;  Location: [facility] OR;  Service: Orthopedics;  Laterality: Right;  regional block   CESAREAN SECTION  1970   Dr. Aquilla Hacker, Per [facility] New Patient Packet   CESAREAN SECTION  1973   Dr. Aquilla Hacker, Per [facility] New Patient Packet   CORONARY ARTERY BYPASS GRAFT      Allergies  Allergen Reactions   Penicillins Anaphylaxis     Outpatient Encounter Medications as of 12/16/2022  Medication Sig   acetaminophen (TYLENOL) 500 MG tablet Take 1,000 mg by mouth 2 (two) times daily as needed for moderate pain.   atorvastatin (LIPITOR) 40 MG tablet Take 40 mg by mouth at bedtime.   Biotin w/ Vitamins C & E (HAIR/SKIN/NAILS PO) Take 1 tablet by mouth in the morning.   brimonidine (ALPHAGAN) 0.2 % ophthalmic solution Place 1 drop into the left eye 3 (three) times daily.   calcium carbonate (OSCAL) 1500 (600 Ca) MG TABS tablet Take 600 mg of elemental calcium by mouth daily.   clopidogrel (PLAVIX) 75 MG tablet Take 75 mg by mouth in the morning.   Continuous Blood Gluc Sensor (FREESTYLE LIBRE 14 DAY SENSOR) MISC Inject 1 sensor to the skin every 14 days for continuous glucose monitoring.   glucosamine-chondroitin 500-400 MG tablet Take 1 tablet by mouth every morning.   Glucose 2 g CHEW Chew 1 tablet by mouth as needed. Give one tablet for blood sugar < 70, may repeat every 15 mins until blood glucose > 70, may give with juice and other foods.   insulin glargine (LANTUS SOLOSTAR) 100 UNIT/ML Solostar Pen Inject 12 Units into the skin daily. In the morning. PATIENT SELF ADMINISTERS   insulin glargine (LANTUS) 100 UNIT/ML injection Inject 3 Units into the skin daily. 9:00 pm-10:00 pm   insulin lispro (HUMALOG) 100 UNIT/ML KwikPen Inject 5-15 Units into the skin See admin instructions. If blood sugar is 151-200 = 5 units, 201-250 = 8 units, 251-300  = 10 units, 301-350 = 12 units, 351-400 = 14 units, 401 and above =  16.  If blood sugar is greater than 200 inject 5 units at bedtime, if greater than 500 call on call provider   Insulin Pen Needle (PEN NEEDLES 3/16") 31G X 5 MM MISC 1 Device by Does not apply route 5 (five) times daily. With insulin administration   latanoprost (XALATAN) 0.005 % ophthalmic solution Place 1 drop into both eyes every evening.   lisinopril (ZESTRIL) 2.5 MG tablet Take 2.5 mg by mouth in the morning.    Multiple Vitamins-Minerals (I-VITE PO) Take 1 tablet by mouth in the morning.   mupirocin cream (BACTROBAN) 2 % Apply 1 Application topically 2 (two) times daily.   nitroGLYCERIN (NITROSTAT) 0.4 MG SL tablet Place 0.4 mg under the tongue every 5 (five) minutes as needed for chest pain.   Omega-3 Fatty Acids (FISH OIL) 1000 MG CAPS Take 1,000 mg by mouth in the morning.   omeprazole (PRILOSEC) 20 MG capsule Take 20 mg by mouth daily.   sennosides-docusate sodium (SENOKOT-S) 8.6-50 MG tablet Take 1 tablet by mouth  every other day. IN THE MORNING   timolol (TIMOPTIC) 0.5 % ophthalmic solution Place 1 drop into the left eye 2 (two) times daily.   buPROPion (WELLBUTRIN XL) 150 MG 24 hr tablet Take 1 tablet (150 mg total) by mouth daily.   docusate sodium (COLACE) 100 MG capsule Take 100 mg by mouth as needed for mild constipation. (Patient not taking: Reported on 12/16/2022)   miconazole (MICOTIN) 200 MG vaginal suppository Place 200 mg vaginally at bedtime as needed (vaginal itching). (Patient not taking: Reported on 12/16/2022)   No facility-administered encounter medications on file as of 12/16/2022.    Review of Systems:  Review of Systems  Constitutional:  Negative for activity change and appetite change.  HENT: Negative.    Respiratory:  Negative for cough and shortness of breath.   Cardiovascular:  Negative for leg swelling.  Gastrointestinal:  Negative for constipation.  Genitourinary: Negative.   Musculoskeletal:  Positive for gait problem. Negative for arthralgias and myalgias.  Skin: Negative.   Neurological:  Negative for dizziness and weakness.  Psychiatric/Behavioral:  Positive for confusion. Negative for dysphoric mood and sleep disturbance.     Health Maintenance  Topic Date Due   Medicare Annual Wellness (AWV)  Never done   FOOT EXAM  Never done   OPHTHALMOLOGY EXAM  Never done   Diabetic kidney evaluation - Urine ACR  Never done   Hepatitis C Screening  Never done    COLONOSCOPY (Pts 45-49yrs Insurance coverage will need to be confirmed)  Never done   MAMMOGRAM  Never done   Zoster Vaccines- Shingrix (1 of 2) Never done   Pneumonia Vaccine 63+ Years old (1 of 1 - PCV) Never done   COVID-19 Vaccine (3 - 2023-24 season) 05/23/2022   HEMOGLOBIN A1C  07/27/2022   INFLUENZA VACCINE  12/21/2022 (Originally 04/22/2022)   Diabetic kidney evaluation - eGFR measurement  08/24/2023   DTaP/Tdap/Td (2 - Td or Tdap) 07/11/2024   DEXA SCAN  Completed   HPV VACCINES  Aged Out    Physical Exam: Vitals:   12/16/22 1344  BP: 120/68  Pulse: 74  Resp: 17  Temp: 98 F (36.7 C)  TempSrc: Temporal  SpO2: 98%  Height: 5\' 7"  (1.702 m)   Body mass index is 19.11 kg/m. Physical Exam Vitals reviewed.  Constitutional:      Appearance: Normal appearance.  HENT:     Head: Normocephalic.     Nose: Nose normal.     Mouth/Throat:     Mouth: Mucous membranes are moist.     Pharynx: Oropharynx is clear.  Eyes:     Pupils: Pupils are equal, round, and reactive to light.  Cardiovascular:     Rate and Rhythm: Normal rate and regular rhythm.     Pulses: Normal pulses.     Heart sounds: Normal heart sounds. No murmur heard. Pulmonary:     Effort: Pulmonary effort is normal.     Breath sounds: Normal breath sounds.  Abdominal:     General: Abdomen is flat. Bowel sounds are normal.     Palpations: Abdomen is soft.  Musculoskeletal:        General: No swelling.     Cervical back: Neck supple.     Comments: S/p right BKA  Skin:    General: Skin is warm.  Neurological:     General: No focal deficit present.     Mental Status: She is alert and oriented to person, place, and time.  Psychiatric:  Mood and Affect: Mood normal.        Thought Content: Thought content normal.     Labs reviewed: Basic Metabolic Panel: Recent Labs    02/06/22 0612 02/07/22 0222 02/11/22 0000 04/22/22 0000 07/17/22 0000 08/09/22 1226 08/23/22 0000  NA 137 137   < > 136*  143 133* 139  K 4.3 4.4   < > 4.7 4.7 4.5 4.3  CL 101 104   < > 98* 106 97* 104  CO2 27 27   < > 24* 29* 24* 24*  GLUCOSE 277* 248*  --   --   --   --   --   BUN 24* 22   < > 34* 25* 25* 42*  CREATININE 0.79 0.86   < > 0.9 1.0  --  0.9  CALCIUM 9.4 8.5*   < > 9.6 9.2 9.3 9.5  TSH  --   --   --  3.78  --   --   --    < > = values in this interval not displayed.   Liver Function Tests: Recent Labs    12/18/21 0000 04/22/22 0000 07/17/22 0000 08/23/22 0000  AST 14 18 18 19   ALT 9 15 13 18   ALKPHOS 139* 118 65 74  ALBUMIN 3.2*  --  3.7 3.6   No results for input(s): "LIPASE", "AMYLASE" in the last 8760 hours. No results for input(s): "AMMONIA" in the last 8760 hours. CBC: Recent Labs    02/06/22 0612 02/07/22 0222 02/11/22 0000 03/17/22 0000 04/22/22 0000 07/17/22 0000  WBC 7.7 7.6   < > 6.4 5.5 5.2  NEUTROABS  --   --   --   --  3.20  --   HGB 10.2* 8.5*   < > 10.3* 11.2* 10.7*  HCT 31.7* 26.6*   < > 31* 33* 32*  MCV 88.5 87.5  --   --   --   --   PLT 367 325   < > 288 262 266   < > = values in this interval not displayed.   Lipid Panel: Recent Labs    04/22/22 0000  CHOL 173  HDL 81*  LDLCALC 76  TRIG 77   Lab Results  Component Value Date   HGBA1C 12.7 01/24/2022    Procedures since last visit: No results found.  Assessment/Plan 1. Age-related osteoporosis without current pathological fracture I discussed the findings with the patient It does not seem like patient has ever been on bisphosphonate But at this time patient is adamantly refusing to start anything .  I made a copy of the DEXA and give it and it to her to discuss with her endocrinologist Dr. 06/22/22 and [facility]  2. 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 S/P BKA (below knee amputation) unilateral, right (HCC) Mostly uses Wheelchair now  4. Cognitive  impairment This has ben issue Her Last MMSE was 29/30 She has refused to see Neurology     Labs/tests ordered:  * No order type specified * Next appt:  Visit date not found

## 2022-12-21 NOTE — Progress Notes (Signed)
No Show

## 2022-12-29 IMAGING — MR MR FOOT*R* WO/W CM
9 series · 40 of 40 positions shown · IV contrast (gadavist)
Comparison: MRI examination dated September 18, 2021

CLINICAL DATA: Foot swelling, diabetic, osteomyelitis suspected.
Prior osteomyelitis and amputation of the first metatarsal and
phalanges.

EXAM:
MRI OF THE RIGHT FOREFOOT WITHOUT AND WITH CONTRAST
TECHNIQUE: Multiplanar, multisequence MR imaging of the right forefoot was
performed before and after the administration of intravenous
contrast.
CONTRAST:  6mL GADAVIST GADOBUTROL 1 MMOL/ML IV SOLN

[Series 3: T1 · coronal · right · 3.0mm · 0.47mm/px · 5 of 38 slices shown (1 of 2)]
[im 1/38]
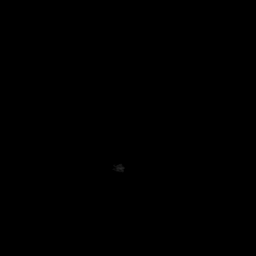
[im 10/38]
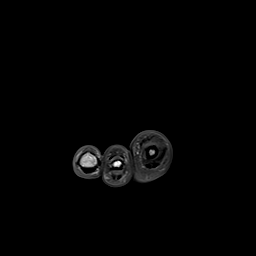
[im 19/38]
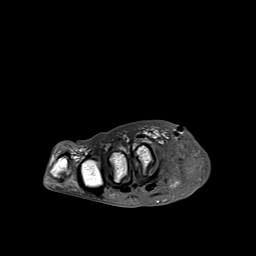
[im 28/38]
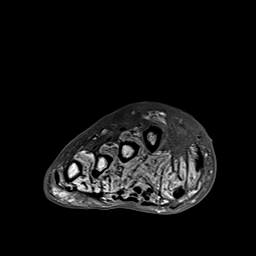
[im 38/38]
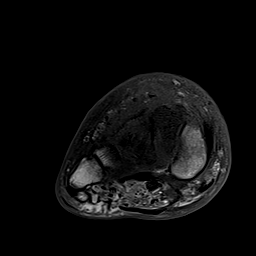

[Series 4: T2 fat-sat · coronal · right · 3.0mm · 0.38mm/px · 6 of 40 slices shown (1 of 2)]
[im 1/40]
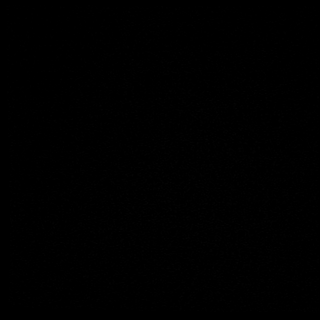
[im 8/40]
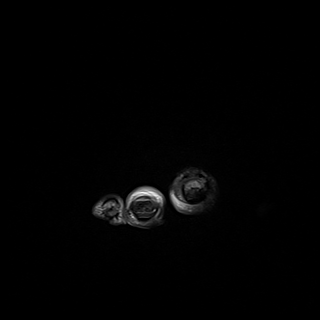
[im 16/40]
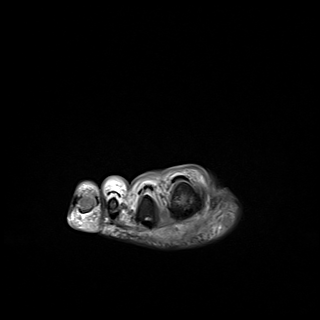
[im 24/40]
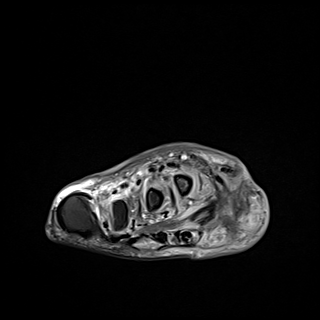
[im 32/40]
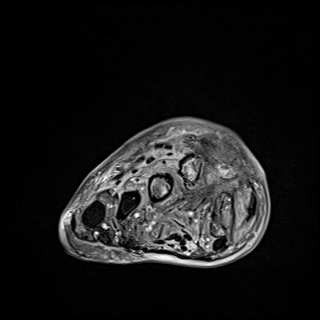
[im 40/40]
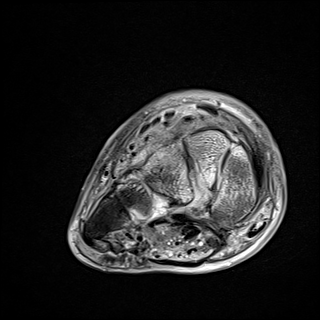

[Series 5: T2 fat-sat · axial · right · 3.0mm · 0.70mm/px · z∈[-114,-47]mm · 3 of 21 slices shown (2 of 2)]
[im 1/21]
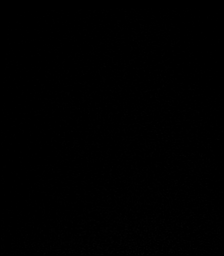
[im 11/21]
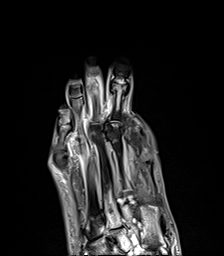
[im 21/21]
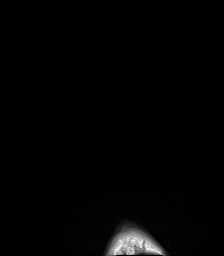

[Series 6: T1 · axial · right · 3.0mm · 0.70mm/px · z∈[-114,-47]mm · 3 of 22 slices shown (2 of 2)]
[im 1/22]
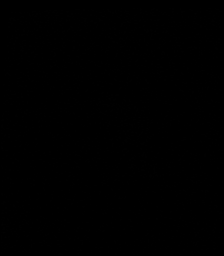
[im 11/22]
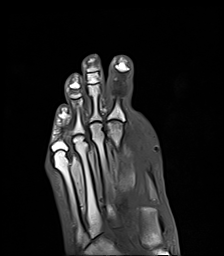
[im 22/22]
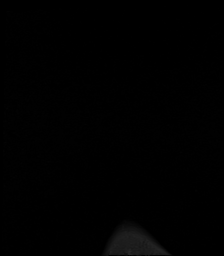

[Series 7: STIR · sagittal · right · 3.0mm · 0.35mm/px · 4 of 27 slices shown]
[im 1/27]
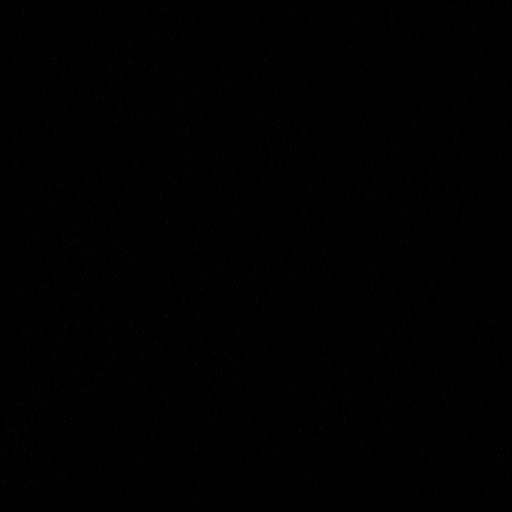
[im 9/27]
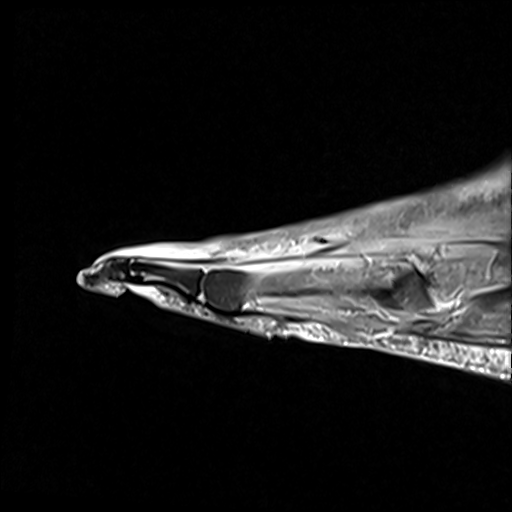
[im 18/27]
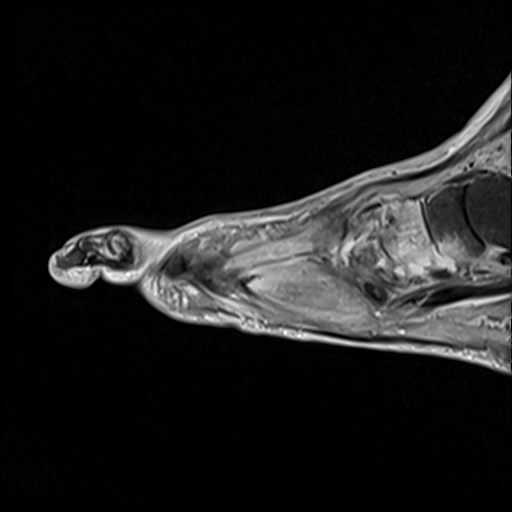
[im 27/27]
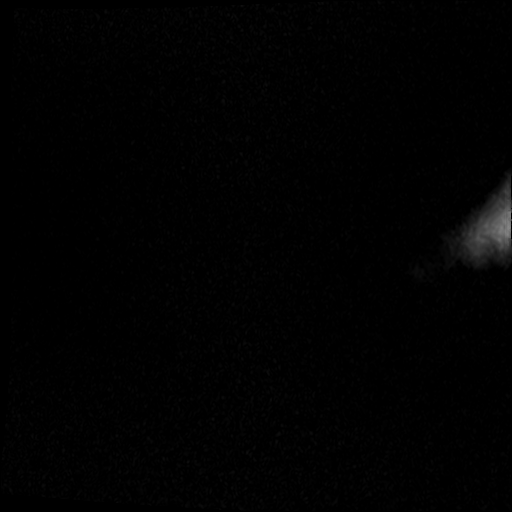

[Series 8: T1 fat-sat · coronal · non-contrast · right · 3.0mm · 0.47mm/px · 6 of 44 slices shown]
[im 1/44]
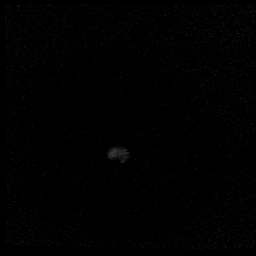
[im 9/44]
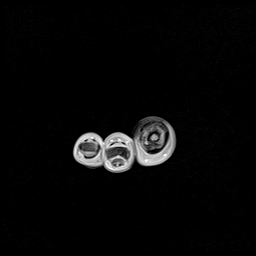
[im 18/44]
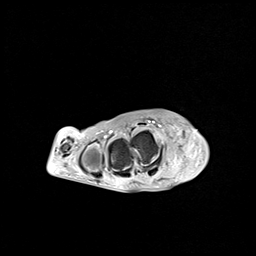
[im 26/44]
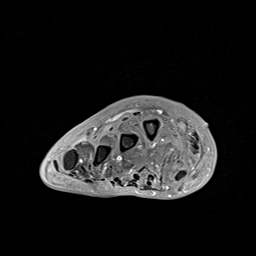
[im 35/44]
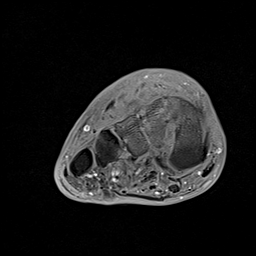
[im 44/44]
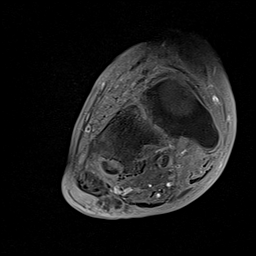

[Series 9: T1 fat-sat post-contrast · coronal · right · 3.0mm · 0.47mm/px · 6 of 45 slices shown (1 of 3)]
[im 1/45]
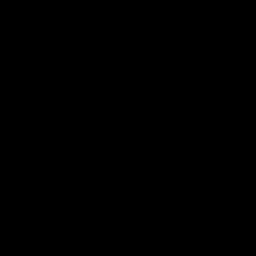
[im 9/45]
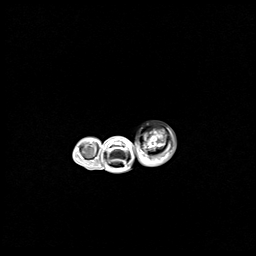
[im 18/45]
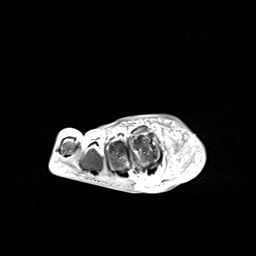
[im 27/45]
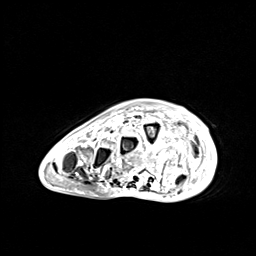
[im 36/45]
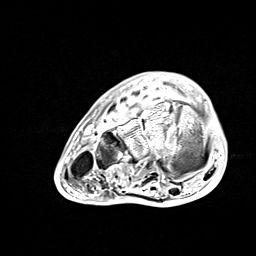
[im 45/45]
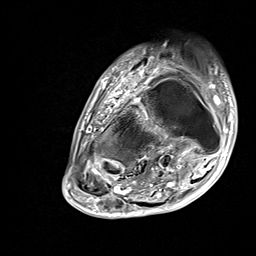

[Series 10: T1 fat-sat post-contrast · sagittal · right · 3.0mm · 0.35mm/px · 4 of 27 slices shown (2 of 3)]
[im 1/27]
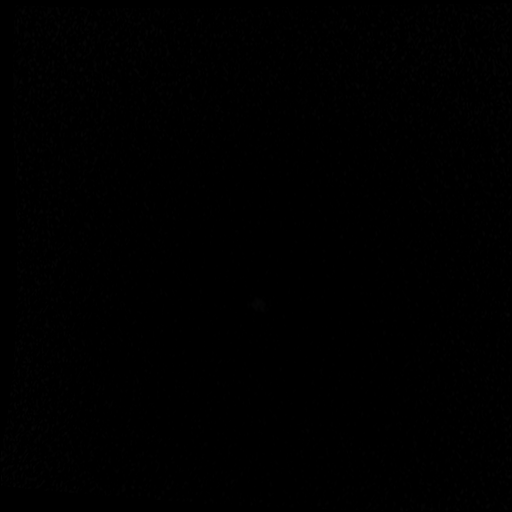
[im 9/27]
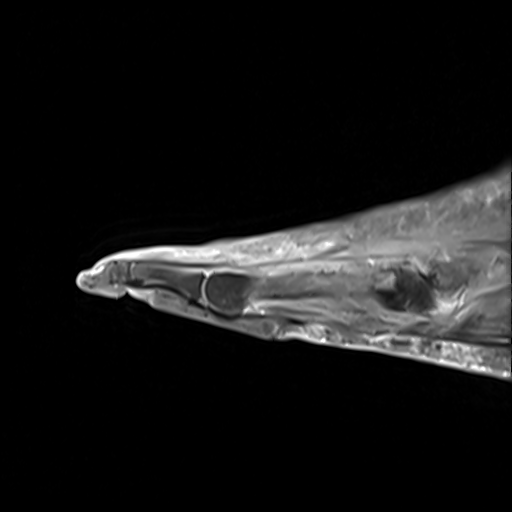
[im 18/27]
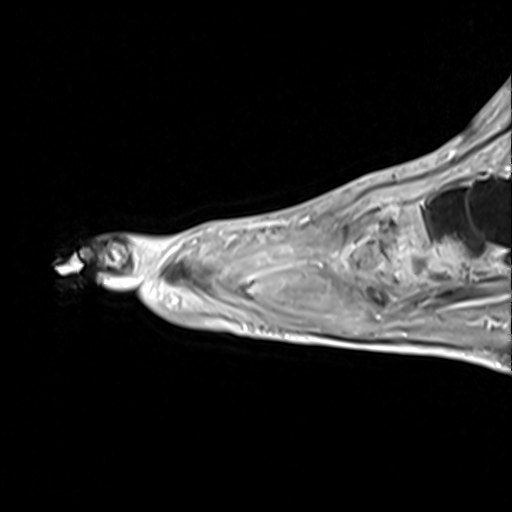
[im 27/27]
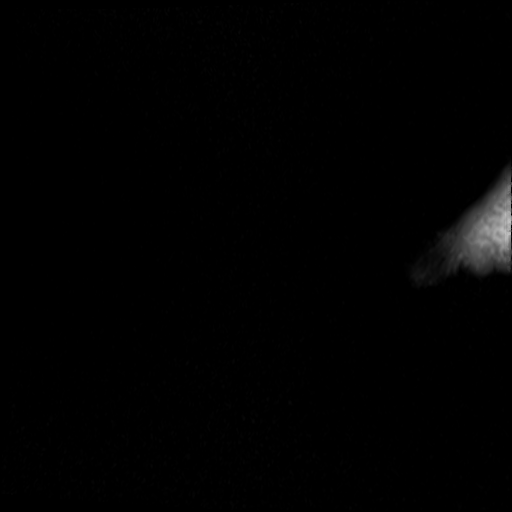

[Series 11: T1 fat-sat post-contrast · axial · right · 3.0mm · 0.56mm/px · z∈[-114,-47]mm · 3 of 22 slices shown (3 of 3)]
[im 1/22]
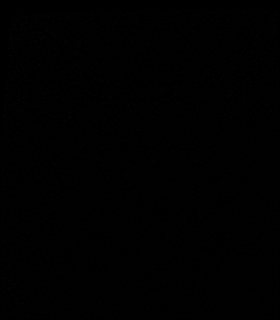
[im 11/22]
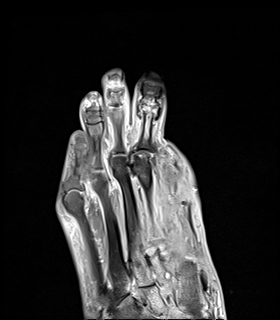
[im 22/22]
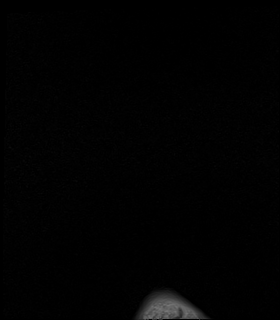

[40 of 40 positions shown; findings below may reference images not displayed]

FINDINGS: Bones/Joint/Cartilage

Postsurgical changes of prior great toe and first metatarsal
amputation. There is marrow edema on STIR and T2 sequences of the
medial, middle and lateral cuneiforms proximal second and third
metatarsals and medial aspect of the fourth metatarsal base with
enhancement on post contrast sequences consistent with
osteomyelitis. There is also mild edema of the cuboid about the
peroneal canal similar to prior examination, which may be secondary
to stress fracture or osteomyelitis.

Ligaments

Collateral ligaments are intact.  Lisfranc ligament is intact.

Muscles and Tendons

Flexor, peroneal and extensor compartment tendons are intact.
Increased signal of the plantar muscles concerning for diabetic
myopathy/myositis. No intramuscular fluid collection or abscess.

Soft tissue
There is marked edema and phlegmonous changes about the prior
amputation site anterior aspect of the medial cuneiform. There is no
drainable fluid collection or abscess on post-contrast sequences.
There are scattered rounded foci of loss signal, which is likely
subcutaneous gas. No soft tissue mass.
IMPRESSION: 1. Bone marrow edema of the medial, middle and lateral cuneiform
bones as well as proximal aspect of the second and third metatarsals
and focal edema of the medial aspect of the fourth metatarsal base,
with enhancement on post contrast sequences consistent with
osteomyelitis.

2. There is also focal marrow edema of the cuboid about the
thickened peroneus longus tendon, which may be secondary to stress
fracture or osteomyelitis.

3. Phlegmonous changes with foci of gas about the prior amputation
site without evidence of well-defined fluid collection or abscess.

## 2023-01-04 LAB — COMPREHENSIVE METABOLIC PANEL
Albumin: 4 (ref 3.5–5.0)
Calcium: 9.3 (ref 8.7–10.7)
eGFR: 77

## 2023-01-04 LAB — HEPATIC FUNCTION PANEL
ALT: 12 U/L (ref 7–35)
AST: 15 (ref 13–35)
Alkaline Phosphatase: 67 (ref 25–125)
Bilirubin, Total: 0.4

## 2023-01-04 LAB — CBC AND DIFFERENTIAL
HCT: 29 — AB (ref 36–46)
Hemoglobin: 9.8 — AB (ref 12.0–16.0)
Platelets: 285 10*3/uL (ref 150–400)
WBC: 5.7

## 2023-01-04 LAB — BASIC METABOLIC PANEL
BUN: 18 (ref 4–21)
CO2: 29 — AB (ref 13–22)
Chloride: 101 (ref 99–108)
Creatinine: 0.8 (ref 0.5–1.1)
Glucose: 240
Potassium: 4.5 mEq/L (ref 3.5–5.1)
Sodium: 136 — AB (ref 137–147)

## 2023-01-04 LAB — CBC: RBC: 3.5 — AB (ref 3.87–5.11)

## 2023-02-09 ENCOUNTER — Encounter: Payer: Self-pay | Admitting: Adult Health

## 2023-02-09 ENCOUNTER — Non-Acute Institutional Stay: Payer: Medicare Other | Admitting: Adult Health

## 2023-02-09 DIAGNOSIS — E1021 Type 1 diabetes mellitus with diabetic nephropathy: Secondary | ICD-10-CM | POA: Diagnosis not present

## 2023-02-09 DIAGNOSIS — L03032 Cellulitis of left toe: Secondary | ICD-10-CM | POA: Diagnosis not present

## 2023-02-09 MED ORDER — DOXYCYCLINE MONOHYDRATE 100 MG PO TABS: 100.0000 mg | ORAL_TABLET | Freq: Two times a day (BID) | ORAL | 0 refills | Status: DC

## 2023-02-09 NOTE — Progress Notes (Signed)
Location:  [facility] Room Number: [phone] Place of Service:  ALF [phone]) Provider:  Tamsen Roers, MD  Patient Care Team: [name], MD as PCP - General (Internal Medicine) [name], MD as Referring Physician (Ophthalmology) Beverely Pace, [name], MD as Referring Physician (Cardiology) Bertram Gala, MD as Referring Physician (Cardiology)  Extended Emergency Contact Information Primary Emergency Contact: [name] Address: [address]          [place], [place] [address] [name] of [place] Home Phone: [phone] Mobile Phone: [phone] Relation: Son Preferred language: English Interpreter needed? No Secondary Emergency Contact: [name],[name] Mobile Phone: [phone] Relation: Relative  Code Status:  Full Code Goals of care: Advanced Directive information    02/09/2023    9:06 AM  Advanced Directives  Does Patient Have a Medical Advance Directive? Yes  Type of Estate agent of [place];Living will  Does patient want to make changes to medical advance directive? No - Patient declined  Copy of Healthcare Power of Attorney in Chart? Yes - validated most recent copy scanned in chart (See row information)     Chief Complaint  Patient presents with   Acute Visit    Patient is being seen for acute toe redness   Immunizations    Patient is due for updated covid, shingles and pneumonia vaccine    Health Maintenance    Discuss the need for heo c screening, foot exam, eye exam, microalbumin, colonoscopy , mammogram , A1c and AWV    HPI:  Pt is a 72 y.o. female seen today for an acute visit for left 3rd toe redness. Ms. Caitlin Sharp is a type 1 diabetic. She reports one week ago her left 3rd toe nail was pulled off by her stockings. Now she has some redness or yellow drainage. She also has a skin tear on the LLE from the foot pedal of her WC which is also red. She does not have a fever  and feels well otherwise.  She has been scratching her left arm and has some bleeding. She reports its from the [place] sensor but the nurse reports it was not put on that arm. She has a hx of a right BKA as well.   A1C 8.2 07/2022  Abd aortogram left leg 10/02/21 On the left: No flow-limiting stenosis appreciated in the common femoral, profunda, superficial femoral, popliteal arteries. Three-vessel runoff to the foot.    Past Medical History:  Diagnosis Date   Abnormal thyroid blood test    Per Records recevied from [facility], Dr.Pearson   Acute deep vein thrombosis of right peroneal vein (HCC) 10/05/2020   RLE DVT   Anxiety    Arthritis    RA   Coronary artery disease    Diabetes type I [facility])    Per [facility] New Patient Packet   Diabetic retinopathy (HCC)    Per [facility] New Patient Packet   DKA (diabetic ketoacidosis) (HCC) 2021   Dr.William Zackowski.  Insulin Pump was out of Insulin   Glaucoma    Per [facility] New Patient Packet   H/O heart bypass surgery 2012   Dr. Excell Seltzer, Per [facility] New Patient Packet   Heart murmur    when I was a teenager   History of blood transfusion    History of kidney stones    passed   Hx of CABG    Per records received form [facility], Dr.Pearson    Left bundle branch block  Mild mitral regurgitation    Per records from [facility], Dr.Pearson    Neuropathy 1976   Per [facility] New Patient Packet   NSTEMI (non-ST elevated myocardial infarction) (HCC) 09/21/2020   NSTEMI/demand ischemic in setting of DKA, patent grafts by 09/24/20 [facility], continue medical therapy   Osteoporosis    T Score -2.7 , Per [facility] New Patient Packet   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 due to underlying condition [facility])    Per Records recevied from [facility], Dr.Pearson   PVD (peripheral vascular disease) (HCC) 10/27/2021   Type 1 diabetes mellitus with hyperglycemia (HCC) 10/28/2021   Past Surgical History:  Procedure Laterality Date   ABDOMINAL  AORTOGRAM W/LOWER EXTREMITY N/A 10/02/2021   Procedure: ABDOMINAL AORTOGRAM W/LOWER EXTREMITY;  Surgeon: Victorino Sparrow, MD;  Location: [facility] INVASIVE CV LAB;  Service: Cardiovascular;  Laterality: N/A;   AMPUTATION Right 04/17/2021   Procedure: RIGHT GREAT TOE AMPUTATION;  Surgeon: [name], MD;  Location: [facility] OR;  Service: Orthopedics;  Laterality: Right;   AMPUTATION Right 07/19/2021   Procedure: RIGHT FOOT 1ST RAY AMPUTATION;  Surgeon: [name], MD;  Location: [facility] OR;  Service: Orthopedics;  Laterality: Right;   AMPUTATION Right 02/06/2022   Procedure: AMPUTATION BELOW KNEE;  Surgeon: Toni Arthurs, MD;  Location: [facility] OR;  Service: Orthopedics;  Laterality: Right;  regional block   CESAREAN SECTION  1970   Dr. Aquilla Hacker, Per [facility] New Patient Packet   CESAREAN SECTION  1973   Dr. Aquilla Hacker, Per [facility] New Patient Packet   CORONARY ARTERY BYPASS GRAFT      Allergies  Allergen Reactions   Penicillins Anaphylaxis    Outpatient Encounter Medications as of 02/09/2023  Medication Sig   acetaminophen (TYLENOL) 500 MG tablet Take 1,000 mg by mouth 2 (two) times daily as needed for moderate pain.   atorvastatin (LIPITOR) 40 MG tablet Take 40 mg by mouth at bedtime.   Biotin w/ Vitamins C & E (HAIR/SKIN/NAILS PO) Take 1 tablet by mouth in the morning.   brimonidine (ALPHAGAN) 0.2 % ophthalmic solution Place 1 drop into the left eye 3 (three) times daily.   calcium carbonate (OSCAL) 1500 (600 Ca) MG TABS tablet Take 600 mg of elemental calcium by mouth daily.   clopidogrel (PLAVIX) 75 MG tablet Take 75 mg by mouth in the morning.   Continuous Blood Gluc Sensor (FREESTYLE LIBRE 14 DAY SENSOR) MISC Inject 1 sensor to the skin every 14 days for continuous glucose monitoring.   cyanocobalamin (VITAMIN B12) 1000 MCG tablet Take 1,000 mcg by mouth daily.   glucosamine-chondroitin 500-400 MG tablet Take 1 tablet by mouth every morning.   Glucose 2 g CHEW Chew 1 tablet by mouth as needed. Give one  tablet for blood sugar < 70, may repeat every 15 mins until blood glucose > 70, may give with juice and other foods.   insulin glargine (LANTUS SOLOSTAR) 100 UNIT/ML Solostar Pen Inject 12 Units into the skin daily. In the morning. PATIENT SELF ADMINISTERS   insulin glargine (LANTUS) 100 UNIT/ML injection Inject 3 Units into the skin daily. 9:00 pm-10:00 pm   insulin lispro (HUMALOG) 100 UNIT/ML KwikPen Inject 5-15 Units into the skin See admin instructions. If blood sugar is 151-200 = 5 units, 201-250 = 8 units, 251-300  = 10 units, 301-350 = 12 units, 351-400 = 14 units, 401 and above =  16.  If blood sugar is greater than 200 inject 5 units at  bedtime, if greater than 500 call on call provider   Insulin Pen Needle (PEN NEEDLES 3/16") 31G X 5 MM MISC 1 Device by Does not apply route 5 (five) times daily. With insulin administration   latanoprost (XALATAN) 0.005 % ophthalmic solution Place 1 drop into both eyes every evening.   lisinopril (ZESTRIL) 2.5 MG tablet Take 2.5 mg by mouth in the morning.   Multiple Vitamins-Minerals (I-VITE PO) Take 1 tablet by mouth in the morning.   nitroGLYCERIN (NITROSTAT) 0.4 MG SL tablet Place 0.4 mg under the tongue every 5 (five) minutes as needed for chest pain.   Omega-3 Fatty Acids (FISH OIL) 1000 MG CAPS Take 1,000 mg by mouth in the morning.   omeprazole (PRILOSEC) 20 MG capsule Take 20 mg by mouth daily.   sennosides-docusate sodium (SENOKOT-S) 8.6-50 MG tablet Take 1 tablet by mouth every other day. IN THE MORNING   timolol (TIMOPTIC) 0.5 % ophthalmic solution Place 1 drop into the left eye 2 (two) times daily.   buPROPion (WELLBUTRIN XL) 150 MG 24 hr tablet Take 1 tablet (150 mg total) by mouth daily.   docusate sodium (COLACE) 100 MG capsule Take 100 mg by mouth as needed for mild constipation. (Patient not taking: Reported on 12/16/2022)   miconazole (MICOTIN) 200 MG vaginal suppository Place 200 mg vaginally at bedtime as needed (vaginal itching).  (Patient not taking: Reported on 12/16/2022)   mupirocin cream (BACTROBAN) 2 % Apply 1 Application topically 2 (two) times daily. (Patient not taking: Reported on 02/09/2023)   No facility-administered encounter medications on file as of 02/09/2023.    Review of Systems  Constitutional:  Negative for activity change, appetite change, chills, diaphoresis, fatigue, fever and unexpected weight change.  HENT:  Negative for congestion.   Respiratory:  Negative for cough, shortness of breath and wheezing.   Cardiovascular:  Positive for leg swelling. Negative for chest pain and palpitations.  Gastrointestinal:  Negative for abdominal distention, abdominal pain, constipation and diarrhea.  Genitourinary:  Negative for difficulty urinating and dysuria.  Musculoskeletal:  Positive for gait problem. Negative for arthralgias, back pain, joint swelling and myalgias.  Skin:  Positive for wound.  Neurological:  Negative for dizziness, tremors, seizures, syncope, facial asymmetry, speech difficulty, weakness, light-headedness, numbness and headaches.  Psychiatric/Behavioral:  Negative for agitation, behavioral problems and confusion.        Memory loss    Immunization History  Administered Date(s) Administered   Influenza, High Dose Seasonal PF 09/01/2005   PFIZER(Purple Top)SARS-COV-2 Vaccination 12/02/2019, 12/26/2019   Tdap 07/11/2014   Pertinent  Health Maintenance Due  Topic Date Due   FOOT EXAM  Never done   OPHTHALMOLOGY EXAM  Never done   COLONOSCOPY (Pts 45-93yrs Insurance coverage will need to be confirmed)  Never done   MAMMOGRAM  Never done   HEMOGLOBIN A1C  07/27/2022   INFLUENZA VACCINE  04/23/2023   DEXA SCAN  Completed      04/21/2022    2:53 PM 07/29/2022    9:15 AM 08/12/2022   11:48 AM 09/30/2022   10:01 AM 12/16/2022    1:45 PM  Fall Risk  Falls in the past year? 0 1 1 0 1  Was there an injury with Fall? 0 1 1 0 1  Fall Risk Category Calculator 0 3 3 0 3  Fall Risk  Category (Retired) Low High High Low   (RETIRED) Patient Fall Risk Level Low fall risk High fall risk High fall risk High fall risk  Patient at Risk for Falls Due to No Fall Risks History of fall(s);Impaired balance/gait;Impaired mobility;Impaired vision History of fall(s);Impaired balance/gait;Impaired mobility;Impaired vision History of fall(s);Impaired balance/gait;Impaired vision;Impaired mobility Impaired mobility;Impaired balance/gait  Fall risk Follow up Falls evaluation completed Falls evaluation completed Falls evaluation completed Falls evaluation completed    Functional Status Survey:    Vitals:   02/09/23 0902  BP: 130/67  Pulse: 76  Resp: 16  Temp: (!) 97.2 F (36.2 C)  TempSrc: Temporal  SpO2: 97%  Weight: 129 lb (58.5 kg)  Height: 5\' 7"  (1.702 m)   Body mass index is 20.2 kg/m. Physical Exam Vitals and nursing note reviewed.  Cardiovascular:     Rate and Rhythm: Normal rate and regular rhythm.     Comments: LLE +pedal pulse +2 Pulmonary:     Effort: Pulmonary effort is normal.     Breath sounds: Normal breath sounds.  Skin:    Comments: Left 3rd toenail removed. Underlying tissue is erythematous with yellow drainage and also surrounding area of the prior toe nail is also red. Also small skin tear to left anterior lower ext pink tissue with surrounding erythema. Area of excoriation to left upper arm with bleeding. No redness or drainage.     Labs reviewed: Recent Labs    04/22/22 0000 07/17/22 0000 08/09/22 1226 08/23/22 0000  NA 136* 143 133* 139  K 4.7 4.7 4.5 4.3  CL 98* 106 97* 104  CO2 24* 29* 24* 24*  BUN 34* 25* 25* 42*  CREATININE 0.9 1.0  --  0.9  CALCIUM 9.6 9.2 9.3 9.5   Recent Labs    04/22/22 0000 07/17/22 0000 08/23/22 0000  AST 18 18 19   ALT 15 13 18   ALKPHOS 118 65 74  ALBUMIN  --  3.7 3.6   Recent Labs    03/17/22 0000 04/22/22 0000 07/17/22 0000  WBC 6.4 5.5 5.2  NEUTROABS  --  3.20  --   HGB 10.3* 11.2* 10.7*  HCT  31* 33* 32*  PLT 288 262 266   Lab Results  Component Value Date   TSH 3.78 04/22/2022   Lab Results  Component Value Date   HGBA1C 12.7 01/24/2022   Lab Results  Component Value Date   CHOL 173 04/22/2022   HDL 81 (A) 04/22/2022   LDLCALC 76 04/22/2022   TRIG 77 04/22/2022    Significant Diagnostic Results in last 30 days:  No results found.  Assessment/Plan  1. Cellulitis of toe of left foot Doxycycline 100 mg bid x 10 days with food Cleanse toe daily with NS, apply non adherent bandage and  monitor daily. Document each assessment in matrix.    2. 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     Family/ staff Communication: resident and nurse   Labs/tests ordered:  A1C Urine micro  Total time :  time greater than 50% of total time spent doing pt counseling and coordination of care

## 2023-02-10 ENCOUNTER — Encounter: Payer: Self-pay | Admitting: Adult Health

## 2023-02-12 LAB — HEMOGLOBIN A1C: Hemoglobin A1C: 9.1

## 2023-02-12 LAB — MICROALBUMIN, URINE: Microalb, Ur: <1.2

## 2023-02-19 ENCOUNTER — Encounter: Payer: Self-pay | Admitting: Adult Health

## 2023-02-19 ENCOUNTER — Non-Acute Institutional Stay: Payer: Medicare Other | Admitting: Adult Health

## 2023-02-19 DIAGNOSIS — L03032 Cellulitis of left toe: Secondary | ICD-10-CM | POA: Diagnosis not present

## 2023-02-19 DIAGNOSIS — E1059 Type 1 diabetes mellitus with other circulatory complications: Secondary | ICD-10-CM

## 2023-02-19 NOTE — Progress Notes (Signed)
Location:   [facility] Room Number: [phone] Place of Service:  [phone]) Provider:  Fletcher Anon, NP  [name], MD  Patient Care Team: [name], MD as PCP - General (Internal Medicine) [name], MD as Referring Physician (Ophthalmology) Beverely Pace, [name], MD as Referring Physician (Cardiology) Bertram Gala, MD as Referring Physician (Cardiology)  Extended Emergency Contact Information Primary Emergency Contact: [name] Address: [address]          [place], [place] [address] [name] of [place] Home Phone: [phone] Mobile Phone: [phone] Relation: Son Preferred language: English Interpreter needed? No Secondary Emergency Contact: [name],[name] Mobile Phone: [phone] Relation: Relative  Code Status: FULL CODE  Goals of care: Advanced Directive information    02/19/2023    9:28 AM  Advanced Directives  Does Patient Have a Medical Advance Directive? Yes  Type of Advance Directive Healthcare Power of Attorney  Does patient want to make changes to medical advance directive? No - Patient declined  Copy of Healthcare Power of Attorney in Chart? Yes - validated most recent copy scanned in chart (See row information)     Chief Complaint  Patient presents with   Acute Visit    Blood sugar    HPI:  Pt is a 72 y.o. female seen today for an acute visit for concern for low blood sugar. She is Type 1 DM followed by endocrinology on meal coverage and Lantus. She has continuous glucose monitoring connected to her nurse's cell phone. When the readings are low they go in and make sure she is alert and provide carbohydrate. She has been refusing Lantus at night due to low numbers.  For instance one morning at 0315 she was 115.  She was not symptomatic but ate some skittles and drank juice. Blood sugar later was 231. The lowest reading I found was 89.  This tends to occur at night but can occur during the  day. Other times her CBG can be as high as 350+.   A1C 9.1 02/12/23, last A1C 8.2 Urine micro <1.2  She was seen on 5/20 and treated with doxycycline for 10 days  for left 3rd toe cellulitis and some redness at the left lower shin area. Symptoms are improving. No fever or drainage.   Past Medical History:  Diagnosis Date   Abnormal thyroid blood test    Per Records recevied from [facility], Dr.Pearson   Acute deep vein thrombosis of right peroneal vein (HCC) 10/05/2020   RLE DVT   Anxiety    Arthritis    RA   Coronary artery disease    Diabetes type I [facility])    Per [facility] New Patient Packet   Diabetic retinopathy (HCC)    Per [facility] New Patient Packet   DKA (diabetic ketoacidosis) (HCC) 2021   Dr.William Zackowski.  Insulin Pump was out of Insulin   Glaucoma    Per [facility] New Patient Packet   H/O heart bypass surgery 2012   Dr. Excell Seltzer, Per [facility] New Patient Packet   Heart murmur    when I was a teenager   History of blood transfusion    History of kidney stones    passed   Hx of CABG    Per records received form [facility], Dr.Pearson    Left bundle branch block    Mild mitral regurgitation    Per records from [facility], Dr.Pearson    Neuropathy 1976   Per [facility] New Patient Packet  NSTEMI (non-ST elevated myocardial infarction) (HCC) 09/21/2020   NSTEMI/demand ischemic in setting of DKA, patent grafts by 09/24/20 [facility], continue medical therapy   Osteoporosis    T Score -2.7 , Per [facility] New Patient Packet   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 due to underlying condition [facility])    Per Records recevied from [facility], Dr.Pearson   PVD (peripheral vascular disease) (HCC) 10/27/2021   Type 1 diabetes mellitus with hyperglycemia (HCC) 10/28/2021   Past Surgical History:  Procedure Laterality Date   ABDOMINAL AORTOGRAM W/LOWER EXTREMITY N/A 10/02/2021   Procedure: ABDOMINAL AORTOGRAM W/LOWER EXTREMITY;  Surgeon: Victorino Sparrow, MD;  Location: [facility]  INVASIVE CV LAB;  Service: Cardiovascular;  Laterality: N/A;   AMPUTATION Right 04/17/2021   Procedure: RIGHT GREAT TOE AMPUTATION;  Surgeon: [name], MD;  Location: [facility] OR;  Service: Orthopedics;  Laterality: Right;   AMPUTATION Right 07/19/2021   Procedure: RIGHT FOOT 1ST RAY AMPUTATION;  Surgeon: [name], MD;  Location: [facility] OR;  Service: Orthopedics;  Laterality: Right;   AMPUTATION Right 02/06/2022   Procedure: AMPUTATION BELOW KNEE;  Surgeon: Toni Arthurs, MD;  Location: [facility] OR;  Service: Orthopedics;  Laterality: Right;  regional block   CESAREAN SECTION  1970   Dr. Aquilla Hacker, Per [facility] New Patient Packet   CESAREAN SECTION  1973   Dr. Aquilla Hacker, Per [facility] New Patient Packet   CORONARY ARTERY BYPASS GRAFT      Allergies  Allergen Reactions   Penicillins Anaphylaxis    Allergies as of 02/19/2023       Reactions   Penicillins Anaphylaxis        Medication List        Accurate as of Feb 19, 2023  9:47 AM. If you have any questions, ask your nurse or doctor.          STOP taking these medications    buPROPion 150 MG 24 hr tablet Commonly known as: Wellbutrin XL Stopped by: Fletcher Anon, NP   doxycycline 100 MG tablet Commonly known as: ADOXA Stopped by: Fletcher Anon, NP   Pen Needles 3/16" 31G X 5 MM Misc Stopped by: Fletcher Anon, NP       TAKE these medications    acetaminophen 500 MG tablet Commonly known as: TYLENOL Take 1,000 mg by mouth 2 (two) times daily as needed for moderate pain.   atorvastatin 40 MG tablet Commonly known as: LIPITOR Take 40 mg by mouth at bedtime.   brimonidine 0.2 % ophthalmic solution Commonly known as: ALPHAGAN Place 1 drop into the left eye 3 (three) times daily.   calcium carbonate 1500 (600 Ca) MG Tabs tablet Commonly known as: OSCAL Take 600 mg of elemental calcium by mouth daily.   clopidogrel 75 MG tablet Commonly known as: PLAVIX Take 75 mg by mouth in the morning.   cyanocobalamin 1000 MCG  tablet Commonly known as: VITAMIN B12 Take 1,000 mcg by mouth daily.   Fish Oil 1000 MG Caps Take 1,000 mg by mouth in the morning.   FreeStyle Libre 14 Day Sensor Misc Inject 1 sensor to the skin every 14 days for continuous glucose monitoring.   glucosamine-chondroitin 500-400 MG tablet Take 1 tablet by mouth every morning.   glucose 4 GM chewable tablet Chew 1 tablet by mouth as needed. Give one tablet for blood sugar < 70, may repeat every 15 mins until blood glucose > 70, may give with juice and other foods.  HAIR/SKIN/NAILS PO Take 1 tablet by mouth in the morning.   I-VITE PO Take 1 tablet by mouth in the morning.   insulin lispro 100 UNIT/ML KwikPen Commonly known as: HUMALOG Inject 5-15 Units into the skin See admin instructions. If blood sugar is 151-200 = 5 units, 201-250 = 8 units, 251-300  = 10 units, 301-350 = 12 units, 351-400 = 14 units, 401 and above =  16.  If blood sugar is greater than 200 inject 5 units at bedtime, if greater than 500 call on call provider   Lantus SoloStar 100 UNIT/ML Solostar Pen Generic drug: insulin glargine Inject 12 Units into the skin daily. In the morning. PATIENT SELF ADMINISTERS   insulin glargine 100 UNIT/ML injection Commonly known as: LANTUS Inject 5 Units into the skin daily. 9:00 pm-10:00 pm   latanoprost 0.005 % ophthalmic solution Commonly known as: XALATAN Place 1 drop into both eyes every evening.   lisinopril 2.5 MG tablet Commonly known as: ZESTRIL Take 2.5 mg by mouth in the morning.   nitroGLYCERIN 0.4 MG SL tablet Commonly known as: NITROSTAT Place 0.4 mg under the tongue every 5 (five) minutes as needed for chest pain.   omeprazole 20 MG capsule Commonly known as: PRILOSEC Take 20 mg by mouth daily.   sennosides-docusate sodium 8.6-50 MG tablet Commonly known as: SENOKOT-S Take 1 tablet by mouth every other day. IN THE MORNING   timolol 0.5 % ophthalmic solution Commonly known as: TIMOPTIC Place 1  drop into the left eye 2 (two) times daily.        Review of Systems  Constitutional:  Negative for activity change, appetite change, chills, diaphoresis, fatigue, fever and unexpected weight change.  HENT:  Negative for congestion.   Respiratory:  Negative for cough, shortness of breath and wheezing.   Cardiovascular:  Positive for leg swelling. Negative for chest pain and palpitations.  Gastrointestinal:  Negative for abdominal distention, abdominal pain, constipation and diarrhea.  Genitourinary:  Negative for difficulty urinating and dysuria.  Musculoskeletal:  Positive for gait problem. Negative for arthralgias, back pain, joint swelling and myalgias.  Skin:  Positive for wound.  Neurological:  Negative for dizziness, tremors, seizures, syncope, facial asymmetry, speech difficulty, weakness, light-headedness, numbness and headaches.  Psychiatric/Behavioral:  Positive for confusion. Negative for agitation and behavioral problems.     Immunization History  Administered Date(s) Administered   Influenza, High Dose Seasonal PF 09/01/2005   PFIZER(Purple Top)SARS-COV-2 Vaccination 12/02/2019, 12/26/2019   Tdap 07/11/2014   Pertinent  Health Maintenance Due  Topic Date Due   OPHTHALMOLOGY EXAM  Never done   MAMMOGRAM  Never done   INFLUENZA VACCINE  04/23/2023   HEMOGLOBIN A1C  08/15/2023   FOOT EXAM  02/09/2024   Colonoscopy  02/14/2030   DEXA SCAN  Completed      04/21/2022    2:53 PM 07/29/2022    9:15 AM 08/12/2022   11:48 AM 09/30/2022   10:01 AM 12/16/2022    1:45 PM  Fall Risk  Falls in the past year? 0 1 1 0 1  Was there an injury with Fall? 0 1 1 0 1  Fall Risk Category Calculator 0 3 3 0 3  Fall Risk Category (Retired) Low High High Low   (RETIRED) Patient Fall Risk Level Low fall risk High fall risk High fall risk High fall risk   Patient at Risk for Falls Due to No Fall Risks History of fall(s);Impaired balance/gait;Impaired mobility;Impaired vision History of  fall(s);Impaired balance/gait;Impaired mobility;Impaired vision  History of fall(s);Impaired balance/gait;Impaired vision;Impaired mobility Impaired mobility;Impaired balance/gait  Fall risk Follow up Falls evaluation completed Falls evaluation completed Falls evaluation completed Falls evaluation completed    Functional Status Survey:    Vitals:   02/19/23 0925  BP: (!) 156/79  Pulse: 85  Resp: 20  Temp: (!) 97.3 F (36.3 C)  SpO2: 97%  Weight: 129 lb (58.5 kg)  Height: 5\' 7"  (1.702 m)   Body mass index is 20.2 kg/m. Physical Exam Constitutional:      Appearance: Normal appearance.  Musculoskeletal:     Left lower leg: Edema (+2) present.  Skin:    General: Skin is warm and dry.     Comments: Left 3rd toe nail area with mild yellow/green discoloration which is dried up and improved. NO redness or swelling.  Left shin area some closed skin tears with mild surrounding erythema  Neurological:     Mental Status: She is alert.     Labs reviewed: Recent Labs    07/17/22 0000 08/09/22 1226 08/23/22 0000 01/04/23 0000  NA 143 133* 139 136*  K 4.7 4.5 4.3 4.5  CL 106 97* 104 101  CO2 29* 24* 24* 29*  BUN 25* 25* 42* 18  CREATININE 1.0  --  0.9 0.8  CALCIUM 9.2 9.3 9.5 9.3   Recent Labs    07/17/22 0000 08/23/22 0000 01/04/23 0000  AST 18 19 15   ALT 13 18 12   ALKPHOS 65 74 67  ALBUMIN 3.7 3.6 4.0   Recent Labs    04/22/22 0000 07/17/22 0000 01/04/23 0000  WBC 5.5 5.2 5.7  NEUTROABS 3.20  --   --   HGB 11.2* 10.7* 9.8*  HCT 33* 32* 29*  PLT 262 266 285   Lab Results  Component Value Date   TSH 3.78 04/22/2022   Lab Results  Component Value Date   HGBA1C 9.1 02/12/2023   Lab Results  Component Value Date   CHOL 173 04/22/2022   HDL 81 (A) 04/22/2022   LDLCALC 76 04/22/2022   TRIG 77 04/22/2022    Significant Diagnostic Results in last 30 days:  No results found.  Assessment/Plan  1. Type 1 diabetes mellitus with other circulatory  complication (HCC) She is refusing night time lantus due to borderline lows.  Wll d/c evening lantus, increased am dose from 12 to 15.   F/U with endocrinology in Sept Recommend diabetic eye exam. She had been refusing but now is willing to go.    2. Cellulitis of toe of left foot Improved Completed 10 day course of doxycycline.    Family/ staff Communication: resident   Labs/tests ordered:   NA  She refused shingrix and pna vaccines.

## 2023-02-23 ENCOUNTER — Encounter: Payer: Self-pay | Admitting: Internal Medicine

## 2023-02-23 ENCOUNTER — Non-Acute Institutional Stay: Payer: Medicare Other | Admitting: Internal Medicine

## 2023-02-23 DIAGNOSIS — Z89511 Acquired absence of right leg below knee: Secondary | ICD-10-CM | POA: Diagnosis not present

## 2023-02-23 DIAGNOSIS — M810 Age-related osteoporosis without current pathological fracture: Secondary | ICD-10-CM

## 2023-02-23 DIAGNOSIS — R4189 Other symptoms and signs involving cognitive functions and awareness: Secondary | ICD-10-CM

## 2023-02-23 DIAGNOSIS — E1059 Type 1 diabetes mellitus with other circulatory complications: Secondary | ICD-10-CM

## 2023-02-23 DIAGNOSIS — F331 Major depressive disorder, recurrent, moderate: Secondary | ICD-10-CM

## 2023-02-23 NOTE — Progress Notes (Unsigned)
Location:  [facility] Room Number: [phone] Place of Service:  ALF [phone]) Provider:  [name], MD   [name], MD  Patient Care Team: [name], MD as PCP - General (Internal Medicine) [name], MD as Referring Physician (Ophthalmology) Beverely Pace, [name], MD as Referring Physician (Cardiology) Bertram Gala, MD as Referring Physician (Cardiology)  Extended Emergency Contact Information Primary Emergency Contact: [name] Address: [address]          [place], [place] [address] [name] of [place] Home Phone: [phone] Mobile Phone: [phone] Relation: Son Preferred language: English Interpreter needed? No Secondary Emergency Contact: [name],[name] Mobile Phone: [phone] Relation: Relative  Code Status:  Full Code Goals of care: Advanced Directive information    02/23/2023   10:51 AM  Advanced Directives  Does Patient Have a Medical Advance Directive? Yes  Type of Advance Directive Healthcare Power of Attorney  Does patient want to make changes to medical advance directive? No - Patient declined  Copy of Healthcare Power of Attorney in Chart? Yes - validated most recent copy scanned in chart (See row information)     Chief Complaint  Patient presents with   Acute Visit    Patient is being seen for a acute visit     HPI:  Pt is a 72 y.o. female seen today for an acute visit for Family Meeting and Low CBGS  Lives in [facility] [place]  Patient H/o Type 1 Diabetes For past 50 years. Has been on Pump but now Long Acting Insulin and Novolog now CAD S/o CABG in 2012  Glaucoma with Poor Vision Mild Cognitive impairment Underwent BKA by Dr Victorino Dike on 02/06/22 For Chronic Osteomyelitis   Osteoporosis Right Forearm Radius -3.2 Refused Biphoaphanates  Low CBGS Noticed recently Anywhere from 90-115 Usually respond to Cookie and Juice Now refusing to take he insulin as she thinks Nurses don't  know what they are doing and giving her more insulin Her A1C is 9.1 up from 8.2 She did have Right Toe Cellulitis treated with Doxycyline Her Evening Lantus was discontinued and Morning lantus was increased by 2 units  Her son  was also in the room today.  He is visiting from [place] They have a lot of complains with the care they getting at [facility].  They were upset that we have not been able to coordinate with Dr. Katrinka Blazing who is her endocrinologist and works in [facility].  Also Ms. Plotner thinks that she should go back to her insulin pump rather than sliding scale.  They think that the nurses can help her.  Nurses have noticed worsening cognition with patient.  She gets very upset very easily Also is getting more incontinent both urine and stool and needing more help. I had made a referral to neurology and recommended antidepressant.  Patient has refused for both  She repeats herself and gets very upset easily     Past Medical History:  Diagnosis Date   Abnormal thyroid blood test    Per Records recevied from [facility], Dr.Pearson   Acute deep vein thrombosis of right peroneal vein (HCC) 10/05/2020   RLE DVT   Anxiety    Arthritis    RA   Coronary artery disease    Diabetes type I [facility])    Per [facility] New Patient Packet   Diabetic retinopathy (HCC)    Per [facility] New Patient Packet   DKA (diabetic ketoacidosis) (HCC) 2021   Dr.William Zackowski.  Insulin  Pump was out of Insulin   Glaucoma    Per [facility] New Patient Packet   H/O heart bypass surgery 2012   Dr. Excell Seltzer, Per [facility] New Patient Packet   Heart murmur    when I was a teenager   History of blood transfusion    History of kidney stones    passed   Hx of CABG    Per records received form [facility], Dr.Pearson    Left bundle branch block    Mild mitral regurgitation    Per records from [facility], Dr.Pearson    Neuropathy 1976   Per [facility] New Patient Packet   NSTEMI (non-ST elevated myocardial infarction) (HCC) 09/21/2020    NSTEMI/demand ischemic in setting of DKA, patent grafts by 09/24/20 [facility], continue medical therapy   Osteoporosis    T Score -2.7 , Per [facility] New Patient Packet   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 due to underlying condition [facility])    Per Records recevied from [facility], Dr.Pearson   PVD (peripheral vascular disease) (HCC) 10/27/2021   Type 1 diabetes mellitus with hyperglycemia (HCC) 10/28/2021   Past Surgical History:  Procedure Laterality Date   ABDOMINAL AORTOGRAM W/LOWER EXTREMITY N/A 10/02/2021   Procedure: ABDOMINAL AORTOGRAM W/LOWER EXTREMITY;  Surgeon: Victorino Sparrow, MD;  Location[facility] INVASIVE CV LAB;  Service: Cardiovascular;  Laterality: N/A;   AMPUTATION Right 04/17/2021   Procedure: RIGHT GREAT TOE AMPUTATION;  Surgeon: [name], MD;  Location: [facility] OR;  Service: Orthopedics;  Laterality: Right;   AMPUTATION Right 07/19/2021   Procedure: RIGHT FOOT 1ST RAY AMPUTATION;  Surgeon: [name], MD;  Location: [facility] OR;  Service: Orthopedics;  Laterality: Right;   AMPUTATION Right 02/06/2022   Procedure: AMPUTATION BELOW KNEE;  Surgeon: Toni Arthurs, MD;  Location: [facility] OR;  Service: Orthopedics;  Laterality: Right;  regional block   CESAREAN SECTION  1970   Dr. Aquilla Hacker, Per [facility] New Patient Packet   CESAREAN SECTION  1973   Dr. Aquilla Hacker, Per [facility] New Patient Packet   CORONARY ARTERY BYPASS GRAFT      Allergies  Allergen Reactions   Penicillins Anaphylaxis    Outpatient Encounter Medications as of 02/23/2023  Medication Sig   acetaminophen (TYLENOL) 500 MG tablet Take 1,000 mg by mouth 2 (two) times daily as needed for moderate pain.   atorvastatin (LIPITOR) 40 MG tablet Take 40 mg by mouth at bedtime.   Biotin w/ Vitamins C & E (HAIR/SKIN/NAILS PO) Take 1 tablet by mouth in the morning.   brimonidine (ALPHAGAN) 0.2 % ophthalmic solution Place 1 drop into the left eye 3 (three) times daily.   calcium carbonate (OSCAL) 1500 (600  Ca) MG TABS tablet Take 600 mg of elemental calcium by mouth daily.   clopidogrel (PLAVIX) 75 MG tablet Take 75 mg by mouth in the morning.   Continuous Blood Gluc Sensor (FREESTYLE LIBRE 14 DAY SENSOR) MISC Inject 1 sensor to the skin every 14 days for continuous glucose monitoring.   cyanocobalamin (VITAMIN B12) 1000 MCG tablet Take 1,000 mcg by mouth daily.   glucosamine-chondroitin 500-400 MG tablet Take 1 tablet by mouth every morning.   glucose 4 GM chewable tablet Chew 1 tablet by mouth as needed. Give one tablet for blood sugar < 70, may repeat every 15 mins until blood glucose > 70, may give with juice and other foods.   insulin glargine (LANTUS SOLOSTAR) 100 UNIT/ML Solostar Pen Inject 15 Units into  the skin daily. In the morning. PATIENT SELF ADMINISTERS   insulin lispro (HUMALOG) 100 UNIT/ML KwikPen Inject 5-15 Units into the skin See admin instructions. If blood sugar is 151-200 = 5 units, 201-250 = 8 units, 251-300  = 10 units, 301-350 = 12 units, 351-400 = 14 units, 401 and above =  16.  If blood sugar is greater than 200 inject 5 units at bedtime, if greater than 500 call on call provider   latanoprost (XALATAN) 0.005 % ophthalmic solution Place 1 drop into both eyes every evening.   lisinopril (ZESTRIL) 2.5 MG tablet Take 2.5 mg by mouth in the morning.   Multiple Vitamins-Minerals (I-VITE PO) Take 1 tablet by mouth in the morning.   nitroGLYCERIN (NITROSTAT) 0.4 MG SL tablet Place 0.4 mg under the tongue every 5 (five) minutes as needed for chest pain.   Omega-3 Fatty Acids (FISH OIL) 1000 MG CAPS Take 1,000 mg by mouth in the morning.   omeprazole (PRILOSEC) 20 MG capsule Take 20 mg by mouth daily.   sennosides-docusate sodium (SENOKOT-S) 8.6-50 MG tablet Take 1 tablet by mouth every other day. IN THE MORNING   timolol (TIMOPTIC) 0.5 % ophthalmic solution Place 1 drop into the left eye 2 (two) times daily.   No facility-administered encounter medications on file as of 02/23/2023.     Review of Systems  Constitutional:  Negative for activity change and appetite change.  HENT: Negative.    Respiratory:  Negative for cough and shortness of breath.   Cardiovascular:  Negative for leg swelling.  Gastrointestinal:  Negative for constipation.  Genitourinary: Negative.   Musculoskeletal:  Positive for gait problem. Negative for arthralgias and myalgias.  Skin: Negative.   Neurological:  Negative for dizziness and weakness.  Psychiatric/Behavioral:  Positive for dysphoric mood. Negative for sleep disturbance.     Immunization History  Administered Date(s) Administered   Influenza, High Dose Seasonal PF 09/01/2005   PFIZER(Purple Top)SARS-COV-2 Vaccination 12/02/2019, 12/26/2019   Tdap 07/11/2014   Pertinent  Health Maintenance Due  Topic Date Due   OPHTHALMOLOGY EXAM  Never done   MAMMOGRAM  Never done   INFLUENZA VACCINE  04/23/2023   HEMOGLOBIN A1C  08/15/2023   FOOT EXAM  02/09/2024   Colonoscopy  02/14/2030   DEXA SCAN  Completed      04/21/2022    2:53 PM 07/29/2022    9:15 AM 08/12/2022   11:48 AM 09/30/2022   10:01 AM 12/16/2022    1:45 PM  Fall Risk  Falls in the past year? 0 1 1 0 1  Was there an injury with Fall? 0 1 1 0 1  Fall Risk Category Calculator 0 3 3 0 3  Fall Risk Category (Retired) Low High High Low   (RETIRED) Patient Fall Risk Level Low fall risk High fall risk High fall risk High fall risk   Patient at Risk for Falls Due to No Fall Risks History of fall(s);Impaired balance/gait;Impaired mobility;Impaired vision History of fall(s);Impaired balance/gait;Impaired mobility;Impaired vision History of fall(s);Impaired balance/gait;Impaired vision;Impaired mobility Impaired mobility;Impaired balance/gait  Fall risk Follow up Falls evaluation completed Falls evaluation completed Falls evaluation completed Falls evaluation completed    Functional Status Survey:    Vitals:   02/23/23 1045  BP: (!) 146/75  Pulse: 75  Resp: 20  Temp: 98 F  (36.7 C)  TempSrc: Temporal  SpO2: 99%  Weight: 129 lb (58.5 kg)  Height: 5\' 7"  (1.702 m)   Body mass index is 20.2 kg/m. Physical Exam Vitals  reviewed.  Constitutional:      Appearance: Normal appearance.  HENT:     Head: Normocephalic.     Nose: Nose normal.     Mouth/Throat:     Mouth: Mucous membranes are moist.     Pharynx: Oropharynx is clear.  Eyes:     Pupils: Pupils are equal, round, and reactive to light.  Cardiovascular:     Rate and Rhythm: Normal rate and regular rhythm.     Pulses: Normal pulses.     Heart sounds: Normal heart sounds. No murmur heard. Pulmonary:     Effort: Pulmonary effort is normal.     Breath sounds: Normal breath sounds.  Abdominal:     General: Abdomen is flat. Bowel sounds are normal.     Palpations: Abdomen is soft.  Musculoskeletal:        General: No swelling.     Cervical back: Neck supple.     Comments: S/p Right BKA  Skin:    General: Skin is warm.  Neurological:     Mental Status: She is alert and oriented to person, place, and time.  Psychiatric:        Mood and Affect: Mood normal.        Thought Content: Thought content normal.     Labs reviewed: Recent Labs    07/17/22 0000 08/09/22 1226 08/23/22 0000 01/04/23 0000  NA 143 133* 139 136*  K 4.7 4.5 4.3 4.5  CL 106 97* 104 101  CO2 29* 24* 24* 29*  BUN 25* 25* 42* 18  CREATININE 1.0  --  0.9 0.8  CALCIUM 9.2 9.3 9.5 9.3   Recent Labs    07/17/22 0000 08/23/22 0000 01/04/23 0000  AST 18 19 15   ALT 13 18 12   ALKPHOS 65 74 67  ALBUMIN 3.7 3.6 4.0   Recent Labs    04/22/22 0000 07/17/22 0000 01/04/23 0000  WBC 5.5 5.2 5.7  NEUTROABS 3.20  --   --   HGB 11.2* 10.7* 9.8*  HCT 33* 32* 29*  PLT 262 266 285   Lab Results  Component Value Date   TSH 3.78 04/22/2022   Lab Results  Component Value Date   HGBA1C 9.1 02/12/2023   Lab Results  Component Value Date   CHOL 173 04/22/2022   HDL 81 (A) 04/22/2022   LDLCALC 76 04/22/2022   TRIG 77  04/22/2022    Significant Diagnostic Results in last 30 days:  No results found.  Assessment/Plan 1. Type 1 diabetes mellitus with other circulatory complication (HCC) A1C slightly Higher I am going to reduce her morning Lantus to her previous dose and also reduce Sliding  scale by 2 units .  The nurses are going to call Dr. Katrinka Blazing so that she can see him with the son. I discussed  her complaints with DON  in [facility].   Since we cannot help her with the insulin pump and she is having cognition issues they are not going to agree for the nurses to manage it. .  I have relayed that information to the son.  They need to discuss this with her endocrinologist that [facility] would not be able to manage her Insulin pump. I have also suggested to them about finding PCP in [facility] who can better Coordinate with Dr Katrinka Blazing as she has not been happy with our Communications  2. Age-related osteoporosis without current pathological fracture Worsening Radius T score -3.2 Femur T score was not done Given copy so they  can discuss with Dr Katrinka Blazing  3. S/P BKA (below knee amputation) unilateral, right (HCC) Mostly stays in her Wheelchair  4. Cognitive impairment Noticed by Nurses Needing more help I thats been d/w the sons Possible move to SNF level  5. Moderate episode of recurrent major depressive disorder (HCC) Refuses Antidepressant    Family/ staff Communication: Son Total time spent in this patient care encounter was  60_  minutes; greater than 50% of the visit spent counseling patient and staff, reviewing records , Labs and coordinating care for problems addressed at this encounter.    Labs/tests ordered:

## 2023-04-13 LAB — HM DIABETES EYE EXAM

## 2023-06-12 ENCOUNTER — Encounter: Payer: Self-pay | Admitting: Adult Health

## 2023-06-12 ENCOUNTER — Non-Acute Institutional Stay: Payer: Medicare Other | Admitting: Adult Health

## 2023-06-12 DIAGNOSIS — F331 Major depressive disorder, recurrent, moderate: Secondary | ICD-10-CM | POA: Diagnosis not present

## 2023-06-12 MED ORDER — ESCITALOPRAM OXALATE 5 MG PO TABS: 5.0000 mg | ORAL_TABLET | Freq: Every day | ORAL | Status: AC

## 2023-06-12 NOTE — Progress Notes (Signed)
Location:  Medical illustrator of Service:  ALF (13) Provider:   Peggye Sharp, ANP [facility] [phone]   Caitlin Gammon, MD  Patient Care Team: Caitlin Gammon, MD as PCP - General (Internal Medicine) Caitlin Skeens, MD as Referring Physician (Ophthalmology) Caitlin Sharp, Caitlin Coho, MD as Referring Physician (Cardiology) Caitlin Gala, MD as Referring Physician (Cardiology)  Extended Emergency Contact Information Primary Emergency Contact: Caitlin Sharp Address: [address]          [place], [place] [address] [name] of [place] Home Phone: [phone] Mobile Phone: [phone] Relation: Son Preferred language: English Interpreter needed? No Secondary Emergency Contact: Caitlin Sharp Mobile Phone: [phone] Relation: Relative  Code Status:  Full Goals of care: Advanced Directive information    02/23/2023   10:51 AM  Advanced Directives  Does Patient Have a Medical Advance Directive? Yes  Type of Advance Directive Healthcare Power of Attorney  Does patient want to make changes to medical advance directive? No - Patient declined  Copy of Healthcare Power of Attorney in Chart? Yes - validated most recent copy scanned in chart (See row information)     Chief Complaint  Patient presents with   Acute Visit    depression    HPI:  Pt is a 72 y.o. female seen today for an acute visit for depression.   Caitlin Sharp resides in [facility] assisted living [facility]. I was asked to see her by the nursing supervisor. The staff received a text message from her son requesting that she be put on an antidepressant. She is receiving counseling as well. She is feeling sadness regarding life circumstances. She has lost part of her right leg, her memory is worsening, vision is worsening, and she needs more assistance with ADLs. She also is going to have to move to another facility. She distrusts the staff. At times refuses insulin. Can be  resistance to medical guidance.  She saw endocrinology on 9/19.  The increased her SSI slightly due to A1C 8.5%.  She has a CGM with transmission via phone.    Past Medical History:  Diagnosis Date   Abnormal thyroid blood test    Per Records recevied from [facility], CaitlinPearson   Acute deep vein thrombosis of right peroneal vein (HCC) 10/05/2020   RLE DVT   Anxiety    Arthritis    RA   Coronary artery disease    Diabetes type I [facility])    Per [facility] New Patient Packet   Diabetic retinopathy (HCC)    Per [facility] New Patient Packet   DKA (diabetic ketoacidosis) (HCC) 2021   Caitlin Sharp.  Insulin Pump was out of Insulin   Glaucoma    Per [facility] New Patient Packet   H/O heart bypass surgery 2012   Caitlin Sharp, Per [facility] New Patient Packet   Heart murmur    when I was a teenager   History of blood transfusion    History of kidney stones    passed   Hx of CABG    Per records received form [facility], CaitlinPearson    Left bundle branch block    Mild mitral regurgitation    Per records from [facility], CaitlinPearson    Neuropathy 1976   Per [facility] New Patient Packet   NSTEMI (non-ST elevated myocardial infarction) (HCC) 09/21/2020   NSTEMI/demand ischemic in setting of DKA, patent grafts by 09/24/20 LHC, continue medical therapy   Osteoporosis    T Score -2.7 , Per  [facility] New Patient Packet   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 due to underlying condition [facility])    Per Records recevied from [facility], CaitlinPearson   PVD (peripheral vascular disease) (HCC) 10/27/2021   Type 1 diabetes mellitus with hyperglycemia (HCC) 10/28/2021   Past Surgical History:  Procedure Laterality Date   ABDOMINAL AORTOGRAM W/LOWER EXTREMITY N/A 10/02/2021   Procedure: ABDOMINAL AORTOGRAM W/LOWER EXTREMITY;  Surgeon: Caitlin Sparrow, MD;  Location: [facility] INVASIVE CV LAB;  Service: Cardiovascular;  Laterality: N/A;   AMPUTATION Right 04/17/2021   Procedure: RIGHT GREAT TOE  AMPUTATION;  Surgeon: [name], MD;  Location: [facility] OR;  Service: Orthopedics;  Laterality: Right;   AMPUTATION Right 07/19/2021   Procedure: RIGHT FOOT 1ST RAY AMPUTATION;  Surgeon: [name], MD;  Location: [facility] OR;  Service: Orthopedics;  Laterality: Right;   AMPUTATION Right 02/06/2022   Procedure: AMPUTATION BELOW KNEE;  Surgeon: Caitlin Arthurs, MD;  [facility] OR;  Service: Orthopedics;  Laterality: Right;  regional block   CESAREAN SECTION  1970   Dr. Aquilla Sharp, Per [facility] New Patient Packet   CESAREAN SECTION  1973   Dr. Aquilla Sharp, Per [facility] New Patient Packet   CORONARY ARTERY BYPASS GRAFT      Allergies  Allergen Reactions   Penicillins Anaphylaxis    Outpatient Encounter Medications as of 06/12/2023  Medication Sig   acetaminophen (TYLENOL) 500 MG tablet Take 1,000 mg by mouth 2 (two) times daily as needed for moderate pain.   atorvastatin (LIPITOR) 40 MG tablet Take 40 mg by mouth at bedtime.   Biotin w/ Vitamins C & E (HAIR/SKIN/NAILS PO) Take 1 tablet by mouth in the morning.   calcium carbonate (OSCAL) 1500 (600 Ca) MG TABS tablet Take 600 mg of elemental calcium by mouth daily.   clopidogrel (PLAVIX) 75 MG tablet Take 75 mg by mouth in the morning.   Continuous Blood Gluc Sensor (FREESTYLE LIBRE 14 DAY SENSOR) MISC Inject 1 sensor to the skin every 14 days for continuous glucose monitoring.   cyanocobalamin (VITAMIN B12) 1000 MCG tablet Take 1,000 mcg by mouth daily.   glucosamine-chondroitin 500-400 MG tablet Take 1 tablet by mouth every morning.   glucose 4 GM chewable tablet Chew 1 tablet by mouth as needed. Give one tablet for blood sugar < 70, may repeat every 15 mins until blood glucose > 70, may give with juice and other foods.   insulin glargine (LANTUS SOLOSTAR) 100 UNIT/ML Solostar Pen Inject 13 Units into the skin daily. In the morning. PATIENT SELF ADMINISTERS   latanoprost (XALATAN) 0.005 % ophthalmic solution Place 1 drop into both eyes every evening.    lisinopril (ZESTRIL) 2.5 MG tablet Take 2.5 mg by mouth in the morning.   Multiple Vitamins-Minerals (I-VITE PO) Take 1 tablet by mouth in the morning.   nitroGLYCERIN (NITROSTAT) 0.4 MG SL tablet Place 0.4 mg under the tongue every 5 (five) minutes as needed for chest pain.   Omega-3 Fatty Acids (FISH OIL) 1000 MG CAPS Take 1,000 mg by mouth in the morning.   omeprazole (PRILOSEC) 20 MG capsule Take 20 mg by mouth daily.   sennosides-docusate sodium (SENOKOT-S) 8.6-50 MG tablet Take 1 tablet by mouth every other day. IN THE MORNING   timolol (TIMOPTIC) 0.5 % ophthalmic solution Place 1 drop into the left eye 2 (two) times daily.   escitalopram (LEXAPRO) 5 MG tablet Take 1 tablet (5 mg total) by mouth daily.   insulin  lispro (HUMALOG) 100 UNIT/ML KwikPen Inject 5-15 Units into the skin See admin instructions. If blood sugar is 151-200 = 5 units, 201-250 = 8 units, 251-300  = 10 units, 301-350 = 12 units, 351-400 = 14 units, 401 and above =  16.  If blood sugar is greater than 200 inject 5 units at bedtime, if greater than 500 call on call provider   [DISCONTINUED] brimonidine (ALPHAGAN) 0.2 % ophthalmic solution Place 1 drop into the left eye 3 (three) times daily.   No facility-administered encounter medications on file as of 06/12/2023.    Review of Systems  Constitutional:  Negative for activity change, appetite change, chills, diaphoresis, fatigue and fever.  Psychiatric/Behavioral:  Positive for behavioral problems and dysphoric mood. Negative for agitation, confusion, decreased concentration, hallucinations, self-injury, sleep disturbance and suicidal ideas. The patient is not nervous/anxious and is not hyperactive.     Immunization History  Administered Date(s) Administered   Influenza, High Dose Seasonal PF 09/01/2005   PFIZER(Purple Top)SARS-COV-2 Vaccination 12/02/2019, 12/26/2019   Tdap 07/11/2014   Pertinent  Health Maintenance Due  Topic Date Due   OPHTHALMOLOGY EXAM  Never  done   MAMMOGRAM  Never done   INFLUENZA VACCINE  04/23/2023   HEMOGLOBIN A1C  08/15/2023   FOOT EXAM  02/09/2024   Colonoscopy  02/14/2030   DEXA SCAN  Completed      04/21/2022    2:53 PM 07/29/2022    9:15 AM 08/12/2022   11:48 AM 09/30/2022   10:01 AM 12/16/2022    1:45 PM  Fall Risk  Falls in the past year? 0 1 1 0 1  Was there an injury with Fall? 0 1 1 0 1  Fall Risk Category Calculator 0 3 3 0 3  Fall Risk Category (Retired) Low High High Low   (RETIRED) Patient Fall Risk Level Low fall risk High fall risk High fall risk High fall risk   Patient at Risk for Falls Due to No Fall Risks History of fall(s);Impaired balance/gait;Impaired mobility;Impaired vision History of fall(s);Impaired balance/gait;Impaired mobility;Impaired vision History of fall(s);Impaired balance/gait;Impaired vision;Impaired mobility Impaired mobility;Impaired balance/gait  Fall risk Follow up Falls evaluation completed Falls evaluation completed Falls evaluation completed Falls evaluation completed    Functional Status Survey:    Vitals:   06/12/23 1100  Weight: 130 lb 9.6 oz (59.2 kg)   Body mass index is 20.45 kg/m. Physical Exam Vitals and nursing note reviewed.  Constitutional:      Appearance: Normal appearance.  Neurological:     Mental Status: She is alert.  Psychiatric:     Comments: Flat and depressed affect     Labs reviewed: Recent Labs    07/17/22 0000 08/09/22 1226 08/23/22 0000 01/04/23 0000  NA 143 133* 139 136*  K 4.7 4.5 4.3 4.5  CL 106 97* 104 101  CO2 29* 24* 24* 29*  BUN 25* 25* 42* 18  CREATININE 1.0  --  0.9 0.8  CALCIUM 9.2 9.3 9.5 9.3   Recent Labs    07/17/22 0000 08/23/22 0000 01/04/23 0000  AST 18 19 15   ALT 13 18 12   ALKPHOS 65 74 67  ALBUMIN 3.7 3.6 4.0   Recent Labs    07/17/22 0000 01/04/23 0000  WBC 5.2 5.7  HGB 10.7* 9.8*  HCT 32* 29*  PLT 266 285   Lab Results  Component Value Date   TSH 3.78 04/22/2022   Lab Results   Component Value Date   HGBA1C 9.1 02/12/2023  Lab Results  Component Value Date   CHOL 173 04/22/2022   HDL 81 (A) 04/22/2022   LDLCALC 76 04/22/2022   TRIG 77 04/22/2022    Significant Diagnostic Results in last 30 days:  No results found.  Assessment/Plan 1. Moderate episode of recurrent major depressive disorder (HCC) Pt is experiencing depression related to life circumstances and has agreed to try Lexapro. We discussed that this med can cause low sodium (class s/e of SSRI's) and therefore her sodium will need to be checked next week. Also this med can cause you to feel jittery the first few days. She was made aware of these side effects and continued to agree to take the medication.  - escitalopram (LEXAPRO) 5 MG tablet; Take 1 tablet (5 mg total) by mouth daily.  Also I recommend she f/u in the clinic in one month for dose increase if needed and to see how she is doing. In the event she has moved to a new facility we will cancel the apt.   Family/ staff Communication: discussed with Caitlin Sharp and her nurse Crystal.   Labs/tests ordered:  CBC BMP 9/26   Total time :  time greater than 50% of total time spent doing pt counseling and coordination of care

## 2023-07-07 ENCOUNTER — Encounter: Payer: Self-pay | Admitting: Internal Medicine

## 2023-07-07 ENCOUNTER — Non-Acute Institutional Stay: Payer: Medicare Other | Admitting: Internal Medicine

## 2023-07-07 VITALS — BP 132/66 | HR 71 | Temp 97.6°F | Resp 17 | Ht 67.0 in | Wt 130.0 lb

## 2023-07-07 DIAGNOSIS — E1059 Type 1 diabetes mellitus with other circulatory complications: Secondary | ICD-10-CM | POA: Diagnosis not present

## 2023-07-07 DIAGNOSIS — F331 Major depressive disorder, recurrent, moderate: Secondary | ICD-10-CM

## 2023-07-07 DIAGNOSIS — Z89511 Acquired absence of right leg below knee: Secondary | ICD-10-CM | POA: Diagnosis not present

## 2023-07-07 DIAGNOSIS — M810 Age-related osteoporosis without current pathological fracture: Secondary | ICD-10-CM

## 2023-07-07 DIAGNOSIS — E785 Hyperlipidemia, unspecified: Secondary | ICD-10-CM

## 2023-07-07 DIAGNOSIS — R4189 Other symptoms and signs involving cognitive functions and awareness: Secondary | ICD-10-CM | POA: Diagnosis not present

## 2023-07-09 NOTE — Progress Notes (Signed)
Location:  [facility] [employer] features editor of Service:  Clinic (12)  Provider:   Code Status:  Goals of Care:     07/07/2023    3:16 PM  Advanced Directives  Does Patient Have a Medical Advance Directive? Yes  Type of Estate agent of [place];Living will;Out of facility DNR (pink MOST or yellow form)  Does patient want to make changes to medical advance directive? No - Patient declined  Copy of Healthcare Power of Attorney in Chart? Yes - validated most recent copy scanned in chart (See row information)     Chief Complaint  Patient presents with   Medical Management of Chronic Issues    Patient is being seen for a one week follow up    HPI: Patient is a 72 y.o. female seen today for medical management of chronic diseases.    [facility]ives in [facility] [place]   Patient H/o Type 1 Diabetes For past 50 years. Has been on Pump but now Long Acting Insulin and Novolog now CAD S/o CABG in 2012  Glaucoma with Poor Vision Mild Cognitive impairment Underwent BKA by Dr Victorino Dike on 02/06/22 For Chronic Osteomyelitis   Osteoporosis Right Forearm Radius -3.2 Refused Biphosphatase  Her Sugars are mainly managed by Dr Katrinka Blazing in [facility] A1C was 8.5 He changed her Sliding scale but now she is having some Low sugars and he had adjusted it back  She was started on lexapro last visit  She says she has not seen any difference in her mood Does nto want me to change the dose Feels she is doing well Her son is moving back from [place] and has brought her house and moving her back there in few days Worsening cognition noticed by staff but doing well with her ADLS Able to do her transfers and Walk with her walker with her Prosthesis Does itch sometimes  Dry Skin    Past Medical History:  Diagnosis Date   Abnormal thyroid blood test    Per [facility] recevied [facility], Dr.Pearson   Acute deep vein thrombosis of right peroneal vein (HCC) 10/05/2020   RLE DVT    Anxiety    Arthritis    RA   Coronary artery disease    Diabetes type I [facility])    Per [facility] New Patient Packet   Diabetic retinopathy (HCC)    Per [facility] New Patient Packet   DKA (diabetic ketoacidosis) (HCC) 2021   Dr.William Zackowski.  Insulin Pump was out of Insulin   Glaucoma    Per [facility] New Patient Packet   H/O heart bypass surgery 2012   Dr. Excell Seltzer, Per [facility] New Patient Packet   Heart murmur    when I was a teenager   History of blood transfusion    History of kidney stones    passed   Hx of CABG    Per records received form [facility], Dr.Pearson    Left bundle branch block    Mild mitral regurgitation    Per records from [facility], Dr.Pearson    Neuropathy 1976   Per [facility] New Patient Packet   NSTEMI (non-ST elevated myocardial infarction) (HCC) 09/21/2020   NSTEMI/demand ischemic in setting of DKA, patent grafts by 09/24/20 LHC, continue medical therapy   Osteoporosis    T Score -2.7 , Per [facility] New Patient Packet   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 due to underlying condition [facility])    Per Records recevied  from [facility], Dr.Pearson   PVD (peripheral vascular disease) (HCC) 10/27/2021   Type 1 diabetes mellitus with hyperglycemia (HCC) 10/28/2021    Past Surgical History:  Procedure Laterality Date   ABDOMINAL AORTOGRAM W/LOWER EXTREMITY N/A 10/02/2021   Procedure: ABDOMINAL AORTOGRAM W/LOWER EXTREMITY;  Surgeon: Victorino Sparrow, MD;  Location: [facility] INVASIVE CV LAB;  Service: Cardiovascular;  Laterality: N/A;   AMPUTATION Right 04/17/2021   Procedure: RIGHT GREAT TOE AMPUTATION;  Surgeon: [name], MD;  Location: [facility] OR;  Service: Orthopedics;  Laterality: Right;   AMPUTATION Right 07/19/2021   Procedure: RIGHT FOOT 1ST RAY AMPUTATION;  Surgeon: [name], MD;  Location: [facility] OR;  Service: Orthopedics;  Laterality: Right;   AMPUTATION Right 02/06/2022   Procedure: AMPUTATION BELOW KNEE;  Surgeon: Toni Arthurs, MD;  [facility] OR;  Service: Orthopedics;  Laterality: Right;  regional block   CESAREAN SECTION  1970   Dr. Aquilla Hacker, Per [facility] New Patient Packet   CESAREAN SECTION  1973   Dr. Aquilla Hacker, Per [facility] New Patient Packet   CORONARY ARTERY BYPASS GRAFT      Allergies  Allergen Reactions   Penicillins Anaphylaxis    Outpatient Encounter Medications as of 07/07/2023  Medication Sig   acetaminophen (TYLENOL) 500 MG tablet Take 1,000 mg by mouth 2 (two) times daily as needed for moderate pain.   atorvastatin (LIPITOR) 40 MG tablet Take 40 mg by mouth at bedtime.   Biotin w/ Vitamins C & E (HAIR/SKIN/NAILS PO) Take 1 tablet by mouth in the morning.   calcium carbonate (OSCAL) 1500 (600 Ca) MG TABS tablet Take 600 mg of elemental calcium by mouth daily.   clopidogrel (PLAVIX) 75 MG tablet Take 75 mg by mouth in the morning.   Continuous Blood Gluc Sensor (FREESTYLE LIBRE 14 DAY SENSOR) MISC Inject 1 sensor to the skin every 14 days for continuous glucose monitoring.   cyanocobalamin (VITAMIN B12) 1000 MCG tablet Take 1,000 mcg by mouth daily.   escitalopram (LEXAPRO) 5 MG tablet Take 1 tablet (5 mg total) by mouth daily.   glucosamine-chondroitin 500-400 MG tablet Take 1 tablet by mouth every morning.   glucose 4 GM chewable tablet Chew 1 tablet by mouth as needed. Give one tablet for blood sugar < 70, may repeat every 15 mins until blood glucose > 70, may give with juice and other foods.   insulin glargine (LANTUS SOLOSTAR) 100 UNIT/ML Solostar Pen Inject 13 Units into the skin daily. In the morning. PATIENT SELF ADMINISTERS   insulin lispro (HUMALOG) 100 UNIT/ML KwikPen Inject 5-15 Units into the skin See admin instructions. If blood sugar is 0-80= 0 units,  81-120= 3 units, 121-150 = 4 units, 151-200= 6 units, 201-250 = 7 units, 251-300 = 8 units, 301-350= 9 units, 351-400= 10 units, 401-500= 12 units, >500 give 14 units.  May given 5 units every day prn if SSI not effective.   latanoprost (XALATAN) 0.005 %  ophthalmic solution Place 1 drop into both eyes every evening.   lisinopril (ZESTRIL) 2.5 MG tablet Take 2.5 mg by mouth in the morning.   Multiple Vitamins-Minerals (I-VITE PO) Take 1 tablet by mouth in the morning.   nitroGLYCERIN (NITROSTAT) 0.4 MG SL tablet Place 0.4 mg under the tongue every 5 (five) minutes as needed for chest pain.   Omega-3 Fatty Acids (FISH OIL) 1000 MG CAPS Take 1,000 mg by mouth in the morning.   omeprazole (PRILOSEC) 20 MG capsule Take 20 mg by mouth  daily.   sennosides-docusate sodium (SENOKOT-S) 8.6-50 MG tablet Take 1 tablet by mouth every other day. IN THE MORNING   timolol (TIMOPTIC) 0.5 % ophthalmic solution Place 1 drop into the left eye 2 (two) times daily.   No facility-administered encounter medications on file as of 07/07/2023.    Review of Systems:  Review of Systems  Constitutional:  Negative for activity change and appetite change.  HENT: Negative.    Respiratory:  Negative for cough and shortness of breath.   Cardiovascular:  Negative for leg swelling.  Gastrointestinal:  Negative for constipation.  Genitourinary: Negative.   Musculoskeletal:  Negative for arthralgias, gait problem and myalgias.  Skin: Negative.   Neurological:  Negative for dizziness and weakness.  Psychiatric/Behavioral:  Negative for confusion, dysphoric mood and sleep disturbance.     Health Maintenance  Topic Date Due   Medicare Annual Wellness (AWV)  Never done   OPHTHALMOLOGY EXAM  Never done   Diabetic kidney evaluation - Urine ACR  Never done   MAMMOGRAM  Never done   Zoster Vaccines- Shingrix (1 of 2) Never done   INFLUENZA VACCINE  04/23/2023   COVID-19 Vaccine (3 - 2023-24 season) 05/24/2023   Pneumonia Vaccine 73+ Years old (1 of 1 - PCV) 02/19/2024 (Originally 10/02/2014)   Hepatitis C Screening  02/19/2024 (Originally 10/03/1968)   HEMOGLOBIN A1C  08/15/2023   Diabetic kidney evaluation - eGFR measurement  01/04/2024   FOOT EXAM  02/09/2024    DTaP/Tdap/Td (2 - Td or Tdap) 07/11/2024   Colonoscopy  02/14/2030   DEXA SCAN  Completed   HPV VACCINES  Aged Out    Physical Exam: Vitals:   07/07/23 1517  BP: 132/66  Pulse: 71  Resp: 17  Temp: 97.6 F (36.4 C)  TempSrc: Temporal  SpO2: 96%  Weight: 130 lb (59 kg)  Height: 5\' 7"  (1.702 m)   Body mass index is 20.36 kg/m. Physical Exam Vitals reviewed.  Constitutional:      Appearance: Normal appearance.  HENT:     Head: Normocephalic.     Nose: Nose normal.     Mouth/Throat:     Mouth: Mucous membranes are moist.     Pharynx: Oropharynx is clear.  Eyes:     Pupils: Pupils are equal, round, and reactive to light.  Cardiovascular:     Rate and Rhythm: Normal rate and regular rhythm.     Pulses: Normal pulses.     Heart sounds: Normal heart sounds. No murmur heard. Pulmonary:     Effort: Pulmonary effort is normal.     Breath sounds: Normal breath sounds.  Abdominal:     General: Abdomen is flat. Bowel sounds are normal.     Palpations: Abdomen is soft.  Musculoskeletal:        General: No swelling.     Cervical back: Neck supple.  Skin:    General: Skin is warm.  Neurological:     General: No focal deficit present.     Mental Status: She is alert and oriented to person, place, and time.  Psychiatric:        Mood and Affect: Mood normal.        Thought Content: Thought content normal.     Labs reviewed: Basic Metabolic Panel: Recent Labs    07/17/22 0000 08/09/22 1226 08/23/22 0000 01/04/23 0000  NA 143 133* 139 136*  K 4.7 4.5 4.3 4.5  CL 106 97* 104 101  CO2 29* 24* 24* 29*  BUN 25*  25* 42* 18  CREATININE 1.0  --  0.9 0.8  CALCIUM 9.2 9.3 9.5 9.3   Liver Function Tests: Recent Labs    07/17/22 0000 08/23/22 0000 01/04/23 0000  AST 18 19 15   ALT 13 18 12   ALKPHOS 65 74 67  ALBUMIN 3.7 3.6 4.0   No results for input(s): "LIPASE", "AMYLASE" in the last 8760 hours. No results for input(s): "AMMONIA" in the last 8760  hours. CBC: Recent Labs    07/17/22 0000 01/04/23 0000  WBC 5.2 5.7  HGB 10.7* 9.8*  HCT 32* 29*  PLT 266 285   Lipid Panel: No results for input(s): "CHOL", "HDL", "LDLCALC", "TRIG", "CHOLHDL", "LDLDIRECT" in the last 8760 hours. Lab Results  Component Value Date   HGBA1C 9.1 02/12/2023    Procedures since last visit: No results found.  Assessment/Plan 1. Type 1 diabetes mellitus with other circulatory complication [facility]) Managed by Dr Katrinka Blazing in [facility] Has Libre Lantus and Sliding scale Insulin  2. Moderate episode of recurrent major depressive disorder (HCC) Doing well with low dose of Lexapro   3. S/P BKA (below knee amputation) unilateral, right [facility]) Doing well with Wheelchair  Walks with Prosthesis and Walker  4. Cognitive impairment Refused further work up  5. Hyperlipidemia, unspecified hyperlipidemia type On statin  6. Age-related osteoporosis without current pathological fracture Worsening Radius T score -3.2 Femur T score was not done Given copy so they can discuss with Dr Walden Field treatment 7 Discharge planning Leaving [facility] Going home with son who will help with hiring caregivers Will follow with PCP in [facility] [place] and Dr Katrinka Blazing for her sugars   Labs/tests ordered:   Next appt:  10/12/2023

## 2023-07-14 ENCOUNTER — Other Ambulatory Visit: Payer: Self-pay

## 2023-07-14 NOTE — Telephone Encounter (Signed)
No fill history showing, will send to [name], MD to review and approve

## 2023-07-23 ENCOUNTER — Telehealth: Payer: Self-pay

## 2023-07-23 NOTE — Telephone Encounter (Signed)
Left message for family to return call

## 2023-07-23 NOTE — Telephone Encounter (Signed)
Patient Son Caitlin Sharp would like a call at [phone]. Patient would like to discuss his mother medication request for Humalog and Lantus be sent back to Dr. Corwin Levins that is her Endocrinologist.

## 2023-07-23 NOTE — Telephone Encounter (Signed)
I don't know how to do that but I have removed myself as her PCP He has to call Pharmacy to ask them to do that

## 2023-08-14 ENCOUNTER — Other Ambulatory Visit: Payer: Self-pay

## 2023-08-14 DIAGNOSIS — F331 Major depressive disorder, recurrent, moderate: Secondary | ICD-10-CM

## 2023-08-18 ENCOUNTER — Other Ambulatory Visit: Payer: Self-pay

## 2023-08-18 ENCOUNTER — Encounter: Payer: Self-pay | Admitting: Adult Health

## 2023-08-18 ENCOUNTER — Telehealth: Payer: Self-pay | Admitting: Internal Medicine

## 2023-08-18 ENCOUNTER — Other Ambulatory Visit: Payer: Self-pay | Admitting: Adult Health

## 2023-08-18 NOTE — Telephone Encounter (Signed)
She was going to find another PCP even when she was at [facility] prior to discharge. She also has an endocrinologist. I could not find the order to add it into epic. Brooke if you want to call in 3 months of the needles needed to use with the [facility] pen that is ok and ensure she has follow up with a new PCP.

## 2023-08-18 NOTE — Telephone Encounter (Signed)
Attempted to call and speak with patient about needles. LVM in regards to needles due to them not being on medication list. Also making sure that the patient is still a patient of Dr. Chales Abrahams due to moving from facility

## 2023-08-18 NOTE — Telephone Encounter (Signed)
Patient's daughter-in-law called requesting refill of the insulin needles (0.25 x 5 mm). These are the needles that attach to the pen that Dr. Chales Abrahams originally prescribed.

## 2023-09-01 ENCOUNTER — Telehealth: Payer: Self-pay

## 2023-09-01 DIAGNOSIS — F331 Major depressive disorder, recurrent, moderate: Secondary | ICD-10-CM

## 2023-09-01 NOTE — Telephone Encounter (Signed)
Incoming refill request recevied from [facility] for   1.) Escitalopram,   2.) Timolol, usually rx'ed by eye doctors. I will send to [name], MD to review after speaking with patient   Left message on voicemail for patient to return call when available  to confirm request as we have not sent any recent rx's to [facility]

## 2023-09-02 NOTE — Telephone Encounter (Signed)
Left message on voicemail for patient to return call when available   

## 2023-10-12 ENCOUNTER — Encounter: Payer: Medicare Other | Admitting: Adult Health

## 2023-10-13 ENCOUNTER — Encounter: Payer: Medicare Other | Admitting: Internal Medicine

## 2023-10-14 NOTE — Telephone Encounter (Signed)
It appears patient is no longer under our care

## 2023-11-13 ENCOUNTER — Other Ambulatory Visit: Payer: Self-pay

## 2023-11-13 ENCOUNTER — Emergency Department (HOSPITAL_BASED_OUTPATIENT_CLINIC_OR_DEPARTMENT_OTHER): Payer: Medicare Other

## 2023-11-13 ENCOUNTER — Emergency Department (HOSPITAL_BASED_OUTPATIENT_CLINIC_OR_DEPARTMENT_OTHER)
Admission: EM | Admit: 2023-11-13 | Discharge: 2023-11-13 | Disposition: A | Discharge status: Home / Self Care | Payer: Medicare Other | Attending: Emergency Medicine | Admitting: Emergency Medicine

## 2023-11-13 DIAGNOSIS — L97529 Non-pressure chronic ulcer of other part of left foot with unspecified severity: Secondary | ICD-10-CM | POA: Diagnosis not present

## 2023-11-13 DIAGNOSIS — E10621 Type 1 diabetes mellitus with foot ulcer: Secondary | ICD-10-CM | POA: Diagnosis not present

## 2023-11-13 DIAGNOSIS — Z794 Long term (current) use of insulin: Secondary | ICD-10-CM | POA: Insufficient documentation

## 2023-11-13 DIAGNOSIS — L89629 Pressure ulcer of left heel, unspecified stage: Secondary | ICD-10-CM | POA: Diagnosis present

## 2023-11-13 DIAGNOSIS — Z79899 Other long term (current) drug therapy: Secondary | ICD-10-CM | POA: Diagnosis not present

## 2023-11-13 MED ORDER — MUPIROCIN 2 % EX OINT: 1.0000 | TOPICAL_OINTMENT | Freq: Two times a day (BID) | CUTANEOUS | 0 refills | Status: AC

## 2023-11-13 MED ORDER — CEFADROXIL 500 MG PO CAPS: 500.0000 mg | ORAL_CAPSULE | Freq: Two times a day (BID) | ORAL | 0 refills | Status: AC

## 2023-11-13 MED ORDER — MUPIROCIN 2 % EX OINT: TOPICAL_OINTMENT | Freq: Two times a day (BID) | CUTANEOUS | Status: DC

## 2023-11-13 NOTE — ED Triage Notes (Signed)
Pt via pov from home with wound on left heel x 10 days. Pt has been treating it at home and trying to air it out at home, but feels like it isnt' healing. 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 Pt alert  & oriented, nad noted.

## 2023-11-13 NOTE — ED Provider Notes (Signed)
 [facility] AT [facility] Provider Note   CSN: [id number] Arrival date & time: 11/13/23  1211     History  Chief Complaint  Patient presents with   Wound Check    Caitlin Sharp is a 73 y.o. female.   Wound Check   73 year old female presents emergency department with complaints of wound on the inside of left heel.  States been present for about 2 weeks.  States that she initially rubbed the area with slippers that were not fitting her well causing a blister.  States the blister ruptured now she has shallow wound.  Describes some redness around the area.  History of diabetes and states her sugars have been in the 1 and 200s but is concerned about developing infection.  Did have right BKA due to complications from wound.  Has not been on antibiotics and has not followed with any provider in the outpatient setting regarding current wound.  Denies any fever, chills, weakness/sensory deficits from baseline in affected extremity.  Past medical history significant for NSTEMI, diabetes mellitus type 1, DKA, bypass surgery, diabetic retinopathy, kidney stone, left bundle branch block, mitral regurgitation, osteoporosis, NSVT, macular edema, osteomyelitis right foot  Home Medications Prior to Admission medications   Medication Sig Start Date End Date Taking? Authorizing Provider  cefadroxil (DURICEF) 500 MG capsule Take 1 capsule (500 mg total) by mouth 2 (two) times daily. 11/13/23  Yes Sherian Maroon A, PA  mupirocin ointment (BACTROBAN) 2 % Apply 1 Application topically 2 (two) times daily. 11/13/23  Yes Sherian Maroon A, PA  solifenacin (VESICARE) 5 MG tablet Take 5 mg by mouth daily. 10/13/23 10/12/24 Yes [provider]  valACYclovir (VALTREX) 1000 MG tablet Take 1,000 mg by mouth 3 (three) times daily. 10/24/23  Yes [provider]  acetaminophen (TYLENOL) 500 MG tablet Take 1,000 mg by mouth 2 (two) times daily as needed for moderate pain.     [provider]  atorvastatin (LIPITOR) 40 MG tablet Take 40 mg by mouth at bedtime.    [provider]  Biotin w/ Vitamins C & E (HAIR/SKIN/NAILS PO) Take 1 tablet by mouth in the morning.    [provider]  calcium carbonate (OSCAL) 1500 (600 Ca) MG TABS tablet Take 600 mg of elemental calcium by mouth daily.    [provider]  clopidogrel (PLAVIX) 75 MG tablet Take 75 mg by mouth in the morning.    [provider]  Continuous Blood Gluc Sensor (FREESTYLE LIBRE 14 DAY SENSOR) MISC Inject 1 sensor to the skin every 14 days for continuous glucose monitoring. 08/19/21   [provider]  cyanocobalamin (VITAMIN B12) 1000 MCG tablet Take 1,000 mcg by mouth daily.    [provider]  escitalopram (LEXAPRO) 5 MG tablet Take 1 tablet (5 mg total) by mouth daily. 06/12/23   Fletcher Anon, NP  glucosamine-chondroitin 500-400 MG tablet Take 1 tablet by mouth every morning.    [provider]  glucose 4 GM chewable tablet Chew 1 tablet by mouth as needed. Give one tablet for blood sugar < 70, may repeat every 15 mins until blood glucose > 70, may give with juice and other foods.    [provider]  insulin glargine (LANTUS SOLOSTAR) 100 UNIT/ML Solostar Pen Inject 13 Units into the skin daily. In the morning. PATIENT SELF ADMINISTERS    [provider]  insulin lispro (HUMALOG) 100 UNIT/ML KwikPen Inject 5-15 Units into the skin See admin instructions. If  blood sugar is 0-80= 0 units,  81-120= 3 units, 121-150 = 4 units, 151-200= 6 units, 201-250 = 7 units, 251-300 = 8 units, 301-350= 9 units, 351-400= 10 units, 401-500= 12 units, >500 give 14 units.  May given 5 units every day prn if SSI not effective.    [provider]  latanoprost (XALATAN) 0.005 % ophthalmic solution Place 1 drop into both eyes every evening.    [provider]  lisinopril (ZESTRIL) 2.5 MG tablet Take 2.5 mg by mouth in the  morning.    [provider]  Multiple Vitamins-Minerals (I-VITE PO) Take 1 tablet by mouth in the morning.    [provider]  nitroGLYCERIN (NITROSTAT) 0.4 MG SL tablet Place 0.4 mg under the tongue every 5 (five) minutes as needed for chest pain.    [provider]  Omega-3 Fatty Acids (FISH OIL) 1000 MG CAPS Take 1,000 mg by mouth in the morning.    [provider]  omeprazole (PRILOSEC) 20 MG capsule Take 20 mg by mouth daily.    [provider]  sennosides-docusate sodium (SENOKOT-S) 8.6-50 MG tablet Take 1 tablet by mouth every other day. IN THE MORNING    [provider]  timolol (TIMOPTIC) 0.5 % ophthalmic solution Place 1 drop into the left eye 2 (two) times daily. 03/25/21   [provider]      Allergies    Penicillins and Wellbutrin [bupropion]    Review of Systems   Review of Systems  All other systems reviewed and are negative.   Physical Exam Updated Vital Signs BP (!) 137/58 (BP Location: Left Arm)   Pulse (!) 51   Temp 98 F (36.7 C)   Resp 16   Ht 5\' 8"  (1.727 m)   Wt 59 kg   SpO2 94%   BMI 19.77 kg/m  Physical Exam Vitals and nursing note reviewed.  Constitutional:      General: She is not in acute distress.    Appearance: She is well-developed.  HENT:     Head: Normocephalic and atraumatic.  Eyes:     Conjunctiva/sclera: Conjunctivae normal.  Cardiovascular:     Rate and Rhythm: Normal rate and regular rhythm.  Pulmonary:     Effort: Pulmonary effort is normal. No respiratory distress.     Breath sounds: Normal breath sounds.  Abdominal:     Palpations: Abdomen is soft.     Tenderness: There is no abdominal tenderness.  Musculoskeletal:        General: No swelling.     Cervical back: Neck supple.     Comments: Full range of motion ankle and digits of left foot.  Shallow ulcerative wound on the inside of left heel measuring about 1.2 centimeters in diameter..  Some surrounding erythema  which has palpable warmth to it.  No expressible drainage.  No obvious bony tenderness.  No ability to "probe to bone".  Skin:    General: Skin is warm and dry.     Capillary Refill: Capillary refill takes less than 2 seconds.  Neurological:     Mental Status: She is alert.  Psychiatric:        Mood and Affect: Mood normal.     ED Results / Procedures / Treatments   Labs (all labs ordered are listed, but only abnormal results are displayed) Labs Reviewed - No data to display  EKG None  Radiology DG Foot Complete Left Result Date: 11/13/2023 CLINICAL DATA:  Foot pain. EXAM: LEFT FOOT -  COMPLETE 3+ VIEW COMPARISON:  None Available. FINDINGS: No acute fracture or dislocation. No aggressive osseous lesion. Diffuse mild degenerative changes of the imaged joints with asymmetric moderate-to-severe involvement of midfoot joints. No focal soft tissue swelling. No radiopaque foreign bodies. IMPRESSION: No acute osseous abnormality of the left foot. Electronically Signed   By: Jules Schick M.D.   On: 11/13/2023 13:59    Procedures Procedures    Medications Ordered in ED Medications  mupirocin ointment (BACTROBAN) 2 % ( Nasal Given 11/13/23 1415)    ED Course/ Medical Decision Making/ A&P Clinical Course as of 11/13/23 1440  Fri Nov 13, 2023  1410 Per COVID pharmacy, patient had received Ancef, cefepime and been on cefdinir in the outpatient setting without difficulty.  Will trial cefadroxil. [CR]    Clinical Course User Index [CR] Peter Garter, PA                                 Medical Decision Making Amount and/or Complexity of Data Reviewed Radiology: ordered.  Risk Prescription drug management.   This patient presents to the ED for concern of wound, this involves an extensive number of treatment options, and is a complaint that carries with it a high risk of complications and morbidity.  The differential diagnosis includes cellulitis, erysipelas, abscess, necrotizing  infection, osteomyelitis, other   Co morbidities that complicate the patient evaluation  See HPI   Additional history obtained:  Additional history obtained from EMR External records from outside source obtained and reviewed including hospital records   Lab Tests:  N/a   Imaging Studies ordered:  I ordered imaging studies including left foot x-ray  I independently visualized and interpreted imaging which showed no acute abnormality. I agree with the radiologist interpretation   Cardiac Monitoring: / EKG:  The patient was maintained on a cardiac monitor.  I personally viewed and interpreted the cardiac monitored which showed an underlying rhythm of: sinus rhythm   Consultations Obtained:  N/a   Problem List / ED Course / Critical interventions / Medication management  Ulcer on foot, cellulitis Reevaluation of the patient showed that the patient stayed the same I have reviewed the patients home medicines and have made adjustments as needed   Social Determinants of Health:  Denies tobacco, illicit drug use.   Test / Admission - Considered:  Ulcer on foot, cellulitis Vitals signs within normal range and stable throughout visit. Laboratory/imaging studies significant for: See above 73 year old female presents emergency department with complaints of ulcer inside of left heel.  Area present for the past 10 to 14 days after suffering irritation from her footwear.  On exam, shallow ulcer medial aspect of left heel.  No ability to probe to bone.  Some surrounding cellulitic skin changes.  X-ray obtained which was negative for any acute abnormality.  Patient without any systemic signs of illness with normal vital signs.  Patient with great pulses to left foot; low suspicion for ischemic limb.  No appreciable abscess on exam requiring drainage.  Will trial local wound care at home, topical antibiotics as well as oral antibiotics with ambulatory referral to wound care given  patient's history of similar wound on right lower extremity leading to BKA.  Further workup deemed unnecessary at this time while in the ED.  Treatment plan discussed with patient and she acknowledged understanding was agreeable to said plan.  Patient overall well-appearing, afebrile in no acute distress. Worrisome signs  and symptoms were discussed with the patient, and the patient acknowledged understanding to return to the ED if noticed. Patient was stable upon discharge.          Final Clinical Impression(s) / ED Diagnoses Final diagnoses:  Ulcer of left foot, unspecified ulcer stage (HCC)    Rx / DC Orders ED Discharge Orders          Ordered    Ambulatory referral to Wound Clinic       Comments: Diabetic ulcer foot   11/13/23 1409    cefadroxil (DURICEF) 500 MG capsule  2 times daily        11/13/23 1409    mupirocin ointment (BACTROBAN) 2 %  2 times daily        11/13/23 1409              Peter Garter, [place] 11/13/23 1440    Anders Simmonds T, DO 11/15/23 [phone]

## 2023-11-13 NOTE — Discharge Instructions (Signed)
As discussed, will place on antibiotics given surrounding redness.  They are washing area at least once daily with warm soapy water with bandage changes.  Also recommend topical antibiotic ointment for mupirocin.  Have sent a referral for wound clinic to be seen in the outpatient setting.  You should hear from them sometime next week.  Please do not hesitate to return to emergency department if you develop worsening redness, swelling, fever, pus drainage.

## 2023-12-14 ENCOUNTER — Other Ambulatory Visit: Payer: Self-pay

## 2023-12-14 DIAGNOSIS — I739 Peripheral vascular disease, unspecified: Secondary | ICD-10-CM

## 2023-12-23 NOTE — Progress Notes (Deleted)
 Office Note   Left heel wound  HPI: Caitlin Sharp is a 73 y.o. (Mar 29, 1951) female presenting in follow-up with a left heel wound.  Caitlin Sharp is known to my practice having previously undergone bilateral lower extremity angiogram in 2023 for nonhealing right first toe amputation site.  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   Originally from [place], she moved to [place] several months ago the request of her son who lives in town.  Initially lived independently but had an episode of DKA that was stress related.  She is currently living in assisted living for nighttime blood glucose checks.  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  At that time, she is try to maintain healthy lifestyle counting carbs and exercising daily.  Her A1c has been atypically high recently due to lack of exercise.  On today's exam, ***  The pt is on a statin for cholesterol management.  The pt is  on a daily aspirin.   Other AC:  plavix The pt is not on medication for hypertension.   The pt is  diabetic.  Tobacco hx:  -  Past Medical History:  Diagnosis Date   Abnormal thyroid blood test    Per Records recevied from [facility], Dr.Pearson   Acute deep vein thrombosis of right peroneal vein (HCC) 10/05/2020   RLE DVT   Anxiety    Arthritis    RA   Coronary artery disease    Diabetes type I [facility])    Per [facility] New Patient Packet   Diabetic retinopathy (HCC)    Per [facility] New Patient Packet   DKA (diabetic ketoacidosis) (HCC) 2021   Dr.William Zackowski.  Insulin Pump was out of Insulin   Glaucoma    Per [facility] New Patient Packet   H/O heart bypass surgery 2012   Dr. Excell Seltzer, Per [facility] New Patient Packet   Heart murmur    when I was a teenager   History of blood transfusion    History of kidney stones    passed   Hx of CABG    Per records received form [facility], Dr.Pearson     Left bundle branch block    Mild mitral regurgitation    Per records from [facility], Dr.Pearson    Neuropathy 1976   Per [facility] New Patient Packet   NSTEMI (non-ST elevated myocardial infarction) (HCC) 09/21/2020   NSTEMI/demand ischemic in setting of DKA, patent grafts by 09/24/20 [facility], continue medical therapy   Osteoporosis    T Score -2.7 , Per [facility] New Patient Packet   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 due to underlying condition [facility])    Per Records recevied from [facility], Dr.Pearson   PVD (peripheral vascular disease) (HCC) 10/27/2021   Type 1 diabetes mellitus with hyperglycemia (HCC) 10/28/2021    Past Surgical History:  Procedure Laterality Date   ABDOMINAL AORTOGRAM W/LOWER EXTREMITY N/A 10/02/2021   Procedure: ABDOMINAL AORTOGRAM W/LOWER EXTREMITY;  Surgeon: Victorino Sparrow, MD;  Location[facility] INVASIVE CV LAB;  Service: Cardiovascular;  Laterality: N/A;   AMPUTATION Right 04/17/2021   Procedure: RIGHT GREAT TOE AMPUTATION;  Surgeon: [name], MD;  Location: [facility] OR;  Service: Orthopedics;  Laterality: Right;   AMPUTATION Right 07/19/2021   Procedure: RIGHT FOOT 1ST RAY AMPUTATION;  Surgeon: [name], MD;  Location: [facility] OR;  Service: Orthopedics;  Laterality: Right;   AMPUTATION Right 02/06/2022   Procedure: AMPUTATION BELOW KNEE;  Surgeon: Toni Arthurs, MD;  Location: [facility] OR;  Service: Orthopedics;  Laterality: Right;  regional block   CESAREAN SECTION  1970   Dr. Aquilla Hacker, Per [facility] New Patient Packet   CESAREAN SECTION  1973   Dr. Aquilla Hacker, Per [facility] New Patient Packet   CORONARY ARTERY BYPASS GRAFT      Social History   Socioeconomic History   Marital status: Widowed    Spouse name: Not on file   Number of children: Not on file   Years of education: Not on file   Highest education level: Not on file  Occupational History   Not on file  Tobacco Use   Smoking status: Never   Smokeless tobacco: Never  Vaping  Use   Vaping status: Never Used  Substance and Sexual Activity   Alcohol use: Never   Drug use: Never   Sexual activity: Not on file  Other Topics Concern   Not on file  Social History Narrative   Per [facility] New Patient Packet Abstracted on 02/14/2021      Diet: Left blank       Caffeine: Yes      Married, if yes what year: Widowed, married in 1969      Do you live in a house, apartment, assisted living, condo, trailer, ect: Assisted Living       Is it one or more stories: 2      How many persons live in your home?One      Pets:No      Highest level or education completed:       Current/Past profession: Accountant       Exercise:        Yes          Type and how often: Walking 3-4 time weekly          Living Will: Yes   DNR: No   POA/HPOA: Yes      Social Drivers of Corporate investment banker Strain: Not on file  Food Insecurity: Low Risk  (10/19/2023)   Received from [facility]   Hunger Vital Sign    Worried About Running Out of Food in the Last Year: Never true    Ran Out of Food in the Last Year: Never true  Transportation Needs: No Transportation Needs (10/19/2023)   Received from [employer]    In the past 12 months, has lack of reliable transportation kept you from medical appointments, meetings, work or from getting things needed for daily living? : No  Physical Activity: Not on file  Stress: Not on file  Social Connections: Not on file  Intimate Partner Violence: Not on file    Family History  Problem Relation Age of Onset   Congestive Heart Failure Mother        Per [facility] New Patient Packet   Arthritis Mother        Per [facility] New Patient Packet   Osteoporosis Mother        Per [facility] New Patient Packet   Heart attack Father        Per [facility] New Patient Packet   Diabetes Sister        Per [facility] New Patient Packet   Emphysema Brother        Per [facility] New Patient Packet   Heart disease Brother        Per  [facility] New Patient Packet   Sleep  apnea Neg Hx    Alzheimer's disease Neg Hx    Dementia Neg Hx     Current Outpatient Medications  Medication Sig Dispense Refill   acetaminophen (TYLENOL) 500 MG tablet Take 1,000 mg by mouth 2 (two) times daily as needed for moderate pain.     atorvastatin (LIPITOR) 40 MG tablet Take 40 mg by mouth at bedtime.     Biotin w/ Vitamins C & E (HAIR/SKIN/NAILS PO) Take 1 tablet by mouth in the morning.     calcium carbonate (OSCAL) 1500 (600 Ca) MG TABS tablet Take 600 mg of elemental calcium by mouth daily.     cefadroxil (DURICEF) 500 MG capsule Take 1 capsule (500 mg total) by mouth 2 (two) times daily. 12 capsule 0   clopidogrel (PLAVIX) 75 MG tablet Take 75 mg by mouth in the morning.     Continuous Blood Gluc Sensor (FREESTYLE LIBRE 14 DAY SENSOR) MISC Inject 1 sensor to the skin every 14 days for continuous glucose monitoring.     cyanocobalamin (VITAMIN B12) 1000 MCG tablet Take 1,000 mcg by mouth daily.     escitalopram (LEXAPRO) 5 MG tablet Take 1 tablet (5 mg total) by mouth daily.     glucosamine-chondroitin 500-400 MG tablet Take 1 tablet by mouth every morning.     glucose 4 GM chewable tablet Chew 1 tablet by mouth as needed. Give one tablet for blood sugar < 70, may repeat every 15 mins until blood glucose > 70, may give with juice and other foods.     insulin glargine (LANTUS SOLOSTAR) 100 UNIT/ML Solostar Pen Inject 13 Units into the skin daily. In the morning. PATIENT SELF ADMINISTERS     insulin lispro (HUMALOG) 100 UNIT/ML KwikPen Inject 5-15 Units into the skin See admin instructions. If blood sugar is 0-80= 0 units,  81-120= 3 units, 121-150 = 4 units, 151-200= 6 units, 201-250 = 7 units, 251-300 = 8 units, 301-350= 9 units, 351-400= 10 units, 401-500= 12 units, >500 give 14 units.  May given 5 units every day prn if SSI not effective.     latanoprost (XALATAN) 0.005 % ophthalmic solution Place 1 drop into both eyes every evening.     lisinopril (ZESTRIL) 2.5 MG tablet Take  2.5 mg by mouth in the morning.     Multiple Vitamins-Minerals (I-VITE PO) Take 1 tablet by mouth in the morning.     mupirocin ointment (BACTROBAN) 2 % Apply 1 Application topically 2 (two) times daily. 22 g 0   nitroGLYCERIN (NITROSTAT) 0.4 MG SL tablet Place 0.4 mg under the tongue every 5 (five) minutes as needed for chest pain.     Omega-3 Fatty Acids (FISH OIL) 1000 MG CAPS Take 1,000 mg by mouth in the morning.     omeprazole (PRILOSEC) 20 MG capsule Take 20 mg by mouth daily.     sennosides-docusate sodium (SENOKOT-S) 8.6-50 MG tablet Take 1 tablet by mouth every other day. IN THE MORNING     solifenacin (VESICARE) 5 MG tablet Take 5 mg by mouth daily.     timolol (TIMOPTIC) 0.5 % ophthalmic solution Place 1 drop into the left eye 2 (two) times daily.     valACYclovir (VALTREX) 1000 MG tablet Take 1,000 mg by mouth 3 (three) times daily.     No current facility-administered medications for this visit.    Allergies  Allergen Reactions   Penicillins Anaphylaxis   Wellbutrin [Bupropion] Rash  REVIEW OF SYSTEMS:   [X]  denotes positive finding, [ ]  denotes negative finding Cardiac  Comments:  Chest pain or chest pressure:    Shortness of breath upon exertion:    Short of breath when lying flat:    Irregular heart rhythm:        Vascular    Pain in calf, thigh, or hip brought on by ambulation:    Pain in feet at night that wakes you up from your sleep:     Blood clot in your veins:    Leg swelling:         Pulmonary    Oxygen at home:    Productive cough:     Wheezing:         Neurologic    Sudden weakness in arms or legs:     Sudden numbness in arms or legs:     Sudden onset of difficulty speaking or slurred speech:    Temporary loss of vision in one eye:     Problems with dizziness:         Gastrointestinal    Blood in stool:     Vomited blood:         Genitourinary    Burning when urinating:     Blood in urine:        Psychiatric    Major depression:          Hematologic    Bleeding problems:    Problems with blood clotting too easily:        Skin    Rashes or ulcers:        Constitutional    Fever or chills:      PHYSICAL EXAMINATION:  There were no vitals filed for this visit.  General:  WDWN in NAD; vital signs documented above Gait: Not observed HENT: WNL, normocephalic Pulmonary: normal non-labored breathing , without wheezing Cardiac: regular HR,  Abdomen: soft, NT, no masses Skin: without rashes Vascular Exam/Pulses:  Right Left  Radial 2+ (normal) 2+ (normal)  Ulnar 2+ (normal) 2+ (normal)  Femoral 2+ (normal) 2+ (normal)  Popliteal    DP    PT 2+ (normal) 2+ (normal)   Extremities: without ischemic changes, without Gangrene , without cellulitis; with open wounds;  Musculoskeletal: no muscle wasting or atrophy  Neurologic: A&O X 3;  No focal weakness or paresthesias are detected Psychiatric:  The pt has Normal affect.   Non-Invasive Vascular Imaging:   ABI Findings:  +---------+------------------+-----+--------------------------+------------  -+  Right    Rt Pressure (mmHg)IndexWaveform                  Comment         +---------+------------------+-----+--------------------------+------------  -+  Brachial 159                                                               +---------+------------------+-----+--------------------------+------------  -+  PTA      157               0.99 monophasic                                 +---------+------------------+-----+--------------------------+------------  -+  DP  255               1.60 monophasic                                 +---------+------------------+-----+--------------------------+------------  -+  Great Toe                       2nd digit waveform presentGT  amputation  +---------+------------------+-----+--------------------------+------------  -+   +---------+------------------+-----+---------+-------+   Left     Lt Pressure (mmHg)IndexWaveform Comment  +---------+------------------+-----+---------+-------+  Brachial 159                                      +---------+------------------+-----+---------+-------+  PTA      255               1.60 triphasic         +---------+------------------+-----+---------+-------+  DP       255               1.60 biphasic          +---------+------------------+-----+---------+-------+  Caitlin Sharp               0.72                   +---------+------------------+-----+---------+-------+     ASSESSMENT/PLAN: CHARDAI GANGEMI is a 73 y.o. female presenting with left heel wound.     Patient's ABIs were reviewed demonstrating monophasic waveforms with likely falsely elevated ABIs from longstanding diabetes.  Physical exam was notable for palpable posterior tibial pulses bilaterally  The patient's nidus for poor wound healing is likely due to her longstanding diabetes (last A1C 10.3).  Longstanding, poorly controlled diabetes, leads to small vessel occlusion, which is usually not amenable to endovascular intervention.  Being that the patient has gone 3 months and is yet to heal her amputation site, I have offered right lower extremity angiogram to define and hopefully improve right lower extremity blood flow. After discussing the risks and benefits of the above, Caitlin Sharp elected to proceed.   Victorino Sparrow, MD [facility] [phone]

## 2023-12-24 ENCOUNTER — Ambulatory Visit: Payer: Medicare Other | Admitting: Vascular Surgery

## 2023-12-24 ENCOUNTER — Ambulatory Visit (HOSPITAL_COMMUNITY): Payer: Medicare Other
